# Patient Record
Sex: Male | Born: 1955 | Race: Black or African American | Hispanic: No | Marital: Married | Smoking: Former smoker
Health system: Southern US, Community
[De-identification: ages and names within clinical notes are randomized; demographics above are authoritative.]

## PROBLEM LIST (undated history)

## (undated) DIAGNOSIS — N433 Hydrocele, unspecified: Secondary | ICD-10-CM

## (undated) DIAGNOSIS — E785 Hyperlipidemia, unspecified: Secondary | ICD-10-CM

## (undated) DIAGNOSIS — K602 Anal fissure, unspecified: Secondary | ICD-10-CM

## (undated) DIAGNOSIS — J449 Chronic obstructive pulmonary disease, unspecified: Secondary | ICD-10-CM

## (undated) DIAGNOSIS — I10 Essential (primary) hypertension: Secondary | ICD-10-CM

## (undated) DIAGNOSIS — E119 Type 2 diabetes mellitus without complications: Secondary | ICD-10-CM

## (undated) DIAGNOSIS — T7840XA Allergy, unspecified, initial encounter: Secondary | ICD-10-CM

## (undated) DIAGNOSIS — K589 Irritable bowel syndrome without diarrhea: Secondary | ICD-10-CM

## (undated) HISTORY — DX: Anal fissure, unspecified: K60.2

## (undated) HISTORY — PX: HERNIA REPAIR: SHX51

## (undated) HISTORY — DX: Chronic obstructive pulmonary disease, unspecified: J44.9

## (undated) HISTORY — DX: Allergy, unspecified, initial encounter: T78.40XA

## (undated) HISTORY — PX: VASECTOMY: SHX75

## (undated) HISTORY — DX: Essential (primary) hypertension: I10

## (undated) HISTORY — DX: Hydrocele, unspecified: N43.3

---

## 1997-08-29 ENCOUNTER — Emergency Department (HOSPITAL_COMMUNITY): Admission: EM | Admit: 1997-08-29 | Discharge: 1997-08-29 | Payer: Self-pay | Admitting: Emergency Medicine

## 2000-12-25 ENCOUNTER — Emergency Department (HOSPITAL_COMMUNITY): Admission: EM | Admit: 2000-12-25 | Discharge: 2000-12-25 | Payer: Self-pay

## 2000-12-25 ENCOUNTER — Encounter: Payer: Self-pay | Admitting: Emergency Medicine

## 2001-02-08 ENCOUNTER — Encounter: Admission: RE | Admit: 2001-02-08 | Discharge: 2001-02-08 | Payer: Self-pay | Admitting: Family Medicine

## 2001-02-08 ENCOUNTER — Encounter: Payer: Self-pay | Admitting: Family Medicine

## 2001-03-28 ENCOUNTER — Emergency Department (HOSPITAL_COMMUNITY): Admission: EM | Admit: 2001-03-28 | Discharge: 2001-03-28 | Payer: Self-pay | Admitting: Emergency Medicine

## 2001-03-28 ENCOUNTER — Encounter: Payer: Self-pay | Admitting: Emergency Medicine

## 2001-12-10 ENCOUNTER — Encounter: Admission: RE | Admit: 2001-12-10 | Discharge: 2001-12-10 | Payer: Self-pay | Admitting: *Deleted

## 2002-02-22 ENCOUNTER — Emergency Department (HOSPITAL_COMMUNITY): Admission: EM | Admit: 2002-02-22 | Discharge: 2002-02-22 | Payer: Self-pay | Admitting: Emergency Medicine

## 2002-02-22 ENCOUNTER — Encounter: Payer: Self-pay | Admitting: Emergency Medicine

## 2011-07-10 ENCOUNTER — Other Ambulatory Visit: Payer: Self-pay | Admitting: Internal Medicine

## 2011-07-13 ENCOUNTER — Other Ambulatory Visit: Payer: Self-pay | Admitting: Internal Medicine

## 2011-07-18 ENCOUNTER — Ambulatory Visit: Payer: Self-pay | Admitting: Family Medicine

## 2011-07-18 VITALS — BP 165/93 | HR 66 | Temp 98.2°F | Resp 18 | Ht 68.0 in | Wt 223.0 lb

## 2011-07-18 DIAGNOSIS — I10 Essential (primary) hypertension: Secondary | ICD-10-CM

## 2011-07-18 DIAGNOSIS — E1159 Type 2 diabetes mellitus with other circulatory complications: Secondary | ICD-10-CM | POA: Insufficient documentation

## 2011-07-18 DIAGNOSIS — B356 Tinea cruris: Secondary | ICD-10-CM

## 2011-07-18 DIAGNOSIS — N433 Hydrocele, unspecified: Secondary | ICD-10-CM

## 2011-07-18 DIAGNOSIS — I152 Hypertension secondary to endocrine disorders: Secondary | ICD-10-CM | POA: Insufficient documentation

## 2011-07-18 LAB — POCT GLYCOSYLATED HEMOGLOBIN (HGB A1C): Hemoglobin A1C: 5.7

## 2011-07-18 MED ORDER — LISINOPRIL 40 MG PO TABS: 40.0000 mg | ORAL_TABLET | Freq: Every day | ORAL | Status: DC

## 2011-07-18 NOTE — Patient Instructions (Signed)
Use over-the-counter Lotrimin cream daily for the groin rash.  Hydrocele, Adult Fluid can collect around the testicles. This fluid forms in a sac. This condition is called a hydrocele. The collected fluid causes swelling of the scrotum. Usually, it affects just one testicle. Most of the time, the condition does not cause pain. Sometimes, the hydrocele goes away on its own. Other times, surgery is needed to get rid of the fluid. CAUSES A hydrocele does not develop often. Different things can cause a hydrocele in a man, including:  Injury to the scrotum.   Infection.   X-ray of the area around the scrotum.   A tumor or cancer of the testicle.   Twisting of a testicle.   Decreased blood flow to the scrotum.  SYMPTOMS   Swelling without pain. The hydrocele feels like a water-filled balloon.   Swelling with pain. This can occur if the hydrocele was caused by infection or twisting.   Mild discomfort in the scrotum.   The hydrocele may feel heavy.   Swelling that gets smaller when you lie down.  DIAGNOSIS  Your caregiver will do a physical exam to decide if you have a hydrocele. This may include:  Asking questions about your overall health, today and in the past. Your caregiver may ask about any injuries, X-rays, or infections.   Pushing on your abdomen or asking you to change positions to see if the size of the hydrocele changes.   Shining a light through the scrotum (transillumination) to see if the fluid inside the scrotum is clear.   Blood tests and urine tests to check for infection.   Imaging studies that take pictures of the scrotum and testicles.  TREATMENT  Treatment depends in part on what caused the condition. Options include:  Watchful waiting. Your caregiver checks the hydrocele every so often.   Different surgeries to drain the fluid.   A needle may be put into the scrotum to drain fluid (needle aspiration). Fluid often returns after this type of treatment.    A cut (incision) may be made in the scrotum to remove the fluid sac (hydrocelectomy).   An incision may be made in the groin to repair a hydrocele that has contact with abdominal fluids (communicating hydrocele).   Medicines to treat an infection (antibiotics).  HOME CARE INSTRUCTIONS  What you need to do at home may depend on the cause of the hydrocele and type of treatment. In general:  Take all medicine as directed by your caregiver. Follow the directions carefully.   Ask your caregiver if there is anything you should not do while you recover (activities, lifting, work, sex).   If you had surgery to repair a communicating hydrocele, recovery time may vary. Ask you caregiver about your recovery time.   Avoid heavy lifting for 4 to 6 weeks.   If you had an incision on the scrotum or groin, wash it for 2 to 3 days after surgery. Do this as long as the skin is closed and there are no gaps in the wound. Wash gently, and avoid rubbing the incision.   Keep all follow-up appointments.  SEEK MEDICAL CARE IF:   Your scrotum seems to be getting larger.   The area becomes more and more uncomfortable.  SEEK IMMEDIATE MEDICAL CARE IF:  You have a fever. Document Released: 09/13/2009 Document Revised: 03/15/2011 Document Reviewed: 09/13/2009 [facility] Patient Information 2012 [place], [place].

## 2011-07-18 NOTE — Progress Notes (Signed)
Subjective:  Patient is here for a followup with regard to his high blood pressure and refill of his medication which has run out. The blood pressure is high today, but he says it is because he has been out for one week. He runs a small trucking firm, and travels a lot. He was not in here to get refill.  His hydrocele continues to grow some, and he feels like he needs to go ahead and see a urologist it checked.  The hydrocele causes sweating in his groin region, so he has developed a rash that comes and goes around the sides of the scrotum, medial thighs, and up his crack. He has used diaper rash meds with variable success  Dictated: Afro-American male in no acute distress at this time. Throat clear. Neck supple without nodes or thyromegaly. Chest clear to. Heart regular without murmurs rubs or redness. Evidence of mass or tenderness. Medium sized left hydrocele, small tennis ball. No tenderness. No hernia. Has a little intertrigo-like rash along her left groin and a crack.  Assessment: Hypertension, under satisfactory control secondary to being out of medications Overweight Intertrigo rash Hydrocele  Plan Will refer to urology Refilled blood pressure medicine Use OTC Lotrimin cream. Check blood sugar Results for orders placed in visit on 07/18/11  POCT GLYCOSYLATED HEMOGLOBIN (HGB A1C)      Component Value Range   Hemoglobin A1C 5.7

## 2011-10-08 DIAGNOSIS — Z0271 Encounter for disability determination: Secondary | ICD-10-CM

## 2011-11-22 ENCOUNTER — Telehealth: Payer: Self-pay

## 2011-11-22 NOTE — Telephone Encounter (Signed)
Maxine from [facility] called to see if we received faxed record request. Request in your box. She has some questions. Please call her back at [phone] ext [phone].  Case # [id number].

## 2011-12-31 ENCOUNTER — Ambulatory Visit: Payer: Self-pay | Admitting: Family Medicine

## 2011-12-31 VITALS — BP 160/90 | HR 73 | Temp 98.2°F | Resp 16 | Ht 68.25 in | Wt 226.4 lb

## 2011-12-31 DIAGNOSIS — I10 Essential (primary) hypertension: Secondary | ICD-10-CM

## 2011-12-31 DIAGNOSIS — N476 Balanoposthitis: Secondary | ICD-10-CM

## 2011-12-31 DIAGNOSIS — Z0289 Encounter for other administrative examinations: Secondary | ICD-10-CM

## 2011-12-31 DIAGNOSIS — N481 Balanitis: Secondary | ICD-10-CM

## 2011-12-31 DIAGNOSIS — Z024 Encounter for examination for driving license: Secondary | ICD-10-CM

## 2011-12-31 NOTE — Progress Notes (Signed)
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 He is doing fairly well. No major problems. He had a physical recently his blood pressure was very good then.  Objective: Moderately overweight Afro-American male no acute distress. His TMs are normal. Eyes PERRLA. Throat clear. Neck supple without nodes thyromegaly. No carotid bruits. Chest clear. Heart regular without murmurs. Abdomen soft without masses or tenderness. Normal male external genitalia. Has a little monilial dermatitis on his penis under the foreskin. He has scrotum has bilateral heart chills. Extremities unremarkable.  Assessment: DOTf physical normal Hypertension Monilia balanitis  Plan: The form was completed.  Told to  use Diflucan 150 mg and for his wife to take one. Lotrimin

## 2012-05-19 ENCOUNTER — Ambulatory Visit: Payer: Self-pay | Admitting: Family Medicine

## 2012-05-19 VITALS — BP 179/98 | HR 71 | Temp 97.5°F | Resp 18 | Ht 68.0 in | Wt 228.0 lb

## 2012-05-19 DIAGNOSIS — L089 Local infection of the skin and subcutaneous tissue, unspecified: Secondary | ICD-10-CM

## 2012-05-19 DIAGNOSIS — L723 Sebaceous cyst: Secondary | ICD-10-CM

## 2012-05-19 DIAGNOSIS — I10 Essential (primary) hypertension: Secondary | ICD-10-CM

## 2012-05-19 DIAGNOSIS — J029 Acute pharyngitis, unspecified: Secondary | ICD-10-CM

## 2012-05-19 DIAGNOSIS — K449 Diaphragmatic hernia without obstruction or gangrene: Secondary | ICD-10-CM

## 2012-05-19 MED ORDER — OMEPRAZOLE 40 MG PO CPDR: 40.0000 mg | DELAYED_RELEASE_CAPSULE | Freq: Every day | ORAL | Status: DC

## 2012-05-19 MED ORDER — HYDROCODONE-ACETAMINOPHEN 7.5-325 MG/15ML PO SOLN: 10.0000 mL | Freq: Four times a day (QID) | ORAL | Status: DC | PRN

## 2012-05-19 MED ORDER — CLINDAMYCIN HCL 300 MG PO CAPS: 300.0000 mg | ORAL_CAPSULE | Freq: Four times a day (QID) | ORAL | Status: DC

## 2012-05-19 NOTE — Progress Notes (Signed)
Subjective:    Patient ID: Larry Dawson, male    DOB: October 14, 1955, 57 y.o.   MRN: [id number] Chief Complaint  Patient presents with  . URI    x 2 weeks and also  luq empty feeling like an ache    HPI Larry Dawson has had URI sxs for the past 2 wks. Several days ago he thought he was getting better but was then exposed to some extreme temperature changes and pharyngitis and throat swelling returned dramatically worse.  last night he did not sleep AT ALL as his uvula swells at night and when he lays back he feels like he can't breathe - swells really large and to get it to go down will hold a bc powder in the back of his mouth. So basically hasn't slept at all in 3d due to sxs. Throat pain is "killing" him.  Taking otc off-brand nyquil also. No longer with sig nasal cong and cough resolved.  No fevers but has had A LOT of sweats.  When he gets hungry he feels the typical hunger pangs but they seem to be coming from his left chest and hears his stomach growl from his left chest for the past several mos.  For the past yr, he has been experiencing so mild early satiety with regurg if he eats to much. Ate about 7 hrs ago and now hearing left chest sounds and having hunger pangs in his left chest.  Reports his BP is likely up because he has not slept in 3d. His daughters are CNAs who check his BP regularly and usu runs 130s/80s.  Incidentally on exam, I saw an inflammed nodule that due to the central pin-point postule I suspect is an infected/inflammed sebaceous cyst.  Pt states that this just developed within the past 2-3 hrs - did not have ANY lesion there prior today - just all of a sudden started to swell up and become painful. Pt has never had anything like this prior, has never needed an I&D.  Past Medical History  Diagnosis Date  . Allergy    Current Outpatient Prescriptions on File Prior to Visit  Medication Sig Dispense Refill  . lisinopril (PRINIVIL,ZESTRIL) 40 MG tablet Take 1 tablet (40 mg  total) by mouth daily.  90 tablet  3   No current facility-administered medications on file prior to visit.   Allergies  Allergen Reactions  . Latex   . Penicillins     Review of Systems  Constitutional: Positive for chills, diaphoresis and fatigue. Negative for fever, activity change, appetite change and unexpected weight change.  HENT: Positive for sore throat, rhinorrhea and trouble swallowing. Negative for ear pain, congestion, drooling, mouth sores, neck pain, neck stiffness, postnasal drip and sinus pressure.   Respiratory: Negative for cough and shortness of breath.   Cardiovascular: Positive for chest pain.  Gastrointestinal: Positive for abdominal pain. Negative for nausea, vomiting, diarrhea and constipation.  Genitourinary: Negative for dysuria.  Musculoskeletal: Negative for myalgias and arthralgias.  Skin: Negative for rash.  Neurological: Positive for headaches. Negative for syncope.  Hematological: Negative for adenopathy.  Psychiatric/Behavioral: Positive for sleep disturbance.       BP 179/98  Pulse 71  Temp(Src) 97.5 F (36.4 C) (Oral)  Resp 18  Ht 5\' 8"  (1.727 m)  Wt 228 lb (103.42 kg)  BMI 34.68 kg/m2  SpO2 98% Objective:   Physical Exam  Constitutional: He is oriented to person, place, and time. He appears well-developed and well-nourished. No distress.  HENT:  Head: Normocephalic and atraumatic. No trismus in the jaw.  Right Ear: Tympanic membrane, external ear and ear canal normal.  Left Ear: Tympanic membrane, external ear and ear canal normal.  Nose: Nose normal.  Mouth/Throat: Uvula is midline and mucous membranes are normal. No edematous. Posterior oropharyngeal erythema present. No oropharyngeal exudate, posterior oropharyngeal edema or tonsillar abscesses.  Eyes: Conjunctivae are normal. No scleral icterus.  Neck: Normal range of motion. Neck supple. No thyromegaly present.    Cardiovascular: Normal rate, regular rhythm, normal heart sounds  and intact distal pulses.   Pulmonary/Chest: Effort normal and breath sounds normal. No respiratory distress.  Musculoskeletal: He exhibits no edema.  Lymphadenopathy:       Head (right side): Tonsillar adenopathy present. No submental, no submandibular, no preauricular, no posterior auricular and no occipital adenopathy present.       Head (left side): Tonsillar adenopathy present. No submental, no submandibular, no preauricular, no posterior auricular and no occipital adenopathy present.    He has no cervical adenopathy.       Right cervical: No superficial cervical and no posterior cervical adenopathy present.      Left cervical: No superficial cervical and no posterior cervical adenopathy present.       Right: No supraclavicular adenopathy present.       Left: No supraclavicular adenopathy present.  Neurological: He is alert and oriented to person, place, and time.  Skin: Skin is warm and dry. He is not diaphoretic.  Right lower posterior neck with red tender nodule of approx 1 cm dm with central pinpoint pustule, no sig warmth, surrounding induration of 2 cm dm total.  Psychiatric: He has a normal mood and affect. His behavior is normal.       Assessment & Plan:   1. Hiatal hernia - suspected by complaints today but not confirmed. i advised pt to hold off on further eval with imaging/endoscopy/GI referral for now as he is living with sxs w/o sig morbidity and surgery is likely not a viable option for pt at this point due to cost. Start on ppi to see if we can reduce his early satiety and regurg but if sxs persist, will need to rtc for further eval - likely start off w/ xray. omeprazole (PRILOSEC) 40 MG capsule   omeprazole (PRILOSEC) 40 MG capsule  2. Acute pharyngitis - gargle w/ salt water, hot tea w/ lemon and honey, steam clindamycin (CLEOCIN) 300 MG capsule   clindamycin (CLEOCIN) 300 MG capsule   HYDROcodone-acetaminophen (HYCET) 7.5-325 mg/15 ml solution  3. Infected sebaceous  cyst - warm compresses to encourage drainage. RTC for I&D if continues to enlarge but this looks like it is going to drain spontaneously.  Will cover pharyngitis w/ clinda so that will hopefully cover this skin infection/poss MRSA abscess as well. clindamycin (CLEOCIN) 300 MG capsule   clindamycin (CLEOCIN) 300 MG capsule  4. Unspecified essential hypertension  Elev BP as pt has not slept in several days. Cont to monitor BP at home and call for med adjustment (add hctz) if >140/90 after he is feeling better.  On lisinopril - due for bmp before refill but held off today since no insurance.   Meds ordered this encounter  Medications  . clindamycin (CLEOCIN) 300 MG capsule    Sig: Take 1 capsule (300 mg total) by mouth 4 (four) times daily.    Dispense:  28 capsule    Refill:  0  . HYDROcodone-acetaminophen (HYCET) 7.5-325 mg/15 ml solution  Sig: Take 10-15 mLs by mouth every 6 (six) hours as needed for pain.    Dispense:  140 mL    Refill:  0  . omeprazole (PRILOSEC) 40 MG capsule    Sig: Take 1 capsule (40 mg total) by mouth daily.    Dispense:  30 capsule    Refill:  3

## 2012-05-19 NOTE — Patient Instructions (Addendum)
DASH Diet The DASH diet stands for "Dietary Approaches to Stop Hypertension." It is a healthy eating plan that has been shown to reduce high blood pressure (hypertension) in as little as 14 days, while also possibly providing other significant health benefits. These other health benefits include reducing the risk of breast cancer after menopause and reducing the risk of type 2 diabetes, heart disease, colon cancer, and stroke. Health benefits also include weight loss and slowing kidney failure in patients with chronic kidney disease.  DIET GUIDELINES  Limit salt (sodium). Your diet should contain less than 1500 mg of sodium daily.  Limit refined or processed carbohydrates. Your diet should include mostly whole grains. Desserts and added sugars should be used sparingly.  Include small amounts of heart-healthy fats. These types of fats include nuts, oils, and tub margarine. Limit saturated and trans fats. These fats have been shown to be harmful in the body. CHOOSING FOODS  The following food groups are based on a 2000 calorie diet. See your Registered Dietitian for individual calorie needs. Grains and Grain Products (6 to 8 servings daily)  Eat More Often: Whole-wheat bread, brown rice, whole-grain or wheat pasta, quinoa, popcorn without added fat or salt (air popped).  Eat Less Often: White bread, white pasta, white rice, cornbread. Vegetables (4 to 5 servings daily)  Eat More Often: Fresh, frozen, and canned vegetables. Vegetables may be raw, steamed, roasted, or grilled with a minimal amount of fat.  Eat Less Often/Avoid: Creamed or fried vegetables. Vegetables in a cheese sauce. Fruit (4 to 5 servings daily)  Eat More Often: All fresh, canned (in natural juice), or frozen fruits. Dried fruits without added sugar. One hundred percent fruit juice ( cup [237 mL] daily).  Eat Less Often: Dried fruits with added sugar. Canned fruit in light or heavy syrup. [employer], Fish, and Poultry (2  servings or less daily. One serving is 3 to 4 oz [85-114 g]).  Eat More Often: Ninety percent or leaner ground beef, tenderloin, sirloin. Round cuts of beef, chicken breast, [place] breast. All fish. Grill, bake, or broil your meat. Nothing should be fried.  Eat Less Often/Avoid: Fatty cuts of meat, [place], or chicken leg, thigh, or wing. Fried cuts of meat or fish. Dairy (2 to 3 servings)  Eat More Often: Low-fat or fat-free milk, low-fat plain or light yogurt, reduced-fat or part-skim cheese.  Eat Less Often/Avoid: Milk (whole, 2%).Whole milk yogurt. Full-fat cheeses. Nuts, Seeds, and Legumes (4 to 5 servings per week)  Eat More Often: All without added salt.  Eat Less Often/Avoid: Salted nuts and seeds, canned beans with added salt. Fats and Sweets (limited)  Eat More Often: Vegetable oils, tub margarines without trans fats, sugar-free gelatin. Mayonnaise and salad dressings.  Eat Less Often/Avoid: Coconut oils, palm oils, butter, stick margarine, cream, half and half, cookies, candy, pie. FOR MORE INFORMATION The Dash Diet Eating Plan: www.dashdiet.org Document Released: 03/15/2011 Document Revised: 06/18/2011 Document Reviewed: 03/15/2011 [facility] Patient Information 2013 [place], [place].  Clindamycin capsules What is this medicine? CLINDAMYCIN (KLIN da MYE sin) is a lincosamide antibiotic. It is used to treat certain kinds of bacterial infections. It will not work for colds, flu, or other viral infections. This medicine may be used for other purposes; ask your health care provider or pharmacist if you have questions. What should I tell my health care provider before I take this medicine? They need to know if you have any of these conditions: -kidney disease -liver disease -stomach problems like colitis -  an unusual or allergic reaction to clindamycin, lincomycin, or other medicines, foods, dyes like tartrazine or preservatives -pregnant or trying to get  pregnant -breast-feeding How should I use this medicine? Take this medicine by mouth with a full glass of water. Follow the directions on the prescription label. You can take this medicine with food or on an empty stomach. If the medicine upsets your stomach, take it with food. Take your medicine at regular intervals. Do not take your medicine more often than directed. Take all of your medicine as directed even if you think your are better. Do not skip doses or stop your medicine early. Talk to your pediatrician regarding the use of this medicine in children. Special care may be needed. Overdosage: If you think you have taken too much of this medicine contact a poison control center or emergency room at once. NOTE: This medicine is only for you. Do not share this medicine with others. What if I miss a dose? If you miss a dose, take it as soon as you can. If it is almost time for your next dose, take only that dose. Do not take double or extra doses. What may interact with this medicine? -chloramphenicol -erythromycin -kaolin products This list may not describe all possible interactions. Give your health care provider a list of all the medicines, herbs, non-prescription drugs, or dietary supplements you use. Also tell them if you smoke, drink alcohol, or use illegal drugs. Some items may interact with your medicine. What should I watch for while using this medicine? Tell your doctor or healthcare professional if your symptoms do not start to get better or if they get worse. Do not treat diarrhea with over the counter products. Contact your doctor if you have diarrhea that lasts more than 2 days or if it is severe and watery. What side effects may I notice from receiving this medicine? Side effects that you should report to your doctor or health care professional as soon as possible: -allergic reactions like skin rash, itching or hives, swelling of the face, lips, or tongue -dark urine -pain on  swallowing -redness, blistering, peeling or loosening of the skin, including inside the mouth -unusual bleeding or bruising -unusually weak or tired -yellowing of eyes or skin Side effects that usually do not require medical attention (report to your doctor or health care professional if they continue or are bothersome): -diarrhea -itching in the rectal or genital area -joint pain -nausea, vomiting -stomach pain This list may not describe all possible side effects. Call your doctor for medical advice about side effects. You may report side effects to FDA at 1-800-FDA-1088. Where should I keep my medicine? Keep out of the reach of children. Store at room temperature between 20 and 25 degrees C (68 and 77 degrees F). Throw away any unused medicine after the expiration date. NOTE: This sheet is a summary. It may not cover all possible information. If you have questions about this medicine, talk to your doctor, pharmacist, or health care provider.  2012, [employer]. (08/17/2009 10:12:31 AM)

## 2012-09-18 ENCOUNTER — Other Ambulatory Visit: Payer: Self-pay | Admitting: Family Medicine

## 2012-11-30 ENCOUNTER — Ambulatory Visit: Payer: Self-pay | Admitting: Family Medicine

## 2012-11-30 VITALS — BP 181/115 | HR 76 | Ht 68.0 in | Wt 228.0 lb

## 2012-11-30 DIAGNOSIS — I10 Essential (primary) hypertension: Secondary | ICD-10-CM

## 2012-11-30 DIAGNOSIS — J449 Chronic obstructive pulmonary disease, unspecified: Secondary | ICD-10-CM | POA: Insufficient documentation

## 2012-11-30 DIAGNOSIS — Z79899 Other long term (current) drug therapy: Secondary | ICD-10-CM

## 2012-11-30 DIAGNOSIS — L723 Sebaceous cyst: Secondary | ICD-10-CM | POA: Insufficient documentation

## 2012-11-30 DIAGNOSIS — M545 Low back pain: Secondary | ICD-10-CM

## 2012-11-30 LAB — BASIC METABOLIC PANEL
BUN: 14 mg/dL (ref 6–23)
CO2: 29 mEq/L (ref 19–32)
Chloride: 104 mEq/L (ref 96–112)
Glucose, Bld: 96 mg/dL (ref 70–99)
Potassium: 4.4 mEq/L (ref 3.5–5.3)

## 2012-11-30 LAB — LIPID PANEL
Cholesterol: 172 mg/dL (ref 0–200)
LDL Cholesterol: 106 mg/dL — ABNORMAL HIGH (ref 0–99)
Total CHOL/HDL Ratio: 3.6 Ratio
VLDL: 18 mg/dL (ref 0–40)

## 2012-11-30 MED ORDER — MELOXICAM 15 MG PO TABS: 15.0000 mg | ORAL_TABLET | Freq: Every day | ORAL | Status: DC | PRN

## 2012-11-30 MED ORDER — LISINOPRIL 40 MG PO TABS: 40.0000 mg | ORAL_TABLET | Freq: Every day | ORAL | Status: DC

## 2012-11-30 MED ORDER — CYCLOBENZAPRINE HCL 10 MG PO TABS: 10.0000 mg | ORAL_TABLET | Freq: Three times a day (TID) | ORAL | Status: DC | PRN

## 2012-11-30 MED ORDER — ALBUTEROL SULFATE HFA 108 (90 BASE) MCG/ACT IN AERS: 2.0000 | INHALATION_SPRAY | RESPIRATORY_TRACT | Status: DC | PRN

## 2012-11-30 MED ORDER — ATENOLOL-CHLORTHALIDONE 50-25 MG PO TABS: 1.0000 | ORAL_TABLET | Freq: Every day | ORAL | Status: DC

## 2012-11-30 MED ORDER — DOXYCYCLINE HYCLATE 100 MG PO CAPS: 100.0000 mg | ORAL_CAPSULE | Freq: Two times a day (BID) | ORAL | Status: DC

## 2012-11-30 NOTE — Progress Notes (Deleted)
Commercial Driver Medical Examination   Larry Dawson is a 57 y.o. male who presents today for a commercial driver fitness determination physical exam. The patient reports {problems:16946::"no problems"}. {Common ambulatory SmartLinks:19316} Review of Systems {ros - complete:30496}   Objective:    Vision:  Uncorrected Corrected Horizontal Field of Vision  Right Eye {result:19455::"20/20"} {result:19455::"20/20"} *** degrees  Left Eye  {result:19455::"20/20"} {result:19455::"20/20"} *** degrees  Both Eyes  {result:19455::"20/20"} {result:19455::"20/20"}    Applicant {can/cannot:17900::"can"} recognize and distinguish among traffic control signals and devices showing standard red, green, and amber colors.  {Corrective lens required?:19458::" "}  Monocular Vision?: {Yes/No:18319::"No"}   Hearing:   500 Hz 1000 Hz 2000 Hz 4000 Hz  Right Ear  {result:19456} {result:19456} {result:19456} {result:19456}  Left Ear  {result:19456} {result:19456} {result:19456} {result:19456}   {Hearing aid requirement:19457::" "}  {Exam, Complete:17964}  Labs: No results found for this basename: SPECGRAV, PROTEINUR, BILIRUBINUR, GLUCOSEU      Assessment:    Healthy male exam.  {Determination:19453}    Plan:    {Plan:19460::"Medical examiners certificate completed and printed.","Return as needed."}
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
## 2012-11-30 NOTE — Patient Instructions (Addendum)
Check your blood pressure at home. When it is consistently running less than 140/90, come back for your DOT exam.  If it is higher than this, let us know so we can start additional BP medication.  Try the qvar as needed for shortness of breath.  There is an albuterol rx at your pharmacy which is likely to help more but is expensive. Use these as needed for cough or shortness of breath.  I recommend keeping meloxicam on board - esp taking before work. Then when you come home, take a muscle relaxant followed by 15 minutes of heat followed by gentle stretching.  If you are still having pain in 4 to 6 wks, come back to clinic for further eval. RTC immed if symptoms worsen or you develop any other concerning symptoms below.   Foods Rich in Potassium Food / Potassium (mg)  Apricots, dried,  cup / 378 mg   Apricots, raw, 1 cup halves / 401 mg   Avocado,  / 487 mg   Banana, 1 large / 487 mg   Beef, lean, round, 3 oz / 202 mg   Cantaloupe, 1 cup cubes / 427 mg   Dates, medjool, 5 whole / 835 mg   Ham, cured, 3 oz / 212 mg   Lentils, dried,  cup / 458 mg   Lima beans, frozen,  cup / 258 mg   Orange, 1 large / 333 mg   Orange juice, 1 cup / 443 mg   Peaches, dried,  cup / 398 mg   Peas, split, cooked,  cup / 355 mg   Potato, boiled, 1 medium / 515 mg   Prunes, dried, uncooked,  cup / 318 mg   Raisins,  cup / 309 mg   Salmon, pink, raw, 3 oz / 275 mg   Sardines, canned , 3 oz / 338 mg   Tomato, raw, 1 medium / 292 mg   Tomato juice, 6 oz / 417 mg   [place], 3 oz / 349 mg  Document Released: 03/26/2005 Document Revised: 12/06/2010 Document Reviewed: 08/09/2008 [facility] Patient Information 2012 [place], [place].

## 2012-11-30 NOTE — Progress Notes (Signed)
Procedure:  Consent obtained.  Local anesthesia with 2% lido with epi - #11 blade used to make 1/2cm incision and purulence expressed.  1/4 in plain packing into the wound.  Drsg placed.  Wound care d/w pt.  If the packing does not fall out by Wed pt can pull out at that time.

## 2012-12-01 ENCOUNTER — Encounter: Payer: Self-pay | Admitting: Family Medicine

## 2012-12-31 ENCOUNTER — Ambulatory Visit: Payer: Self-pay | Admitting: Family Medicine

## 2012-12-31 VITALS — BP 130/80 | HR 53 | Temp 98.5°F | Resp 18 | Ht 68.0 in | Wt 230.0 lb

## 2012-12-31 DIAGNOSIS — J449 Chronic obstructive pulmonary disease, unspecified: Secondary | ICD-10-CM

## 2012-12-31 DIAGNOSIS — Z0289 Encounter for other administrative examinations: Secondary | ICD-10-CM

## 2012-12-31 DIAGNOSIS — I10 Essential (primary) hypertension: Secondary | ICD-10-CM

## 2012-12-31 NOTE — Progress Notes (Signed)
[address]   [place], [place]  [address]   [phone]  Subjective:    Patient ID: Larry Dawson, male    DOB: 01-27-1956, 57 y.o.   MRN: [id number]  HPI This 57 y.o. male presents for evaluation for DOT CPE.  Last DOT CPE 11/2011.   Review of Systems  Constitutional: Negative for fever, chills, diaphoresis and fatigue.  HENT: Negative for hearing loss, ear pain and sore throat.   Eyes: Negative for photophobia.  Respiratory: Negative for cough, shortness of breath, wheezing and stridor.   Cardiovascular: Negative for chest pain, palpitations and leg swelling.  Gastrointestinal: Negative for nausea, vomiting, abdominal pain, diarrhea and constipation.  Endocrine: Negative for polyphagia and polyuria.  Genitourinary: Positive for scrotal swelling. Negative for frequency, decreased urine volume, penile swelling and testicular pain.  Musculoskeletal: Positive for back pain. Negative for myalgias, joint swelling, arthralgias and gait problem.  Skin: Negative for color change, pallor, rash and wound.  Neurological: Negative for dizziness, seizures, speech difficulty, weakness, light-headedness, numbness and headaches.  Psychiatric/Behavioral: Negative for sleep disturbance, dysphoric mood and decreased concentration. The patient is not nervous/anxious.     Past Medical History  Diagnosis Date  . Allergy   . Hypertension   . Hydrocele   . COPD (chronic obstructive pulmonary disease)     Albuterol PRN.    Past Surgical History  Procedure Laterality Date  . Hernia repair      Prior to Admission medications   Medication Sig Start Date End Date Taking? Authorizing Provider  albuterol (PROVENTIL HFA;VENTOLIN HFA) 108 (90 BASE) MCG/ACT inhaler Inhale 2 puffs into the lungs every 4 (four) hours as needed for wheezing (cough, shortness of breath or wheezing.). 11/30/12  Yes Sherren Mocha, MD  atenolol-chlorthalidone (TENORETIC) 50-25 MG per tablet Take 1 tablet by mouth daily. 11/30/12   Yes Sherren Mocha, MD  cyclobenzaprine (FLEXERIL) 10 MG tablet Take 1 tablet (10 mg total) by mouth 3 (three) times daily as needed for muscle spasms. 11/30/12  Yes Sherren Mocha, MD  doxycycline (VIBRAMYCIN) 100 MG capsule Take 1 capsule (100 mg total) by mouth 2 (two) times daily. 11/30/12  Yes Sherren Mocha, MD  lisinopril (PRINIVIL,ZESTRIL) 40 MG tablet Take 1 tablet (40 mg total) by mouth daily. 11/30/12  Yes Sherren Mocha, MD  meloxicam (MOBIC) 15 MG tablet Take 1 tablet (15 mg total) by mouth daily as needed for pain. 11/30/12  Yes Sherren Mocha, MD  omeprazole (PRILOSEC) 40 MG capsule Take 1 capsule (40 mg total) by mouth daily. 05/19/12  Yes Sherren Mocha, MD    Allergies  Allergen Reactions  . Latex   . Penicillins     History   Social History  . Marital Status: Married    Spouse Name: N/A    Number of Children: N/A  . Years of Education: N/A   Occupational History  . Not on file.   Social History Main Topics  . Smoking status: Former Games developer  . Smokeless tobacco: Never Used  . Alcohol Use: No  . Drug Use: No  . Sexual Activity: Not on file   Other Topics Concern  . Not on file   Social History Narrative   Marital status: married      Employment: [employer]; drives all over.  Owns company.      Tobacco:  Quit smoking in 2005; smoked 25 years      Alcohol:  Rare beer  Drugs:  None      Exercise:  none    Family History  Problem Relation Age of Onset  . Diabetes Father   . Hypertension Father   . Diabetes Brother   . Hypertension Brother   . Diabetes Brother   . Diabetes Brother        Objective:   Physical Exam  Nursing note and vitals reviewed. Constitutional: He is oriented to person, place, and time. He appears well-developed and well-nourished.  HENT:  Head: Normocephalic and atraumatic.  Right Ear: External ear normal.  Left Ear: External ear normal.  Nose: Nose normal.  Mouth/Throat: Oropharynx is clear and moist.  Eyes: Conjunctivae and EOM are  normal. Pupils are equal, round, and reactive to light.  Neck: Normal range of motion. Neck supple. No thyromegaly present.  Cardiovascular: Normal rate, regular rhythm, normal heart sounds and intact distal pulses.  Exam reveals no gallop and no friction rub.   No murmur heard. Pulmonary/Chest: Effort normal and breath sounds normal. He has no wheezes. He has no rales.  Abdominal: Soft. Bowel sounds are normal. He exhibits no distension and no mass. There is no tenderness. There is no rebound and no guarding. Hernia confirmed negative in the right inguinal area and confirmed negative in the left inguinal area.  Genitourinary: Penis normal. Right testis shows swelling. Left testis shows swelling. Circumcised.  Musculoskeletal:       Right shoulder: Normal.       Left shoulder: Normal.       Right knee: Normal.       Left knee: Normal.       Cervical back: Normal.       Thoracic back: Normal.       Lumbar back: Normal.  Lymphadenopathy:    He has no cervical adenopathy.       Right: No inguinal adenopathy present.       Left: No inguinal adenopathy present.  Neurological: He is alert and oriented to person, place, and time. He has normal reflexes. No cranial nerve deficit. He exhibits normal muscle tone. Coordination normal.  Skin: Skin is warm and dry. No rash noted. No erythema. No pallor.  Psychiatric: He has a normal mood and affect. His behavior is normal. Judgment and thought content normal.       Assessment & Plan:  Health examination of defined subpopulation  1. DOT self pay CPE:  One year card provided due to HTN, COPD.   2. HTN: controlled on current regimen; one year card. 3.  COPD: Mild; continue Albuterol PRN.  Stopped smoking 12 years ago.

## 2013-02-22 ENCOUNTER — Ambulatory Visit: Payer: Self-pay | Admitting: Emergency Medicine

## 2013-02-22 VITALS — BP 112/70 | HR 76 | Temp 98.2°F | Resp 16 | Ht 68.5 in | Wt 228.0 lb

## 2013-02-22 DIAGNOSIS — R197 Diarrhea, unspecified: Secondary | ICD-10-CM

## 2013-02-22 LAB — COMPREHENSIVE METABOLIC PANEL
AST: 17 U/L (ref 0–37)
Alkaline Phosphatase: 51 U/L (ref 39–117)
BUN: 11 mg/dL (ref 6–23)
Calcium: 9.3 mg/dL (ref 8.4–10.5)
Creat: 1.17 mg/dL (ref 0.50–1.35)
Glucose, Bld: 119 mg/dL — ABNORMAL HIGH (ref 70–99)
Total Bilirubin: 0.5 mg/dL (ref 0.3–1.2)

## 2013-02-22 LAB — POCT CBC
Granulocyte percent: 48.9 %G (ref 37–80)
Lymph, poc: 2.8 (ref 0.6–3.4)
MID (cbc): 0.5 (ref 0–0.9)
MPV: 8.4 fL (ref 0–99.8)
POC Granulocyte: 3.1 (ref 2–6.9)
POC LYMPH PERCENT: 43.8 %L (ref 10–50)
POC MID %: 7.3 %M (ref 0–12)
Platelet Count, POC: 377 10*3/uL (ref 142–424)
RBC: 5.02 M/uL (ref 4.69–6.13)

## 2013-02-22 MED ORDER — LOPERAMIDE HCL 2 MG PO TABS: 2.0000 mg | ORAL_TABLET | Freq: Four times a day (QID) | ORAL | Status: DC | PRN

## 2013-02-22 NOTE — Patient Instructions (Signed)
Diarrhea Diarrhea is frequent loose and watery bowel movements. It can cause you to feel weak and dehydrated. Dehydration can cause you to become tired and thirsty, have a dry mouth, and have decreased urination that often is dark yellow. Diarrhea is a sign of another problem, most often an infection that will not last long. In most cases, diarrhea typically lasts 2 3 days. However, it can last longer if it is a sign of something more serious. It is important to treat your diarrhea as directed by your caregive to lessen or prevent future episodes of diarrhea. CAUSES  Some common causes include:  Gastrointestinal infections caused by viruses, bacteria, or parasites.  Food poisoning or food allergies.  Certain medicines, such as antibiotics, chemotherapy, and laxatives.  Artificial sweeteners and fructose.  Digestive disorders. HOME CARE INSTRUCTIONS  Ensure adequate fluid intake (hydration): have 1 cup (8 oz) of fluid for each diarrhea episode. Avoid fluids that contain simple sugars or sports drinks, fruit juices, whole milk products, and sodas. Your urine should be clear or pale yellow if you are drinking enough fluids. Hydrate with an oral rehydration solution that you can purchase at pharmacies, retail stores, and online. You can prepare an oral rehydration solution at home by mixing the following ingredients together:    tsp table salt.   tsp baking soda.   tsp salt substitute containing potassium chloride.  1  tablespoons sugar.  1 L (34 oz) of water.  Certain foods and beverages may increase the speed at which food moves through the gastrointestinal (GI) tract. These foods and beverages should be avoided and include:  Caffeinated and alcoholic beverages.  High-fiber foods, such as raw fruits and vegetables, nuts, seeds, and whole grain breads and cereals.  Foods and beverages sweetened with sugar alcohols, such as xylitol, sorbitol, and mannitol.  Some foods may be well  tolerated and may help thicken stool including:  Starchy foods, such as rice, toast, pasta, low-sugar cereal, oatmeal, grits, baked potatoes, crackers, and bagels.  Bananas.  Applesauce.  Add probiotic-rich foods to help increase healthy bacteria in the GI tract, such as yogurt and fermented milk products.  Wash your hands well after each diarrhea episode.  Only take over-the-counter or prescription medicines as directed by your caregiver.  Take a warm bath to relieve any burning or pain from frequent diarrhea episodes. SEEK IMMEDIATE MEDICAL CARE IF:   You are unable to keep fluids down.  You have persistent vomiting.  You have blood in your stool, or your stools are black and tarry.  You do not urinate in 6 8 hours, or there is only a small amount of very dark urine.  You have abdominal pain that increases or localizes.  You have weakness, dizziness, confusion, or lightheadedness.  You have a severe headache.  Your diarrhea gets worse or does not get better.  You have a fever or persistent symptoms for more than 2 3 days.  You have a fever and your symptoms suddenly get worse. MAKE SURE YOU:   Understand these instructions.  Will watch your condition.  Will get help right away if you are not doing well or get worse. Document Released: 03/16/2002 Document Revised: 03/12/2012 Document Reviewed: 12/02/2011 [employer] Patient Information 2014 [employer].  

## 2013-02-22 NOTE — Progress Notes (Signed)
[facility] [address] [place] [address] [phone]  Date:  02/22/2013   Name:  Larry Dawson   DOB:  [birth year]   MRN:  [id number]  PCP:  Tally Due, MD    Chief Complaint: Diarrhea   History of Present Illness:  Larry Dawson is a 57 y.o. very pleasant male patient who presents with the following:  Travelled to [facility] a month ago and on return has experienced frequent loose stools with mucous.  Says he moves his bowels every 2-3 hours. No blood in stool.  No fever or chills.  No weight loss or nausea or vomiting. No rash.  No sketchy water.  No sick contacts.  No colonoscopy.  Diarrhea interrupts sleep.  No history of IBD in family.  No improvement with over the counter medications or other home remedies. Denies other complaint or health concern today.   Patient Active Problem List   Diagnosis Date Noted  . Sebaceous cyst 11/30/2012  . COPD (chronic obstructive pulmonary disease) 11/30/2012  . Balanitis 12/31/2011  . HTN (hypertension) 07/18/2011    Past Medical History  Diagnosis Date  . Allergy   . Hypertension   . Hydrocele   . COPD (chronic obstructive pulmonary disease)     Albuterol PRN.    Past Surgical History  Procedure Laterality Date  . Hernia repair      History  Substance Use Topics  . Smoking status: Former Games developer  . Smokeless tobacco: Never Used  . Alcohol Use: No    Family History  Problem Relation Age of Onset  . Diabetes Father   . Hypertension Father   . Diabetes Brother   . Hypertension Brother   . Diabetes Brother   . Diabetes Brother     Allergies  Allergen Reactions  . Latex   . Penicillins     Medication list has been reviewed and updated.  Current Outpatient Prescriptions on File Prior to Visit  Medication Sig Dispense Refill  . albuterol (PROVENTIL HFA;VENTOLIN HFA) 108 (90 BASE) MCG/ACT inhaler Inhale 2 puffs into the lungs every 4 (four) hours as needed for wheezing (cough,  shortness of breath or wheezing.).  1 Inhaler  1  . atenolol-chlorthalidone (TENORETIC) 50-25 MG per tablet Take 1 tablet by mouth daily.  90 tablet  1  . doxycycline (VIBRAMYCIN) 100 MG capsule Take 1 capsule (100 mg total) by mouth 2 (two) times daily.  20 capsule  0  . lisinopril (PRINIVIL,ZESTRIL) 40 MG tablet Take 1 tablet (40 mg total) by mouth daily.  90 tablet  1  . cyclobenzaprine (FLEXERIL) 10 MG tablet Take 1 tablet (10 mg total) by mouth 3 (three) times daily as needed for muscle spasms.  30 tablet  1  . meloxicam (MOBIC) 15 MG tablet Take 1 tablet (15 mg total) by mouth daily as needed for pain.  30 tablet  3  . omeprazole (PRILOSEC) 40 MG capsule Take 1 capsule (40 mg total) by mouth daily.  30 capsule  3   No current facility-administered medications on file prior to visit.    Review of Systems:  As per HPI, otherwise negative.    Physical Examination: Filed Vitals:   02/22/13 0912  BP: 112/70  Pulse: 76  Temp: 98.2 F (36.8 C)  Resp: 16   Filed Vitals:   02/22/13 0912  Height: 5' 8.5" (1.74 m)  Weight: 228 lb (103.42 kg)   Body mass index is 34.16 kg/(m^2). Ideal  Body Weight: Weight in (lb) to have BMI = 25: 166.5  GEN: WDWN, NAD, Non-toxic, A & O x 3 HEENT: Atraumatic, Normocephalic. Neck supple. No masses, No LAD. Ears and Nose: No external deformity. CV: RRR, No M/G/R. No JVD. No thrill. No extra heart sounds. PULM: CTA B, no wheezes, crackles, rhonchi. No retractions. No resp. distress. No accessory muscle use. ABD: S, NT, ND, +BS. No rebound. No HSM. EXTR: No c/c/e NEURO Normal gait.  PSYCH: Normally interactive. Conversant. Not depressed or anxious appearing.  Calm demeanor.  DRE:  Refused due concerns about incontinence  Assessment and Plan: Diarrhea Labs GI consult   Signed,  Phillips Odor, MD

## 2013-02-25 LAB — STOOL CULTURE

## 2013-03-23 ENCOUNTER — Encounter: Payer: Self-pay | Admitting: Gastroenterology

## 2013-04-10 ENCOUNTER — Ambulatory Visit (INDEPENDENT_AMBULATORY_CARE_PROVIDER_SITE_OTHER): Payer: BC Managed Care – PPO | Admitting: Gastroenterology

## 2013-04-10 ENCOUNTER — Encounter: Payer: Self-pay | Admitting: Gastroenterology

## 2013-04-10 VITALS — BP 134/80 | HR 60 | Ht 68.5 in | Wt 232.0 lb

## 2013-04-10 DIAGNOSIS — Z1211 Encounter for screening for malignant neoplasm of colon: Secondary | ICD-10-CM

## 2013-04-10 DIAGNOSIS — R197 Diarrhea, unspecified: Secondary | ICD-10-CM

## 2013-04-10 MED ORDER — MOVIPREP 100 G PO SOLR: ORAL | Status: DC

## 2013-04-10 NOTE — Patient Instructions (Addendum)
Try stopping the imodium for several days.  Your recent diarrhea was likely infection related and has probably resolved. You will be set up for a colonoscopy for screening for colon cancer.  You have been scheduled for a colonoscopy with propofol. Please follow written instructions given to you at your visit today.  Please pick up your prep kit at the pharmacy within the next 1-3 days. If you use inhalers (even only as needed), please bring them with you on the day of your procedure.

## 2013-04-10 NOTE — Progress Notes (Signed)
HPI: This is a   very pleasant 58 year old man whom I am meeting for the first time today.  Had diarrhrea.  Has been taking imodium and this helps.  One pill once per day.  He drives trucks.  Has had hemorrhoidal issues in the past.  Worse as he has been getting older.  Stool testing last month.  During diarrhea which lasted over a month, was going 4-5 times per day. ++ nocturnal.  Never bloody.    Prior to the diarrhea he was relatively constipated since 58 years old.  Overall his weight has been down 6 poiunds, intentionally.  Never had colonoscopy.    No colitis, no colon cancer in his family.    Review of systems: Pertinent positive and negative review of systems were noted in the above HPI section. Complete review of systems was performed and was otherwise normal.    Past Medical History  Diagnosis Date  . Allergy   . Hypertension   . Hydrocele   . COPD (chronic obstructive pulmonary disease)     Albuterol PRN.  Marland Kitchen. Anal fissure     Past Surgical History  Procedure Laterality Date  . Hernia repair      Current Outpatient Prescriptions  Medication Sig Dispense Refill  . albuterol (PROVENTIL HFA;VENTOLIN HFA) 108 (90 BASE) MCG/ACT inhaler Inhale 2 puffs into the lungs every 4 (four) hours as needed for wheezing (cough, shortness of breath or wheezing.).  1 Inhaler  1  . atenolol-chlorthalidone (TENORETIC) 50-25 MG per tablet Take 1 tablet by mouth daily.  90 tablet  1  . cyclobenzaprine (FLEXERIL) 10 MG tablet Take 1 tablet (10 mg total) by mouth 3 (three) times daily as needed for muscle spasms.  30 tablet  1  . doxycycline (VIBRAMYCIN) 100 MG capsule Take 1 capsule (100 mg total) by mouth 2 (two) times daily.  20 capsule  0  . lansoprazole (PREVACID) 15 MG capsule Take 15 mg by mouth daily at 12 noon.      Marland Kitchen. lisinopril (PRINIVIL,ZESTRIL) 40 MG tablet Take 1 tablet (40 mg total) by mouth daily.  90 tablet  1  . loperamide (IMODIUM A-D) 2 MG tablet Take 1 tablet (2  mg total) by mouth 4 (four) times daily as needed for diarrhea or loose stools.  30 tablet  0  . meloxicam (MOBIC) 15 MG tablet Take 1 tablet (15 mg total) by mouth daily as needed for pain.  30 tablet  3  . omeprazole (PRILOSEC) 40 MG capsule Take 1 capsule (40 mg total) by mouth daily.  30 capsule  3   No current facility-administered medications for this visit.    Allergies as of 04/10/2013 - Review Complete 04/10/2013  Allergen Reaction Noted  . Latex  07/18/2011  . Penicillins  07/18/2011    Family History  Problem Relation Age of Onset  . Diabetes Father   . Hypertension Father   . Diabetes Brother   . Hypertension Brother   . Diabetes Brother   . Diabetes Brother     History   Social History  . Marital Status: Married    Spouse Name: N/A    Number of Children: N/A  . Years of Education: N/A   Occupational History  . Not on file.   Social History Main Topics  . Smoking status: Former Games developermoker  . Smokeless tobacco: Never Used  . Alcohol Use: No  . Drug Use: No  . Sexual Activity: Not on file   Other Topics Concern  .  Not on file   Social History Narrative   Marital status: married      Employment: [employer]; drives all over.  Owns company.      Tobacco:  Quit smoking in 2005; smoked 25 years      Alcohol:  Rare beer      Drugs:  None      Exercise:  None   Daily caffeine        Physical Exam: Ht 5' 8.5" (1.74 m)  Wt 232 lb (105.235 kg)  BMI 34.76 kg/m2 Constitutional: generally well-appearing Psychiatric: alert and oriented x3 Eyes: extraocular movements intact Mouth: oral pharynx moist, no lesions Neck: supple no lymphadenopathy Cardiovascular: heart regular rate and rhythm Lungs: clear to auscultation bilaterally Abdomen: soft, nontender, nondistended, no obvious ascites, no peritoneal signs, normal bowel sounds Extremities: no lower extremity edema bilaterally Skin: no lesions on visible extremities    Assessment and plan: 58  y.o. male with  self-limited diarrhea about a month ago routine risk for colon cancer,   His diarrhea resolved after several weeks. He has been taking Imodium once a day. I asked him to try to stop this to see how his bowels do without it. I suspect self-limited, infectious etiology. He has never had colon cancer screening I can tell of an hour like to proceed with colonoscopy for colon cancer screening at his soonest convenience. I see no reason for any further blood tests or imaging studies prior to then.

## 2013-04-13 ENCOUNTER — Telehealth: Payer: Self-pay

## 2013-04-13 NOTE — Telephone Encounter (Signed)
Pt appt needs to be rescheduled to a propofol day.  Left message on machine to call back

## 2013-04-14 NOTE — Telephone Encounter (Signed)
Pt was notified of the need to change appt date, he will check his schedule and call back to reschedule

## 2013-04-15 NOTE — Telephone Encounter (Signed)
Pt has been reschedule and re instructed and put on wait list for sooner appt

## 2013-04-28 ENCOUNTER — Encounter: Payer: Self-pay | Admitting: Gastroenterology

## 2013-05-21 ENCOUNTER — Telehealth: Payer: Self-pay | Admitting: Gastroenterology

## 2013-05-21 NOTE — Telephone Encounter (Signed)
Spoke to patient and he stated that he had baloney and a smoothie today.  I told him not to eat anything else.  I told him that he could have clear liquids and explained what they are.   He was encouraged to take ALL of his prep and to call us if he has any trouble.

## 2013-05-22 ENCOUNTER — Encounter: Payer: Self-pay | Admitting: Gastroenterology

## 2013-05-22 ENCOUNTER — Ambulatory Visit (AMBULATORY_SURGERY_CENTER): Payer: BC Managed Care – PPO | Admitting: Gastroenterology

## 2013-05-22 VITALS — BP 111/74 | HR 52 | Temp 97.8°F | Resp 26 | Ht 68.5 in | Wt 232.0 lb

## 2013-05-22 DIAGNOSIS — K5289 Other specified noninfective gastroenteritis and colitis: Secondary | ICD-10-CM

## 2013-05-22 DIAGNOSIS — K589 Irritable bowel syndrome without diarrhea: Secondary | ICD-10-CM

## 2013-05-22 DIAGNOSIS — R197 Diarrhea, unspecified: Secondary | ICD-10-CM

## 2013-05-22 HISTORY — DX: Irritable bowel syndrome, unspecified: K58.9

## 2013-05-22 MED ORDER — SODIUM CHLORIDE 0.9 % IV SOLN: 500.0000 mL | INTRAVENOUS | Status: DC

## 2013-05-22 NOTE — Op Note (Signed)
[facility] [address] [place], [id number]   COLONOSCOPY PROCEDURE REPORT  PATIENT: Larry Dawson, Larry H.  MR#: [id number] BIRTHDATE: [birth year] , 57  yrs. old GENDER: Male ENDOSCOPIST: Rachael Feeaniel P Jacobs, MD REFERRED DG:UYQIHBY:Chris Guest, M.D. PROCEDURE DATE:  05/22/2013 PROCEDURE:   Colonoscopy with biopsy First Screening Colonoscopy - Avg.  risk and is 50 yrs.  old or older - No.  Prior Negative Screening - Now for repeat screening. N/A  History of Adenoma - Now for follow-up colonoscopy & has been > or = to 3 yrs.  N/A  Polyps Removed Today? No.  Recommend repeat exam, <10 yrs? No. ASA CLASS:   Class II INDICATIONS:diarrhea. MEDICATIONS: propofol (Diprivan) 200mg  IV and MAC sedation, administered by CRNA  DESCRIPTION OF PROCEDURE:   After the risks benefits and alternatives of the procedure were thoroughly explained, informed consent was obtained.  A digital rectal exam revealed no abnormalities of the rectum.   The LB [id number]  endoscope was introduced through the anus and advanced to the terminal ileum which was intubated for a short distance. No adverse events experienced.   The quality of the prep was good.  The instrument was then slowly withdrawn as the colon was fully examined.   COLON FINDINGS: The terminal ileum was normal.  The colonic mucosa was normal and was biopsied randomly and sent to pathology.  The examination was otherwise normal.  Retroflexed views revealed no abnormalities. The time to cecum=5 minutes 42 seconds.  Withdrawal time=6 minutes 27 seconds.  The scope was withdrawn and the procedure completed. COMPLICATIONS: There were no complications.  ENDOSCOPIC IMPRESSION: The terminal ileum was normal. The colonic mucosa was normal and was biopsied randomly and sent to pathology. The examination was otherwise normal.  No polyps or cancers  RECOMMENDATIONS: You should continue to follow colorectal cancer screening guidelines for  "routine risk" patients with a repeat colonoscopy in 10 years. If biopsies show microscopic colitis, you will be started on appropriate medicine. Otherwise, please continue once daily imodium.   eSigned:  Rachael Feeaniel P Jacobs, MD 05/22/2013 9:26 AM

## 2013-05-22 NOTE — Progress Notes (Signed)
A/ox3 pleased with MAC, report to Karol RN 

## 2013-05-22 NOTE — Progress Notes (Signed)
Called to room to assist during endoscopic procedure.  Patient ID and intended procedure confirmed with present staff. Received instructions for my participation in the procedure from the performing physician.  

## 2013-05-22 NOTE — Patient Instructions (Signed)
Polyp instruction sheet given.  YOU HAD AN ENDOSCOPIC PROCEDURE TODAY AT THE [facility]: Refer to the procedure report that was given to you for any specific questions about what was found during the examination.  If the procedure report does not answer your questions, please call your gastroenterologist to clarify.  If you requested that your care partner not be given the details of your procedure findings, then the procedure report has been included in a sealed envelope for you to review at your convenience later.  YOU SHOULD EXPECT: Some feelings of bloating in the abdomen. Passage of more gas than usual.  Walking can help get rid of the air that was put into your GI tract during the procedure and reduce the bloating. If you had a lower endoscopy (such as a colonoscopy or flexible sigmoidoscopy) you may notice spotting of blood in your stool or on the toilet paper. If you underwent a bowel prep for your procedure, then you may not have a normal bowel movement for a few days.  DIET: Your first meal following the procedure should be a light meal and then it is ok to progress to your normal diet.  A half-sandwich or bowl of soup is an example of a good first meal.  Heavy or fried foods are harder to digest and may make you feel nauseous or bloated.  Likewise meals heavy in dairy and vegetables can cause extra gas to form and this can also increase the bloating.  Drink plenty of fluids but you should avoid alcoholic beverages for 24 hours.  ACTIVITY: Your care partner should take you home directly after the procedure.  You should plan to take it easy, moving slowly for the rest of the day.  You can resume normal activity the day after the procedure however you should NOT DRIVE or use heavy machinery for 24 hours (because of the sedation medicines used during the test).    SYMPTOMS TO REPORT IMMEDIATELY: A gastroenterologist can be reached at any hour.  During normal business hours, 8:30 AM to  5:00 PM Monday through Friday, call [phone].  After hours and on weekends, please call the GI answering service at [phone] who will take a message and have the physician on call contact you.   Following lower endoscopy (colonoscopy or flexible sigmoidoscopy):  Excessive amounts of blood in the stool  Significant tenderness or worsening of abdominal pains  Swelling of the abdomen that is new, acute  FOLLOW UP: If any biopsies were taken you will be contacted by phone or by letter within the next 1-3 weeks.  Call your gastroenterologist if you have not heard about the biopsies in 3 weeks.  Our staff will call the home number listed on your records the next business day following your procedure to check on you and address any questions or concerns that you may have at that time regarding the information given to you following your procedure. This is a courtesy call and so if there is no answer at the home number and we have not heard from you through the emergency physician on call, we will assume that you have returned to your regular daily activities without incident.  SIGNATURES/CONFIDENTIALITY: You and/or your care partner have signed paperwork which will be entered into your electronic medical record.  These signatures attest to the fact that that the information above on your After Visit Summary has been reviewed and is understood.  Full responsibility of the confidentiality of this discharge  information lies with you and/or your care-partner. 

## 2013-05-25 ENCOUNTER — Telehealth: Payer: Self-pay | Admitting: *Deleted

## 2013-05-25 NOTE — Telephone Encounter (Signed)
  Follow up Call-  Call back number 05/22/2013  Post procedure Call Back phone  # [phone]  Permission to leave phone message Yes     Patient questions:  Do you have a fever, pain , or abdominal swelling? no Pain Score  0 *  Have you tolerated food without any problems? yes  Have you been able to return to your normal activities? yes  Do you have any questions about your discharge instructions: Diet   no Medications  no Follow up visit  no  Do you have questions or concerns about your Care? no  Actions: * If pain score is 4 or above: No action needed, pain <4.

## 2013-05-31 ENCOUNTER — Other Ambulatory Visit: Payer: Self-pay | Admitting: Family Medicine

## 2013-06-08 ENCOUNTER — Telehealth: Payer: Self-pay | Admitting: *Deleted

## 2013-06-08 ENCOUNTER — Telehealth: Payer: Self-pay | Admitting: Gastroenterology

## 2013-06-08 MED ORDER — PREDNISONE 5 MG PO TABS: 5.0000 mg | ORAL_TABLET | Freq: Every day | ORAL | Status: DC

## 2013-06-08 MED ORDER — BUDESONIDE 3 MG PO CP24: 9.0000 mg | ORAL_CAPSULE | Freq: Every day | ORAL | Status: DC

## 2013-06-08 NOTE — Telephone Encounter (Signed)
rx sent for prednisone, pt will call back to schedule follow up

## 2013-06-08 NOTE — Telephone Encounter (Signed)
Patient called back and I advised him of Dr. Christella HartiganJacobs recommendation below.  I [facility] [address] I advised office visit needs to be scheduled for 4-5 weeks, patient said he has to look at his schedule and call us back.  patty Please call the patient. 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 No tapering, He needs rov with me in 4-5 weeks to discuss his response, start taper.

## 2013-06-08 NOTE — Telephone Encounter (Signed)
Please call in prednisone 5mg  pills, disp 60, 2 refills.  He should take one pill once daily.  No tapering for now.  Needs rov with me in 5-6 weeks.  HE should call back in 2 weeks if he had NOT noticed improvement after starting the prednisone.

## 2013-06-08 NOTE — Telephone Encounter (Signed)
Anything else cheaper he can try?

## 2013-07-02 ENCOUNTER — Ambulatory Visit (INDEPENDENT_AMBULATORY_CARE_PROVIDER_SITE_OTHER): Payer: BC Managed Care – PPO | Admitting: Emergency Medicine

## 2013-07-02 VITALS — BP 130/80 | HR 73 | Temp 98.1°F | Resp 16 | Ht 68.5 in | Wt 236.2 lb

## 2013-07-02 DIAGNOSIS — R7309 Other abnormal glucose: Secondary | ICD-10-CM

## 2013-07-02 DIAGNOSIS — R739 Hyperglycemia, unspecified: Secondary | ICD-10-CM

## 2013-07-02 DIAGNOSIS — I10 Essential (primary) hypertension: Secondary | ICD-10-CM

## 2013-07-02 LAB — POCT CBC
GRANULOCYTE PERCENT: 61.9 % (ref 37–80)
HEMATOCRIT: 44.5 % (ref 43.5–53.7)
Hemoglobin: 13.9 g/dL — AB (ref 14.1–18.1)
Lymph, poc: 2.5 (ref 0.6–3.4)
MCH, POC: 27.5 pg (ref 27–31.2)
MCHC: 31.2 g/dL — AB (ref 31.8–35.4)
MCV: 87.9 fL (ref 80–97)
MID (cbc): 0.4 (ref 0–0.9)
MPV: 8.4 fL (ref 0–99.8)
PLATELET COUNT, POC: 314 10*3/uL (ref 142–424)
POC Granulocyte: 4.8 (ref 2–6.9)
POC LYMPH %: 32.5 % (ref 10–50)
POC MID %: 5.6 % (ref 0–12)
RBC: 5.06 M/uL (ref 4.69–6.13)
RDW, POC: 13.5 %
WBC: 7.7 10*3/uL (ref 4.6–10.2)

## 2013-07-02 LAB — POCT GLYCOSYLATED HEMOGLOBIN (HGB A1C): HEMOGLOBIN A1C: 6.2

## 2013-07-02 MED ORDER — ATENOLOL-CHLORTHALIDONE 50-25 MG PO TABS: ORAL_TABLET | ORAL | Status: DC

## 2013-07-02 MED ORDER — LISINOPRIL 20 MG PO TABS
ORAL_TABLET | ORAL | Status: DC
Start: 2013-07-02 — End: 2014-07-13

## 2013-07-02 NOTE — Patient Instructions (Signed)

## 2013-07-02 NOTE — Progress Notes (Signed)
Subjective:    Patient ID: Larry Dawson, male    DOB: [birth year], 58 y.o.   MRN: [id number] This chart was scribed for Collene GobbleSteven A Daub, MD by Valera CastleSteven Perry, ED Scribe. This patient was seen in room 11 and the patient's care was started at 1:37 PM.  Chief Complaint  Patient presents with  . Medication Refill    HTN check up   HPI Larry Dawson is a 58 y.o. male Pt with h/o HTN presents for BP medication refill. He reports being otherwise healthy. He states he just had a colonoscopy, has had prostate exam recently. 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 He denies any other requests.  PCP - Tally DueGUEST, CHRIS WARREN, MD   Patient Active Problem List   Diagnosis Date Noted  . Sebaceous cyst 11/30/2012  . COPD (chronic obstructive pulmonary disease) 11/30/2012  . Balanitis 12/31/2011  . HTN (hypertension) 07/18/2011   Prior to Admission medications   Medication Sig Start Date End Date Taking? Authorizing Provider  atenolol-chlorthalidone (TENORETIC) 50-25 MG per tablet Take 1 tablet by mouth daily. PATIENT NEEDS OFFICE VISIT FOR ADDITIONAL REFILLS   Yes Ethelda ChickKristi M Smith, MD  budesonide (ENTOCORT EC) 3 MG 24 hr capsule Take 3 capsules (9 mg total) by mouth daily. 06/08/13  Yes Rachael Feeaniel P Jacobs, MD  lansoprazole (PREVACID) 15 MG capsule Take 15 mg by mouth daily at 12 noon.   Yes Historical Provider, MD  lisinopril (PRINIVIL,ZESTRIL) 20 MG tablet Take 2 tablets (40 mg total) by mouth daily. PATIENT NEEDS OFFICE VISIT FOR ADDITIONAL REFILLS   Yes Ethelda ChickKristi M Smith, MD  albuterol (PROVENTIL HFA;VENTOLIN HFA) 108 (90 BASE) MCG/ACT inhaler Inhale 2 puffs into the lungs every 4 (four) hours as needed for wheezing (cough, shortness of breath or wheezing.). 11/30/12   Sherren MochaEva N Shaw, MD  cyclobenzaprine (FLEXERIL) 10 MG tablet Take 1 tablet (10 mg total) by mouth 3 (three) times daily as needed for muscle spasms. 11/30/12   Sherren MochaEva N Shaw, MD  doxycycline (VIBRAMYCIN) 100 MG capsule Take 1 capsule (100 mg total) by mouth 2 (two)  times daily. 11/30/12   Sherren MochaEva N Shaw, MD  loperamide (IMODIUM A-D) 2 MG tablet Take 1 tablet (2 mg total) by mouth 4 (four) times daily as needed for diarrhea or loose stools. 02/22/13   Phillips OdorJeffery Anderson, MD  meloxicam (MOBIC) 15 MG tablet Take 1 tablet (15 mg total) by mouth daily as needed for pain. 11/30/12   Sherren MochaEva N Shaw, MD  predniSONE (DELTASONE) 5 MG tablet Take 1 tablet (5 mg total) by mouth daily with breakfast. 06/08/13   Rachael Feeaniel P Jacobs, MD   Review of Systems  Constitutional: Negative for fever.  Cardiovascular: Negative.  Negative for chest pain and leg swelling.  Gastrointestinal: Negative for diarrhea.   BP 130/80  Pulse 73  Temp(Src) 98.1 F (36.7 C) (Oral)  Resp 16  Ht 5' 8.5" (1.74 m)  Wt 236 lb 3.2 oz (107.14 kg)  BMI 35.39 kg/m2  SpO2 95%     Objective:   Physical Exam  Nursing note and vitals reviewed. Constitutional: He is oriented to person, place, and time. He appears well-developed and well-nourished. No distress.  HENT:  Head: Normocephalic and atraumatic.  Eyes: EOM are normal.  Neck: Neck supple.  Cardiovascular: Normal rate, regular rhythm, normal heart sounds and intact distal pulses.   Pulmonary/Chest: Effort normal and breath sounds normal. No respiratory distress. He has no wheezes. He has no rales.  Musculoskeletal: Normal range of motion.  He exhibits no edema.  Neurological: He is alert and oriented to person, place, and time.  Skin: Skin is warm and dry.  Psychiatric: He has a normal mood and affect. His behavior is normal.   Results for orders placed in visit on 07/02/13  POCT CBC      Result Value Ref Range   WBC 7.7  4.6 - 10.2 K/uL   Lymph, poc 2.5  0.6 - 3.4   POC LYMPH PERCENT 32.5  10 - 50 %L   MID (cbc) 0.4  0 - 0.9   POC MID % 5.6  0 - 12 %M   POC Granulocyte 4.8  2 - 6.9   Granulocyte percent 61.9  37 - 80 %G   RBC 5.06  4.69 - 6.13 M/uL   Hemoglobin 13.9 (*) 14.1 - 18.1 g/dL   HCT, POC 09.8  11.9 - 53.7 %   MCV 87.9  80 - 97 fL     MCH, POC 27.5  27 - 31.2 pg   MCHC 31.2 (*) 31.8 - 35.4 g/dL   RDW, POC 14.7     Platelet Count, POC 314  142 - 424 K/uL   MPV 8.4  0 - 99.8 fL  POCT GLYCOSYLATED HEMOGLOBIN (HGB A1C)      Result Value Ref Range   Hemoglobin A1C 6.2     BP in exam room: 120/80    Assessment & Plan:   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 I instructed him regarding that. His meds were refilled he should have a repeat hemoglobin A1c in 3 months .  I personally performed the services described in this documentation, which was scribed in my presence. The recorded information has been reviewed and is accurate.  *

## 2013-07-03 LAB — BASIC METABOLIC PANEL
BUN: 17 mg/dL (ref 6–23)
CO2: 29 meq/L (ref 19–32)
CREATININE: 1.11 mg/dL (ref 0.50–1.35)
Calcium: 9.3 mg/dL (ref 8.4–10.5)
Chloride: 100 mEq/L (ref 96–112)
Glucose, Bld: 96 mg/dL (ref 70–99)
Potassium: 4.1 mEq/L (ref 3.5–5.3)
Sodium: 136 mEq/L (ref 135–145)

## 2013-10-22 ENCOUNTER — Other Ambulatory Visit: Payer: Self-pay | Admitting: Urology

## 2013-11-17 ENCOUNTER — Encounter (HOSPITAL_BASED_OUTPATIENT_CLINIC_OR_DEPARTMENT_OTHER): Payer: Self-pay | Admitting: *Deleted

## 2013-11-17 NOTE — Progress Notes (Signed)
[facility]Istat ,Ekg on arrival-Instructed Npo after Mn-may take prevacid with small amt water in am.

## 2013-11-19 NOTE — H&P (Signed)
History of Present Illness    Larry Dawson originally presented to [facility] in May 2013 from Urgent medical care for further assessment of scrotal swelling/hydroceles. Patient had noticed a long-standing scrotal swelling that he felt had worsened. At that time he did not have much in the way of discomfort. The patient has had hernia surgery in the past as well as vasectomy.     Clinical exam as well as scrotal ultrasound confirmed the presence of what appeared to be relatively small bilateral unilocular hydroceles with normal symmetric testes.  We recommended ongoing observation rather than surgical correction at that time.     He now wants surgical correction. He has a new concern about pre-mature ejaculation   Past Medical History Problems  1. History of esophageal reflux (V12.79) 2. History of hypertension (V12.59)  Surgical History Problems  1. History of Hernia Repair 2. History of Surgery Of Male Genitalia Vasectomy  Current Meds 1. Aspirin TABS;  Therapy: (Recorded:09May2013) to Recorded 2. HTN Complex CAPS;  Therapy: (Recorded:12Jun2015) to Recorded 3. Lisinopril 20 MG Oral Tablet;  Therapy: (Recorded:09May2013) to Recorded 4. Vitamins/Minerals TABS;  Therapy: (Recorded:12Jun2015) to Recorded  Allergies Medication  1. Penicillins Non-Medication  2. Latex  Family History Problems  1. Family history of Diabetes Mellitus (V18.0) : Father 2. Family history of Diabetes Mellitus (V18.0) : Brother 3. Family history of Family Health Status - Father's Age 46. Family history of Family Health Status - Mother's Age 50. Family history of Family Health Status Number Of Children   3 daughters  Social History Problems  1. Denied: History of Alcohol Use 2. Caffeine Use   3 per day 3. Former smoker (V15.82)   smoked x 69yrs 4. Marital History - Currently Married 5. Occupation:   Psychologist, sport and exercise  Review of Systems Genitourinary, constitutional, skin, eye, otolaryngeal,  hematologic/lymphatic, cardiovascular, pulmonary, endocrine, musculoskeletal, gastrointestinal, neurological and psychiatric system(s) were reviewed and pertinent findings if present are noted.  Genitourinary: scrotal swelling.    Vitals  Height: 5 ft 8 in Weight: 235 lb  BMI Calculated: 35.73 BSA Calculated: 2.19 Blood Pressure: 118 / 71 Temperature: 97.7 F Heart Rate: 57  Physical Exam  Well-developed well-nourished male in no acute distress Respiratory: Normal effort Cardiac: Regular rate and rhythm Abdomen: Soft nontender Genitourinary: Examination of the penis demonstrates no discharge, no masses, no lesions and a normal meatus. The scrotum is without lesions. Examination of the right scrotum demonstrates a hydrocele. Examination of the left scrotum demostrates a hydrocele. The right epididymis is palpably normal and non-tender. The left epididymis is palpably normal and non-tender. The right testis is palpably normal, non-tender and without masses. The left testis is normal, non-tender and without masses.   Extremities: No tenderness edema Results/Data Urine [Data Includes: Last 1 Day]   12Jun2015 COLOR YELLOW  APPEARANCE CLEAR  SPECIFIC GRAVITY 1.010  pH 6.0  GLUCOSE NEG mg/dL BILIRUBIN NEG  KETONE NEG mg/dL BLOOD TRACE  PROTEIN NEG mg/dL UROBILINOGEN 0.2 mg/dL NITRITE NEG  LEUKOCYTE ESTERASE NEG  SQUAMOUS EPITHELIAL/HPF NONE SEEN  WBC 0-2 WBC/hpf RBC 0-2 RBC/hpf BACTERIA NONE SEEN  CRYSTALS NONE SEEN  CASTS NONE SEEN   Assessment Assessed  1. 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 UA With REFLEX; [Do Not Release]; Status:Resulted - Requires Verification;   Done:  12Jun2015 01:44PM Premature ejaculation  2. Start: PARoxetine HCl - 20 MG Oral Tablet; TAKE 1 TABLET DAILY 3. Follow-up Schedule Surgery Office  Follow-up  Status: Hold For -  Appointment   Requested for: 12Jun2015  Discussion/Summary   Mr  Larry Dawson returns today really with the desire to go ahead with a hydrocele correction. His scrotum has progressively enlarged. These are still on the small to medium size, but bilaterally they both him and he is interested in going ahead with definitive surgery. We did discuss that surgery with him, risks and benefits. He also is complaining of some increased issues with premature ejaculation. For that reason, I have provided him samples of a numbing spray and will also prescribe low-dose Paxil for him to use on a daily basis. If it is effective, he can go to p.r.n. He understands that this is typically used as an antidepressant and this is off-label use, but one that has been used successfully for a number of years for premature ejaculation. I quoted him about an 80% success rate.

## 2013-11-23 ENCOUNTER — Ambulatory Visit (HOSPITAL_BASED_OUTPATIENT_CLINIC_OR_DEPARTMENT_OTHER)
Admission: RE | Admit: 2013-11-23 | Discharge: 2013-11-23 | Disposition: A | Discharge status: Home / Self Care | Payer: BC Managed Care – PPO | Source: Ambulatory Visit | Attending: Urology | Admitting: Urology

## 2013-11-23 ENCOUNTER — Ambulatory Visit (HOSPITAL_BASED_OUTPATIENT_CLINIC_OR_DEPARTMENT_OTHER): Payer: BC Managed Care – PPO | Admitting: Anesthesiology

## 2013-11-23 ENCOUNTER — Encounter (HOSPITAL_BASED_OUTPATIENT_CLINIC_OR_DEPARTMENT_OTHER)
Admission: RE | Disposition: A | Discharge status: Home / Self Care | Payer: Self-pay | Source: Ambulatory Visit | Attending: Urology

## 2013-11-23 ENCOUNTER — Encounter (HOSPITAL_BASED_OUTPATIENT_CLINIC_OR_DEPARTMENT_OTHER): Payer: BC Managed Care – PPO | Admitting: Anesthesiology

## 2013-11-23 ENCOUNTER — Encounter (HOSPITAL_BASED_OUTPATIENT_CLINIC_OR_DEPARTMENT_OTHER): Payer: Self-pay | Admitting: *Deleted

## 2013-11-23 DIAGNOSIS — F524 Premature ejaculation: Secondary | ICD-10-CM | POA: Insufficient documentation

## 2013-11-23 DIAGNOSIS — N432 Other hydrocele: Secondary | ICD-10-CM | POA: Diagnosis present

## 2013-11-23 DIAGNOSIS — Z91040 Latex allergy status: Secondary | ICD-10-CM | POA: Diagnosis not present

## 2013-11-23 DIAGNOSIS — Z880 Allergy status to penicillin: Secondary | ICD-10-CM | POA: Diagnosis not present

## 2013-11-23 DIAGNOSIS — Z79899 Other long term (current) drug therapy: Secondary | ICD-10-CM | POA: Diagnosis not present

## 2013-11-23 DIAGNOSIS — Z7982 Long term (current) use of aspirin: Secondary | ICD-10-CM | POA: Insufficient documentation

## 2013-11-23 DIAGNOSIS — K219 Gastro-esophageal reflux disease without esophagitis: Secondary | ICD-10-CM | POA: Diagnosis not present

## 2013-11-23 DIAGNOSIS — I10 Essential (primary) hypertension: Secondary | ICD-10-CM | POA: Diagnosis not present

## 2013-11-23 HISTORY — PX: HYDROCELE EXCISION: SHX482

## 2013-11-23 HISTORY — DX: Irritable bowel syndrome without diarrhea: K58.9

## 2013-11-23 LAB — POCT I-STAT 4, (NA,K, GLUC, HGB,HCT)
GLUCOSE: 123 mg/dL — AB (ref 70–99)
HCT: 60 % — ABNORMAL HIGH (ref 39.0–52.0)
Hemoglobin: 20.4 g/dL — ABNORMAL HIGH (ref 13.0–17.0)
POTASSIUM: 3.4 meq/L — AB (ref 3.7–5.3)
Sodium: 140 mEq/L (ref 137–147)

## 2013-11-23 SURGERY — HYDROCELECTOMY
Anesthesia: General | Site: Scrotum | Laterality: Bilateral

## 2013-11-23 MED ORDER — HYDROCODONE-ACETAMINOPHEN 5-325 MG PO TABS
1.0000 | ORAL_TABLET | Freq: Once | ORAL | Status: AC
  Administered 2013-11-23: 1 via ORAL
  Filled 2013-11-23: qty 1

## 2013-11-23 MED ORDER — KETOROLAC TROMETHAMINE 30 MG/ML IJ SOLN
INTRAMUSCULAR | Status: DC | PRN
  Administered 2013-11-23: 30 mg via INTRAVENOUS

## 2013-11-23 MED ORDER — HYDROMORPHONE HCL PF 1 MG/ML IJ SOLN
0.2500 mg | INTRAMUSCULAR | Status: DC | PRN
  Filled 2013-11-23: qty 1

## 2013-11-23 MED ORDER — CIPROFLOXACIN IN D5W 400 MG/200ML IV SOLN
400.0000 mg | Freq: Two times a day (BID) | INTRAVENOUS | Status: DC
  Administered 2013-11-23: 400 mg via INTRAVENOUS
  Filled 2013-11-23: qty 200

## 2013-11-23 MED ORDER — METOCLOPRAMIDE HCL 5 MG/ML IJ SOLN
INTRAMUSCULAR | Status: DC | PRN
  Administered 2013-11-23: 10 mg via INTRAVENOUS

## 2013-11-23 MED ORDER — ONDANSETRON HCL 4 MG/2ML IJ SOLN
INTRAMUSCULAR | Status: DC | PRN
  Administered 2013-11-23: 4 mg via INTRAVENOUS

## 2013-11-23 MED ORDER — CEFAZOLIN SODIUM 1-5 GM-% IV SOLN
1.0000 g | INTRAVENOUS | Status: DC
  Filled 2013-11-23: qty 50

## 2013-11-23 MED ORDER — CIPROFLOXACIN IN D5W 400 MG/200ML IV SOLN
INTRAVENOUS | Status: AC
  Filled 2013-11-23: qty 200

## 2013-11-23 MED ORDER — DEXAMETHASONE SODIUM PHOSPHATE 4 MG/ML IJ SOLN
INTRAMUSCULAR | Status: DC | PRN
  Administered 2013-11-23: 10 mg via INTRAVENOUS

## 2013-11-23 MED ORDER — LACTATED RINGERS IV SOLN
INTRAVENOUS | Status: DC
  Administered 2013-11-23: 09:00:00 via INTRAVENOUS
  Filled 2013-11-23: qty 1000

## 2013-11-23 MED ORDER — BUPIVACAINE HCL (PF) 0.25 % IJ SOLN
INTRAMUSCULAR | Status: DC | PRN
  Administered 2013-11-23: 10 mL

## 2013-11-23 MED ORDER — FENTANYL CITRATE 0.05 MG/ML IJ SOLN
INTRAMUSCULAR | Status: DC | PRN
  Administered 2013-11-23 (×3): 50 ug via INTRAVENOUS

## 2013-11-23 MED ORDER — OXYCODONE HCL 5 MG PO TABS
5.0000 mg | ORAL_TABLET | Freq: Once | ORAL | Status: DC | PRN
  Filled 2013-11-23: qty 1

## 2013-11-23 MED ORDER — FENTANYL CITRATE 0.05 MG/ML IJ SOLN
INTRAMUSCULAR | Status: AC
  Filled 2013-11-23: qty 4

## 2013-11-23 MED ORDER — MIDAZOLAM HCL 2 MG/2ML IJ SOLN
INTRAMUSCULAR | Status: AC
  Filled 2013-11-23: qty 2

## 2013-11-23 MED ORDER — HYDROCODONE-ACETAMINOPHEN 5-325 MG PO TABS: 1.0000 | ORAL_TABLET | Freq: Four times a day (QID) | ORAL | Status: DC | PRN

## 2013-11-23 MED ORDER — LIDOCAINE HCL (CARDIAC) 20 MG/ML IV SOLN
INTRAVENOUS | Status: DC | PRN
  Administered 2013-11-23: 100 mg via INTRAVENOUS

## 2013-11-23 MED ORDER — SODIUM CHLORIDE 0.9 % IR SOLN
Status: DC | PRN
  Administered 2013-11-23: 500 mL

## 2013-11-23 MED ORDER — SUCCINYLCHOLINE CHLORIDE 20 MG/ML IJ SOLN
INTRAMUSCULAR | Status: DC | PRN
  Administered 2013-11-23: 120 mg via INTRAVENOUS

## 2013-11-23 MED ORDER — PROMETHAZINE HCL 25 MG/ML IJ SOLN
6.2500 mg | INTRAMUSCULAR | Status: DC | PRN
  Filled 2013-11-23: qty 1

## 2013-11-23 MED ORDER — PROPOFOL 10 MG/ML IV BOLUS
INTRAVENOUS | Status: DC | PRN
  Administered 2013-11-23: 200 mg via INTRAVENOUS

## 2013-11-23 MED ORDER — OXYCODONE HCL 5 MG/5ML PO SOLN
5.0000 mg | Freq: Once | ORAL | Status: DC | PRN
  Filled 2013-11-23: qty 5

## 2013-11-23 MED ORDER — ACETAMINOPHEN 10 MG/ML IV SOLN
INTRAVENOUS | Status: DC | PRN
  Administered 2013-11-23: 1000 mg via INTRAVENOUS

## 2013-11-23 MED ORDER — HYDROCODONE-ACETAMINOPHEN 5-325 MG PO TABS
ORAL_TABLET | ORAL | Status: AC
  Filled 2013-11-23: qty 1

## 2013-11-23 MED ORDER — MEPERIDINE HCL 25 MG/ML IJ SOLN
6.2500 mg | INTRAMUSCULAR | Status: DC | PRN
  Filled 2013-11-23: qty 1

## 2013-11-23 MED ORDER — MIDAZOLAM HCL 5 MG/5ML IJ SOLN
INTRAMUSCULAR | Status: DC | PRN
  Administered 2013-11-23: 2 mg via INTRAVENOUS

## 2013-11-23 SURGICAL SUPPLY — 48 items
APL SKNCLS STERI-STRIP NONHPOA (GAUZE/BANDAGES/DRESSINGS) ×1
BENZOIN TINCTURE PRP APPL 2/3 (GAUZE/BANDAGES/DRESSINGS) ×3 IMPLANT
BLADE CLIPPER SURG (BLADE) ×2 IMPLANT
BLADE SURG 10 STRL SS (BLADE) IMPLANT
BLADE SURG 15 STRL LF DISP TIS (BLADE) ×1 IMPLANT
BLADE SURG 15 STRL SS (BLADE) ×3
BNDG GAUZE ELAST 4 BULKY (GAUZE/BANDAGES/DRESSINGS) ×3 IMPLANT
CANISTER SUCTION 1200CC (MISCELLANEOUS) IMPLANT
CANISTER SUCTION 2500CC (MISCELLANEOUS) ×2 IMPLANT
CLEANER CAUTERY TIP 5X5 PAD (MISCELLANEOUS) ×1 IMPLANT
CLOTH BEACON ORANGE TIMEOUT ST (SAFETY) ×3 IMPLANT
COVER MAYO STAND STRL (DRAPES) ×3 IMPLANT
COVER TABLE BACK 60X90 (DRAPES) ×3 IMPLANT
DRAIN PENROSE 18X1/4 LTX STRL (WOUND CARE) IMPLANT
DRAPE PED LAPAROTOMY (DRAPES) ×3 IMPLANT
DRSG TEGADERM 2-3/8X2-3/4 SM (GAUZE/BANDAGES/DRESSINGS) ×4 IMPLANT
ELECT NDL TIP 2.8 STRL (NEEDLE) IMPLANT
ELECT NEEDLE TIP 2.8 STRL (NEEDLE) IMPLANT
ELECT REM PT RETURN 9FT ADLT (ELECTROSURGICAL) ×3
ELECTRODE REM PT RTRN 9FT ADLT (ELECTROSURGICAL) ×1 IMPLANT
GLOVE BIO SURGEON STRL SZ7.5 (GLOVE) ×1 IMPLANT
GOWN STRL REUS W/ TWL LRG LVL3 (GOWN DISPOSABLE) IMPLANT
GOWN STRL REUS W/ TWL XL LVL3 (GOWN DISPOSABLE) ×1 IMPLANT
GOWN STRL REUS W/TWL LRG LVL3 (GOWN DISPOSABLE) ×3
GOWN STRL REUS W/TWL XL LVL3 (GOWN DISPOSABLE) ×6
NEEDLE HYPO 25X1 1.5 SAFETY (NEEDLE) ×3 IMPLANT
NS IRRIG 500ML POUR BTL (IV SOLUTION) ×3 IMPLANT
PACK BASIN DAY SURGERY FS (CUSTOM PROCEDURE TRAY) ×3 IMPLANT
PAD CLEANER CAUTERY TIP 5X5 (MISCELLANEOUS) ×2
PENCIL BUTTON HOLSTER BLD 10FT (ELECTRODE) ×3 IMPLANT
SUPPORT SCROTAL LG STRP (MISCELLANEOUS) IMPLANT
SUPPORTER ATHLETIC LG (MISCELLANEOUS)
SUT SILK 3 0 SH 30 (SUTURE) ×6 IMPLANT
SUT VIC AB 3-0 PS2 18 (SUTURE) ×3
SUT VIC AB 3-0 PS2 18XBRD (SUTURE) IMPLANT
SUT VIC AB 3-0 SH 27 (SUTURE) ×3
SUT VIC AB 3-0 SH 27X BRD (SUTURE) ×1 IMPLANT
SUT VIC AB 4-0 SH 27 (SUTURE)
SUT VIC AB 4-0 SH 27XANBCTRL (SUTURE) IMPLANT
SUT VIC AB 5-0 P-3 18X BRD (SUTURE) IMPLANT
SUT VIC AB 5-0 P3 18 (SUTURE)
SUT VICRYL 4-0 PS2 18IN ABS (SUTURE) ×3 IMPLANT
SYR CONTROL 10ML LL (SYRINGE) ×3 IMPLANT
TOWEL OR 17X24 6PK STRL BLUE (TOWEL DISPOSABLE) ×6 IMPLANT
TRAY DSU PREP LF (CUSTOM PROCEDURE TRAY) ×3 IMPLANT
TUBE CONNECTING 12'X1/4 (SUCTIONS) ×1
TUBE CONNECTING 12X1/4 (SUCTIONS) ×2 IMPLANT
YANKAUER SUCT BULB TIP NO VENT (SUCTIONS) ×3 IMPLANT

## 2013-11-23 NOTE — Transfer of Care (Signed)
Immediate Anesthesia Transfer of Care Note  Patient: Larry KubaKarl H Cooner  Procedure(s) Performed: Procedure(s) (LRB): HYDROCELECTOMY ADULT (Bilateral)  Patient Location: PACU  Anesthesia Type: General  Level of Consciousness: awake, alert  and oriented  Airway & Oxygen Therapy: Patient Spontanous Breathing and Patient connected to face mask oxygen  Post-op Assessment: Report given to PACU RN and Post -op Vital signs reviewed and stable  Post vital signs: Reviewed and stable  Complications: No apparent anesthesia complications

## 2013-11-23 NOTE — Interval H&P Note (Signed)
History and Physical Interval Note:  11/23/2013 9:56 AM  Larry Dawson  has presented today for surgery, with the diagnosis of Bilateral Hydroceles  The various methods of treatment have been discussed with the patient and family. After consideration of risks, benefits and other options for treatment, the patient has consented to  Procedure(s): HYDROCELECTOMY ADULT (Bilateral) as a surgical intervention .  The patient's history has been reviewed, patient examined, no change in status, stable for surgery.  I have reviewed the patient's chart and labs.  Questions were answered to the patient's satisfaction.     Cypress Fanfan S

## 2013-11-23 NOTE — Anesthesia Preprocedure Evaluation (Addendum)
Anesthesia Evaluation  Patient identified by MRN, date of birth, ID band Patient awake    Reviewed: Allergy & Precautions, H&P , NPO status , Patient's Chart, lab work & pertinent test results, reviewed documented beta blocker date and time   Airway Mallampati: II TM Distance: >3 FB Neck ROM: Full    Dental no notable dental hx.    Pulmonary COPD COPD inhaler, former smoker,  breath sounds clear to auscultation  Pulmonary exam normal       Cardiovascular hypertension, Pt. on home beta blockers and Pt. on medications negative cardio ROS  Rhythm:Regular Rate:Normal     Neuro/Psych negative neurological ROS  negative psych ROS   GI/Hepatic negative GI ROS, Neg liver ROS,   Endo/Other  negative endocrine ROS  Renal/GU negative Renal ROS     Musculoskeletal negative musculoskeletal ROS (+)   Abdominal (+) + obese,   Peds  Hematology negative hematology ROS (+)   Anesthesia Other Findings   Reproductive/Obstetrics                          Anesthesia Physical Anesthesia Plan  ASA: II  Anesthesia Plan: General   Post-op Pain Management:    Induction: Intravenous  Airway Management Planned: Oral ETT  Additional Equipment:   Intra-op Plan:   Post-operative Plan: Extubation in OR  Informed Consent: I have reviewed the patients History and Physical, chart, labs and discussed the procedure including the risks, benefits and alternatives for the proposed anesthesia with the patient or authorized representative who has indicated his/her understanding and acceptance.   Dental advisory given  Plan Discussed with: CRNA  Anesthesia Plan Comments: (Due to hx of severe GERD with frank reflux and prior ?aspiration? [facility], plan GET[facility])       Anesthesia Quick Evaluation

## 2013-11-23 NOTE — Op Note (Signed)
Preoperative diagnosis:  Bilateral testicular hydrocelews Postoperative diagnosis: Same  Procedure: Bilateral Hydrocele repair  Surgeon: Valetta Fulleravid S. Viraat Vanpatten M.D.  Anesthesia: Gen.  Indications: Patient presented with scrotal swelling. A hydrocele was confirmed with scrotal ultrasonography. The patient was symptomatic from his hydrocele and requested surgical intervention. He appeared to understand the risks benefits potential complications of this procedure. The patient has received perioperative antibiotics and placement of PAS compression boots.  Technique and findings: Patient was brought to the operating room where he had successful induction of general anesthesia. The scrotum was then prepped and draped in the usual manner. Appropriate surgical timeout was performed. An incision was made in the median raphae of the scrotum. A right hydrocele was encountered which was freed from the scrotal wall and then the testis and hydrocele sac were delivered from the incision. The hydrocele sac was then incised and a moderate amount of clear yellow fluid was obtained. The testis was carefully inspected and no other pathology was appreciated. The hydrocele sac was moderate in thickness. Some of the redundant sac was excised with electrocautery. The sac was then plicated with some 3-0 Vicryl suture. The spermatic cord block was then performed with quarter percent Marcaine. The testis was returned to the hemiscrotum taking great care to make sure that there was no twisting of the spermatic cord. The scrotal compartment was then copiously irrigated. The contralateral left side was done in analogous manner with similar findings of a medium hydrocele and no other significant pathology. The scrotal incision was then closed with multiple layers of Vicryl suture. A Tegaderm dressing was applied. Sponge and needle counts were correct. No obvious complications occurred and the patient was brought to recovery room in stable  condition.

## 2013-11-23 NOTE — Anesthesia Postprocedure Evaluation (Signed)
Anesthesia Post Note  Patient: Larry Dawson  Procedure(s) Performed: Procedure(s) (LRB): HYDROCELECTOMY ADULT (Bilateral)  Anesthesia type: General  Patient location: PACU  Post pain: Pain level controlled  Post assessment: Post-op Vital signs reviewed  Last Vitals: BP 139/76  Pulse 69  Temp(Src) 36.2 C (Oral)  Resp 16  Ht 5' 8.5" (1.74 m)  Wt 235 lb 8 oz (106.822 kg)  BMI 35.28 kg/m2  SpO2 98%  Post vital signs: Reviewed  Level of consciousness: sedated  Complications: No apparent anesthesia complications

## 2013-11-23 NOTE — Discharge Instructions (Signed)
HOME CARE INSTRUCTIONS FOR SCROTAL PROCEDURES ° °Wound Care & Hygiene: °You may apply an ice bag to the scrotum for the first 24 hours.  This may help decrease swelling and soreness.  You may have a dressing held in place by an athletic supporter.  You may remove the dressing in 24 hours and shower in 48 hours.  Continue to use the athletic supporter or tight briefs for at least a week. ° °Activity: °Rest today - not necessarily flat bed rest.  Just take it easy.  You should not do strenuous activities until your follow-up visit with your doctor.  You may resume light activity in 48 hours. ° °Return to Work: ° °Your doctor will advise you of this depending on the type of work you do ° °Diet: °Drink liquids or eat a light diet this evening.  You may resume a regular diet tomorrow. ° °General Expectations: °You may have a small amount of bleeding.  The scrotum may be swollen or bruised for about a week. ° °Call your Doctor if these occur: ° -persistent or heavy bleeding ° -temperature of 101 degrees or more ° -severe pain, not relieved by your pain medication ° °Return to Doctor's Office:  *** °Call to set up and appointment. ° °Patient Signature:  __________________________________________________ ° °Nurse's Signature:  __________________________________________________ ° °Post Anesthesia Home Care Instructions ° °Activity: °Get plenty of rest for the remainder of the day. A responsible adult should stay with you for 24 hours following the procedure.  °For the next 24 hours, DO NOT: °-Drive a car °-Operate machinery °-Drink alcoholic beverages °-Take any medication unless instructed by your physician °-Make any legal decisions or sign important papers. ° °Meals: °Start with liquid foods such as gelatin or soup. Progress to regular foods as tolerated. Avoid greasy, spicy, heavy foods. If nausea and/or vomiting occur, drink only clear liquids until the nausea and/or vomiting subsides. Call your physician if vomiting  continues. ° °Special Instructions/Symptoms: °Your throat may feel dry or sore from the anesthesia or the breathing tube placed in your throat during surgery. If this causes discomfort, gargle with warm salt water. The discomfort should disappear within 24 hours. ° °

## 2013-11-23 NOTE — Anesthesia Procedure Notes (Signed)
Procedure Name: Intubation Date/Time: 11/23/2013 10:22 AM Performed by: Norva PavlovALLAWAY, Jeriyah Granlund G Pre-anesthesia Checklist: Patient identified, Emergency Drugs available, Suction available and Patient being monitored Patient Re-evaluated:Patient Re-evaluated prior to inductionOxygen Delivery Method: Circle System Utilized Preoxygenation: Pre-oxygenation with 100% oxygen Intubation Type: IV induction, Cricoid Pressure applied and Rapid sequence Laryngoscope Size: Mac and 4 Grade View: Grade I Tube type: Oral Tube size: 8.0 mm Number of attempts: 1 Airway Equipment and Method: stylet Placement Confirmation: ETT inserted through vocal cords under direct vision,  positive ETCO2 and breath sounds checked- equal and bilateral Secured at: 24 cm Tube secured with: Tape Dental Injury: Teeth and Oropharynx as per pre-operative assessment

## 2013-11-24 ENCOUNTER — Encounter (HOSPITAL_BASED_OUTPATIENT_CLINIC_OR_DEPARTMENT_OTHER): Payer: Self-pay | Admitting: Urology

## 2014-01-09 ENCOUNTER — Ambulatory Visit (INDEPENDENT_AMBULATORY_CARE_PROVIDER_SITE_OTHER): Payer: Self-pay | Admitting: Family Medicine

## 2014-01-09 VITALS — BP 122/64 | HR 80 | Temp 98.0°F | Resp 16 | Ht 69.0 in | Wt 239.6 lb

## 2014-01-09 DIAGNOSIS — Z0000 Encounter for general adult medical examination without abnormal findings: Secondary | ICD-10-CM

## 2014-01-09 NOTE — Progress Notes (Signed)
Subjective:  This chart was scribed for Larry Sidle, MD by Conchita Paris, Medical Scribe. This patient was seen in Room 1 and the patient's care was started at 9:25 AM.    Patient ID: Larry Dawson, male    DOB: 01/25/1956, 58 y.o.   MRN: [id number]  HPI HPI Comments: Larry Dawson is a 58 y.o. male who presents to the [facility] for a DOT PE. 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 Had a hydrocele surgery one month ago by Dr. Isabel Caprice, which had complications (still swelling) told to go for another opinion. Pt has a PHx of HTN (Iansoprazole 2 pills) and COPD (Atenolol,1 pill). Pt notes he had a hernia repair "years ago". Pt mentions he quit smoking 15 years ago however he does have COPD. He notes he has gained 15 pounds due to being unable to exercise and ambulate.   Patient Active Problem List   Diagnosis Date Noted  . Hyperglycemia 07/02/2013  . Sebaceous cyst 11/30/2012  . COPD (chronic obstructive pulmonary disease) 11/30/2012  . Balanitis 12/31/2011  . HTN (hypertension) 07/18/2011   Past Medical History  Diagnosis Date  . Allergy   . Hypertension   . Hydrocele   . COPD (chronic obstructive pulmonary disease)     Albuterol PRN.  Marland Kitchen Anal fissure   . IBS (irritable bowel syndrome) 05/22/13    per chart note-pt denied   Past Surgical History  Procedure Laterality Date  . Vasectomy  prior to 2013  . Hernia repair  prior to 2013  . Hydrocele excision Bilateral 11/23/2013    Procedure: HYDROCELECTOMY ADULT;  Surgeon: Valetta Fuller, MD;  Location: [facility];  Service: Urology;  Laterality: Bilateral;   Allergies  Allergen Reactions  . Penicillins Hives, Rash and Other (See Comments)    Throat swells  . Latex Hives, Swelling and Rash   Prior to Admission medications   Medication Sig Start Date End Date Taking? Authorizing Provider  atenolol-chlorthalidone (TENORETIC) 50-25 MG per tablet Take one tablet daily 07/02/13  Yes Collene Gobble,  MD  calcium-vitamin D (OSCAL WITH D) 500-200 MG-UNIT per tablet Take 1 tablet by mouth.   Yes Historical Provider, MD  HYDROcodone-acetaminophen (NORCO/VICODIN) 5-325 MG per tablet Take 1-2 tablets by mouth every 6 (six) hours as needed. 11/23/13  Yes Valetta Fuller, MD  lansoprazole (PREVACID) 15 MG capsule Take 15 mg by mouth as needed.    Yes Historical Provider, MD  lisinopril (PRINIVIL,ZESTRIL) 20 MG tablet Take 2 tablets daily 07/02/13  Yes Collene Gobble, MD  predniSONE (DELTASONE) 5 MG tablet Take 1 tablet (5 mg total) by mouth daily with breakfast. 06/08/13  Yes Rachael Fee, MD  vitamin C (ASCORBIC ACID) 500 MG tablet Take 500 mg by mouth daily.   Yes Historical Provider, MD   History   Social History  . Marital Status: Married    Spouse Name: N/A    Number of Children: N/A  . Years of Education: N/A   Occupational History  . Not on file.   Social History Main Topics  . Smoking status: Former Games developer  . Smokeless tobacco: Never Used  . Alcohol Use: No  . Drug Use: No  . Sexual Activity: Not on file   Other Topics Concern  . Not on file   Social History Narrative   Marital status: married      Employment: [employer]; drives all over.  Owns company.  Tobacco:  Quit smoking in 2005; smoked 25 years      Alcohol:  Rare beer      Drugs:  None      Exercise:  None   Daily caffeine        Review of Systems     Objective:   Physical Exam  Nursing note and vitals reviewed. Constitutional: He is oriented to person, place, and time. He appears well-developed and well-nourished.  HENT:  Head: Normocephalic and atraumatic.  Right Ear: External ear normal.  Left Ear: External ear normal.  Mouth/Throat: Oropharynx is clear and moist. No oropharyngeal exudate.  Eyes: Conjunctivae and EOM are normal. Pupils are equal, round, and reactive to light.  Neck: Normal range of motion.  Cardiovascular: Normal rate, regular rhythm and normal heart sounds.   No murmur  heard. Pulmonary/Chest: Effort normal and breath sounds normal. No respiratory distress. He has no wheezes. He has no rales.  Musculoskeletal: Normal range of motion.  Neurological: He is alert and oriented to person, place, and time.  Skin: Skin is warm and dry.  Psychiatric: He has a normal mood and affect. His behavior is normal.    BP 122/64  Pulse 80  Temp(Src) 98 F (36.7 C) (Oral)  Resp 16  Ht 5\' 9"  (1.753 m)  Wt 239 lb 9.6 oz (108.682 kg)  BMI 35.37 kg/m2  SpO2 96%  See the physical exam sheet Large bilateral hydroceles, healed herniorrhaphy scars, no hernia     Assessment & Plan:  I personally performed the services described in this documentation, which was scribed in my presence. The recorded information has been reviewed and is accurate.  Annual physical exam  Signed, Larry SidleKurt Clarabell Matsuoka, MD

## 2014-03-05 ENCOUNTER — Ambulatory Visit (INDEPENDENT_AMBULATORY_CARE_PROVIDER_SITE_OTHER): Payer: BC Managed Care – PPO | Admitting: Emergency Medicine

## 2014-03-05 VITALS — BP 110/68 | HR 61 | Temp 98.0°F | Resp 18 | Ht 68.0 in | Wt 239.6 lb

## 2014-03-05 DIAGNOSIS — L409 Psoriasis, unspecified: Secondary | ICD-10-CM

## 2014-03-05 LAB — POCT SKIN KOH: SKIN KOH, POC: NEGATIVE

## 2014-03-05 MED ORDER — BETAMETHASONE DIPROPIONATE 0.05 % EX OINT: TOPICAL_OINTMENT | Freq: Two times a day (BID) | CUTANEOUS | Status: DC

## 2014-03-05 NOTE — Progress Notes (Signed)
[facility] [address], [place] [address] [phone]  Date:  03/05/2014   Name:  Azucena KubaKarl H Krul   DOB:  1955/06/13   MRN:  [id number]  PCP:  Tally DueGUEST, CHRIS WARREN, MD    Chief Complaint: Rash   History of Present Illness:  Azucena KubaKarl H Mcclard is a 58 y.o. very pleasant male patient who presents with the following:  Three month duration rash started in groin and then lower back. Now has rash on back and chest.  Less on extremities and neck Pruritic No improvement with over the counter medications or other home remedies. No improvement with over the counter medications or other home remedies.  Denies other complaint or health concern today.   Patient Active Problem List   Diagnosis Date Noted  . Hyperglycemia 07/02/2013  . Sebaceous cyst 11/30/2012  . COPD (chronic obstructive pulmonary disease) 11/30/2012  . Balanitis 12/31/2011  . HTN (hypertension) 07/18/2011    Past Medical History  Diagnosis Date  . Allergy   . Hypertension   . Hydrocele   . COPD (chronic obstructive pulmonary disease)     Albuterol PRN.  Marland Kitchen. Anal fissure   . IBS (irritable bowel syndrome) 05/22/13    per chart note-pt denied    Past Surgical History  Procedure Laterality Date  . Vasectomy  prior to 2013  . Hernia repair  prior to 2013  . Hydrocele excision Bilateral 11/23/2013    Procedure: HYDROCELECTOMY ADULT;  Surgeon: Valetta Fulleravid S Grapey, MD;  [facility];  Service: Urology;  Laterality: Bilateral;    History  Substance Use Topics  . Smoking status: Former Games developermoker  . Smokeless tobacco: Never Used  . Alcohol Use: No    Family History  Problem Relation Age of Onset  . Diabetes Father   . Hypertension Father   . Diabetes Brother   . Hypertension Brother   . Diabetes Brother   . Diabetes Brother     Allergies  Allergen Reactions  . Penicillins Hives, Rash and Other (See Comments)    Throat swells  . Latex Hives, Swelling and Rash     Medication list has been reviewed and updated.  Current Outpatient Prescriptions on File Prior to Visit  Medication Sig Dispense Refill  . atenolol-chlorthalidone (TENORETIC) 50-25 MG per tablet Take one tablet daily 30 tablet 11  . calcium-vitamin D (OSCAL WITH D) 500-200 MG-UNIT per tablet Take 1 tablet by mouth.    . lansoprazole (PREVACID) 15 MG capsule Take 15 mg by mouth as needed.     Marland Kitchen. lisinopril (PRINIVIL,ZESTRIL) 20 MG tablet Take 2 tablets daily 60 tablet 11  . predniSONE (DELTASONE) 5 MG tablet Take 1 tablet (5 mg total) by mouth daily with breakfast. 60 tablet 2  . vitamin C (ASCORBIC ACID) 500 MG tablet Take 500 mg by mouth daily.    Marland Kitchen. HYDROcodone-acetaminophen (NORCO/VICODIN) 5-325 MG per tablet Take 1-2 tablets by mouth every 6 (six) hours as needed. (Patient not taking: Reported on 03/05/2014) 30 tablet 0   No current facility-administered medications on file prior to visit.    Review of Systems:  As per HPI, otherwise negative.    Physical Examination: Filed Vitals:   03/05/14 1225  BP: 110/68  Pulse: 61  Temp: 98 F (36.7 C)  Resp: 18   Filed Vitals:   03/05/14 1225  Height: 5\' 8"  (1.727 m)  Weight: 239 lb 9.6 oz (108.682 kg)   Body mass index is 36.44 kg/(m^2).  Ideal Body Weight: Weight in (lb) to have BMI = 25: 164.1   GEN: WDWN, NAD, Non-toxic, Alert & Oriented x 3 HEENT: Atraumatic, Normocephalic.  Ears and Nose: No external deformity. EXTR: No clubbing/cyanosis/edema NEURO: Normal gait.  PSYCH: Normally interactive. Conversant. Not depressed or anxious appearing.  Calm demeanor.  SKIN:  Rash consistent with psoriasis  Assessment and Plan: Psoriasis Keep appt with dermatology Diprosone  Signed,  Phillips OdorJeffery Lenzie Montesano, MD

## 2014-03-05 NOTE — Patient Instructions (Signed)
Psoriasis Psoriasis is a common, long-lasting (chronic) inflammation of the skin. It affects both men and women equally, of all ages and all races. Psoriasis cannot be passed from person to person (not contagious). Psoriasis varies from mild to very severe. When severe, it can greatly affect your quality of life. Psoriasis is an inflammatory disorder affecting the skin as well as other organs including the joints (causing an arthritis). With psoriasis, the skin sheds its top layer of cells more rapidly than it does in someone without psoriasis. CAUSES  The cause of psoriasis is largely unknown. Genetics, your immune system, and the environment seem to play a role in causing psoriasis. Factors that can make psoriasis worse include:  Damage or trauma to the skin, such as cuts, scrapes, and sunburn. This damage often causes new areas of psoriasis (lesions).  Winter dryness and lack of sunlight.  Medicines such as lithium, beta-blockers, antimalarial drugs, ACE inhibitors, nonsteroidal anti-inflammatory drugs (ibuprofen, aspirin), and terbinafine. Let your caregiver know if you are taking any of these drugs.  Alcohol. Excessive alcohol use should be avoided if you have psoriasis. Drinking large amounts of alcohol can affect:  How well your psoriasis treatment works.  How safe your psoriasis treatment is.  Smoking. If you smoke, ask your caregiver for help to quit.  Stress.  Bacterial or viral infections.  Arthritis. Arthritis associated with psoriasis (psoriatic arthritis) affects less than 10% of patients with psoriasis. The arthritic intensity does not always match the skin psoriasis intensity. It is important to let your caregiver know if your joints hurt or if they are stiff. SYMPTOMS  The most common form of psoriasis begins with little red bumps that gradually become larger. The bumps begin to form scales that flake off easily. The lower layers of scales stick together. When these scales  are scratched or removed, the underlying skin is tender and bleeds easily. These areas then grow in size and may become large. Psoriasis often creates a rash that looks the same on both sides of the body (symmetrical). It often affects the elbows, knees, groin, genitals, arms, legs, scalp, and nails. Affected nails often have pitting, loosen, thicken, crumble, and are difficult to treat.  "Inverse psoriasis"occurs in the armpits, under breasts, in skin folds, and around the groin, buttocks, and genitals.  "Guttate psoriasis" generally occurs in children and young adults following a recent sore throat (strep throat). It begins with many small, red, scaly spots on the skin. It clears spontaneously in weeks or a few months without treatment. DIAGNOSIS  Psoriasis is diagnosed by physical exam. A tissue sample (biopsy) may also be taken. TREATMENT The treatment of psoriasis depends on your age, health, and living conditions.  Steroid (cortisone) creams, lotions, and ointments may be used. These treatments are associated with thinning of the skin, blood vessels that get larger (dilated), loss of skin pigmentation, and easy bruising. It is important to use these steroids as directed by your caregiver. Only treat the affected areas and not the normal, unaffected skin. People on long-term steroid treatment should wear a medical alert bracelet. Injections may be used in areas that are difficult to treat.  Scalp treatments are available as shampoos, solutions, sprays, foams, and oils. Avoid scratching the scalp and picking at the scales.  Anthralin medicine works well on areas that are difficult to treat. However, it stains clothes and skin and may cause temporary irritation.  Synthetic vitamin D (calcipotriene)can be used on small areas. It is available by prescription. The forms   of synthetic vitamin D available in health food stores do not help with psoriasis.  Coal tarsare available in various strengths  for psoriasis that is difficult to treat. They are one of the longest used treatments for difficult to treat psoriasis. However, they are messy to use.  Light therapy (UV therapy) can be carefully and professionally monitored in a dermatologist's office. Careful sunbathing is helpful for many people as directed by your caregiver. The exposure should be just long enough to cause a mild redness (erythema) of your skin. Avoid sunburn as this may make the condition worse. Sunscreen (SPF of 30 or higher) should be used to protect against sunburn. Cataracts, wrinkles, and skin aging are some of the harmful side effects of light therapy.  If creams (topical medicines) fail, there are several other options for systemic or oral medicines your caregiver can suggest. Psoriasis can sometimes be very difficult to treat. It can come and go. It is necessary to follow up with your caregiver regularly if your psoriasis is difficult to treat. Usually, with persistence you can get a good amount of relief. Maintaining consistent care is important. Do not change caregivers just because you do not see immediate results. It may take several trials to find the right combination of treatment for you. PREVENTING FLARE-UPS  Wear gloves while you wash dishes, while cleaning, and when you are outside in the cold.  If you have radiators, place a bowl of water or damp towel on the radiator. This will help put water back in the air. You can also use a humidifier to keep the air moist. Try to keep the humidity at about 60% in your home.  Apply moisturizer while your skin is still damp from bathing or showering. This traps water in the skin.  Avoid long, hot baths or showers. Keep soap use to a minimum. Soaps dry out the skin and wash away the protective oils. Use a fragrance free, dye free soap.  Drink enough water and fluids to keep your urine clear or pale yellow. Not drinking enough water depletes your skin's water  supply.  Turn off the heat at night and keep it low during the day. Cool air is less drying. SEEK MEDICAL CARE IF:  You have increasing pain in the affected areas.  You have uncontrolled bleeding in the affected areas.  You have increasing redness or warmth in the affected areas.  You start to have pain or stiffness in your joints.  You start feeling depressed about your condition.  You have a fever. Document Released: 03/23/2000 Document Revised: 06/18/2011 Document Reviewed: 09/18/2010 Exit[employer] Patient Information 2015 [employer]. This information is not intended to replace advice given to you by your health care provider. Make sure you discuss any questions you have with your health care provider.  

## 2014-03-12 ENCOUNTER — Telehealth: Payer: Self-pay

## 2014-03-12 NOTE — Telephone Encounter (Signed)
Pt states he was seen Saturday and was given a rx for some sort of cream in very small tubes. States when he took this to his pharmacy they only gave him what they had and he's now run out - they did not have all of the supply he needed. Please advise as the pharmacy tells him they cannot refill for him. CB# [phone]

## 2014-03-15 MED ORDER — BETAMETHASONE DIPROPIONATE 0.05 % EX OINT: TOPICAL_OINTMENT | Freq: Two times a day (BID) | CUTANEOUS | Status: DC

## 2014-03-15 NOTE — Telephone Encounter (Signed)
Called pharm who reported that they had given him #5, 15 g tubes and run it for 10 day supply. She is not sure why it will not cover more yet. She suggested that since pt's affected area is so large that we should send in Rx for at least 225 g and they can fill that with the biggest tubes of 45 g ea. and put in "therapy change" override. Sent in RF so pt has enough for tx. Notified pt on VM of status.

## 2014-03-18 ENCOUNTER — Telehealth: Payer: Self-pay | Admitting: Family Medicine

## 2014-03-18 NOTE — Telephone Encounter (Signed)
Left a message for patient to come in and receive the flu shot or call and update his information about the flu shot.

## 2014-07-13 ENCOUNTER — Other Ambulatory Visit: Payer: Self-pay | Admitting: Emergency Medicine

## 2014-08-13 ENCOUNTER — Other Ambulatory Visit: Payer: Self-pay | Admitting: Emergency Medicine

## 2014-08-31 ENCOUNTER — Other Ambulatory Visit: Payer: Self-pay | Admitting: Physician Assistant

## 2014-08-31 NOTE — Telephone Encounter (Signed)
Spoke to pt about need for f/up and he was unaware. He agreed to RTC this weekend, but has run out of medication. I advised I will send in another wk of both meds to cover him.

## 2014-09-10 ENCOUNTER — Ambulatory Visit (INDEPENDENT_AMBULATORY_CARE_PROVIDER_SITE_OTHER): Payer: BLUE CROSS/BLUE SHIELD | Admitting: Physician Assistant

## 2014-09-10 VITALS — BP 124/80 | HR 90 | Temp 98.2°F | Resp 18 | Ht 68.0 in | Wt 238.0 lb

## 2014-09-10 DIAGNOSIS — I10 Essential (primary) hypertension: Secondary | ICD-10-CM

## 2014-09-10 DIAGNOSIS — J449 Chronic obstructive pulmonary disease, unspecified: Secondary | ICD-10-CM

## 2014-09-10 LAB — COMPLETE METABOLIC PANEL WITH GFR
ALT: 54 U/L — AB (ref 0–53)
AST: 32 U/L (ref 0–37)
Albumin: 4.3 g/dL (ref 3.5–5.2)
Alkaline Phosphatase: 44 U/L (ref 39–117)
BUN: 16 mg/dL (ref 6–23)
CALCIUM: 9.4 mg/dL (ref 8.4–10.5)
CO2: 28 mEq/L (ref 19–32)
Chloride: 99 mEq/L (ref 96–112)
Creat: 1.01 mg/dL (ref 0.50–1.35)
GFR, Est Non African American: 82 mL/min
GLUCOSE: 68 mg/dL — AB (ref 70–99)
Potassium: 3.4 mEq/L — ABNORMAL LOW (ref 3.5–5.3)
Sodium: 139 mEq/L (ref 135–145)
Total Bilirubin: 0.8 mg/dL (ref 0.2–1.2)
Total Protein: 7.6 g/dL (ref 6.0–8.3)

## 2014-09-10 LAB — POCT CBC
Granulocyte percent: 41.4 %G (ref 37–80)
HCT, POC: 46.4 % (ref 43.5–53.7)
Hemoglobin: 15.1 g/dL (ref 14.1–18.1)
Lymph, poc: 4.6 — AB (ref 0.6–3.4)
MCH, POC: 26.9 pg — AB (ref 27–31.2)
MCHC: 32.6 g/dL (ref 31.8–35.4)
MCV: 82.5 fL (ref 80–97)
MID (CBC): 1 — AB (ref 0–0.9)
MPV: 7.4 fL (ref 0–99.8)
POC GRANULOCYTE: 4 (ref 2–6.9)
POC LYMPH %: 47.7 % (ref 10–50)
POC MID %: 10.9 %M (ref 0–12)
Platelet Count, POC: 333 10*3/uL (ref 142–424)
RBC: 5.62 M/uL (ref 4.69–6.13)
RDW, POC: 13.7 %
WBC: 9.6 10*3/uL (ref 4.6–10.2)

## 2014-09-10 LAB — LIPID PANEL
CHOL/HDL RATIO: 5.1 ratio
CHOLESTEROL: 233 mg/dL — AB (ref 0–200)
HDL: 46 mg/dL (ref >=40)
LDL CALC: 156 mg/dL — AB (ref 0–99)
TRIGLYCERIDES: 154 mg/dL — AB (ref <150)
VLDL: 31 mg/dL (ref 0–40)

## 2014-09-10 MED ORDER — ATENOLOL-CHLORTHALIDONE 50-25 MG PO TABS: 1.0000 | ORAL_TABLET | Freq: Every day | ORAL | Status: DC

## 2014-09-10 MED ORDER — TIOTROPIUM BROMIDE MONOHYDRATE 18 MCG IN CAPS
18.0000 ug | ORAL_CAPSULE | Freq: Every day | RESPIRATORY_TRACT | Status: DC
Start: 2014-09-10 — End: 2015-04-27

## 2014-09-10 MED ORDER — LISINOPRIL 20 MG PO TABS: 20.0000 mg | ORAL_TABLET | Freq: Every day | ORAL | Status: DC

## 2014-09-10 NOTE — Patient Instructions (Addendum)
DASH Eating Plan °DASH stands for "Dietary Approaches to Stop Hypertension." The DASH eating plan is a healthy eating plan that has been shown to reduce high blood pressure (hypertension). Additional health benefits may include reducing the risk of type 2 diabetes mellitus, heart disease, and stroke. The DASH eating plan may also help with weight loss. °WHAT DO I NEED TO KNOW ABOUT THE DASH EATING PLAN? °For the DASH eating plan, you will follow these general guidelines: °· Choose foods with a percent daily value for sodium of less than 5% (as listed on the food label). °· Use salt-free seasonings or herbs instead of table salt or sea salt. °· Check with your health care provider or pharmacist before using salt substitutes. °· Eat lower-sodium products, often labeled as "lower sodium" or "no salt added." °· Eat fresh foods. °· Eat more vegetables, fruits, and low-fat dairy products. °· Choose whole grains. Look for the word "whole" as the first word in the ingredient list. °· Choose fish and skinless chicken or turkey more often than red meat. Limit fish, poultry, and meat to 6 oz (170 g) each day. °· Limit sweets, desserts, sugars, and sugary drinks. °· Choose heart-healthy fats. °· Limit cheese to 1 oz (28 g) per day. °· Eat more home-cooked food and less restaurant, buffet, and fast food. °· Limit fried foods. °· Cook foods using methods other than frying. °· Limit canned vegetables. If you do use them, rinse them well to decrease the sodium. °· When eating at a restaurant, ask that your food be prepared with less salt, or no salt if possible. °WHAT FOODS CAN I EAT? °Seek help from a dietitian for individual calorie needs. °Grains °Whole grain or whole wheat bread. Brown rice. Whole grain or whole wheat pasta. Quinoa, bulgur, and whole grain cereals. Low-sodium cereals. Corn or whole wheat flour tortillas. Whole grain cornbread. Whole grain crackers. Low-sodium crackers. °Vegetables °Fresh or frozen vegetables  (raw, steamed, roasted, or grilled). Low-sodium or reduced-sodium tomato and vegetable juices. Low-sodium or reduced-sodium tomato sauce and paste. Low-sodium or reduced-sodium canned vegetables.  °Fruits °All fresh, canned (in natural juice), or frozen fruits. °Meat and Other Protein Products °Ground beef (85% or leaner), grass-fed beef, or beef trimmed of fat. Skinless chicken or turkey. Ground chicken or turkey. Pork trimmed of fat. All fish and seafood. Eggs. Dried beans, peas, or lentils. Unsalted nuts and seeds. Unsalted canned beans. °Dairy °Low-fat dairy products, such as skim or 1% milk, 2% or reduced-fat cheeses, low-fat ricotta or cottage cheese, or plain low-fat yogurt. Low-sodium or reduced-sodium cheeses. °Fats and Oils °Tub margarines without trans fats. Light or reduced-fat mayonnaise and salad dressings (reduced sodium). Avocado. Safflower, olive, or canola oils. Natural peanut or almond butter. °Other °Unsalted popcorn and pretzels. °The items listed above may not be a complete list of recommended foods or beverages. Contact your dietitian for more options. °WHAT FOODS ARE NOT RECOMMENDED? °Grains °White bread. White pasta. White rice. Refined cornbread. Bagels and croissants. Crackers that contain trans fat. °Vegetables °Creamed or fried vegetables. Vegetables in a cheese sauce. Regular canned vegetables. Regular canned tomato sauce and paste. Regular tomato and vegetable juices. °Fruits °Dried fruits. Canned fruit in light or heavy syrup. Fruit juice. °Meat and Other Protein Products °Fatty cuts of meat. Ribs, chicken wings, bacon, sausage, bologna, salami, chitterlings, fatback, hot dogs, bratwurst, and packaged luncheon meats. Salted nuts and seeds. Canned beans with salt. °Dairy °Whole or 2% milk, cream, half-and-half, and cream cheese. Whole-fat or sweetened yogurt. Full-fat   cheeses or blue cheese. Nondairy creamers and whipped toppings. Processed cheese, cheese spreads, or cheese  curds. °Condiments °Onion and garlic salt, seasoned salt, table salt, and sea salt. Canned and packaged gravies. Worcestershire sauce. Tartar sauce. Barbecue sauce. Teriyaki sauce. Soy sauce, including reduced sodium. Steak sauce. Fish sauce. Oyster sauce. Cocktail sauce. Horseradish. Ketchup and mustard. Meat flavorings and tenderizers. Bouillon cubes. Hot sauce. Tabasco sauce. Marinades. Taco seasonings. Relishes. °Fats and Oils °Butter, stick margarine, lard, shortening, ghee, and bacon fat. Coconut, palm kernel, or palm oils. Regular salad dressings. °Other °Pickles and olives. Salted popcorn and pretzels. °The items listed above may not be a complete list of foods and beverages to avoid. Contact your dietitian for more information. °WHERE CAN I FIND MORE INFORMATION? °National Heart, Lung, and [employer] Institute: www.nhlbi.nih.gov/health/health-topics/topics/dash/ °Document Released: 03/15/2011 Document Revised: 08/10/2013 Document Reviewed: 01/28/2013 °ExitCare® Patient Information ©2015 [employer]. This information is not intended to replace advice given to you by your health care provider. Make sure you discuss any questions you have with your health care provider. ° °

## 2014-09-10 NOTE — Progress Notes (Signed)
[facility] [address] [place] [address] [phone]  Date:  09/10/2014   Name:  Larry Dawson   DOB:  03-Mar-1956   MRN:  [id number]  PCP:  Tally Due, MD    History of Present Illness:  Larry Dawson is a 59 y.o. male patient with PMH that includes HTN and COPD who presents to [facility] with followup of hypertension and medication refill. Patient is tolerating the medication well. He reports no adverse side effects. He is currently has been without medication for 3 days.  He denies any shortness of breath, dyspnea, chest pain, palpitations, or leg swellings.    Patient is also requesting medication for his COPD.patient is out of the Diprolene, however, he states that this did not help with his COPD. It is well controlled with bowel inhalers, but he does note that he has increased dyspnea with exercise.     Patient Active Problem List   Diagnosis Date Noted  . Hyperglycemia 07/02/2013  . Sebaceous cyst 11/30/2012  . COPD (chronic obstructive pulmonary disease) 11/30/2012  . Balanitis 12/31/2011  . HTN (hypertension) 07/18/2011    Past Medical History  Diagnosis Date  . Allergy   . Hypertension   . Hydrocele   . COPD (chronic obstructive pulmonary disease)     Albuterol PRN.  Marland Kitchen Anal fissure   . IBS (irritable bowel syndrome) 05/22/13    per chart note-pt denied    Past Surgical History  Procedure Laterality Date  . Vasectomy  prior to 2013  . Hernia repair  prior to 2013  . Hydrocele excision Bilateral 11/23/2013    Procedure: HYDROCELECTOMY ADULT;  Surgeon: Valetta Fuller, MD;  Location: [facility];  Service: Urology;  Laterality: Bilateral;    History  Substance Use Topics  . Smoking status: Former Games developer  . Smokeless tobacco: Never Used  . Alcohol Use: No    Family History  Problem Relation Age of Onset  . Diabetes Father   . Hypertension Father   . Diabetes Brother   . Hypertension Brother   . Diabetes  Brother   . Diabetes Brother     Allergies  Allergen Reactions  . Penicillins Hives, Rash and Other (See Comments)    Throat swells  . Latex Hives, Swelling and Rash    Medication list has been reviewed and updated.  Current Outpatient Prescriptions on File Prior to Visit  Medication Sig Dispense Refill  . atenolol-chlorthalidone (TENORETIC) 50-25 MG per tablet TAKE ONE TABLET BY MOUTH ONCE DAILY. 7 tablet 0  . betamethasone dipropionate (DIPROLENE) 0.05 % ointment Apply topically 2 (two) times daily. 225 g 1  . calcium-vitamin D (OSCAL WITH D) 500-200 MG-UNIT per tablet Take 1 tablet by mouth.    . lansoprazole (PREVACID) 15 MG capsule Take 15 mg by mouth as needed.     Marland Kitchen lisinopril (PRINIVIL,ZESTRIL) 20 MG tablet TAKE TWO TABLETS BY MOUTH ONCE DAILY. 14 tablet 0  . vitamin C (ASCORBIC ACID) 500 MG tablet Take 500 mg by mouth daily.    Marland Kitchen HYDROcodone-acetaminophen (NORCO/VICODIN) 5-325 MG per tablet Take 1-2 tablets by mouth every 6 (six) hours as needed. (Patient not taking: Reported on 03/05/2014) 30 tablet 0  . predniSONE (DELTASONE) 5 MG tablet Take 1 tablet (5 mg total) by mouth daily with breakfast. (Patient not taking: Reported on 09/10/2014) 60 tablet 2   No current facility-administered medications on file prior to visit.    ROS ROS otherwise  unremarkable unless listed above.    Physical Examination: BP 124/80 mmHg  Pulse 90  Temp(Src) 98.2 F (36.8 C) (Oral)  Resp 18  Ht 5\' 8"  (1.727 m)  Wt 238 lb (107.956 kg)  BMI 36.20 kg/m2  SpO2 95% Ideal Body Weight: Weight in (lb) to have BMI = 25: 164.1  Wt Readings from Last 3 Encounters:  09/10/14 238 lb (107.956 kg)  03/05/14 239 lb 9.6 oz (108.682 kg)  01/09/14 239 lb 9.6 oz (108.682 kg)    Physical Exam  Constitutional: He is oriented to person, place, and time. He appears well-developed and well-nourished.  HENT:  Head: Normocephalic and atraumatic.  Eyes: Conjunctivae and EOM are normal. Pupils are equal,  round, and reactive to light. Right eye exhibits no discharge. Left eye exhibits no discharge.  Neck: Normal range of motion. Neck supple. No thyromegaly present.  Cardiovascular: Normal rate, regular rhythm and normal heart sounds.  Exam reveals no friction rub.   No murmur heard. Pulmonary/Chest: Effort normal and breath sounds normal. No respiratory distress. He has no decreased breath sounds. He has no wheezes (Mild expiratory wheezing.  ). He has no rhonchi.  Abdominal: Soft. Bowel sounds are normal. He exhibits no distension. There is no tenderness.  Musculoskeletal: Normal range of motion. He exhibits no edema or tenderness.  Neurological: He is alert and oriented to person, place, and time.  Skin: Skin is warm and dry. No erythema.  Psychiatric: He has a normal mood and affect. His behavior is normal.     Assessment and Plan: 59 year old male is here today for follow up of HTN and medication refill.   -HTN stable.  Fill for 6 months.  He will return in 4 months for annual physical exam.   -following labs were placed -I am starting him on Spiriva to take daily in hopes of increasing his lung capacity and exercise duration  1. Essential hypertension - POCT CBC - COMPLETE METABOLIC PANEL WITH GFR - Lipid panel - lisinopril (PRINIVIL,ZESTRIL) 20 MG tablet; Take 1 tablet (20 mg total) by mouth daily.  Dispense: 30 tablet; Refill: 5 - atenolol-chlorthalidone (TENORETIC) 50-25 MG per tablet; Take 1 tablet by mouth daily.  Dispense: 30 tablet; Refill: 5  2. Chronic obstructive pulmonary disease, unspecified COPD, unspecified chronic bronchitis type - tiotropium (SPIRIVA) 18 MCG inhalation capsule; Place 1 capsule (18 mcg total) into inhaler and inhale daily.  Dispense: 30 capsule; Refill: 12  Trena PlattStephanie Marialice Newkirk, PA-C [facility] 09/10/2014 11:25 AM

## 2014-09-11 ENCOUNTER — Encounter: Payer: Self-pay | Admitting: Physician Assistant

## 2014-09-30 ENCOUNTER — Encounter: Payer: Self-pay | Admitting: Physician Assistant

## 2015-01-06 ENCOUNTER — Ambulatory Visit (INDEPENDENT_AMBULATORY_CARE_PROVIDER_SITE_OTHER): Payer: Self-pay | Admitting: Physician Assistant

## 2015-01-06 VITALS — BP 112/76 | HR 75 | Temp 97.8°F | Resp 16 | Ht 68.0 in | Wt 237.0 lb

## 2015-01-06 DIAGNOSIS — Z021 Encounter for pre-employment examination: Secondary | ICD-10-CM

## 2015-01-06 DIAGNOSIS — Z0289 Encounter for other administrative examinations: Secondary | ICD-10-CM

## 2015-01-06 NOTE — Progress Notes (Signed)
Subjective:    Patient ID: Larry Dawson, male    DOB: 01/02/1956, 59 y.o.   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 PMH for HTN and stable on lisinopril with follow up primary care provider. Also COPD currently controlled without medication. Adds that he gets eye infections easily, however, vision has not been affected. Has lower back pain on days that he plays golf, but otherwise unaffected. Has broken both ankles in the past due to a fall, but did not have surgery and denies mobility issues.   Allergies  Allergen Reactions  . Penicillins Hives, Rash and Other (See Comments)    Throat swells  . Latex Hives, Swelling and Rash    Review of Systems  Constitutional: Negative.  Negative for fever, chills, activity change, appetite change, fatigue and unexpected weight change.  HENT: Negative.  Negative for tinnitus.   Eyes: Negative.  Negative for photophobia, pain, discharge and visual disturbance.  Respiratory: Negative.  Negative for apnea, cough, chest tightness, shortness of breath and wheezing.   Cardiovascular: Negative.  Negative for chest pain, palpitations and leg swelling.  Gastrointestinal: Negative.  Negative for nausea, vomiting, diarrhea, constipation and blood in stool.  Genitourinary: Negative.  Negative for dysuria, hematuria and decreased urine volume.  Musculoskeletal: Positive for back pain (baseline). Negative for myalgias, joint swelling, arthralgias, gait problem and neck pain.  Neurological: Negative.  Negative for dizziness, seizures, facial asymmetry, speech difficulty, weakness, light-headedness, numbness and headaches.  Psychiatric/Behavioral: Negative.        Objective:   Physical Exam  Constitutional: He is oriented to person, place, and time. He appears well-developed and well-nourished.  Blood pressure 112/76, pulse 75, temperature 97.8 F (36.6 C), temperature source Oral, resp. rate 16, height  (1.727 m),  weight 237 lb (107.502 kg), SpO2 96 %.  HENT:  Head: Normocephalic and atraumatic.  Right Ear: Hearing, tympanic membrane, external ear and ear canal normal.  Left Ear: Hearing, tympanic membrane, external ear and ear canal normal.  Nose: Nose normal.  Mouth/Throat: Uvula is midline, oropharynx is clear and moist and mucous membranes are normal.  Eyes: Conjunctivae, EOM and lids are normal. Pupils are equal, round, and reactive to light. Right eye exhibits no discharge. Left eye exhibits no discharge. No scleral icterus.  Neck: Trachea normal and normal range of motion. Neck supple. Carotid bruit is not present.  Cardiovascular: Normal rate, regular rhythm, normal heart sounds, intact distal pulses and normal pulses.  Exam reveals no gallop and no friction rub.   No murmur heard. Pulmonary/Chest: Effort normal and breath sounds normal. No respiratory distress. He has no wheezes. He has no rhonchi. He has no rales.  Abdominal: Soft. Normal appearance and bowel sounds are normal. He exhibits no abdominal bruit. There is no tenderness.  Musculoskeletal: Normal range of motion. He exhibits no edema or tenderness.  Lymphadenopathy:       Head (right side): No submental, no submandibular, no tonsillar, no preauricular, no posterior auricular and no occipital adenopathy present.       Head (left side): No submental, no submandibular, no tonsillar, no preauricular, no posterior auricular and no occipital adenopathy present.    He has no cervical adenopathy.  Neurological: He is alert and oriented to person, place, and time. He has normal strength and normal reflexes. No cranial nerve deficit or sensory deficit. He exhibits normal muscle tone. Coordination and gait normal.  Skin: Skin is warm, dry and intact. No lesion and  no rash noted.  Psychiatric: He has a normal mood and affect. His speech is normal and behavior is normal. Judgment and thought content normal.       Assessment & Plan:  1.  Physical examination of employee One year card given. Continued f/u with PCP for HTN and COPD.   Janan Ridge PA-C  [facility] 01/06/2015 10:08 AM

## 2015-03-10 ENCOUNTER — Other Ambulatory Visit: Payer: Self-pay | Admitting: Physician Assistant

## 2015-04-01 ENCOUNTER — Other Ambulatory Visit: Payer: Self-pay | Admitting: Physician Assistant

## 2015-04-21 ENCOUNTER — Telehealth: Payer: Self-pay

## 2015-04-21 DIAGNOSIS — I10 Essential (primary) hypertension: Secondary | ICD-10-CM

## 2015-04-21 NOTE — Telephone Encounter (Signed)
Pt. Called to check the status of req.

## 2015-04-21 NOTE — Telephone Encounter (Signed)
[facility] [address] He has been trying since December.   [phone]

## 2015-04-21 NOTE — Telephone Encounter (Signed)
Left message for pt to call back  °

## 2015-04-22 MED ORDER — ATENOLOL-CHLORTHALIDONE 50-25 MG PO TABS: 1.0000 | ORAL_TABLET | Freq: Every day | ORAL | Status: DC

## 2015-04-22 NOTE — Telephone Encounter (Signed)
It appears he is to be taking the Tenoretic and Lisinopril. He received #90 lisinopril from [facility] last month, so he has that. The Tenoretic prescription written in June was for #30 with 5 RF, so he's out.  Meds ordered this encounter  Medications  . atenolol-chlorthalidone (TENORETIC) 50-25 MG tablet    Sig: Take 1 tablet by mouth daily.    Dispense:  90 tablet    Refill:  0    Order Specific Question:  Supervising Provider    Answer:  DOOLITTLE, ROBERT P [[id number]]

## 2015-04-22 NOTE — Telephone Encounter (Signed)
Please get name of the medication from pt. Left message for pt to call back.

## 2015-04-22 NOTE — Telephone Encounter (Signed)
atenolol-chlorthalidone (TENORETIC) 50-25 MG per tablet [[id number]]

## 2015-04-22 NOTE — Telephone Encounter (Signed)
Is he supposed to be taking this? His physical from [facility] on 01/06/2015 did not specify this. His medication list reports he is not Alger[place] and she advised him to follow up with his PCP. His PCP is listed as Dr. Perrin MalteseGuest. Please advise.

## 2015-04-27 ENCOUNTER — Ambulatory Visit (INDEPENDENT_AMBULATORY_CARE_PROVIDER_SITE_OTHER): Payer: Self-pay

## 2015-04-27 ENCOUNTER — Ambulatory Visit (INDEPENDENT_AMBULATORY_CARE_PROVIDER_SITE_OTHER): Payer: Self-pay | Admitting: Family Medicine

## 2015-04-27 VITALS — BP 144/84 | HR 70 | Temp 98.0°F | Resp 17 | Ht 68.0 in | Wt 236.0 lb

## 2015-04-27 DIAGNOSIS — J039 Acute tonsillitis, unspecified: Secondary | ICD-10-CM

## 2015-04-27 DIAGNOSIS — K1379 Other lesions of oral mucosa: Secondary | ICD-10-CM

## 2015-04-27 LAB — POCT CBC
GRANULOCYTE PERCENT: 61.5 % (ref 37–80)
HCT, POC: 47.2 % (ref 43.5–53.7)
Hemoglobin: 15.7 g/dL (ref 14.1–18.1)
Lymph, poc: 2.1 (ref 0.6–3.4)
MCH: 27 pg (ref 27–31.2)
MCHC: 33.2 g/dL (ref 31.8–35.4)
MCV: 81.4 fL (ref 80–97)
MID (cbc): 0.7 (ref 0–0.9)
MPV: 7.1 fL (ref 0–99.8)
PLATELET COUNT, POC: 319 10*3/uL (ref 142–424)
POC Granulocyte: 4.4 (ref 2–6.9)
POC LYMPH PERCENT: 29.4 %L (ref 10–50)
POC MID %: 9.1 %M (ref 0–12)
RBC: 5.8 M/uL (ref 4.69–6.13)
RDW, POC: 13.8 %
WBC: 7.2 10*3/uL (ref 4.6–10.2)

## 2015-04-27 LAB — POCT RAPID STREP A (OFFICE): RAPID STREP A SCREEN: NEGATIVE

## 2015-04-27 LAB — POCT SEDIMENTATION RATE: POCT SED RATE: 38 mm/hr — AB (ref 0–22)

## 2015-04-27 MED ORDER — METHYLPREDNISOLONE SODIUM SUCC 125 MG IJ SOLR
125.0000 mg | Freq: Once | INTRAMUSCULAR | Status: AC
  Administered 2015-04-27: 125 mg via INTRAMUSCULAR

## 2015-04-27 MED ORDER — CEPHALEXIN 500 MG PO CAPS: 500.0000 mg | ORAL_CAPSULE | Freq: Two times a day (BID) | ORAL | Status: DC

## 2015-04-27 NOTE — Progress Notes (Signed)
Subjective:  By signing my name below, I, Stann Ore, attest that this documentation has been prepared under the direction and in the presence of Norberto Sorenson, MD. Electronically Signed: Stann Ore, Scribe. 04/27/2015 , 9:00 AM .  Patient was seen in Room 9 .   Patient ID: Larry Dawson, male    DOB: [birth year], 60 y.o.   MRN: [id number] Chief Complaint  Patient presents with  . tonsil swelling   HPI HIROTO SALTZMAN is a 60 y.o. male who presents to [facility] complaining of tonsil swelling. Pt states that he notice his throat sore and itching yesterday. At night, he felt his tonsils swell up to where it became difficult to breath easily. He usually takes [facility] powder as soon as he feels anything like this coming on which has always provided sxs relief until this episode where it did not seem to work at all. He couldn't sleep because he described it felt like he was choking. He can't swallow and has difficulty speaking and breathing but he is able to speak in full sentences, is not getting winded, and was able to take all of his medications this morning inc vitamins w/o difficulty. He was able to drink fluids this morning but has not tried to eat. He also noticed some rhinorrhea, but believes to be changing of weather. 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 He denies tongue swelling, facial swelling, sinus cong, cough, fever, and chills.     Past Medical History  Diagnosis Date  . Allergy   . Hypertension   . Hydrocele   . COPD (chronic obstructive pulmonary disease) (HCC)     Albuterol PRN.  Marland Kitchen Anal fissure   . IBS (irritable bowel syndrome) 05/22/13    per chart note-pt denied   Prior to Admission medications   Medication Sig Start Date End Date Taking? Authorizing Provider  atenolol-chlorthalidone (TENORETIC) 50-25 MG tablet Take 1 tablet by mouth daily. 04/22/15  Yes Chelle Jeffery, PA-C  calcium-vitamin D (OSCAL WITH D) 500-200  MG-UNIT per tablet Take 1 tablet by mouth.   Yes Historical Provider, MD  cholecalciferol (VITAMIN D) 1000 units tablet Take 1,000 Units by mouth daily.   Yes Historical Provider, MD  lansoprazole (PREVACID) 15 MG capsule Take 15 mg by mouth as needed.    Yes Historical Provider, MD  lisinopril (PRINIVIL,ZESTRIL) 20 MG tablet TAKE ONE TABLET BY MOUTH DAILY 03/14/15  Yes Collie Siad English, PA  Omega-3 Fatty Acids (FISH OIL) 1000 MG CAPS Take by mouth.   Yes Historical Provider, MD  vitamin C (ASCORBIC ACID) 500 MG tablet Take 500 mg by mouth daily.   Yes Historical Provider, MD   Allergies  Allergen Reactions  . Penicillins Hives, Rash and Other (See Comments)    Throat swells  . Latex Hives, Swelling and Rash    Review of Systems  Constitutional: Positive for appetite change and fatigue. Negative for fever, chills, diaphoresis and activity change.  HENT: Positive for rhinorrhea, sore throat, trouble swallowing and voice change. Negative for congestion, ear pain, facial swelling, mouth sores, postnasal drip, sinus pressure and sneezing.   Respiratory: Positive for choking (laying down). Negative for cough, shortness of breath and wheezing.   Neurological: Negative for facial asymmetry.  Hematological: Negative for adenopathy.  Psychiatric/Behavioral: Positive for sleep disturbance.       Objective:   Physical Exam  Constitutional: He is oriented to person, place, and time. He  appears well-developed and well-nourished. No distress.  HENT:  Head: Normocephalic and atraumatic.  Right Ear: Tympanic membrane normal.  Left Ear: Tympanic membrane normal.  Mouth/Throat: Posterior oropharyngeal erythema (severe erythema) present.  difficult to visualize, 3+ tonsils touching uvula, which also appears swollen as well  Eyes: EOM are normal. Pupils are equal, round, and reactive to light.  Neck: Neck supple. No thyromegaly present.  Cardiovascular: Normal rate, regular rhythm, S1 normal, S2  normal and normal heart sounds.   No murmur heard. Pulmonary/Chest: Effort normal and breath sounds normal. No respiratory distress. He has no wheezes.  Musculoskeletal: Normal range of motion.  Lymphadenopathy:       Head (right side): Tonsillar adenopathy present.       Head (left side): Tonsillar adenopathy present.    He has cervical adenopathy (anterior).  Neurological: He is alert and oriented to person, place, and time.  Skin: Skin is warm and dry.  Psychiatric: He has a normal mood and affect. His behavior is normal.  Nursing note and vitals reviewed.   BP 144/84 mmHg  Pulse 70  Temp(Src) 98 F (36.7 C) (Oral)  Resp 17  Ht $RemoveBefor m)  Wt 236 lb (107.049 kg)  BMI 35.89 kg/m2  SpO2 97%  [facility] reading (PRIMARY) by Dr. Clelia Croft : neck xray: positive soft tissue edema and airway narrowing around upper epiglottis immediately above hyoid     Assessment & Plan:   1. Acute tonsillitis, unspecified etiology   2. Uvular edema   Suspect infectious etiology due to location and proceeded by itchy/scatchy sensation in throat yest but as sxs are so isolated to the pharynx and pt denies feeling ill at all I am concerned this could be an atypical angioedema from his lisinopril.  Will stop lisinopril.  Gave solumedrol 125 mg IM x 1 in office and start keflex to cover for strep (pcn -> hives but pt had ancef in 2015 w/o rxn.)  Pt not living alone and agrees that if worsening AT ALL will call 911 and to ER.  If improving, f/u w/ Dr. Neva Seat in 24 hrs.   Orders Placed This Encounter  Procedures  . Culture, Group A Strep    Order Specific Question:  Source    Answer:  throat  . DG Neck Soft Tissue    Standing Status: Future     Number of Occurrences: 1     Standing Expiration Date: 06/24/2016    Order Specific Question:  Reason for Exam (SYMPTOM  OR DIAGNOSIS REQUIRED)    Answer:  acute tonsillar edema - eval for epiglottis swellin    Order Specific Question:  Preferred imaging location?      Answer:  External  . POCT CBC  . POCT SEDIMENTATION RATE  . POCT rapid strep A    Meds ordered this encounter  Medications  . cholecalciferol (VITAMIN D) 1000 units tablet    Sig: Take 1,000 Units by mouth daily.  . Omega-3 Fatty Acids (FISH OIL) 1000 MG CAPS    Sig: Take by mouth.  . methylPREDNISolone sodium succinate (SOLU-MEDROL) 125 mg/2 mL injection 125 mg    Sig:   . cephALEXin (KEFLEX) 500 MG capsule    Sig: Take 1 capsule (500 mg total) by mouth 2 (two) times daily.    Dispense:  14 capsule    Refill:  0    I personally performed the services described in this documentation, which was scribed in my presence. The recorded information has been reviewed  and considered, and addended by me as needed.  Delman Cheadle, MD MPH

## 2015-04-28 ENCOUNTER — Ambulatory Visit (INDEPENDENT_AMBULATORY_CARE_PROVIDER_SITE_OTHER): Payer: Self-pay | Admitting: Family Medicine

## 2015-04-28 ENCOUNTER — Ambulatory Visit (INDEPENDENT_AMBULATORY_CARE_PROVIDER_SITE_OTHER): Payer: Self-pay

## 2015-04-28 VITALS — BP 124/72 | HR 61 | Temp 98.1°F | Resp 18 | Ht 68.0 in | Wt 237.0 lb

## 2015-04-28 DIAGNOSIS — J029 Acute pharyngitis, unspecified: Secondary | ICD-10-CM

## 2015-04-28 DIAGNOSIS — K1379 Other lesions of oral mucosa: Secondary | ICD-10-CM

## 2015-04-28 DIAGNOSIS — T783XXD Angioneurotic edema, subsequent encounter: Secondary | ICD-10-CM

## 2015-04-28 MED ORDER — PREDNISONE 20 MG PO TABS: 40.0000 mg | ORAL_TABLET | Freq: Every day | ORAL | Status: DC

## 2015-04-28 NOTE — Patient Instructions (Signed)
I suspect the lisinopril may have caused her symptoms. As it is improving, no other imaging needed today. Do not take lisinopril or ACE inhibitor's in the future. Check your blood pressure once or twice per day, and if reading over 140/90, call us as you may need to be on additional medication. For the next 5 days, I would like you to take prednisone to continue to lessen the swelling in the back of the throat, but if any increased scratchy throat, tightness in the throat, difficulty breathing, fevers, or any worsening symptoms, return here or the emergency room right away.  Angioedema Angioedema is a sudden swelling of tissues, often of the skin. It can occur on the face or genitals or in the abdomen or other body parts. The swelling usually develops over a short period and gets better in 24 to 48 hours. It often begins during the night and is found when the person wakes up. The person may also get red, itchy patches of skin (hives). Angioedema can be dangerous if it involves swelling of the air passages.  Depending on the cause, episodes of angioedema may only happen once, come back in unpredictable patterns, or repeat for several years and then gradually fade away.  CAUSES  Angioedema can be caused by an allergic reaction to various triggers. It can also result from nonallergic causes, including reactions to drugs, immune system disorders, viral infections, or an abnormal gene that is passed to you from your parents (hereditary). For some people with angioedema, the cause is unknown.  Some things that can trigger angioedema include:   Foods.   Medicines, such as ACE inhibitors, ARBs, nonsteroidal anti-inflammatory agents, or estrogen.   Latex.   Animal saliva.   Insect stings.   Dyes used in X-rays.   Mild injury.   Dental work.  Surgery.  Stress.   Sudden changes in temperature.   Exercise. SIGNS AND SYMPTOMS   Swelling of the skin.  Hives. If these are present, there  is also intense itching.  Redness in the affected area.   Pain in the affected area.  Swollen lips or tongue.  Breathing problems. This may happen if the air passages swell.  Wheezing. If internal organs are involved, there may be:   Nausea.   Abdominal pain.   Vomiting.   Difficulty swallowing.   Difficulty passing urine. DIAGNOSIS   Your health care provider will examine the affected area and take a medical and family history.  Various tests may be done to help determine the cause. Tests may include:  Allergy skin tests to see if the problem is an allergic reaction.   Blood tests to check for hereditary angioedema.   Tests to check for underlying diseases that could cause the condition.   A review of your medicines, including over-the-counter medicines, may be done. TREATMENT  Treatment will depend on the cause of the angioedema. Possible treatments include:   Removal of anything that triggered the condition (such as stopping certain medicines).   Medicines to treat symptoms or prevent attacks. Medicines given may include:   Antihistamines.   Epinephrine injection.   Steroids.   Hospitalization may be required for severe attacks. If the air passages are affected, it can be an emergency. Tubes may need to be placed to keep the airway open. HOME CARE INSTRUCTIONS   Take all medicines as directed by your health care provider.  If you were given medicines for emergency allergy treatment, always carry them with you.  Wear a  medical bracelet as directed by your health care provider.   Avoid known triggers. SEEK MEDICAL CARE IF:   You have repeat attacks of angioedema.   Your attacks are more frequent or more severe despite preventive measures.   You have hereditary angioedema and are considering having children. It is important to discuss with your health care provider the risks of passing the condition on to your children. SEEK IMMEDIATE  MEDICAL CARE IF:   You have severe swelling of the mouth, tongue, or lips.  You have difficulty breathing.   You have difficulty swallowing.   You faint. MAKE SURE YOU:  Understand these instructions.  Will watch your condition.  Will get help right away if you are not doing well or get worse.   This information is not intended to replace advice given to you by your health care provider. Make sure you discuss any questions you have with your health care provider.   Document Released: 06/04/2001 Document Revised: 04/16/2014 Document Reviewed: 11/17/2012 Elsevier Interactive Patient Education [employer]

## 2015-04-28 NOTE — Progress Notes (Signed)
Subjective:    Patient ID: Larry Dawson, male    DOB: [birth year], 60 y.o.   MRN: [id number] By signing my name below, I, [name], attest that this documentation has been prepared under the direction and in the presence of Meredith Staggers, MD.  Electronically Signed: [name], Medical Scribe. 04/28/2015. 9:29 AM.  HPI HPI Comments: Larry Dawson is a 60 y.o. male who presents to the [facility] for a follow-up.  See note from Dr. Clelia Croft yesterday. Sore throat 2 days ago, then some difficulty breathing with tonsils swelling 2 nights ago. Uvula swelling and erythema on oropharynx, but was able to speak and clear secretions normally. He did have a soft tissue neck XR showing some swelling in the retropharyngeal space and epiglottic folds. He was given Solu-Medrol 125 mg injection and started on Keflex to cover for possible strep. He was taking lisinopril, so this was discontinued in case this was atypical angioedema. He did have a slightly elevated sed rate of 38, but a normal CBC. Throat culture is still pending.  Patient states he started feeling better by yesterday afternoon and was able to sleep fine last night. He has taken 2 doses of the antibiotics so far. He has not taken any lisinopril since yesterday morning. Patient denies rash, itchy/scratchy throat, fever, headache, and difficulty swallowing. He states he has had prior episodes of throat swelling which he had been able to resolve by swishing BC powder in his mouth, but this did not work this time.  Review of Systems  Constitutional: Negative for fever.  HENT: Negative for sore throat and trouble swallowing.   Respiratory: Negative for shortness of breath.   Skin: Negative for rash.  Neurological: Negative for headaches.       Objective:   Physical Exam  Constitutional: He is oriented to person, place, and time. He appears well-developed and well-nourished. No distress.  HENT:  Head: Normocephalic and  atraumatic.  Mouth/Throat: Oropharynx is clear and moist. No oropharyngeal exudate.  Moist oral mucosa. No significant erythema in posterior oropharynx. Uvula is near normal size. No ulcers.  Eyes: Pupils are equal, round, and reactive to light.  Neck: Neck supple.  Speaking normally.  Cardiovascular: Normal rate.   Pulmonary/Chest: Effort normal. No stridor. He has no wheezes. He has no rhonchi. He has no rales.  Clear to auscultation bilaterally.   Musculoskeletal: He exhibits no edema.  Neurological: He is alert and oriented to person, place, and time. No cranial nerve deficit.  Skin: Skin is warm and dry. No rash noted.  Psychiatric: He has a normal mood and affect. His behavior is normal.  Nursing note and vitals reviewed.   Filed Vitals:   04/28/15 0858  BP: 124/72  Pulse: 61  Temp: 98.1 F (36.7 C)  TempSrc: Oral  Resp: 18  Height:  (1.727 m)  Weight: 237 lb (107.502 kg)  SpO2: 97%    [facility] reading (PRIMARY) by  Dr. Neva Seat: Soft tissue neck/one view. Appears to have slight decreased retropharyngeal edema. No new findings..       Assessment & Plan:   [name] is a 60 y.o. male Sore throat - Plan: DG Neck Soft Tissue  Uvular swelling - Plan: DG Neck Soft Tissue  Angioedema, subsequent encounter - Plan: predniSONE (DELTASONE) 20 MG tablet  Significantly improved today. Denies any further sore throat, difficulty swallowing, scratchy throat since yesterday afternoon. Did receive Solu-Medrol  1 yesterday, last lisinopril yesterday morning.   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 Will continue to avoid lisinopril or any ACE inhibitor.  - prednisone 40 mg daily 5 days.  - RTC/911/ER precautions given if any return of throat or swelling symptoms. Understanding expressed.  Meds ordered this encounter  Medications  . aspirin 81 MG tablet    Sig: Take 81 mg by  mouth daily.  . predniSONE (DELTASONE) 20 MG tablet    Sig: Take 2 tablets (40 mg total) by mouth daily with breakfast.    Dispense:  10 tablet    Refill:  0   Patient Instructions  I suspect the lisinopril may have caused her symptoms. As it is improving, no other imaging needed today. Do not take lisinopril or ACE inhibitor's in the future. Check your blood pressure once or twice per day, and if reading over 140/90, call us as you may need to be on additional medication. For the next 5 days, I would like you to take prednisone to continue to lessen the swelling in the back of the throat, but if any increased scratchy throat, tightness in the throat, difficulty breathing, fevers, or any worsening symptoms, return here or the emergency room right away.  Angioedema Angioedema is a sudden swelling of tissues, often of the skin. It can occur on the face or genitals or in the abdomen or other body parts. The swelling usually develops over a short period and gets better in 24 to 48 hours. It often begins during the night and is found when the person wakes up. The person may also get red, itchy patches of skin (hives). Angioedema can be dangerous if it involves swelling of the air passages.  Depending on the cause, episodes of angioedema may only happen once, come back in unpredictable patterns, or repeat for several years and then gradually fade away.  CAUSES  Angioedema can be caused by an allergic reaction to various triggers. It can also result from nonallergic causes, including reactions to drugs, immune system disorders, viral infections, or an abnormal gene that is passed to you from your parents (hereditary). For some people with angioedema, the cause is unknown.  Some things that can trigger angioedema include:   Foods.   Medicines, such as ACE inhibitors, ARBs, nonsteroidal anti-inflammatory agents, or estrogen.   Latex.   Animal saliva.   Insect stings.   Dyes used in X-rays.    Mild injury.   Dental work.  Surgery.  Stress.   Sudden changes in temperature.   Exercise. SIGNS AND SYMPTOMS   Swelling of the skin.  Hives. If these are present, there is also intense itching.  Redness in the affected area.   Pain in the affected area.  Swollen lips or tongue.  Breathing problems. This may happen if the air passages swell.  Wheezing. If internal organs are involved, there may be:   Nausea.   Abdominal pain.   Vomiting.   Difficulty swallowing.   Difficulty passing urine. DIAGNOSIS   Your health care provider will examine the affected area and take a medical and family history.  Various tests may be done to help determine the cause. Tests may include:  Allergy skin tests to see if the problem is an allergic reaction.   Blood tests to check for hereditary angioedema.   Tests to check for underlying diseases that could cause the condition.   A review of your medicines, including over-the-counter  medicines, may be done. TREATMENT  Treatment will depend on the cause of the angioedema. Possible treatments include:   Removal of anything that triggered the condition (such as stopping certain medicines).   Medicines to treat symptoms or prevent attacks. Medicines given may include:   Antihistamines.   Epinephrine injection.   Steroids.   Hospitalization may be required for severe attacks. If the air passages are affected, it can be an emergency. Tubes may need to be placed to keep the airway open. HOME CARE INSTRUCTIONS   Take all medicines as directed by your health care provider.  If you were given medicines for emergency allergy treatment, always carry them with you.  Wear a medical bracelet as directed by your health care provider.   Avoid known triggers. SEEK MEDICAL CARE IF:   You have repeat attacks of angioedema.   Your attacks are more frequent or more severe despite preventive measures.   You  have hereditary angioedema and are considering having children. It is important to discuss with your health care provider the risks of passing the condition on to your children. SEEK IMMEDIATE MEDICAL CARE IF:   You have severe swelling of the mouth, tongue, or lips.  You have difficulty breathing.   You have difficulty swallowing.   You faint. MAKE SURE YOU:  Understand these instructions.  Will watch your condition.  Will get help right away if you are not doing well or get worse.   This information is not intended to replace advice given to you by your health care provider. Make sure you discuss any questions you have with your health care provider.   Document Released: 06/04/2001 Document Revised: 04/16/2014 Document Reviewed: 11/17/2012 Elsevier Interactive Patient Education [employer]     I personally performed the services described in this documentation, which was scribed in my presence. The recorded information has been reviewed and considered, and addended by me as needed.

## 2015-04-29 LAB — CULTURE, GROUP A STREP: Organism ID, Bacteria: NORMAL

## 2015-05-30 ENCOUNTER — Other Ambulatory Visit: Payer: Self-pay | Admitting: Family Medicine

## 2015-05-30 DIAGNOSIS — R490 Dysphonia: Secondary | ICD-10-CM

## 2015-05-30 DIAGNOSIS — R9389 Abnormal findings on diagnostic imaging of other specified body structures: Secondary | ICD-10-CM

## 2015-07-26 ENCOUNTER — Other Ambulatory Visit: Payer: Self-pay | Admitting: Physician Assistant

## 2015-09-02 ENCOUNTER — Other Ambulatory Visit: Payer: Self-pay | Admitting: Physician Assistant

## 2015-10-06 ENCOUNTER — Telehealth: Payer: Self-pay

## 2015-10-06 NOTE — Telephone Encounter (Signed)
What is pt's f/up plan? Hasn't been seen for HTN for a year.

## 2015-10-06 NOTE — Telephone Encounter (Signed)
Pt is needing a refill on blood pressure medication  Best number [phone]

## 2015-10-13 ENCOUNTER — Other Ambulatory Visit: Payer: Self-pay

## 2015-10-13 MED ORDER — ATENOLOL-CHLORTHALIDONE 50-25 MG PO TABS: 1.0000 | ORAL_TABLET | Freq: Every day | ORAL | Status: DC

## 2015-10-13 NOTE — Telephone Encounter (Signed)
Pt stated that he will try to get in w/in 30 days for another check up. He stated that he discussed HTN w/provider when he was here for his sore throat in Jan. I advise pt I will send in 1 RF to give him time to get back in.

## 2015-11-19 ENCOUNTER — Encounter: Payer: Self-pay | Admitting: Physician Assistant

## 2015-11-19 ENCOUNTER — Ambulatory Visit (INDEPENDENT_AMBULATORY_CARE_PROVIDER_SITE_OTHER): Payer: Self-pay | Admitting: Physician Assistant

## 2015-11-19 VITALS — BP 164/94 | HR 78 | Temp 98.4°F | Resp 17 | Ht 68.5 in | Wt 231.0 lb

## 2015-11-19 DIAGNOSIS — E7800 Pure hypercholesterolemia, unspecified: Secondary | ICD-10-CM

## 2015-11-19 DIAGNOSIS — I10 Essential (primary) hypertension: Secondary | ICD-10-CM

## 2015-11-19 DIAGNOSIS — Z114 Encounter for screening for human immunodeficiency virus [HIV]: Secondary | ICD-10-CM

## 2015-11-19 DIAGNOSIS — Z1159 Encounter for screening for other viral diseases: Secondary | ICD-10-CM

## 2015-11-19 LAB — LIPID PANEL
Cholesterol: 185 mg/dL (ref 125–200)
HDL: 56 mg/dL (ref >=40)
LDL CALC: 117 mg/dL (ref <130)
TRIGLYCERIDES: 61 mg/dL (ref <150)
Total CHOL/HDL Ratio: 3.3 Ratio (ref <=5.0)
VLDL: 12 mg/dL (ref <30)

## 2015-11-19 LAB — COMPLETE METABOLIC PANEL WITH GFR
ALT: 27 U/L (ref 9–46)
AST: 18 U/L (ref 10–35)
Albumin: 3.9 g/dL (ref 3.6–5.1)
Alkaline Phosphatase: 45 U/L (ref 40–115)
BILIRUBIN TOTAL: 0.9 mg/dL (ref 0.2–1.2)
BUN: 14 mg/dL (ref 7–25)
CO2: 27 mmol/L (ref 20–31)
CREATININE: 1.27 mg/dL (ref 0.70–1.33)
Calcium: 9 mg/dL (ref 8.6–10.3)
Chloride: 105 mmol/L (ref 98–110)
GFR, EST AFRICAN AMERICAN: 71 mL/min (ref >=60)
GFR, Est Non African American: 61 mL/min (ref >=60)
GLUCOSE: 90 mg/dL (ref 65–99)
Potassium: 3.9 mmol/L (ref 3.5–5.3)
Sodium: 140 mmol/L (ref 135–146)
TOTAL PROTEIN: 6.9 g/dL (ref 6.1–8.1)

## 2015-11-19 LAB — HEPATITIS C ANTIBODY: HCV AB: NEGATIVE

## 2015-11-19 MED ORDER — ATENOLOL-CHLORTHALIDONE 50-25 MG PO TABS: 1.0000 | ORAL_TABLET | Freq: Every day | ORAL | 2 refills | Status: DC

## 2015-11-19 NOTE — Progress Notes (Signed)
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

## 2015-11-19 NOTE — Patient Instructions (Addendum)
  Zyrtec to help with fluid in your ears - DO NOT get the Zyrtec D -- this will help your allergies  Use the coupon for making your medication cheaper at [employer]   IF you received an x-ray today, you will receive an [facility]. Please contact [facility] at [phone] with questions or concerns regarding your invoice.   IF you received labwork today, you will receive an invoice from [facility]. Please contact [facility] at [phone] with questions or concerns regarding your invoice.   Our billing staff will not be able to assist you with questions regarding bills from these companies.  You will be contacted with the lab results as soon as they are available. The fastest way to get your results is to activate your My Chart account. Instructions are located on the last page of this paperwork. If you have not heard from us regarding the results in 2 weeks, please contact this office.

## 2015-11-19 NOTE — Progress Notes (Signed)
Larry Dawson  MRN: [id number] DOB: [birth year]  Subjective:  Pt presents to clinic for medication refill.  He has been on his medication without problems but he did run out about 2 weeks ago.  He checks his BP at home and it typically runs in the 130/80s.  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  Review of Systems  Respiratory: Negative for cough and shortness of breath.   Cardiovascular: Negative for chest pain, palpitations and leg swelling.    Patient Active Problem List   Diagnosis Date Noted  . Hyperglycemia 07/02/2013  . Sebaceous cyst 11/30/2012  . COPD (chronic obstructive pulmonary disease) (HCC) 11/30/2012  . Balanitis 12/31/2011  . HTN (hypertension) 07/18/2011    Current Outpatient Prescriptions on File Prior to Visit  Medication Sig Dispense Refill  . aspirin 81 MG tablet Take 81 mg by mouth daily.    . calcium-vitamin D (OSCAL WITH D) 500-200 MG-UNIT per tablet Take 1 tablet by mouth.    . cephALEXin (KEFLEX) 500 MG capsule Take 1 capsule (500 mg total) by mouth 2 (two) times daily. 14 capsule 0  . cholecalciferol (VITAMIN D) 1000 units tablet Take 1,000 Units by mouth daily.    . lansoprazole (PREVACID) 15 MG capsule Take 15 mg by mouth as needed.     . Omega-3 Fatty Acids (FISH OIL) 1000 MG CAPS Take by mouth.    . predniSONE (DELTASONE) 20 MG tablet Take 2 tablets (40 mg total) by mouth daily with breakfast. 10 tablet 0  . vitamin C (ASCORBIC ACID) 500 MG tablet Take 500 mg by mouth daily.     No current facility-administered medications on file prior to visit.     Allergies  Allergen Reactions  . Ace Inhibitors Swelling    Suspected angioedema  . Penicillins Hives, Rash and Other (See Comments)    Throat swells  . Latex Hives, Swelling and Rash    Pt patients past, family and social history were reviewed and updated.  Objective:    BP (!) 164/94 (BP Location: Right Arm, Patient Position: Sitting, Cuff Size: Normal)   Pulse 78   Temp 98.4 F (36.9 C) (Oral)   Resp 17   Ht 5' 8.5" (1.74 m)   Wt 231 lb (104.8 kg)   SpO2 97%   BMI 34.61 kg/m   Physical Exam  Constitutional: He is oriented to person, place, and time and well-developed, well-nourished, and in no distress.  HENT:  Head: Normocephalic and atraumatic.  Right Ear: External ear normal.  Left Ear: External ear normal.  Eyes: Conjunctivae are normal.  Neck: Normal range of motion.  Cardiovascular: Normal rate, regular rhythm, normal heart sounds and intact distal pulses.   Pulmonary/Chest: Effort normal and breath sounds normal. He has no wheezes.  Musculoskeletal:       Right lower leg: He exhibits no edema.       Left lower leg: He exhibits no edema.  Neurological: He is alert and oriented to person, place, and time. Gait normal.  Skin: Skin is warm and dry.  Psychiatric: Mood, memory, affect and judgment normal.    Assessment and Plan :  Essential hypertension - Plan: COMPLETE METABOLIC PANEL WITH GFR, atenolol-chlorthalidone (TENORETIC) 50-25 MG tablet - restart medication - pt to RTC in 2-3 weeks for a recheck and continue at home BP  checks -   Elevated cholesterol - Plan: Lipid panel  Encounter for screening for HIV - Plan: HIV antibody  Need for hepatitis C screening test - Plan: Hepatitis C antibody  Benny LennertSarah Korey Prashad PA-C  [facility] 11/21/2015 9:18 PM

## 2015-11-19 NOTE — Patient Instructions (Signed)
     IF you received an x-ray today, you will receive an invoice from [facility]. Please contact [facility] at [phone] with questions or concerns regarding your invoice.   IF you received labwork today, you will receive an invoice from [facility]. Please contact [facility] at [phone] with questions or concerns regarding your invoice.   Our billing staff will not be able to assist you with questions regarding bills from these companies.  You will be contacted with the lab results as soon as they are available. The fastest way to get your results is to activate your My Chart account. Instructions are located on the last page of this paperwork. If you have not heard from us regarding the results in 2 weeks, please contact this office.      

## 2015-11-20 LAB — HIV ANTIBODY (ROUTINE TESTING W REFLEX): HIV 1&2 Ab, 4th Generation: NONREACTIVE

## 2015-11-21 ENCOUNTER — Encounter: Payer: Self-pay | Admitting: Physician Assistant

## 2015-12-28 ENCOUNTER — Ambulatory Visit (INDEPENDENT_AMBULATORY_CARE_PROVIDER_SITE_OTHER): Payer: Self-pay | Admitting: Physician Assistant

## 2015-12-28 VITALS — BP 126/80 | HR 88 | Temp 98.1°F | Resp 16 | Ht 68.0 in | Wt 236.0 lb

## 2015-12-28 DIAGNOSIS — Z021 Encounter for pre-employment examination: Secondary | ICD-10-CM

## 2015-12-28 DIAGNOSIS — Z0289 Encounter for other administrative examinations: Secondary | ICD-10-CM

## 2015-12-28 NOTE — Progress Notes (Signed)
Patient ID: Larry Dawson, male   DOB: [birth year], 60 y.o.   MRN: [id number] [facility] [address] [place] [address] [phone]  By signing my name below I, Shelah Lewandowsky, attest that this documentation has been prepared under the direction and in the presence of Trena Platt PA. Electonically Signed. Shelah Lewandowsky, Scribe 12/28/2015 at 3:24 PM  Date:  12/28/2015   Name:  Larry Dawson   DOB:  [birth year]   MRN:  [id number]  PCP:  Tally Due, MD    History of Present Illness:  Larry Dawson is a 60 y.o. 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   Pt reports that he is having some ear drainage due to allergies. Pt takes allergy medication everyday.   Pt states that he lost 30% of his lung function due to smoking when he was younger. Pt smoke a pack a day for 15 years.   Pt reports history of one bulging disc and DDD.  Pt had a vasectomy, hernia repair, and hydrocelectomy. Pt reports he went to the hospital after he died his hair and had a skin reaction in his scalp.   Pt has broken both ankles.   Pt states he tries to exercise regularly and will walk 2-3 miles a few times a week, most weeks.    Patient Active Problem List   Diagnosis Date Noted   Hyperglycemia 07/02/2013   Sebaceous cyst 11/30/2012   COPD (chronic obstructive pulmonary disease) (HCC) 11/30/2012   Balanitis 12/31/2011   HTN (hypertension) 07/18/2011    Past Medical History:  Diagnosis Date   Allergy    Anal fissure    COPD (chronic obstructive pulmonary disease) (HCC)    Albuterol PRN.   Hydrocele    Hypertension    IBS (irritable bowel syndrome) 05/22/13   per chart note-pt denied    Past Surgical History:  Procedure Laterality Date   HERNIA REPAIR  prior to 2013   HYDROCELE EXCISION Bilateral 11/23/2013   Procedure: HYDROCELECTOMY ADULT;  Surgeon: Valetta Fuller, MD;  Location: [facility];  Service: Urology;   Laterality: Bilateral;   VASECTOMY  prior to 2013    Social History  Substance Use Topics   Smoking status: Former Smoker   Smokeless tobacco: Never Used   Alcohol use No    Family History  Problem Relation Age of Onset   Diabetes Father    Hypertension Father    Diabetes Brother    Hypertension Brother    Diabetes Brother    Diabetes Brother     Allergies  Allergen Reactions   Ace Inhibitors Swelling    Suspected angioedema   Penicillins Hives, Rash and Other (See Comments)    Throat swells   Latex Hives, Swelling and Rash    Medication list has been reviewed and updated.  Current Outpatient Prescriptions on File Prior to Visit  Medication Sig Dispense Refill   aspirin 81 MG tablet Take 81 mg by mouth daily.     atenolol-chlorthalidone (TENORETIC) 50-25 MG tablet Take 1 tablet by mouth daily. 30 tablet 2   calcium-vitamin D (OSCAL WITH D) 500-200 MG-UNIT per tablet Take 1 tablet by mouth.     cholecalciferol (VITAMIN D) 1000 units tablet Take 1,000 Units by mouth daily.     lansoprazole (PREVACID) 15 MG capsule Take 15 mg by mouth as needed.      Omega-3 Fatty Acids (FISH OIL) 1000 MG CAPS Take  by mouth.     vitamin C (ASCORBIC ACID) 500 MG tablet Take 500 mg by mouth daily.     No current facility-administered medications on file prior to visit.     ROS ROS unremarkable unless otherwise specified.  Physical Examination: BP 126/80 (BP Location: Right Arm, Patient Position: Sitting, Cuff Size: Large)    Pulse 88    Temp 98.1 F (36.7 C) (Oral)    Resp 16    Ht 5\' 8"  (1.727 m)    Wt 236 lb (107 kg)    SpO2 97%    BMI 35.88 kg/m    Physical Exam  Constitutional: He is oriented to person, place, and time. He appears well-developed and well-nourished. No distress.  HENT:  Head: Normocephalic and atraumatic.  Eyes: Conjunctivae and EOM are normal. Pupils are equal, round, and reactive to light.  Cardiovascular: Normal rate, regular rhythm and  normal heart sounds.  Exam reveals no gallop and no friction rub.   No murmur heard. Pulses:      Dorsalis pedis pulses are 2+ on the right side, and 2+ on the left side.       Posterior tibial pulses are 2+ on the right side, and 2+ on the left side.  Pulmonary/Chest: Effort normal and breath sounds normal. No accessory muscle usage. No respiratory distress. He has no decreased breath sounds. He has no wheezes. He has no rhonchi. He has no rales.  Abdominal: Soft. Normal appearance and bowel sounds are normal. He exhibits no distension and no mass. There is no hepatosplenomegaly. There is no tenderness. There is no rigidity, no rebound and no guarding. Hernia confirmed negative in the right inguinal area and confirmed negative in the left inguinal area.  Genitourinary: Penis normal. Right testis shows no swelling and no tenderness. Left testis shows no swelling and no tenderness.  Lymphadenopathy: No inguinal adenopathy noted on the right or left side.  Neurological: He is alert and oriented to person, place, and time. He has normal strength. No cranial nerve deficit or sensory deficit. He displays a negative Romberg sign.  Pt has normal heel and toe walk.   Skin: Skin is warm and dry. He is not diaphoretic.  Psychiatric: He has a normal mood and affect. His behavior is normal.    Assessment and Plan: Larry KubaKarl H Glosser is a 60 y.o. 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  Trena PlattStephanie English, PA-C [facility] 12/28/2015 2:58 PM

## 2016-01-04 ENCOUNTER — Telehealth: Payer: Self-pay

## 2016-01-08 MED ORDER — FLUTICASONE PROPIONATE 50 MCG/ACT NA SUSP: 2.0000 | Freq: Every day | NASAL | 12 refills | Status: DC

## 2016-01-08 MED ORDER — CETIRIZINE HCL 10 MG PO TABS: 10.0000 mg | ORAL_TABLET | Freq: Every day | ORAL | 11 refills | Status: DC

## 2016-01-08 NOTE — Telephone Encounter (Signed)
Medication sent. Zyrtec allergy med and flonase nasal spray

## 2016-01-09 NOTE — Telephone Encounter (Signed)
Left message that zyrtec and flonase called to pharmacy.

## 2016-03-09 ENCOUNTER — Telehealth: Payer: Self-pay

## 2016-03-09 NOTE — Telephone Encounter (Signed)
Pt states that he called about 3 weeks ago but no notation was put in his chart. Is interested in getting a rx for Cialis. He has discussed with Judeth CornfieldStephanie during his last visit. Would like a call back regarding her decision. Marked as high priority due to no notation being made during first call.

## 2016-03-10 NOTE — Telephone Encounter (Signed)
He was here for a DOT physical, Cialis is not indicated as part of a DOT physical, he will need appointment to discuss this in the office. I have called him to advise. Left message for him to call for appointment.

## 2016-03-20 ENCOUNTER — Other Ambulatory Visit: Payer: Self-pay | Admitting: Physician Assistant

## 2016-03-20 DIAGNOSIS — I10 Essential (primary) hypertension: Secondary | ICD-10-CM

## 2016-03-23 ENCOUNTER — Telehealth: Payer: Self-pay

## 2016-03-23 NOTE — Telephone Encounter (Signed)
Patient requested a refill for atenolol and wants to know why it was denied. He was just here 3 months ago. Please advise!  [phone]

## 2016-03-24 ENCOUNTER — Ambulatory Visit (INDEPENDENT_AMBULATORY_CARE_PROVIDER_SITE_OTHER): Payer: Self-pay | Admitting: Physician Assistant

## 2016-03-24 VITALS — BP 130/82 | HR 76 | Temp 98.1°F | Resp 18 | Ht 68.0 in | Wt 240.0 lb

## 2016-03-24 DIAGNOSIS — M545 Low back pain, unspecified: Secondary | ICD-10-CM

## 2016-03-24 DIAGNOSIS — I10 Essential (primary) hypertension: Secondary | ICD-10-CM

## 2016-03-24 MED ORDER — CYCLOBENZAPRINE HCL 10 MG PO TABS: 5.0000 mg | ORAL_TABLET | Freq: Three times a day (TID) | ORAL | 0 refills | Status: DC | PRN

## 2016-03-24 MED ORDER — MELOXICAM 15 MG PO TABS: 7.5000 mg | ORAL_TABLET | Freq: Every day | ORAL | 3 refills | Status: AC

## 2016-03-24 MED ORDER — ATENOLOL-CHLORTHALIDONE 50-25 MG PO TABS: 1.0000 | ORAL_TABLET | Freq: Every day | ORAL | 2 refills | Status: DC

## 2016-03-24 NOTE — Progress Notes (Signed)
03/24/2016 12:19 PM   DOB: [birth year] / MRN: [id number]  SUBJECTIVE:  Larry Dawson is a 60 y.o. male presenting for blood pressure medication.  He is taking atenolol-chlorthalidone 50-25. He has has been out for 5 days now.  He does not check his BP at home.  He denies any dizziness at home.   Has a history of chronic back pain that worsened with stepping out of his truck 3 days ago.  He taken tylenol 2 daily and did not help. He does have a history of radicular symptoms in his upper left thigh and says this has also been worse. No incontinence.    He is allergic to ace inhibitors; penicillins; and latex.   He  has a past medical history of Allergy; Anal fissure; COPD (chronic obstructive pulmonary disease) ([place]); Hydrocele; Hypertension; and IBS (irritable bowel syndrome) (05/22/13).    He  reports that he has quit smoking. He has never used smokeless tobacco. He reports that he does not drink alcohol or use drugs. He  reports that he does not engage in sexual activity. The patient  has a past surgical history that includes Vasectomy (prior to 2013); Hernia repair (prior to 2013); and Hydrocele surgery (Bilateral, 11/23/2013).  His family history includes Diabetes in his brother, brother, brother, and father; Hypertension in his brother and father.  Review of Systems  Respiratory: Negative for cough and shortness of breath.   Cardiovascular: Negative for chest pain and leg swelling.  Neurological: Negative for dizziness, tingling, tremors, sensory change, focal weakness and headaches.    The problem list and medications were reviewed and updated by myself where necessary and exist elsewhere in the encounter.   OBJECTIVE:  BP 130/82 (BP Location: Right Arm, Patient Position: Sitting, Cuff Size: Large)   Pulse 76   Temp 98.1 F (36.7 C) (Oral)   Resp 18   Ht _0  (1.727 m)   Wt 240 lb (108.9 kg)   SpO2 96%   BMI 36.49 kg/m   Physical Exam  Constitutional: He is oriented to  person, place, and time. He appears well-developed and well-nourished. No distress.  Cardiovascular: Normal rate and regular rhythm.   Pulmonary/Chest: Effort normal and breath sounds normal.  Musculoskeletal: Normal range of motion. He exhibits tenderness (low back paraspinal.). He exhibits no edema.  Neurological: He is alert and oriented to person, place, and time. No cranial nerve deficit.  Skin: Skin is warm and dry. He is not diaphoretic.  Psychiatric: He has a normal mood and affect.    Lab Results  Component Value Date   TSH 0.528 02/22/2013   Lab Results  Component Value Date   WBC 7.2 04/27/2015   HGB 15.7 04/27/2015   HCT 47.2 04/27/2015   MCV 81.4 04/27/2015    Lab Results  Component Value Date   CREATININE 1.27 11/19/2015   BUN 14 11/19/2015   NA 140 11/19/2015   K 3.9 11/19/2015   CL 105 11/19/2015   CO2 27 11/19/2015     No results found for this or any previous visit (from the past 72 hour(s)).  No results found.  ASSESSMENT AND PLAN  Larry Dawson was seen today for medication refill and back pain.  Diagnoses and all orders for this visit:  Essential hypertension: He refused labs today.  Everything appears stable.  Will hold on labs.  -     TSH -     CMP14+EGFR -     POCT CBC -  atenolol-chlorthalidone (TENORETIC) 50-25 MG tablet; Take 1 tablet by mouth daily. Please return in August 2018 for physical.   Acute midline low back pain without sciatica: Comments: Acute on chronic.  No red flags.  Does take some prevacid for occasional GERD and he will watch this and will call if gerd worse with meloxicam.              -     meloxicam (MOBIC) 15 MG tablet; Take 0.5-1 tablets (7.5-15 mg total) by mouth            daily. Take with food. -     cyclobenzaprine (FLEXERIL) 10 MG tablet; Take 0.5-1 tablets (5-10 mg total) by mouth 3 (three) times daily as needed for muscle spasms.   The patient is advised to call or return to clinic if he does not see an  improvement in symptoms, or to seek the care of the closest emergency department if he worsens with the above plan.   Philis Fendt, MHS, PA-C [facility] 03/24/2016 12:19 PM

## 2016-03-24 NOTE — Patient Instructions (Signed)
     IF you received an x-ray today, you will receive an invoice from [facility]. Please contact [facility] at [phone] with questions or concerns regarding your invoice.   IF you received labwork today, you will receive an invoice from [facility]. Please contact [facility] at [phone] with questions or concerns regarding your invoice.   Our billing staff will not be able to assist you with questions regarding bills from these companies.  You will be contacted with the lab results as soon as they are available. The fastest way to get your results is to activate your My Chart account. Instructions are located on the last page of this paperwork. If you have not heard from us regarding the results in 2 weeks, please contact this office.     

## 2016-03-27 NOTE — Telephone Encounter (Signed)
Seen and refilled 03/24/16

## 2016-10-28 ENCOUNTER — Ambulatory Visit (INDEPENDENT_AMBULATORY_CARE_PROVIDER_SITE_OTHER): Payer: Self-pay

## 2016-10-28 ENCOUNTER — Encounter (HOSPITAL_COMMUNITY): Payer: Self-pay | Admitting: Emergency Medicine

## 2016-10-28 ENCOUNTER — Ambulatory Visit (HOSPITAL_COMMUNITY)
Admission: EM | Admit: 2016-10-28 | Discharge: 2016-10-28 | Disposition: A | Discharge status: Home / Self Care | Payer: Self-pay | Attending: Family Medicine | Admitting: Family Medicine

## 2016-10-28 DIAGNOSIS — M25552 Pain in left hip: Secondary | ICD-10-CM

## 2016-10-28 DIAGNOSIS — S76112A Strain of left quadriceps muscle, fascia and tendon, initial encounter: Secondary | ICD-10-CM

## 2016-10-28 DIAGNOSIS — R52 Pain, unspecified: Secondary | ICD-10-CM

## 2016-10-28 DIAGNOSIS — M25562 Pain in left knee: Secondary | ICD-10-CM

## 2016-10-28 MED ORDER — NAPROXEN 500 MG PO TABS: 500.0000 mg | ORAL_TABLET | Freq: Two times a day (BID) | ORAL | 0 refills | Status: DC

## 2016-10-28 MED ORDER — HYDROCODONE-ACETAMINOPHEN 5-325 MG PO TABS: 1.0000 | ORAL_TABLET | Freq: Four times a day (QID) | ORAL | 0 refills | Status: DC | PRN

## 2016-10-28 NOTE — ED Provider Notes (Addendum)
CSN: [id number]     Arrival date & time 10/28/16  1201 History   None    Chief Complaint  Patient presents with  . Leg Injury   (Consider location/radiation/quality/duration/timing/severity/associated sxs/prior Treatment) Patient was dancing yesterday and was squatting and fell straining his left knee.  He c/o left knee and thigh discomfort.   The history is provided by the patient and the spouse.  Knee Pain  Location:  Knee Time since incident:  1 day Injury: yes   Knee location:  L knee Pain details:    Quality:  Aching   Severity:  Moderate   Onset quality:  Sudden   Timing:  Constant Chronicity:  New   Past Medical History:  Diagnosis Date  . Allergy   . Anal fissure   . COPD (chronic obstructive pulmonary disease) (HCC)    Albuterol PRN.  Marland Kitchen Hydrocele   . Hypertension   . IBS (irritable bowel syndrome) 05/22/13   per chart note-pt denied   Past Surgical History:  Procedure Laterality Date  . HERNIA REPAIR  prior to 2013  . HYDROCELE EXCISION Bilateral 11/23/2013   Procedure: HYDROCELECTOMY ADULT;  Surgeon: Valetta Fuller, MD;  Location: [facility];  Service: Urology;  Laterality: Bilateral;  . VASECTOMY  prior to 2013   Family History  Problem Relation Age of Onset  . Diabetes Father   . Hypertension Father   . Diabetes Brother   . Hypertension Brother   . Diabetes Brother   . Diabetes Brother    Social History  Substance Use Topics  . Smoking status: Former Games developer  . Smokeless tobacco: Never Used  . Alcohol use No    Review of Systems  Constitutional: Negative.   HENT: Negative.   Eyes: Negative.   Respiratory: Negative.   Cardiovascular: Negative.   Gastrointestinal: Negative.   Endocrine: Negative.   Genitourinary: Negative.   Musculoskeletal: Positive for arthralgias.  Allergic/Immunologic: Negative.   Neurological: Negative.   Hematological: Negative.   Psychiatric/Behavioral: Negative.     Allergies  Ace inhibitors;  Penicillins; and Latex  Home Medications   Prior to Admission medications   Medication Sig Start Date End Date Taking? Authorizing Provider  cyclobenzaprine (FLEXERIL) 10 MG tablet Take 0.5-1 tablets (5-10 mg total) by mouth 3 (three) times daily as needed for muscle spasms. 03/24/16  Yes Ofilia Neas, PA-C  aspirin 81 MG tablet Take 81 mg by mouth daily.    [provider]  atenolol-chlorthalidone (TENORETIC) 50-25 MG tablet Take 1 tablet by mouth daily. Please return in August 2018 for physical. 03/24/16   Ofilia Neas, PA-C  calcium-vitamin D (OSCAL WITH D) 500-200 MG-UNIT per tablet Take 1 tablet by mouth.    [provider]  cetirizine (ZYRTEC) 10 MG tablet Take 1 tablet (10 mg total) by mouth daily. 01/08/16   Trena Platt D, PA  cholecalciferol (VITAMIN D) 1000 units tablet Take 1,000 Units by mouth daily.    [provider]  fluticasone (FLONASE) 50 MCG/ACT nasal spray Place 2 sprays into both nostrils daily. 01/08/16   Trena Platt D, PA  HYDROcodone-acetaminophen (NORCO/VICODIN) 5-325 MG tablet Take 1-2 tablets by mouth every 6 (six) hours as needed. 10/28/16   Deatra Canter, FNP  lansoprazole (PREVACID) 15 MG capsule Take 15 mg by mouth as needed.     [provider]  naproxen (NAPROSYN) 500 MG tablet Take 1 tablet (500 mg total) by mouth 2 (two) times daily with a meal. 10/28/16  Deatra Canterxford, William J, FNP  Omega-3 Fatty Acids (FISH OIL) 1000 MG CAPS Take by mouth.    [provider]  vitamin C (ASCORBIC ACID) 500 MG tablet Take 500 mg by mouth daily.    [provider]   Meds Ordered and Administered this Visit  Medications - No data to display  BP 133/78 (BP Location: Right Arm) Comment (BP Location): large cuff  Pulse 69   Temp 98.9 F (37.2 C) (Oral)   Resp 18   SpO2 96%  No data found.   Physical Exam  Constitutional: He appears well-developed and well-nourished.  HENT:  Head: Normocephalic and  atraumatic.  Eyes: Pupils are equal, round, and reactive to light. Conjunctivae and EOM are normal.  Neck: Normal range of motion. Neck supple.  Cardiovascular: Normal rate, regular rhythm and normal heart sounds.   Pulmonary/Chest: Effort normal and breath sounds normal.  Musculoskeletal: He exhibits tenderness.  TTP and swelling left knee  Nursing note and vitals reviewed.   Urgent Care Course     Procedures (including critical care time)  Labs Review Labs Reviewed - No data to display  Imaging Review Dg Knee Complete 4 Views Left  Result Date: 10/28/2016 CLINICAL DATA:  61 year old male with left hip and knee pain EXAM: LEFT KNEE - COMPLETE 4+ VIEW COMPARISON:  None. FINDINGS: No evidence of fracture, dislocation, or joint effusion. No evidence of inflammatory arthropathy or other focal bone abnormality. Enthesopathy at the superior patellar pole. Soft tissues are unremarkable. IMPRESSION: Negative. Electronically Signed   By: Malachy MoanHeath  McCullough M.D.   On: 10/28/2016 13:28   Dg Hip Unilat With Pelvis 2-3 Views Left  Result Date: 10/28/2016 CLINICAL DATA:  Left hip pain while dancing last night EXAM: DG HIP (WITH OR WITHOUT PELVIS) 2-3V LEFT COMPARISON:  None. FINDINGS: There is no evidence of hip fracture or dislocation. There is no evidence of arthropathy or other focal bone abnormality. IMPRESSION: Negative. Electronically Signed   By: Marlan Palauharles  Clark M.D.   On: 10/28/2016 13:23     Visual Acuity Review  Right Eye Distance:   Left Eye Distance:   Bilateral Distance:    Right Eye Near:   Left Eye Near:    Bilateral Near:         MDM   1. Acute pain of left knee   2. Pain   3. Left hip pain   4. Quadriceps muscle rupture, left, initial encounter    Left Knee Imobilizer Norco 5/325 one to two po q 6 hours prn pain #12 Naprosyn 500mg  one po bid x 10 days   Referral to Orthopedics if not better call in a week if not better.        Deatra CanterOxford, William J,  FNP 10/28/16 1434    Deatra Canterxford, William J, FNP 11/07/16 2014

## 2016-10-28 NOTE — ED Triage Notes (Signed)
Last night while dancing, felt and heard a pop and patient fell.  Patient touches lateral left thigh as painful area and medial aspect of left knee as painful as well.

## 2016-11-07 ENCOUNTER — Encounter (HOSPITAL_COMMUNITY): Payer: Self-pay | Admitting: Emergency Medicine

## 2016-11-07 ENCOUNTER — Ambulatory Visit (HOSPITAL_COMMUNITY)
Admission: EM | Admit: 2016-11-07 | Discharge: 2016-11-07 | Disposition: A | Discharge status: Home / Self Care | Payer: Self-pay | Attending: Family Medicine | Admitting: Family Medicine

## 2016-11-07 DIAGNOSIS — S76112A Strain of left quadriceps muscle, fascia and tendon, initial encounter: Secondary | ICD-10-CM

## 2016-11-07 DIAGNOSIS — M25562 Pain in left knee: Secondary | ICD-10-CM

## 2016-11-07 MED ORDER — HYDROCODONE-ACETAMINOPHEN 5-325 MG PO TABS: 2.0000 | ORAL_TABLET | ORAL | 0 refills | Status: DC | PRN

## 2016-11-07 NOTE — ED Triage Notes (Signed)
PT reports his knee gave out and he fell 6/21. PT was seen here 6/22 and given knee immobilizer. PT reports Monday he started to experience increased bruising and swelling. PT has no history of blood clot.

## 2016-11-07 NOTE — ED Provider Notes (Addendum)
CSN: [id number]     Arrival date & time 11/07/16  1904 History   None    Chief Complaint  Patient presents with  . Leg Swelling   (Consider location/radiation/quality/duration/timing/severity/associated sxs/prior Treatment) Patient was dancing and fell down straining his left knee and hurting left leg.  He has bruising on his left thigh and swelling in his left knee and he is not getting better.  He was initially referred to Orthopedics but didn't follow up.   The history is provided by the patient and the spouse.  Knee Pain  Location:  Knee Injury: yes   Knee location:  L knee Pain details:    Quality:  Aching   Severity:  Moderate   Onset quality:  Sudden   Duration:  1 week   Timing:  Constant Chronicity:  New Dislocation: no   Foreign body present:  No foreign bodies Tetanus status:  Unknown   Past Medical History:  Diagnosis Date  . Allergy   . Anal fissure   . COPD (chronic obstructive pulmonary disease) (HCC)    Albuterol PRN.  Marland Kitchen. Hydrocele   . Hypertension   . IBS (irritable bowel syndrome) 05/22/13   per chart note-pt denied   Past Surgical History:  Procedure Laterality Date  . HERNIA REPAIR  prior to 2013  . HYDROCELE EXCISION Bilateral 11/23/2013   Procedure: HYDROCELECTOMY ADULT;  Surgeon: Valetta Fulleravid S Grapey, MD;  [facility];  Service: Urology;  Laterality: Bilateral;  . VASECTOMY  prior to 2013   Family History  Problem Relation Age of Onset  . Diabetes Father   . Hypertension Father   . Diabetes Brother   . Hypertension Brother   . Diabetes Brother   . Diabetes Brother    Social History  Substance Use Topics  . Smoking status: Former Games developermoker  . Smokeless tobacco: Never Used  . Alcohol use No    Review of Systems  Constitutional: Negative.   HENT: Negative.   Eyes: Negative.   Respiratory: Negative.   Cardiovascular: Negative.   Gastrointestinal: Negative.   Endocrine: Negative.   Genitourinary: Negative.    Musculoskeletal: Positive for arthralgias.  Skin: Negative.   Allergic/Immunologic: Negative.   Neurological: Negative.   Hematological: Negative.   Psychiatric/Behavioral: Negative.     Allergies  Ace inhibitors; Penicillins; and Latex  Home Medications   Prior to Admission medications   Medication Sig Start Date End Date Taking? Authorizing Provider  aspirin 81 MG tablet Take 81 mg by mouth daily.   Yes [provider]  atenolol-chlorthalidone (TENORETIC) 50-25 MG tablet Take 1 tablet by mouth daily. Please return in August 2018 for physical. 03/24/16  Yes Ofilia Neaslark, Michael L, PA-C  calcium-vitamin D (OSCAL WITH D) 500-200 MG-UNIT per tablet Take 1 tablet by mouth.   Yes [provider]  cetirizine (ZYRTEC) 10 MG tablet Take 1 tablet (10 mg total) by mouth daily. 01/08/16  Yes Trena PlattEnglish, Stephanie D, PA  cholecalciferol (VITAMIN D) 1000 units tablet Take 1,000 Units by mouth daily.   Yes [provider]  cyclobenzaprine (FLEXERIL) 10 MG tablet Take 0.5-1 tablets (5-10 mg total) by mouth 3 (three) times daily as needed for muscle spasms. 03/24/16  Yes Ofilia Neaslark, Michael L, PA-C  fluticasone (FLONASE) 50 MCG/ACT nasal spray Place 2 sprays into both nostrils daily. 01/08/16  Yes English, Judeth CornfieldStephanie D, PA  HYDROcodone-acetaminophen (NORCO/VICODIN) 5-325 MG tablet Take 1-2 tablets by mouth every 6 (six) hours as needed. 10/28/16  Yes Deatra Canterxford, Kelsy Polack J, FNP  lansoprazole (PREVACID) 15 MG capsule Take 15 mg by mouth as needed.    Yes [provider]  naproxen (NAPROSYN) 500 MG tablet Take 1 tablet (500 mg total) by mouth 2 (two) times daily with a meal. 10/28/16  Yes Dilan Fullenwider, Anselm PancoastWilliam J, FNP  Omega-3 Fatty Acids (FISH OIL) 1000 MG CAPS Take by mouth.   Yes [provider]  vitamin C (ASCORBIC ACID) 500 MG tablet Take 500 mg by mouth daily.   Yes [provider]  HYDROcodone-acetaminophen (NORCO/VICODIN) 5-325 MG tablet Take 2 tablets by mouth every 4  (four) hours as needed. 11/07/16   Deatra Canterxford, Janyce Ellinger J, FNP   Meds Ordered and Administered this Visit  Medications - No data to display  BP 137/75   Pulse 96   Temp 97.9 F (36.6 C) (Oral)   Resp 16   Ht 5\' 8"  (1.727 m)   Wt 235 lb (106.6 kg)   SpO2 96%   BMI 35.73 kg/m  No data found.   Physical Exam  Constitutional: He appears well-developed and well-nourished.  HENT:  Head: Normocephalic and atraumatic.  Eyes: Pupils are equal, round, and reactive to light. Conjunctivae and EOM are normal.  Neck: Normal range of motion. Neck supple.  Cardiovascular: Normal rate, regular rhythm and normal heart sounds.   Pulmonary/Chest: Effort normal and breath sounds normal.  Abdominal: Soft.  Musculoskeletal: He exhibits tenderness.  Left thigh bruising and tenderness.  Left knee swelling and tenderness.  Nursing note and vitals reviewed.   Urgent Care Course     Procedures (including critical care time)  Labs Review Labs Reviewed - No data to display  Imaging Review No results found.   Visual Acuity Review  Right Eye Distance:   Left Eye Distance:   Bilateral Distance:    Right Eye Near:   Left Eye Near:    Bilateral Near:         MDM   1. Acute pain of left knee   2. Quadriceps muscle strain, left, initial encounter    norco 5/325 one po qid prn #6  Orthopedic Referral  Advised patient to follow up with Orthopedics.    Deatra CanterOxford, Jaquavius Hudler J, FNP 11/07/16 2010    Deatra Canterxford, Wilkes Potvin J, FNP 11/07/16 2011

## 2016-12-24 ENCOUNTER — Other Ambulatory Visit: Payer: Self-pay | Admitting: Physician Assistant

## 2016-12-24 DIAGNOSIS — I10 Essential (primary) hypertension: Secondary | ICD-10-CM

## 2017-01-01 ENCOUNTER — Ambulatory Visit (INDEPENDENT_AMBULATORY_CARE_PROVIDER_SITE_OTHER): Payer: Self-pay | Admitting: Physician Assistant

## 2017-01-01 ENCOUNTER — Encounter: Payer: Self-pay | Admitting: Physician Assistant

## 2017-01-01 VITALS — BP 142/74 | HR 88 | Resp 16 | Ht 68.0 in | Wt 244.0 lb

## 2017-01-01 DIAGNOSIS — E119 Type 2 diabetes mellitus without complications: Secondary | ICD-10-CM

## 2017-01-01 DIAGNOSIS — S76109A Unspecified injury of unspecified quadriceps muscle, fascia and tendon, initial encounter: Secondary | ICD-10-CM

## 2017-01-01 DIAGNOSIS — I10 Essential (primary) hypertension: Secondary | ICD-10-CM

## 2017-01-01 LAB — POCT CBC
Granulocyte percent: 62.7 %G (ref 37–80)
HCT, POC: 43.6 % (ref 43.5–53.7)
Hemoglobin: 14.7 g/dL (ref 14.1–18.1)
LYMPH, POC: 2.1 (ref 0.6–3.4)
MCH, POC: 28.1 pg (ref 27–31.2)
MCHC: 33.8 g/dL (ref 31.8–35.4)
MCV: 83.3 fL (ref 80–97)
MID (cbc): 0.3 (ref 0–0.9)
MPV: 7.5 fL (ref 0–99.8)
PLATELET COUNT, POC: 287 10*3/uL (ref 142–424)
POC Granulocyte: 4.1 (ref 2–6.9)
POC LYMPH %: 32 % (ref 10–50)
POC MID %: 5.3 %M (ref 0–12)
RBC: 5.24 M/uL (ref 4.69–6.13)
RDW, POC: 13.5 %
WBC: 6.5 10*3/uL (ref 4.6–10.2)

## 2017-01-01 LAB — POCT GLYCOSYLATED HEMOGLOBIN (HGB A1C): Hemoglobin A1C: 6.9

## 2017-01-01 MED ORDER — ATENOLOL-CHLORTHALIDONE 50-25 MG PO TABS: 1.0000 | ORAL_TABLET | Freq: Every day | ORAL | 3 refills | Status: DC

## 2017-01-01 NOTE — Patient Instructions (Signed)
     IF you received an x-ray today, you will receive an invoice from [facility]. Please contact [facility] at [phone] with questions or concerns regarding your invoice.   IF you received labwork today, you will receive an invoice from [facility]. Please contact [facility] at [phone] with questions or concerns regarding your invoice.   Our billing staff will not be able to assist you with questions regarding bills from these companies.  You will be contacted with the lab results as soon as they are available. The fastest way to get your results is to activate your My Chart account. Instructions are located on the last page of this paperwork. If you have not heard from us regarding the results in 2 weeks, please contact this office.     

## 2017-01-01 NOTE — Progress Notes (Signed)
01/01/2017 3:58 PM   DOB: Oct 23, 1955 / MRN: [id number]  SUBJECTIVE:  Larry Dawson is a 61 y.o. male presenting for blood pressure medication refill.  Historically well controlled on Tenoretic.  Self pay patient. Last EKG in 2015 NSR. Quit smoking 15 years ago with a 15 pack year history.   He is allergic to ace inhibitors; penicillins; and latex.   He  has a past medical history of Allergy; Anal fissure; COPD (chronic obstructive pulmonary disease) (HCC); Hydrocele; Hypertension; and IBS (irritable bowel syndrome) (05/22/13).    He  reports that he has quit smoking. He has never used smokeless tobacco. He reports that he does not drink alcohol or use drugs. He  reports that he does not engage in sexual activity. The patient  has a past surgical history that includes Vasectomy (prior to 2013); Hernia repair (prior to 2013); and Hydrocele surgery (Bilateral, 11/23/2013).  His family history includes Diabetes in his brother, brother, brother, and father; Hypertension in his brother and father.  Review of Systems  Constitutional: Negative for chills, diaphoresis and fever.  Eyes: Negative.   Respiratory: Negative for cough, hemoptysis, sputum production, shortness of breath and wheezing.   Cardiovascular: Negative for chest pain, orthopnea and leg swelling.  Gastrointestinal: Negative for nausea.  Skin: Negative for rash.  Neurological: Negative for dizziness, sensory change, speech change, focal weakness and headaches.    The problem list and medications were reviewed and updated by myself where necessary and exist elsewhere in the encounter.   OBJECTIVE:  BP (!) 142/74 (BP Location: Left Arm, Patient Position: Sitting, Cuff Size: Large)   Pulse 88   Resp 16   Ht  (1.727 m)   Wt 244 lb (110.7 kg)   SpO2 97%   BMI 37.10 kg/m   Physical Exam  Constitutional: He appears well-developed. He is active and cooperative.  Non-toxic appearance.  Cardiovascular: Normal rate,  regular rhythm, S1 normal, S2 normal, normal heart sounds, intact distal pulses and normal pulses.  Exam reveals no gallop and no friction rub.   No murmur heard. Pulmonary/Chest: Effort normal. No stridor. No tachypnea. No respiratory distress. He has no wheezes. He has no rales.  Abdominal: He exhibits no distension.  Musculoskeletal: He exhibits tenderness (soft tissue about the left quadraceps). He exhibits no edema.       Left knee: He exhibits no swelling, no erythema and normal patellar mobility. Tenderness found.       Legs: Neurological: He is alert.  Skin: Skin is warm and dry. He is not diaphoretic. No pallor.  Vitals reviewed.  Results for orders placed or performed in visit on 01/01/17 (from the past 72 hour(s))  POCT CBC     Status: None   Collection Time: 01/01/17  3:31 PM  Result Value Ref Range   WBC 6.5 4.6 - 10.2 K/uL   Lymph, poc 2.1 0.6 - 3.4   POC LYMPH PERCENT 32.0 10 - 50 %L   MID (cbc) 0.3 0 - 0.9   POC MID % 5.3 0 - 12 %M   POC Granulocyte 4.1 2 - 6.9   Granulocyte percent 62.7 37 - 80 %G   RBC 5.24 4.69 - 6.13 M/uL   Hemoglobin 14.7 14.1 - 18.1 g/dL   HCT, POC 40.9 81.1 - 53.7 %   MCV 83.3 80 - 97 fL   MCH, POC 28.1 27 - 31.2 pg   MCHC 33.8 31.8 - 35.4 g/dL   RDW, POC 91.4 %  Platelet Count, POC 287 142 - 424 K/uL   MPV 7.5 0 - 99.8 fL  POCT A1C     Status: None   Collection Time: 01/01/17  3:33 PM  Result Value Ref Range   Hemoglobin A1C 6.9    No results found.  ASSESSMENT AND PLAN:  Larry Dawson was seen today for medication refill.  Diagnoses and all orders for this visit:  Essential hypertension -     POCT A1C -     POCT CBC -     Basic metabolic panel -     atenolol-chlorthalidone (TENORETIC) 50-25 MG tablet; Take 1 tablet by mouth daily. -     Lipid Panel  Injury of quadriceps muscle -     Ambulatory referral to Orthopedic Surgery  Type 2 diabetes mellitus without complication, without long-term current use of insulin  (HCC) Comments: New. Advised weight loss at about 15-20 lbs via eating less surgar and simple carbohydrate. RTC in three months.      The patient is advised to call or return to clinic if he does not see an improvement in symptoms, or to seek the care of the closest emergency department if he worsens with the above plan.   Deliah Boston, MHS, PA-C [facility] 01/01/2017 3:58 PM

## 2017-01-02 LAB — BASIC METABOLIC PANEL
BUN / CREAT RATIO: 9 — AB (ref 10–24)
BUN: 11 mg/dL (ref 8–27)
CO2: 25 mmol/L (ref 20–29)
CREATININE: 1.21 mg/dL (ref 0.76–1.27)
Calcium: 8.9 mg/dL (ref 8.6–10.2)
Chloride: 99 mmol/L (ref 96–106)
GFR, EST AFRICAN AMERICAN: 75 mL/min/{1.73_m2} (ref >59)
GFR, EST NON AFRICAN AMERICAN: 65 mL/min/{1.73_m2} (ref >59)
GLUCOSE: 216 mg/dL — AB (ref 65–99)
Potassium: 3.6 mmol/L (ref 3.5–5.2)
Sodium: 139 mmol/L (ref 134–144)

## 2017-01-02 LAB — LIPID PANEL
CHOL/HDL RATIO: 4.2 ratio (ref 0.0–5.0)
CHOLESTEROL TOTAL: 194 mg/dL (ref 100–199)
HDL: 46 mg/dL (ref >39)
LDL CALC: 124 mg/dL — AB (ref 0–99)
Triglycerides: 118 mg/dL (ref 0–149)
VLDL CHOLESTEROL CAL: 24 mg/dL (ref 5–40)

## 2017-01-27 ENCOUNTER — Other Ambulatory Visit: Payer: Self-pay | Admitting: Physician Assistant

## 2017-01-27 DIAGNOSIS — I10 Essential (primary) hypertension: Secondary | ICD-10-CM

## 2017-05-10 ENCOUNTER — Ambulatory Visit (HOSPITAL_COMMUNITY)
Admission: EM | Admit: 2017-05-10 | Discharge: 2017-05-10 | Disposition: A | Discharge status: Home / Self Care | Payer: Self-pay | Attending: Internal Medicine | Admitting: Internal Medicine

## 2017-05-10 ENCOUNTER — Encounter (HOSPITAL_COMMUNITY): Payer: Self-pay | Admitting: Emergency Medicine

## 2017-05-10 DIAGNOSIS — J019 Acute sinusitis, unspecified: Secondary | ICD-10-CM

## 2017-05-10 MED ORDER — CETIRIZINE HCL 10 MG PO TABS: 10.0000 mg | ORAL_TABLET | Freq: Every day | ORAL | 0 refills | Status: AC

## 2017-05-10 MED ORDER — DOXYCYCLINE HYCLATE 100 MG PO CAPS: 100.0000 mg | ORAL_CAPSULE | Freq: Two times a day (BID) | ORAL | 0 refills | Status: AC

## 2017-05-10 NOTE — ED Provider Notes (Signed)
[facility]    CSN: [id number] Arrival date & time: 05/10/17  1456     History   Chief Complaint Chief Complaint  Patient presents with  . URI    HPI Larry Dawson is a 62 y.o. male.   Larry Dawson presents with complaints of congestion, nasal drainage, slight cough due to post nasal drip, which started approximately 1/27. States his wife heard him wheezing last night while sleeping. Denies chest pain, body aches, known fevers. Has a slight headache. Uses daily nasal spray for allergies. Has been taking theraflu and over the counter cough medication which has been helping. Dry throat. Without ear pain. Has had multiple ill contacts. History of COPD, hypertension. Without rash.   ROS per HPI.       Past Medical History:  Diagnosis Date  . Allergy   . Anal fissure   . COPD (chronic obstructive pulmonary disease) (HCC)    Albuterol PRN.  Marland Kitchen. Hydrocele   . Hypertension   . IBS (irritable bowel syndrome) 05/22/13   per chart note-pt denied    Patient Active Problem List   Diagnosis Date Noted  . Hyperglycemia 07/02/2013  . COPD (chronic obstructive pulmonary disease) (HCC) 11/30/2012  . HTN (hypertension) 07/18/2011    Past Surgical History:  Procedure Laterality Date  . HERNIA REPAIR  prior to 2013  . HYDROCELE EXCISION Bilateral 11/23/2013   Procedure: HYDROCELECTOMY ADULT;  Surgeon: Valetta Fulleravid S Grapey, MD;  [facility];  Service: Urology;  Laterality: Bilateral;  . VASECTOMY  prior to 2013       Home Medications    Prior to Admission medications   Medication Sig Start Date End Date Taking? Authorizing Provider  aspirin 81 MG tablet Take 81 mg by mouth daily.   Yes [provider]  atenolol-chlorthalidone (TENORETIC) 50-25 MG tablet Take 1 tablet by mouth daily. 01/01/17  Yes Ofilia Neaslark, Michael L, PA-C  cholecalciferol (VITAMIN D) 1000 units tablet Take 1,000 Units by mouth daily.   Yes [provider]  lansoprazole  (PREVACID) 15 MG capsule Take 15 mg by mouth as needed.    Yes [provider]  Omega-3 Fatty Acids (FISH OIL) 1000 MG CAPS Take by mouth.   Yes [provider]  cetirizine (ZYRTEC) 10 MG tablet Take 1 tablet (10 mg total) by mouth daily. 05/10/17   Georgetta HaberBurky, Saharah Sherrow B, NP  doxycycline (VIBRAMYCIN) 100 MG capsule Take 1 capsule (100 mg total) by mouth 2 (two) times daily for 7 days. 05/10/17 05/17/17  Georgetta HaberBurky, Orlean Holtrop B, NP    Family History Family History  Problem Relation Age of Onset  . Diabetes Father   . Hypertension Father   . Diabetes Brother   . Hypertension Brother   . Diabetes Brother   . Diabetes Brother     Social History Social History   Tobacco Use  . Smoking status: Former Games developermoker  . Smokeless tobacco: Never Used  Substance Use Topics  . Alcohol use: No  . Drug use: No     Allergies   Ace inhibitors; Penicillins; and Latex   Review of Systems Review of Systems   Physical Exam Triage Vital Signs ED Triage Vitals [05/10/17 1524]  Enc Vitals Group     BP (!) 129/56     Pulse Rate 68     Resp 20     Temp 98.3 F (36.8 C)     Temp Source Oral     SpO2 98 %  Weight      Height      Head Circumference      Peak Flow      Pain Score      Pain Loc      Pain Edu?      Excl. in GC?    No data found.  Updated Vital Signs BP (!) 129/56 (BP Location: Left Arm)   Pulse 68   Temp 98.3 F (36.8 C) (Oral)   Resp 20   SpO2 98%   Visual Acuity Right Eye Distance:   Left Eye Distance:   Bilateral Distance:    Right Eye Near:   Left Eye Near:    Bilateral Near:     Physical Exam  Constitutional: He is oriented to person, place, and time. He appears well-developed and well-nourished.  HENT:  Head: Normocephalic and atraumatic.  Right Ear: Tympanic membrane, external ear and ear canal normal.  Left Ear: Tympanic membrane, external ear and ear canal normal.  Nose: Rhinorrhea present. Right sinus exhibits frontal sinus tenderness. Right  sinus exhibits no maxillary sinus tenderness. Left sinus exhibits frontal sinus tenderness. Left sinus exhibits no maxillary sinus tenderness.  Mouth/Throat: Uvula is midline, oropharynx is clear and moist and mucous membranes are normal.  Eyes: Conjunctivae are normal. Pupils are equal, round, and reactive to light.  Neck: Normal range of motion.  Cardiovascular: Normal rate and regular rhythm.  Pulmonary/Chest: Effort normal and breath sounds normal.  Lymphadenopathy:    He has no cervical adenopathy.  Neurological: He is alert and oriented to person, place, and time.  Skin: Skin is warm and dry.  Vitals reviewed.    UC Treatments / Results  Labs (all labs ordered are listed, but only abnormal results are displayed) Labs Reviewed - No data to display  EKG  EKG Interpretation None       Radiology No results found.  Procedures Procedures (including critical care time)  Medications Ordered in UC Medications - No data to display   Initial Impression / Assessment and Plan / UC Course  I have reviewed the triage vital signs and the nursing notes.  Pertinent labs & imaging results that were available during my care of the patient were reviewed by me and considered in my medical decision making (see chart for details).     Push fluids to ensure adequate hydration and keep secretions thin.  Tylenol and/or ibuprofen as needed for pain or fevers.  Complete course of antibiotics. Continue with daily nasal spray. Zyrtec daily. If symptoms worsen or do not improve in the next week to return to be seen or to follow up with PCP.  Patient verbalized understanding and agreeable to plan.    Final Clinical Impressions(s) / UC Diagnoses   Final diagnoses:  Acute sinusitis, recurrence not specified, unspecified location    ED Discharge Orders        Ordered    doxycycline (VIBRAMYCIN) 100 MG capsule  2 times daily     05/10/17 1649    cetirizine (ZYRTEC) 10 MG tablet  Daily      05/10/17 1649       Controlled Substance Prescriptions [place] Controlled Substance Registry consulted? Not Applicable   Georgetta Haber, NP 05/10/17 [phone]

## 2017-05-10 NOTE — ED Triage Notes (Signed)
PT C/O: cold sx onset 5 days associated w/wheezing, cough, chest congestion   DENIES: fevers  TAKING MEDS: OTC cough meds, Theraflu   A&O x4... NAD... Ambulatory

## 2017-05-10 NOTE — Discharge Instructions (Signed)
Push fluids to ensure adequate hydration and keep secretions thin.  Tylenol and/or ibuprofen as needed for pain or fevers.  Continue with over the counter treatments as needed for symptoms. Complete course of antibiotics. If symptoms worsen or do not improve in the next week to return to be seen or to follow up with your PCP.

## 2018-01-05 ENCOUNTER — Ambulatory Visit (HOSPITAL_COMMUNITY)
Admission: EM | Admit: 2018-01-05 | Discharge: 2018-01-05 | Disposition: A | Discharge status: Home / Self Care | Payer: Self-pay | Attending: Family Medicine | Admitting: Family Medicine

## 2018-01-05 ENCOUNTER — Other Ambulatory Visit: Payer: Self-pay

## 2018-01-05 ENCOUNTER — Encounter (HOSPITAL_COMMUNITY): Payer: Self-pay

## 2018-01-05 DIAGNOSIS — L089 Local infection of the skin and subcutaneous tissue, unspecified: Secondary | ICD-10-CM

## 2018-01-05 MED ORDER — MUPIROCIN 2 % EX OINT: 1.0000 "application " | TOPICAL_OINTMENT | Freq: Three times a day (TID) | CUTANEOUS | 0 refills | Status: DC

## 2018-01-05 MED ORDER — DOXYCYCLINE HYCLATE 100 MG PO CAPS: 100.0000 mg | ORAL_CAPSULE | Freq: Two times a day (BID) | ORAL | 0 refills | Status: AC

## 2018-01-05 NOTE — ED Triage Notes (Signed)
Pt states she has a toe nail problem he thinks he cut it back to far. ( right foot big toe )

## 2018-01-05 NOTE — Discharge Instructions (Signed)
Toe infection I have prescribed you doxycycline 100 mg twice daily x7 days. I have also prescribed the mupirocin ointment which I would like you to apply 3 times a day to the affected area x7 to 10 days.  I recommend at least soaking her toe and Epson salt at least once or twice a day to facilitate cleaning and healing.  Change dressing daily and apply gauze and wrap toe loosely.  If wound does not resolve after antibiotics is completed please follow-up with your primary care provider.    Review of your medical chart I do see that you had an elevated hemoglobin A1c back in 2018 which may indicate Diabetes please schedule follow-up with your primary care provider to have this further evaluated.

## 2018-01-05 NOTE — ED Provider Notes (Signed)
[facility]    CSN: [id number] Arrival date & time: 01/05/18  1358     History   Chief Complaint Chief Complaint  Patient presents with  . Nail Problem    HPI Larry Dawson is a 62 y.o. male.   HPI  Presents today with a complaint of right great toe pain after trimming toe nails. No diagnosis of diabetes, although last A1C over 1 year ago was significant for type 2 diabetes with A1C 6.9.  He reports right great toe is increasingly painful, red and has been draining a purulent type drainage.  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 He denies diminished sensation in his foot, numbness and tingling, nausea, vomiting, fever or chills. Past Medical History:  Diagnosis Date  . Allergy   . Anal fissure   . COPD (chronic obstructive pulmonary disease) (HCC)    Albuterol PRN.  Marland Kitchen Hydrocele   . Hypertension   . IBS (irritable bowel syndrome) 05/22/13   per chart note-pt denied    Patient Active Problem List   Diagnosis Date Noted  . Hyperglycemia 07/02/2013  . COPD (chronic obstructive pulmonary disease) (HCC) 11/30/2012  . HTN (hypertension) 07/18/2011    Past Surgical History:  Procedure Laterality Date  . HERNIA REPAIR  prior to 2013  . HYDROCELE EXCISION Bilateral 11/23/2013   Procedure: HYDROCELECTOMY ADULT;  Surgeon: Valetta Fuller, MD;  Location: [facility];  Service: Urology;  Laterality: Bilateral;  . VASECTOMY  prior to 2013       Home Medications    Prior to Admission medications   Medication Sig Start Date End Date Taking? Authorizing Provider  aspirin 81 MG tablet Take 81 mg by mouth daily.    [provider]  atenolol-chlorthalidone (TENORETIC) 50-25 MG tablet Take 1 tablet by mouth daily. 01/01/17   Ofilia Neas, PA-C  cetirizine (ZYRTEC) 10 MG tablet Take 1 tablet (10 mg total) by mouth daily. 05/10/17   Georgetta Haber, NP  cholecalciferol (VITAMIN D) 1000 units tablet Take  1,000 Units by mouth daily.    [provider]  lansoprazole (PREVACID) 15 MG capsule Take 15 mg by mouth as needed.     [provider]  Omega-3 Fatty Acids (FISH OIL) 1000 MG CAPS Take by mouth.    [provider]    Family History Family History  Problem Relation Age of Onset  . Diabetes Father   . Hypertension Father   . Diabetes Brother   . Hypertension Brother   . Diabetes Brother   . Diabetes Brother     Social History Social History   Tobacco Use  . Smoking status: Former Games developer  . Smokeless tobacco: Never Used  Substance Use Topics  . Alcohol use: No  . Drug use: No     Allergies   Ace inhibitors; Penicillins; and Latex   Review of Systems Review of Systems Pertinent negatives listed in HPI Physical Exam Triage Vital Signs ED Triage Vitals  Enc Vitals Group     BP 01/05/18 1452 137/84     Pulse Rate 01/05/18 1452 62     Resp 01/05/18 1452 18     Temp 01/05/18 1452 98 F (36.7 C)     Temp Source 01/05/18 1452 Oral     SpO2 01/05/18 1452 100 %     Weight 01/05/18 1450 238 lb (108 kg)     Height --      Head Circumference --  Peak Flow --      Pain Score --      Pain Loc --      Pain Edu? --      Excl. in GC? --    No data found.  Updated Vital Signs BP 137/84 (BP Location: Right Arm)   Pulse 62   Temp 98 F (36.7 C) (Oral)   Resp 18   Wt 238 lb (108 kg)   SpO2 100%   BMI 36.19 kg/m   Visual Acuity Right Eye Distance:   Left Eye Distance:   Bilateral Distance:    Right Eye Near:   Left Eye Near:    Bilateral Near:     Physical Exam General appearance: alert, well developed, well nourished, cooperative and in no distress Head: Normocephalic, without obvious abnormality, atraumatic Respiratory: Respirations even and unlabored, normal respiratory rate Extremities: Right Great Toe: erythematous, tenderness with palpation, wound tip of nail bed. serosanguinous with purulent discharge present. Skin: Skin  color, texture, turgor normal. No rashes seen  Psych: Appropriate mood and affect. Neurologic: Mental status: Alert, oriented to person, place, and time, thought content appropriate. UC Treatments / Results  Labs (all labs ordered are listed, but only abnormal results are displayed) Labs Reviewed - No data to display  EKG None  Radiology No results found.  Procedures Procedures (including critical care time)  Medications Ordered in UC Medications - No data to display  Initial Impression / Assessment and Plan / UC Course  I have reviewed the triage vital signs and the nursing notes.  Pertinent labs & imaging results that were available during my care of the patient were reviewed by me and considered in my medical decision making (see chart for details).   Larry Dawson is a 62 year old male with a prior history of an elevated hemoglobin A1c significant for diabetes x1 year- who presents after sustaining an injury while cutting his toenail x1 week ago. Great toe was significant for infection given the presence of purulent drainage, erythema, edema and significant tenderness with palpation.  Patient has a penicillin allergy therefore we will treat him with doxycycline 100 mg twice daily with a local antibiotic-mupirocin ointment apply 3 times daily.  Patient advised to soak toe with Epson salt at least once to twice daily to facilitate cleaning and dry toe thoroughly to reduce moisture.  Prior to discharge patient's toe was cleansed and wrapped in gauze.  Patient advised to follow-up with primary care provider for complete physical further evaluation if infection worsens or does not heal. Patient agreed with plan and verbalized understanding.  Final Clinical Impressions(s) / UC Diagnoses   Final diagnoses:  Toe infection     Discharge Instructions     Toe infection I have prescribed you doxycycline 100 mg twice daily x7 days. I have also prescribed the mupirocin ointment which I would like  you to apply 3 times a day to the affected area x7 to 10 days.  I recommend at least soaking her toe and Epson salt at least once or twice a day to facilitate cleaning and healing.  Change dressing daily and apply gauze and wrap toe loosely.  If wound does not resolve after antibiotics is completed please follow-up with your primary care provider.    Review of your medical chart I do see that you had an elevated hemoglobin A1c back in 2018 which may indicate Diabetes please schedule follow-up with your primary care provider to have this further evaluated.     ED Prescriptions  Medication Sig Dispense Auth. Provider   doxycycline (VIBRAMYCIN) 100 MG capsule Take 1 capsule (100 mg total) by mouth 2 (two) times daily for 7 days. 14 capsule Bing Neighbors, FNP   mupirocin ointment (BACTROBAN) 2 % Apply 1 application topically 3 (three) times daily. 30 g Bing Neighbors, FNP     Controlled Substance Prescriptions [place] Controlled Substance Registry consulted? Not Applicable   Bing Neighbors, FNP 01/05/18 2031

## 2018-02-03 ENCOUNTER — Other Ambulatory Visit: Payer: Self-pay | Admitting: Physician Assistant

## 2018-02-03 DIAGNOSIS — I10 Essential (primary) hypertension: Secondary | ICD-10-CM

## 2018-02-04 ENCOUNTER — Ambulatory Visit: Payer: Self-pay | Admitting: Emergency Medicine

## 2018-02-04 ENCOUNTER — Encounter: Payer: Self-pay | Admitting: Emergency Medicine

## 2018-02-04 ENCOUNTER — Other Ambulatory Visit: Payer: Self-pay

## 2018-02-04 VITALS — BP 128/75 | HR 55 | Temp 97.8°F | Resp 16 | Ht 68.5 in | Wt 236.8 lb

## 2018-02-04 DIAGNOSIS — R7303 Prediabetes: Secondary | ICD-10-CM

## 2018-02-04 DIAGNOSIS — I10 Essential (primary) hypertension: Secondary | ICD-10-CM

## 2018-02-04 MED ORDER — ATENOLOL-CHLORTHALIDONE 50-25 MG PO TABS: 1.0000 | ORAL_TABLET | Freq: Every day | ORAL | 3 refills | Status: DC

## 2018-02-04 NOTE — Progress Notes (Signed)
BP Readings from Last 3 Encounters:  02/04/18 128/75  01/05/18 137/84  05/10/17 (!) 129/56   Wt Readings from Last 3 Encounters:  02/04/18 236 lb 12.8 oz (107.4 kg)  01/05/18 238 lb (108 kg)  01/01/17 244 lb (110.7 kg)   Larry Dawson 62 y.o.   Chief Complaint  Patient presents with  . Medication Refill    Atenolol-Chlorthalidone    HISTORY OF PRESENT ILLNESS: This is a 62 y.o. male with history of hypertension here for follow-up and medication refill.  Also has a history of hyperglycemia, prediabetes, needs blood work.  On no medication for diabetes.  Presently fasting. Walks regularly and lifts weights but has been hampered by left knee ACL injury. HPI   Prior to Admission medications   Medication Sig Start Date End Date Taking? Authorizing Provider  atenolol-chlorthalidone (TENORETIC) 50-25 MG tablet Take 1 tablet by mouth daily. 02/04/18 05/05/18 Yes Nazariah Cadet, Eilleen Kempf, MD  BLACK CURRANT SEED OIL PO Take by mouth daily.   Yes [provider]  cetirizine (ZYRTEC) 10 MG tablet Take 1 tablet (10 mg total) by mouth daily. 05/10/17  Yes Linus Mako B, NP  cholecalciferol (VITAMIN D) 1000 units tablet Take 1,000 Units by mouth daily.   Yes [provider]  lansoprazole (PREVACID) 15 MG capsule Take 15 mg by mouth as needed.    Yes [provider]  Omega-3 Fatty Acids (FISH OIL) 1000 MG CAPS Take by mouth.   Yes [provider]  aspirin 81 MG tablet Take 81 mg by mouth daily.    [provider]  mupirocin ointment (BACTROBAN) 2 % Apply 1 application topically 3 (three) times daily. Patient not taking: Reported on 02/04/2018 01/05/18   Bing Neighbors, FNP    Allergies  Allergen Reactions  . Ace Inhibitors Swelling    Suspected angioedema  . Penicillins Hives, Rash and Other (See Comments)    Throat swells  . Latex Hives, Swelling and Rash    Patient Active Problem List   Diagnosis Date Noted  . Hyperglycemia 07/02/2013   . COPD (chronic obstructive pulmonary disease) (HCC) 11/30/2012  . HTN (hypertension) 07/18/2011    Past Medical History:  Diagnosis Date  . Allergy   . Anal fissure   . COPD (chronic obstructive pulmonary disease) (HCC)    Albuterol PRN.  Marland Kitchen Hydrocele   . Hypertension   . IBS (irritable bowel syndrome) 05/22/13   per chart note-pt denied    Past Surgical History:  Procedure Laterality Date  . HERNIA REPAIR  prior to 2013  . HYDROCELE EXCISION Bilateral 11/23/2013   Procedure: HYDROCELECTOMY ADULT;  Surgeon: Valetta Fuller, MD;  Location: [facility];  Service: Urology;  Laterality: Bilateral;  . VASECTOMY  prior to 2013    Social History   Socioeconomic History  . Marital status: Married    Spouse name: Not on file  . Number of children: Not on file  . Years of education: Not on file  . Highest education level: Not on file  Occupational History  . Not on file  Social Needs  . Financial resource strain: Not on file  . Food insecurity:    Worry: Not on file    Inability: Not on file  . Transportation needs:    Medical: Not on file    Non-medical: Not on file  Tobacco Use  . Smoking status: Former Games developer  . Smokeless tobacco: Never Used  Substance and Sexual Activity  . Alcohol use:  No  . Drug use: No  . Sexual activity: Never  Lifestyle  . Physical activity:    Days per week: Not on file    Minutes per session: Not on file  . Stress: Not on file  Relationships  . Social connections:    Talks on phone: Not on file    Gets together: Not on file    Attends religious service: Not on file    Active member of club or organization: Not on file    Attends meetings of clubs or organizations: Not on file    Relationship status: Not on file  . Intimate partner violence:    Fear of current or ex partner: Not on file    Emotionally abused: Not on file    Physically abused: Not on file    Forced sexual activity: Not on file  Other Topics Concern  .  Not on file  Social History Narrative   Marital status: married      Employment: [employer]; drives all over.  Owns company.      Tobacco:  Quit smoking in 2005; smoked 25 years      Alcohol:  Rare beer      Drugs:  None      Exercise:  None   Daily caffeine     Family History  Problem Relation Age of Onset  . Diabetes Father   . Hypertension Father   . Diabetes Brother   . Hypertension Brother   . Diabetes Brother   . Diabetes Brother      Review of Systems  Constitutional: Negative.  Negative for chills and fever.  HENT: Negative.   Eyes: Negative.   Respiratory: Negative.  Negative for cough and shortness of breath.   Cardiovascular: Negative.  Negative for chest pain and palpitations.  Gastrointestinal: Negative.  Negative for abdominal pain, diarrhea, nausea and vomiting.  Genitourinary: Negative.  Negative for dysuria and hematuria.  Musculoskeletal: Negative.  Negative for back pain, myalgias and neck pain.  Skin: Negative.  Negative for rash.  Neurological: Negative.  Negative for dizziness and headaches.  Endo/Heme/Allergies: Negative.   All other systems reviewed and are negative.  Vitals:   02/04/18 1118  BP: 128/75  Pulse: (!) 55  Resp: 16  Temp: 97.8 F (36.6 C)  SpO2: 98%     Physical Exam  Constitutional: He is oriented to person, place, and time. He appears well-developed and well-nourished.  HENT:  Head: Normocephalic and atraumatic.  Nose: Nose normal.  Mouth/Throat: Oropharynx is clear and moist.  Eyes: Pupils are equal, round, and reactive to light. Conjunctivae are normal.  Neck: Normal range of motion. Neck supple.  Cardiovascular: Normal rate, regular rhythm and normal heart sounds.  Pulmonary/Chest: Effort normal and breath sounds normal.  Abdominal: Soft. Bowel sounds are normal. He exhibits no distension. There is no tenderness.  Musculoskeletal: Normal range of motion.  Neurological: He is alert and oriented to person,  place, and time. No sensory deficit. He exhibits normal muscle tone. Coordination normal.  Skin: Skin is warm and dry. Capillary refill takes less than 2 seconds.  Psychiatric: He has a normal mood and affect. His behavior is normal.  Vitals reviewed.  A total of 25 minutes was spent in the room with the patient, greater than 50% of which was in counseling/coordination of care regarding chronic medical problems, management, treatment, medications, need for blood work, and need for follow-up.   ASSESSMENT & PLAN: Amy was seen today for medication refill.  Diagnoses and all orders for this visit:  Essential hypertension -     atenolol-chlorthalidone (TENORETIC) 50-25 MG tablet; Take 1 tablet by mouth daily. -     Comprehensive metabolic panel -     Hemoglobin A1c -     Lipid panel  Prediabetes -     Comprehensive metabolic panel -     Hemoglobin A1c -     Lipid panel    Patient Instructions       If you have lab work done today you will be contacted with your lab results within the next 2 weeks.  If you have not heard from [place] then please contact us. The fastest way to get your results is to register for My Chart.   IF you received an x-ray today, you will receive an invoice from [facility]. Please contact [facility] at [phone] with questions or concerns regarding your invoice.   IF you received labwork today, you will receive an invoice from [facility]. Please contact [facility] at [phone] with questions or concerns regarding your invoice.   Our billing staff will not be able to assist you with questions regarding bills from these companies.  You will be contacted with the lab results as soon as they are available. The fastest way to get your results is to activate your My Chart account. Instructions are located on the last page of this paperwork. If you have not heard from [place] regarding the results in 2 weeks, please contact this office.      Hypertension Hypertension, commonly called high blood pressure, is when the force of blood pumping through the arteries is too strong. The arteries are the blood vessels that carry blood from the heart throughout the body. Hypertension forces the heart to work harder to pump blood and may cause arteries to become narrow or stiff. Having untreated or uncontrolled hypertension can cause heart attacks, strokes, kidney disease, and other problems. A blood pressure reading consists of a higher number over a lower number. Ideally, your blood pressure should be below 120/80. The first ("top") number is called the systolic pressure. It is a measure of the pressure in your arteries as your heart beats. The second ("bottom") number is called the diastolic pressure. It is a measure of the pressure in your arteries as the heart relaxes. What are the causes? The cause of this condition is not known. What increases the risk? Some risk factors for high blood pressure are under your control. Others are not. Factors you can change  Smoking.  Having type 2 diabetes mellitus, high cholesterol, or both.  Not getting enough exercise or physical activity.  Being overweight.  Having too much fat, sugar, calories, or salt (sodium) in your diet.  Drinking too much alcohol. Factors that are difficult or impossible to change  Having chronic kidney disease.  Having a family history of high blood pressure.  Age. Risk increases with age.  Race. You may be at higher risk if you are African-American.  Gender. Men are at higher risk than women before age 84. After age 65, women are at higher risk than men.  Having obstructive sleep apnea.  Stress. What are the signs or symptoms? Extremely high blood pressure (hypertensive crisis) may cause:  Headache.  Anxiety.  Shortness of breath.  Nosebleed.  Nausea and vomiting.  Severe chest pain.  Jerky movements you cannot control (seizures).  How is  this diagnosed? This condition is diagnosed by measuring your blood pressure while you are  seated, with your arm resting on a surface. The cuff of the blood pressure monitor will be placed directly against the skin of your upper arm at the level of your heart. It should be measured at least twice using the same arm. Certain conditions can cause a difference in blood pressure between your right and left arms. Certain factors can cause blood pressure readings to be lower or higher than normal (elevated) for a short period of time:  When your blood pressure is higher when you are in a health care provider's office than when you are at home, this is called white coat hypertension. Most people with this condition do not need medicines.  When your blood pressure is higher at home than when you are in a health care provider's office, this is called masked hypertension. Most people with this condition may need medicines to control blood pressure.  If you have a high blood pressure reading during one visit or you have normal blood pressure with other risk factors:  You may be asked to return on a different day to have your blood pressure checked again.  You may be asked to monitor your blood pressure at home for 1 week or longer.  If you are diagnosed with hypertension, you may have other blood or imaging tests to help your health care provider understand your overall risk for other conditions. How is this treated? This condition is treated by making healthy lifestyle changes, such as eating healthy foods, exercising more, and reducing your alcohol intake. Your health care provider may prescribe medicine if lifestyle changes are not enough to get your blood pressure under control, and if:  Your systolic blood pressure is above 130.  Your diastolic blood pressure is above 80.  Your personal target blood pressure may vary depending on your medical conditions, your age, and other factors. Follow these  instructions at home: Eating and drinking  Eat a diet that is high in fiber and potassium, and low in sodium, added sugar, and fat. An example eating plan is called the DASH (Dietary Approaches to Stop Hypertension) diet. To eat this way: ? Eat plenty of fresh fruits and vegetables. Try to fill half of your plate at each meal with fruits and vegetables. ? Eat whole grains, such as whole wheat pasta, brown rice, or whole grain bread. Fill about one quarter of your plate with whole grains. ? Eat or drink low-fat dairy products, such as skim milk or low-fat yogurt. ? Avoid fatty cuts of meat, processed or cured meats, and poultry with skin. Fill about one quarter of your plate with lean proteins, such as fish, chicken without skin, beans, eggs, and tofu. ? Avoid premade and processed foods. These tend to be higher in sodium, added sugar, and fat.  Reduce your daily sodium intake. Most people with hypertension should eat less than 1,500 mg of sodium a day.  Limit alcohol intake to no more than 1 drink a day for nonpregnant women and 2 drinks a day for men. One drink equals 12 oz of beer, 5 oz of wine, or 1 oz of hard liquor. Lifestyle  Work with your health care provider to maintain a healthy body weight or to lose weight. Ask what an ideal weight is for you.  Get at least 30 minutes of exercise that causes your heart to beat faster (aerobic exercise) most days of the week. Activities may include walking, swimming, or biking.  Include exercise to strengthen your muscles (resistance exercise), such  as pilates or lifting weights, as part of your weekly exercise routine. Try to do these types of exercises for 30 minutes at least 3 days a week.  Do not use any products that contain nicotine or tobacco, such as cigarettes and e-cigarettes. If you need help quitting, ask your health care provider.  Monitor your blood pressure at home as told by your health care provider.  Keep all follow-up visits as  told by your health care provider. This is important. Medicines  Take over-the-counter and prescription medicines only as told by your health care provider. Follow directions carefully. Blood pressure medicines must be taken as prescribed.  Do not skip doses of blood pressure medicine. Doing this puts you at risk for problems and can make the medicine less effective.  Ask your health care provider about side effects or reactions to medicines that you should watch for. Contact a health care provider if:  You think you are having a reaction to a medicine you are taking.  You have headaches that keep coming back (recurring).  You feel dizzy.  You have swelling in your ankles.  You have trouble with your vision. Get help right away if:  You develop a severe headache or confusion.  You have unusual weakness or numbness.  You feel faint.  You have severe pain in your chest or abdomen.  You vomit repeatedly.  You have trouble breathing. Summary  Hypertension is when the force of blood pumping through your arteries is too strong. If this condition is not controlled, it may put you at risk for serious complications.  Your personal target blood pressure may vary depending on your medical conditions, your age, and other factors. For most people, a normal blood pressure is less than 120/80.  Hypertension is treated with lifestyle changes, medicines, or a combination of both. Lifestyle changes include weight loss, eating a healthy, low-sodium diet, exercising more, and limiting alcohol. This information is not intended to replace advice given to you by your health care provider. Make sure you discuss any questions you have with your health care provider. Document Released: 03/26/2005 Document Revised: 02/22/2016 Document Reviewed: 02/22/2016 Elsevier Interactive Patient Education  2018 Elsevier Inc.      Edwina Barth, MD [facility]

## 2018-02-04 NOTE — Patient Instructions (Addendum)
   If you have lab work done today you will be contacted with your lab results within the next 2 weeks.  If you have not heard from us then please contact us. The fastest way to get your results is to register for My Chart.   IF you received an x-ray today, you will receive an invoice from [facility]. Please contact [facility] at [phone] with questions or concerns regarding your invoice.   IF you received labwork today, you will receive an invoice from [facility]. Please contact [facility] at [phone] with questions or concerns regarding your invoice.   Our billing staff will not be able to assist you with questions regarding bills from these companies.  You will be contacted with the lab results as soon as they are available. The fastest way to get your results is to activate your My Chart account. Instructions are located on the last page of this paperwork. If you have not heard from us regarding the results in 2 weeks, please contact this office.     Hypertension Hypertension, commonly called high blood pressure, is when the force of blood pumping through the arteries is too strong. The arteries are the blood vessels that carry blood from the heart throughout the body. Hypertension forces the heart to work harder to pump blood and may cause arteries to become narrow or stiff. Having untreated or uncontrolled hypertension can cause heart attacks, strokes, kidney disease, and other problems. A blood pressure reading consists of a higher number over a lower number. Ideally, your blood pressure should be below 120/80. The first ("top") number is called the systolic pressure. It is a measure of the pressure in your arteries as your heart beats. The second ("bottom") number is called the diastolic pressure. It is a measure of the pressure in your arteries as the heart relaxes. What are the causes? The cause of this condition is not known. What increases the risk? Some  risk factors for high blood pressure are under your control. Others are not. Factors you can change  Smoking.  Having type 2 diabetes mellitus, high cholesterol, or both.  Not getting enough exercise or physical activity.  Being overweight.  Having too much fat, sugar, calories, or salt (sodium) in your diet.  Drinking too much alcohol. Factors that are difficult or impossible to change  Having chronic kidney disease.  Having a family history of high blood pressure.  Age. Risk increases with age.  Race. You may be at higher risk if you are African-American.  Gender. Men are at higher risk than women before age 45. After age 65, women are at higher risk than men.  Having obstructive sleep apnea.  Stress. What are the signs or symptoms? Extremely high blood pressure (hypertensive crisis) may cause:  Headache.  Anxiety.  Shortness of breath.  Nosebleed.  Nausea and vomiting.  Severe chest pain.  Jerky movements you cannot control (seizures).  How is this diagnosed? This condition is diagnosed by measuring your blood pressure while you are seated, with your arm resting on a surface. The cuff of the blood pressure monitor will be placed directly against the skin of your upper arm at the level of your heart. It should be measured at least twice using the same arm. Certain conditions can cause a difference in blood pressure between your right and left arms. Certain factors can cause blood pressure readings to be lower or higher than normal (elevated) for a short period of time:  When   your blood pressure is higher when you are in a health care provider's office than when you are at home, this is called white coat hypertension. Most people with this condition do not need medicines.  When your blood pressure is higher at home than when you are in a health care provider's office, this is called masked hypertension. Most people with this condition may need medicines to control  blood pressure.  If you have a high blood pressure reading during one visit or you have normal blood pressure with other risk factors:  You may be asked to return on a different day to have your blood pressure checked again.  You may be asked to monitor your blood pressure at home for 1 week or longer.  If you are diagnosed with hypertension, you may have other blood or imaging tests to help your health care provider understand your overall risk for other conditions. How is this treated? This condition is treated by making healthy lifestyle changes, such as eating healthy foods, exercising more, and reducing your alcohol intake. Your health care provider may prescribe medicine if lifestyle changes are not enough to get your blood pressure under control, and if:  Your systolic blood pressure is above 130.  Your diastolic blood pressure is above 80.  Your personal target blood pressure may vary depending on your medical conditions, your age, and other factors. Follow these instructions at home: Eating and drinking  Eat a diet that is high in fiber and potassium, and low in sodium, added sugar, and fat. An example eating plan is called the DASH (Dietary Approaches to Stop Hypertension) diet. To eat this way: ? Eat plenty of fresh fruits and vegetables. Try to fill half of your plate at each meal with fruits and vegetables. ? Eat whole grains, such as whole wheat pasta, brown rice, or whole grain bread. Fill about one quarter of your plate with whole grains. ? Eat or drink low-fat dairy products, such as skim milk or low-fat yogurt. ? Avoid fatty cuts of meat, processed or cured meats, and poultry with skin. Fill about one quarter of your plate with lean proteins, such as fish, chicken without skin, beans, eggs, and tofu. ? Avoid premade and processed foods. These tend to be higher in sodium, added sugar, and fat.  Reduce your daily sodium intake. Most people with hypertension should eat less  than 1,500 mg of sodium a day.  Limit alcohol intake to no more than 1 drink a day for nonpregnant women and 2 drinks a day for men. One drink equals 12 oz of beer, 5 oz of wine, or 1 oz of hard liquor. Lifestyle  Work with your health care provider to maintain a healthy body weight or to lose weight. Ask what an ideal weight is for you.  Get at least 30 minutes of exercise that causes your heart to beat faster (aerobic exercise) most days of the week. Activities may include walking, swimming, or biking.  Include exercise to strengthen your muscles (resistance exercise), such as pilates or lifting weights, as part of your weekly exercise routine. Try to do these types of exercises for 30 minutes at least 3 days a week.  Do not use any products that contain nicotine or tobacco, such as cigarettes and e-cigarettes. If you need help quitting, ask your health care provider.  Monitor your blood pressure at home as told by your health care provider.  Keep all follow-up visits as told by your health care provider.   This is important. Medicines  Take over-the-counter and prescription medicines only as told by your health care provider. Follow directions carefully. Blood pressure medicines must be taken as prescribed.  Do not skip doses of blood pressure medicine. Doing this puts you at risk for problems and can make the medicine less effective.  Ask your health care provider about side effects or reactions to medicines that you should watch for. Contact a health care provider if:  You think you are having a reaction to a medicine you are taking.  You have headaches that keep coming back (recurring).  You feel dizzy.  You have swelling in your ankles.  You have trouble with your vision. Get help right away if:  You develop a severe headache or confusion.  You have unusual weakness or numbness.  You feel faint.  You have severe pain in your chest or abdomen.  You vomit  repeatedly.  You have trouble breathing. Summary  Hypertension is when the force of blood pumping through your arteries is too strong. If this condition is not controlled, it may put you at risk for serious complications.  Your personal target blood pressure may vary depending on your medical conditions, your age, and other factors. For most people, a normal blood pressure is less than 120/80.  Hypertension is treated with lifestyle changes, medicines, or a combination of both. Lifestyle changes include weight loss, eating a healthy, low-sodium diet, exercising more, and limiting alcohol. This information is not intended to replace advice given to you by your health care provider. Make sure you discuss any questions you have with your health care provider. Document Released: 03/26/2005 Document Revised: 02/22/2016 Document Reviewed: 02/22/2016 Elsevier Interactive Patient Education  2018 [employer]vier Inc.  

## 2018-02-05 ENCOUNTER — Other Ambulatory Visit: Payer: Self-pay | Admitting: Emergency Medicine

## 2018-02-05 ENCOUNTER — Encounter: Payer: Self-pay | Admitting: Radiology

## 2018-02-05 DIAGNOSIS — E119 Type 2 diabetes mellitus without complications: Secondary | ICD-10-CM

## 2018-02-05 DIAGNOSIS — E785 Hyperlipidemia, unspecified: Secondary | ICD-10-CM

## 2018-02-05 LAB — LIPID PANEL
CHOLESTEROL TOTAL: 191 mg/dL (ref 100–199)
Chol/HDL Ratio: 4.7 ratio (ref 0.0–5.0)
HDL: 41 mg/dL (ref >39)
LDL CALC: 132 mg/dL — AB (ref 0–99)
Triglycerides: 91 mg/dL (ref 0–149)
VLDL Cholesterol Cal: 18 mg/dL (ref 5–40)

## 2018-02-05 LAB — COMPREHENSIVE METABOLIC PANEL
A/G RATIO: 1.3 (ref 1.2–2.2)
ALT: 30 IU/L (ref 0–44)
AST: 19 IU/L (ref 0–40)
Albumin: 4.1 g/dL (ref 3.6–4.8)
Alkaline Phosphatase: 55 IU/L (ref 39–117)
BUN/Creatinine Ratio: 10 (ref 10–24)
BUN: 14 mg/dL (ref 8–27)
Bilirubin Total: 0.8 mg/dL (ref 0.0–1.2)
CO2: 26 mmol/L (ref 20–29)
Calcium: 9.3 mg/dL (ref 8.6–10.2)
Chloride: 95 mmol/L — ABNORMAL LOW (ref 96–106)
Creatinine, Ser: 1.41 mg/dL — ABNORMAL HIGH (ref 0.76–1.27)
GFR calc non Af Amer: 53 mL/min/{1.73_m2} — ABNORMAL LOW (ref >59)
GFR, EST AFRICAN AMERICAN: 62 mL/min/{1.73_m2} (ref >59)
Globulin, Total: 3.1 g/dL (ref 1.5–4.5)
Glucose: 157 mg/dL — ABNORMAL HIGH (ref 65–99)
Potassium: 3.7 mmol/L (ref 3.5–5.2)
Sodium: 136 mmol/L (ref 134–144)
TOTAL PROTEIN: 7.2 g/dL (ref 6.0–8.5)

## 2018-02-05 LAB — HEMOGLOBIN A1C
Est. average glucose Bld gHb Est-mCnc: 192 mg/dL
Hgb A1c MFr Bld: 8.3 % — ABNORMAL HIGH (ref 4.8–5.6)

## 2018-02-05 MED ORDER — PRAVASTATIN SODIUM 20 MG PO TABS: 20.0000 mg | ORAL_TABLET | Freq: Every day | ORAL | 3 refills | Status: DC

## 2018-02-05 MED ORDER — METFORMIN HCL 500 MG PO TABS: 500.0000 mg | ORAL_TABLET | Freq: Two times a day (BID) | ORAL | 3 refills | Status: DC

## 2018-02-07 ENCOUNTER — Other Ambulatory Visit: Payer: Self-pay | Admitting: *Deleted

## 2018-02-07 DIAGNOSIS — E785 Hyperlipidemia, unspecified: Secondary | ICD-10-CM

## 2018-02-07 DIAGNOSIS — E119 Type 2 diabetes mellitus without complications: Secondary | ICD-10-CM

## 2018-02-07 MED ORDER — PRAVASTATIN SODIUM 20 MG PO TABS: 20.0000 mg | ORAL_TABLET | Freq: Every day | ORAL | 3 refills | Status: DC

## 2018-02-07 MED ORDER — METFORMIN HCL 500 MG PO TABS: 500.0000 mg | ORAL_TABLET | Freq: Two times a day (BID) | ORAL | 3 refills | Status: DC

## 2018-08-04 ENCOUNTER — Other Ambulatory Visit: Payer: Self-pay

## 2018-08-04 DIAGNOSIS — E119 Type 2 diabetes mellitus without complications: Secondary | ICD-10-CM

## 2018-08-04 DIAGNOSIS — I10 Essential (primary) hypertension: Secondary | ICD-10-CM

## 2018-08-04 DIAGNOSIS — E785 Hyperlipidemia, unspecified: Secondary | ICD-10-CM

## 2018-08-05 ENCOUNTER — Ambulatory Visit: Payer: Self-pay | Admitting: Emergency Medicine

## 2018-08-05 ENCOUNTER — Telehealth: Payer: Self-pay | Admitting: Emergency Medicine

## 2018-08-05 ENCOUNTER — Ambulatory Visit: Payer: Self-pay

## 2018-08-05 ENCOUNTER — Other Ambulatory Visit: Payer: Self-pay

## 2018-08-05 DIAGNOSIS — I10 Essential (primary) hypertension: Secondary | ICD-10-CM

## 2018-08-05 DIAGNOSIS — E119 Type 2 diabetes mellitus without complications: Secondary | ICD-10-CM

## 2018-08-05 DIAGNOSIS — E785 Hyperlipidemia, unspecified: Secondary | ICD-10-CM

## 2018-08-05 LAB — HEMOGLOBIN A1C
Est. average glucose Bld gHb Est-mCnc: 148 mg/dL
Hgb A1c MFr Bld: 6.8 % — ABNORMAL HIGH (ref 4.8–5.6)

## 2018-08-05 LAB — COMPREHENSIVE METABOLIC PANEL
ALT: 35 IU/L (ref 0–44)
AST: 19 IU/L (ref 0–40)
Albumin/Globulin Ratio: 1.5 (ref 1.2–2.2)
Albumin: 4.1 g/dL (ref 3.8–4.8)
Alkaline Phosphatase: 55 IU/L (ref 39–117)
BUN/Creatinine Ratio: 9 — ABNORMAL LOW (ref 10–24)
BUN: 13 mg/dL (ref 8–27)
Bilirubin Total: 0.6 mg/dL (ref 0.0–1.2)
CO2: 27 mmol/L (ref 20–29)
Calcium: 9.7 mg/dL (ref 8.6–10.2)
Chloride: 101 mmol/L (ref 96–106)
Creatinine, Ser: 1.39 mg/dL — ABNORMAL HIGH (ref 0.76–1.27)
GFR calc Af Amer: 63 mL/min/{1.73_m2} (ref >59)
GFR calc non Af Amer: 54 mL/min/{1.73_m2} — ABNORMAL LOW (ref >59)
Globulin, Total: 2.7 g/dL (ref 1.5–4.5)
Glucose: 127 mg/dL — ABNORMAL HIGH (ref 65–99)
Potassium: 3.7 mmol/L (ref 3.5–5.2)
Sodium: 142 mmol/L (ref 134–144)
Total Protein: 6.8 g/dL (ref 6.0–8.5)

## 2018-08-05 LAB — CBC WITH DIFFERENTIAL/PLATELET
Basophils Absolute: 0.1 10*3/uL (ref 0.0–0.2)
Basos: 1 %
EOS (ABSOLUTE): 0.1 10*3/uL (ref 0.0–0.4)
Eos: 1 %
Hematocrit: 43.4 % (ref 37.5–51.0)
Hemoglobin: 14.2 g/dL (ref 13.0–17.7)
Immature Grans (Abs): 0 10*3/uL (ref 0.0–0.1)
Immature Granulocytes: 0 %
Lymphocytes Absolute: 2.7 10*3/uL (ref 0.7–3.1)
Lymphs: 37 %
MCH: 27.9 pg (ref 26.6–33.0)
MCHC: 32.7 g/dL (ref 31.5–35.7)
MCV: 85 fL (ref 79–97)
Monocytes Absolute: 0.7 10*3/uL (ref 0.1–0.9)
Monocytes: 9 %
Neutrophils Absolute: 3.7 10*3/uL (ref 1.4–7.0)
Neutrophils: 52 %
Platelets: 277 10*3/uL (ref 150–450)
RBC: 5.09 x10E6/uL (ref 4.14–5.80)
RDW: 12.5 % (ref 11.6–15.4)
WBC: 7.3 10*3/uL (ref 3.4–10.8)

## 2018-08-05 LAB — LIPID PANEL
Chol/HDL Ratio: 3.5 ratio (ref 0.0–5.0)
Cholesterol, Total: 181 mg/dL (ref 100–199)
HDL: 52 mg/dL (ref >39)
LDL Calculated: 111 mg/dL — ABNORMAL HIGH (ref 0–99)
Triglycerides: 91 mg/dL (ref 0–149)
VLDL Cholesterol Cal: 18 mg/dL (ref 5–40)

## 2018-08-05 NOTE — Telephone Encounter (Signed)
Pt called regarding scheduling an appointment. Call conference with flow at PCP.

## 2018-08-06 ENCOUNTER — Other Ambulatory Visit: Payer: Self-pay

## 2018-08-06 ENCOUNTER — Telehealth (INDEPENDENT_AMBULATORY_CARE_PROVIDER_SITE_OTHER): Payer: Self-pay | Admitting: Emergency Medicine

## 2018-08-06 ENCOUNTER — Encounter: Payer: Self-pay | Admitting: Emergency Medicine

## 2018-08-06 DIAGNOSIS — I152 Hypertension secondary to endocrine disorders: Secondary | ICD-10-CM

## 2018-08-06 DIAGNOSIS — E1169 Type 2 diabetes mellitus with other specified complication: Secondary | ICD-10-CM | POA: Insufficient documentation

## 2018-08-06 DIAGNOSIS — E1159 Type 2 diabetes mellitus with other circulatory complications: Secondary | ICD-10-CM

## 2018-08-06 DIAGNOSIS — E785 Hyperlipidemia, unspecified: Secondary | ICD-10-CM

## 2018-08-06 DIAGNOSIS — I10 Essential (primary) hypertension: Secondary | ICD-10-CM

## 2018-08-06 DIAGNOSIS — E1165 Type 2 diabetes mellitus with hyperglycemia: Secondary | ICD-10-CM

## 2018-08-06 MED ORDER — PRAVASTATIN SODIUM 20 MG PO TABS: 20.0000 mg | ORAL_TABLET | Freq: Every day | ORAL | 3 refills | Status: DC

## 2018-08-06 NOTE — Progress Notes (Signed)
BP Readings from Last 3 Encounters:  02/04/18 128/75  01/05/18 137/84  05/10/17 (!) 129/56   Lab Results  Component Value Date   HGBA1C 6.8 (H) 08/05/2018   Lab Results  Component Value Date   CHOL 181 08/05/2018   HDL 52 08/05/2018   LDLCALC 111 (H) 08/05/2018   TRIG 91 08/05/2018   CHOLHDL 3.5 08/05/2018   The 10-year ASCVD risk score Denman George DC Jr., et al., 2013) is: 15%   Values used to calculate the score:     Age: 63 years     Sex: Male     Is Non-Hispanic African American: Yes     Diabetic: Yes     Tobacco smoker: No     Systolic Blood Pressure: 128 mmHg     Is BP treated: No     HDL Cholesterol: 52 mg/dL     Total Cholesterol: 181 mg/dL Lab Results  Component Value Date   CREATININE 1.39 (H) 08/05/2018   BUN 13 08/05/2018   NA 142 08/05/2018   K 3.7 08/05/2018   CL 101 08/05/2018   CO2 27 08/05/2018      Telemedicine Encounter- SOAP NOTE Established Patient  This telephone encounter was conducted with the patient's (or proxy's) verbal consent via audio telecommunications: yes/no: Yes Patient was instructed to have this encounter in a suitably private space; and to only have persons present to whom they give permission to participate. In addition, patient identity was confirmed by use of name plus two identifiers (DOB and address).  I discussed the limitations, risks, security and privacy concerns of performing an evaluation and management service by telephone and the availability of in person appointments. I also discussed with the patient that there may be a patient responsible charge related to this service. The patient expressed understanding and agreed to proceed.  I spent a total of TIME; 0 MIN TO 60 MIN: 15 minutes talking with the patient or their proxy.  No chief complaint on file. Blood pressure follow-up  Subjective   Larry Dawson is a 63 y.o. male established patient. Telephone visit today for hypertension follow-up.  Also has a history of type  2 diabetes.  Blood work done yesterday showed the following: Results for orders placed or performed in visit on 08/05/18 (from the past 24 hour(s))  CBC with Differential/Platelet     Status: None   Collection Time: 08/05/18 11:49 AM  Result Value Ref Range   WBC 7.3 3.4 - 10.8 x10E3/uL   RBC 5.09 4.14 - 5.80 x10E6/uL   Hemoglobin 14.2 13.0 - 17.7 g/dL   Hematocrit 19.1 47.8 - 51.0 %   MCV 85 79 - 97 fL   MCH 27.9 26.6 - 33.0 pg   MCHC 32.7 31.5 - 35.7 g/dL   RDW 29.5 62.1 - 30.8 %   Platelets 277 150 - 450 x10E3/uL   Neutrophils 52 Not Estab. %   Lymphs 37 Not Estab. %   Monocytes 9 Not Estab. %   Eos 1 Not Estab. %   Basos 1 Not Estab. %   Neutrophils Absolute 3.7 1.4 - 7.0 x10E3/uL   Lymphocytes Absolute 2.7 0.7 - 3.1 x10E3/uL   Monocytes Absolute 0.7 0.1 - 0.9 x10E3/uL   EOS (ABSOLUTE) 0.1 0.0 - 0.4 x10E3/uL   Basophils Absolute 0.1 0.0 - 0.2 x10E3/uL   Immature Granulocytes 0 Not Estab. %   Immature Grans (Abs) 0.0 0.0 - 0.1 x10E3/uL   Narrative   Performed at:  [facility]  [place] [address] [place], [place]  [id number] Lab Director: Jolene Schimke MD, Phone:  [phone]  Lipid panel     Status: Abnormal   Collection Time: 08/05/18 11:49 AM  Result Value Ref Range   Cholesterol, Total 181 100 - 199 mg/dL   Triglycerides 91 0 - 149 mg/dL   HDL 52 >75 mg/dL   VLDL Cholesterol Cal 18 5 - 40 mg/dL   LDL Calculated 300 (H) 0 - 99 mg/dL   Chol/HDL Ratio 3.5 0.0 - 5.0 ratio   Narrative   Performed at:  [address] [place] [address], [place], [place]  [id number] Lab Director: Jolene Schimke MD, Phone:  [phone]  Hemoglobin A1c     Status: Abnormal   Collection Time: 08/05/18 11:49 AM  Result Value Ref Range   Hgb A1c MFr Bld 6.8 (H) 4.8 - 5.6 %   Est. average glucose Bld gHb Est-mCnc 148 mg/dL   Narrative   Performed at:  [address] [place] [address] [place], [place]  [id number] Lab Director: Jolene Schimke MD, Phone:  [phone]   Comprehensive metabolic panel     Status: Abnormal   Collection Time: 08/05/18 11:49 AM  Result Value Ref Range   Glucose 127 (H) 65 - 99 mg/dL   BUN 13 8 - 27 mg/dL   Creatinine, Ser 0.15 (H) 0.76 - 1.27 mg/dL   GFR calc non Af Amer 54 (L) >59 mL/min/1.73   GFR calc Af Amer 63 >59 mL/min/1.73   BUN/Creatinine Ratio 9 (L) 10 - 24   Sodium 142 134 - 144 mmol/L   Potassium 3.7 3.5 - 5.2 mmol/L   Chloride 101 96 - 106 mmol/L   CO2 27 20 - 29 mmol/L   Calcium 9.7 8.6 - 10.2 mg/dL   Total Protein 6.8 6.0 - 8.5 g/dL   Albumin 4.1 3.8 - 4.8 g/dL   Globulin, Total 2.7 1.5 - 4.5 g/dL   Albumin/Globulin Ratio 1.5 1.2 - 2.2   Bilirubin Total 0.6 0.0 - 1.2 mg/dL   Alkaline Phosphatase 55 39 - 117 IU/L   AST 19 0 - 40 IU/L   ALT 35 0 - 44 IU/L   Narrative   Performed at:  [address] [place] [address] [place], [place]  [id number] Lab Director: Jolene Schimke MD, Phone:  [phone]   Has no complaints or medical concerns today.  HPI   Patient Active Problem List   Diagnosis Date Noted  . Prediabetes 02/04/2018  . Hyperglycemia 07/02/2013  . COPD (chronic obstructive pulmonary disease) (HCC) 11/30/2012  . HTN (hypertension) 07/18/2011    Past Medical History:  Diagnosis Date  . Allergy   . Anal fissure   . COPD (chronic obstructive pulmonary disease) (HCC)    Albuterol PRN.  Marland Kitchen Hydrocele   . Hypertension   . IBS (irritable bowel syndrome) 05/22/13   per chart note-pt denied    Current Outpatient Medications  Medication Sig Dispense Refill  . aspirin 81 MG tablet Take 81 mg by mouth daily.    Marland Kitchen BLACK CURRANT SEED OIL PO Take by mouth daily.    . cetirizine (ZYRTEC) 10 MG tablet Take 1 tablet (10 mg total) by mouth daily. 30 tablet 0  . cholecalciferol (VITAMIN D) 1000 units tablet Take 1,000 Units by mouth daily.    . lansoprazole (PREVACID) 15 MG capsule Take 15 mg by mouth as needed.     . metFORMIN (GLUCOPHAGE) 500 MG tablet Take 1 tablet (500 mg total) by  mouth  2 (two) times daily with a meal. 180 tablet 3  . Omega-3 Fatty Acids (FISH OIL) 1000 MG CAPS Take by mouth.    . pravastatin (PRAVACHOL) 20 MG tablet Take 1 tablet (20 mg total) by mouth daily. 90 tablet 3  . atenolol-chlorthalidone (TENORETIC) 50-25 MG tablet Take 1 tablet by mouth daily. 90 tablet 3  . mupirocin ointment (BACTROBAN) 2 % Apply 1 application topically 3 (three) times daily. 30 g 0   No current facility-administered medications for this visit.     Allergies  Allergen Reactions  . Ace Inhibitors Swelling    Suspected angioedema  . Penicillins Hives, Rash and Other (See Comments)    Throat swells  . Latex Hives, Swelling and Rash    Social History   Socioeconomic History  . Marital status: Married    Spouse name: Not on file  . Number of children: Not on file  . Years of education: Not on file  . Highest education level: Not on file  Occupational History  . Not on file  Social Needs  . Financial resource strain: Not on file  . Food insecurity:    Worry: Not on file    Inability: Not on file  . Transportation needs:    Medical: Not on file    Non-medical: Not on file  Tobacco Use  . Smoking status: Former Games developer  . Smokeless tobacco: Never Used  Substance and Sexual Activity  . Alcohol use: No  . Drug use: No  . Sexual activity: Never  Lifestyle  . Physical activity:    Days per week: Not on file    Minutes per session: Not on file  . Stress: Not on file  Relationships  . Social connections:    Talks on phone: Not on file    Gets together: Not on file    Attends religious service: Not on file    Active member of club or organization: Not on file    Attends meetings of clubs or organizations: Not on file    Relationship status: Not on file  . Intimate partner violence:    Fear of current or ex partner: Not on file    Emotionally abused: Not on file    Physically abused: Not on file    Forced sexual activity: Not on file  Other Topics Concern   . Not on file  Social History Narrative   Marital status: married      Employment: [employer]; drives all over.  Owns company.      Tobacco:  Quit smoking in 2005; smoked 25 years      Alcohol:  Rare beer      Drugs:  None      Exercise:  None   Daily caffeine     Review of Systems  Constitutional: Negative.  Negative for chills and fever.  HENT: Negative.  Negative for congestion, nosebleeds and sore throat.   Eyes: Negative.   Respiratory: Negative.  Negative for cough and shortness of breath.   Cardiovascular: Negative.  Negative for chest pain and palpitations.  Gastrointestinal: Negative.  Negative for abdominal pain, diarrhea, nausea and vomiting.  Musculoskeletal: Negative for myalgias.  Skin: Negative.  Negative for rash.  Neurological: Negative for dizziness and headaches.  Endo/Heme/Allergies: Negative.   All other systems reviewed and are negative.   Objective   Vitals as reported by the patient: None available There were no vitals filed for this visit. Awake and oriented x3 in no  apparent respiratory distress Diagnoses and all orders for this visit:  Hypertension associated with type 2 diabetes mellitus (HCC)  Dyslipidemia -     pravastatin (PRAVACHOL) 20 MG tablet; Take 1 tablet (20 mg total) by mouth daily.  Type 2 diabetes mellitus with hyperglycemia, without long-term current use of insulin (HCC)  Essential hypertension  Well controlled hypertension.  Continue present medications. Hemoglobin A1c much better than 6 months ago.  Continue metformin and diet/exercise. Lipid profile acceptable.  Continue pravastatin and diet. Follow-up in the office in 6 months.    I discussed the assessment and treatment plan with the patient. The patient was provided an opportunity to ask questions and all were answered. The patient agreed with the plan and demonstrated an understanding of the instructions.   The patient was advised to call back or seek an  in-person evaluation if the symptoms worsen or if the condition fails to improve as anticipated.  I provided 15 minutes of non-face-to-face time during this encounter.  Georgina QuintMiguel Jose Marselino Slayton, MD  [facility]

## 2018-08-06 NOTE — Progress Notes (Signed)
Called patient to triage for appointment. Patient is following up 6 months on his blood pressure. Patient states he does not take readings on his blood pressure.  I advised patient how to do his foot exam at home until he has a office visit. Patient will need a refill on Pravastatin.

## 2018-10-29 ENCOUNTER — Other Ambulatory Visit: Payer: Self-pay

## 2018-10-29 DIAGNOSIS — Z20822 Contact with and (suspected) exposure to covid-19: Secondary | ICD-10-CM

## 2018-11-01 LAB — NOVEL CORONAVIRUS, NAA: SARS-CoV-2, NAA: NOT DETECTED

## 2019-02-03 ENCOUNTER — Other Ambulatory Visit: Payer: Self-pay | Admitting: Emergency Medicine

## 2019-02-03 DIAGNOSIS — I10 Essential (primary) hypertension: Secondary | ICD-10-CM

## 2019-02-03 NOTE — Telephone Encounter (Signed)
30 day courtesy given

## 2019-02-19 ENCOUNTER — Other Ambulatory Visit: Payer: Self-pay | Admitting: *Deleted

## 2019-02-19 ENCOUNTER — Ambulatory Visit (INDEPENDENT_AMBULATORY_CARE_PROVIDER_SITE_OTHER): Payer: Self-pay | Admitting: Emergency Medicine

## 2019-02-19 ENCOUNTER — Encounter: Payer: Self-pay | Admitting: Emergency Medicine

## 2019-02-19 ENCOUNTER — Other Ambulatory Visit: Payer: Self-pay

## 2019-02-19 VITALS — BP 132/76 | HR 69 | Temp 98.6°F | Resp 16 | Ht 69.0 in | Wt 240.2 lb

## 2019-02-19 DIAGNOSIS — E1169 Type 2 diabetes mellitus with other specified complication: Secondary | ICD-10-CM

## 2019-02-19 DIAGNOSIS — E1165 Type 2 diabetes mellitus with hyperglycemia: Secondary | ICD-10-CM

## 2019-02-19 DIAGNOSIS — I10 Essential (primary) hypertension: Secondary | ICD-10-CM

## 2019-02-19 DIAGNOSIS — E785 Hyperlipidemia, unspecified: Secondary | ICD-10-CM

## 2019-02-19 DIAGNOSIS — Z0001 Encounter for general adult medical examination with abnormal findings: Secondary | ICD-10-CM

## 2019-02-19 DIAGNOSIS — E1159 Type 2 diabetes mellitus with other circulatory complications: Secondary | ICD-10-CM

## 2019-02-19 LAB — POCT GLYCOSYLATED HEMOGLOBIN (HGB A1C): Hemoglobin A1C: 6.7 % — AB (ref 4.0–5.6)

## 2019-02-19 LAB — GLUCOSE, POCT (MANUAL RESULT ENTRY): POC Glucose: 123 mg/dl — AB (ref 70–99)

## 2019-02-19 MED ORDER — ATENOLOL-CHLORTHALIDONE 50-25 MG PO TABS: 1.0000 | ORAL_TABLET | Freq: Every day | ORAL | 3 refills | Status: DC

## 2019-02-19 NOTE — Patient Instructions (Addendum)
   If you have lab work done today you will be contacted with your lab results within the next 2 weeks.  If you have not heard from us then please contact us. The fastest way to get your results is to register for My Chart.   IF you received an x-ray today, you will receive an invoice from [facility]. Please contact [facility] at [phone] with questions or concerns regarding your invoice.   IF you received labwork today, you will receive an invoice from [facility]. Please contact [facility] at [phone] with questions or concerns regarding your invoice.   Our billing staff will not be able to assist you with questions regarding bills from these companies.  You will be contacted with the lab results as soon as they are available. The fastest way to get your results is to activate your My Chart account. Instructions are located on the last page of this paperwork. If you have not heard from us regarding the results in 2 weeks, please contact this office.      Health Maintenance, Male Adopting a healthy lifestyle and getting preventive care are important in promoting health and wellness. Ask your health care provider about:  The right schedule for you to have regular tests and exams.  Things you can do on your own to prevent diseases and keep yourself healthy. What should I know about diet, weight, and exercise? Eat a healthy diet   Eat a diet that includes plenty of vegetables, fruits, low-fat dairy products, and lean protein.  Do not eat a lot of foods that are high in solid fats, added sugars, or sodium. Maintain a healthy weight Body mass index (BMI) is a measurement that can be used to identify possible weight problems. It estimates body fat based on height and weight. Your health care provider can help determine your BMI and help you achieve or maintain a healthy weight. Get regular exercise Get regular exercise. This is one of the most important things you  can do for your health. Most adults should:  Exercise for at least 150 minutes each week. The exercise should increase your heart rate and make you sweat (moderate-intensity exercise).  Do strengthening exercises at least twice a week. This is in addition to the moderate-intensity exercise.  Spend less time sitting. Even light physical activity can be beneficial. Watch cholesterol and blood lipids Have your blood tested for lipids and cholesterol at 62 years of age, then have this test every 5 years. You may need to have your cholesterol levels checked more often if:  Your lipid or cholesterol levels are high.  You are older than 62 years of age.  You are at high risk for heart disease. What should I know about cancer screening? Many types of cancers can be detected early and may often be prevented. Depending on your health history and family history, you may need to have cancer screening at various ages. This may include screening for:  Colorectal cancer.  Prostate cancer.  Skin cancer.  Lung cancer. What should I know about heart disease, diabetes, and high blood pressure? Blood pressure and heart disease  High blood pressure causes heart disease and increases the risk of stroke. This is more likely to develop in people who have high blood pressure readings, are of African descent, or are overweight.  Talk with your health care provider about your target blood pressure readings.  Have your blood pressure checked: ? Every 3-5 years if you are 18-39   years of age. ? Every year if you are 56 years old or older.  If you are between the ages of 82 and 58 and are a current or former smoker, ask your health care provider if you should have a one-time screening for abdominal aortic aneurysm (AAA). Diabetes Have regular diabetes screenings. This checks your fasting blood sugar level. Have the screening done:  Once every three years after age 3 if you are at a normal weight and have  a low risk for diabetes.  More often and at a younger age if you are overweight or have a high risk for diabetes. What should I know about preventing infection? Hepatitis B If you have a higher risk for hepatitis B, you should be screened for this virus. Talk with your health care provider to find out if you are at risk for hepatitis B infection. Hepatitis C Blood testing is recommended for:  Everyone born from [birth year] through 1964.  Anyone with known risk factors for hepatitis C. Sexually transmitted infections (STIs)  You should be screened each year for STIs, including gonorrhea and chlamydia, if: ? You are sexually active and are younger than 62 years of age. ? You are older than 62 years of age and your health care provider tells you that you are at risk for this type of infection. ? Your sexual activity has changed since you were last screened, and you are at increased risk for chlamydia or gonorrhea. Ask your health care provider if you are at risk.  Ask your health care provider about whether you are at high risk for HIV. Your health care provider may recommend a prescription medicine to help prevent HIV infection. If you choose to take medicine to prevent HIV, you should first get tested for HIV. You should then be tested every 3 months for as long as you are taking the medicine. Follow these instructions at home: Lifestyle  Do not use any products that contain nicotine or tobacco, such as cigarettes, e-cigarettes, and chewing tobacco. If you need help quitting, ask your health care provider.  Do not use street drugs.  Do not share needles.  Ask your health care provider for help if you need support or information about quitting drugs. Alcohol use  Do not drink alcohol if your health care provider tells you not to drink.  If you drink alcohol: ? Limit how much you have to 0-2 drinks a day. ? Be aware of how much alcohol is in your drink. In the [place] one drink equals one 12  oz bottle of beer (355 mL), one 5 oz glass of wine (148 mL), or one 1 oz glass of hard liquor (44 mL). General instructions  Schedule regular health, dental, and eye exams.  Stay current with your vaccines.  Tell your health care provider if: ? You often feel depressed. ? You have ever been abused or do not feel safe at home. Summary  Adopting a healthy lifestyle and getting preventive care are important in promoting health and wellness.  Follow your health care provider's instructions about healthy diet, exercising, and getting tested or screened for diseases.  Follow your health care provider's instructions on monitoring your cholesterol and blood pressure. This information is not intended to replace advice given to you by your health care provider. Make sure you discuss any questions you have with your health care provider. Document Released: 09/22/2007 Document Revised: 03/19/2018 Document Reviewed: 03/19/2018 Elsevier Patient Education  2020 [employer].  Diabetes Mellitus  and Nutrition, Adult When you have diabetes (diabetes mellitus), it is very important to have healthy eating habits because your blood sugar (glucose) levels are greatly affected by what you eat and drink. Eating healthy foods in the appropriate amounts, at about the same times every day, can help you:  Control your blood glucose.  Lower your risk of heart disease.  Improve your blood pressure.  Reach or maintain a healthy weight. Every person with diabetes is different, and each person has different needs for a meal plan. Your health care provider may recommend that you work with a diet and nutrition specialist (dietitian) to make a meal plan that is best for you. Your meal plan may vary depending on factors such as:  The calories you need.  The medicines you take.  Your weight.  Your blood glucose, blood pressure, and cholesterol levels.  Your activity level.  Other health conditions you have,  such as heart or kidney disease. How do carbohydrates affect me? Carbohydrates, also called carbs, affect your blood glucose level more than any other type of food. Eating carbs naturally raises the amount of glucose in your blood. Carb counting is a method for keeping track of how many carbs you eat. Counting carbs is important to keep your blood glucose at a healthy level, especially if you use insulin or take certain oral diabetes medicines. It is important to know how many carbs you can safely have in each meal. This is different for every person. Your dietitian can help you calculate how many carbs you should have at each meal and for each snack. Foods that contain carbs include:  Bread, cereal, rice, pasta, and crackers.  Potatoes and corn.  Peas, beans, and lentils.  Milk and yogurt.  Fruit and juice.  Desserts, such as cakes, cookies, ice cream, and candy. How does alcohol affect me? Alcohol can cause a sudden decrease in blood glucose (hypoglycemia), especially if you use insulin or take certain oral diabetes medicines. Hypoglycemia can be a life-threatening condition. Symptoms of hypoglycemia (sleepiness, dizziness, and confusion) are similar to symptoms of having too much alcohol. If your health care provider says that alcohol is safe for you, follow these guidelines:  Limit alcohol intake to no more than 1 drink per day for nonpregnant women and 2 drinks per day for men. One drink equals 12 oz of beer, 5 oz of wine, or 1 oz of hard liquor.  Do not drink on an empty stomach.  Keep yourself hydrated with water, diet soda, or unsweetened iced tea.  Keep in mind that regular soda, juice, and other mixers may contain a lot of sugar and must be counted as carbs. What are tips for following this plan?  Reading food labels  Start by checking the serving size on the "Nutrition Facts" label of packaged foods and drinks. The amount of calories, carbs, fats, and other nutrients  listed on the label is based on one serving of the item. Many items contain more than one serving per package.  Check the total grams (g) of carbs in one serving. You can calculate the number of servings of carbs in one serving by dividing the total carbs by 15. For example, if a food has 30 g of total carbs, it would be equal to 2 servings of carbs.  Check the number of grams (g) of saturated and trans fats in one serving. Choose foods that have low or no amount of these fats.  Check the number of milligrams (  mg) of salt (sodium) in one serving. Most people should limit total sodium intake to less than 2,300 mg per day.  Always check the nutrition information of foods labeled as "low-fat" or "nonfat". These foods may be higher in added sugar or refined carbs and should be avoided.  Talk to your dietitian to identify your daily goals for nutrients listed on the label. Shopping  Avoid buying canned, premade, or processed foods. These foods tend to be high in fat, sodium, and added sugar.  Shop around the outside edge of the grocery store. This includes fresh fruits and vegetables, bulk grains, fresh meats, and fresh dairy. Cooking  Use low-heat cooking methods, such as baking, instead of high-heat cooking methods like deep frying.  Cook using healthy oils, such as olive, canola, or sunflower oil.  Avoid cooking with butter, cream, or high-fat meats. Meal planning  Eat meals and snacks regularly, preferably at the same times every day. Avoid going long periods of time without eating.  Eat foods high in fiber, such as fresh fruits, vegetables, beans, and whole grains. Talk to your dietitian about how many servings of carbs you can eat at each meal.  Eat 4-6 ounces (oz) of lean protein each day, such as lean meat, chicken, fish, eggs, or tofu. One oz of lean protein is equal to: ? 1 oz of meat, chicken, or fish. ? 1 egg. ?  cup of tofu.  Eat some foods each day that contain healthy  fats, such as avocado, nuts, seeds, and fish. Lifestyle  Check your blood glucose regularly.  Exercise regularly as told by your health care provider. This may include: ? 150 minutes of moderate-intensity or vigorous-intensity exercise each week. This could be brisk walking, biking, or water aerobics. ? Stretching and doing strength exercises, such as yoga or weightlifting, at least 2 times a week.  Take medicines as told by your health care provider.  Do not use any products that contain nicotine or tobacco, such as cigarettes and e-cigarettes. If you need help quitting, ask your health care provider.  Work with a Veterinary surgeon or diabetes educator to identify strategies to manage stress and any emotional and social challenges. Questions to ask a health care provider  Do I need to meet with a diabetes educator?  Do I need to meet with a dietitian?  What number can I call if I have questions?  When are the best times to check my blood glucose? Where to find more information:  American Diabetes Association: diabetes.org  Academy of Nutrition and Dietetics: www.eatright.[employer] of Diabetes and Digestive and Kidney Diseases (NIH): CarFli[employer].tn Summary  A healthy meal plan will help you control your blood glucose and maintain a healthy lifestyle.  Working with a diet and nutrition specialist (dietitian) can help you make a meal plan that is best for you.  Keep in mind that carbohydrates (carbs) and alcohol have immediate effects on your blood glucose levels. It is important to count carbs and to use alcohol carefully. This information is not intended to replace advice given to you by your health care provider. Make sure you discuss any questions you have with your health care provider. Document Released: 12/21/2004 Document Revised: 03/08/2017 Document Reviewed: 04/30/2016 Elsevier Patient Education  2020 [employer].  Hypertension, Adult High blood pressure  (hypertension) is when the force of blood pumping through the arteries is too strong. The arteries are the blood vessels that carry blood from the heart throughout the body. Hypertension forces  the heart to work harder to pump blood and may cause arteries to become narrow or stiff. Untreated or uncontrolled hypertension can cause a heart attack, heart failure, a stroke, kidney disease, and other problems. A blood pressure reading consists of a higher number over a lower number. Ideally, your blood pressure should be below 120/80. The first ("top") number is called the systolic pressure. It is a measure of the pressure in your arteries as your heart beats. The second ("bottom") number is called the diastolic pressure. It is a measure of the pressure in your arteries as the heart relaxes. What are the causes? The exact cause of this condition is not known. There are some conditions that result in or are related to high blood pressure. What increases the risk? Some risk factors for high blood pressure are under your control. The following factors may make you more likely to develop this condition:  Smoking.  Having type 2 diabetes mellitus, high cholesterol, or both.  Not getting enough exercise or physical activity.  Being overweight.  Having too much fat, sugar, calories, or salt (sodium) in your diet.  Drinking too much alcohol. Some risk factors for high blood pressure may be difficult or impossible to change. Some of these factors include:  Having chronic kidney disease.  Having a family history of high blood pressure.  Age. Risk increases with age.  Race. You may be at higher risk if you are African American.  Gender. Men are at higher risk than women before age 76. After age 13, women are at higher risk than men.  Having obstructive sleep apnea.  Stress. What are the signs or symptoms? High blood pressure may not cause symptoms. Very high blood pressure (hypertensive crisis) may  cause:  Headache.  Anxiety.  Shortness of breath.  Nosebleed.  Nausea and vomiting.  Vision changes.  Severe chest pain.  Seizures. How is this diagnosed? This condition is diagnosed by measuring your blood pressure while you are seated, with your arm resting on a flat surface, your legs uncrossed, and your feet flat on the floor. The cuff of the blood pressure monitor will be placed directly against the skin of your upper arm at the level of your heart. It should be measured at least twice using the same arm. Certain conditions can cause a difference in blood pressure between your right and left arms. Certain factors can cause blood pressure readings to be lower or higher than normal for a short period of time:  When your blood pressure is higher when you are in a health care provider's office than when you are at home, this is called white coat hypertension. Most people with this condition do not need medicines.  When your blood pressure is higher at home than when you are in a health care provider's office, this is called masked hypertension. Most people with this condition may need medicines to control blood pressure. If you have a high blood pressure reading during one visit or you have normal blood pressure with other risk factors, you may be asked to:  Return on a different day to have your blood pressure checked again.  Monitor your blood pressure at home for 1 week or longer. If you are diagnosed with hypertension, you may have other blood or imaging tests to help your health care provider understand your overall risk for other conditions. How is this treated? This condition is treated by making healthy lifestyle changes, such as eating healthy foods, exercising more, and  reducing your alcohol intake. Your health care provider may prescribe medicine if lifestyle changes are not enough to get your blood pressure under control, and if:  Your systolic blood pressure is above  130.  Your diastolic blood pressure is above 80. Your personal target blood pressure may vary depending on your medical conditions, your age, and other factors. Follow these instructions at home: Eating and drinking   Eat a diet that is high in fiber and potassium, and low in sodium, added sugar, and fat. An example eating plan is called the DASH (Dietary Approaches to Stop Hypertension) diet. To eat this way: ? Eat plenty of fresh fruits and vegetables. Try to fill one half of your plate at each meal with fruits and vegetables. ? Eat whole grains, such as whole-wheat pasta, brown rice, or whole-grain bread. Fill about one fourth of your plate with whole grains. ? Eat or drink low-fat dairy products, such as skim milk or low-fat yogurt. ? Avoid fatty cuts of meat, processed or cured meats, and poultry with skin. Fill about one fourth of your plate with lean proteins, such as fish, chicken without skin, beans, eggs, or tofu. ? Avoid pre-made and processed foods. These tend to be higher in sodium, added sugar, and fat.  Reduce your daily sodium intake. Most people with hypertension should eat less than 1,500 mg of sodium a day.  Do not drink alcohol if: ? Your health care provider tells you not to drink. ? You are pregnant, may be pregnant, or are planning to become pregnant.  If you drink alcohol: ? Limit how much you use to:  0-1 drink a day for women.  0-2 drinks a day for men. ? Be aware of how much alcohol is in your drink. In the [place] one drink equals one 12 oz bottle of beer (355 mL), one 5 oz glass of wine (148 mL), or one 1 oz glass of hard liquor (44 mL). Lifestyle   Work with your health care provider to maintain a healthy body weight or to lose weight. Ask what an ideal weight is for you.  Get at least 30 minutes of exercise most days of the week. Activities may include walking, swimming, or biking.  Include exercise to strengthen your muscles (resistance exercise),  such as Pilates or lifting weights, as part of your weekly exercise routine. Try to do these types of exercises for 30 minutes at least 3 days a week.  Do not use any products that contain nicotine or tobacco, such as cigarettes, e-cigarettes, and chewing tobacco. If you need help quitting, ask your health care provider.  Monitor your blood pressure at home as told by your health care provider.  Keep all follow-up visits as told by your health care provider. This is important. Medicines  Take over-the-counter and prescription medicines only as told by your health care provider. Follow directions carefully. Blood pressure medicines must be taken as prescribed.  Do not skip doses of blood pressure medicine. Doing this puts you at risk for problems and can make the medicine less effective.  Ask your health care provider about side effects or reactions to medicines that you should watch for. Contact a health care provider if you:  Think you are having a reaction to a medicine you are taking.  Have headaches that keep coming back (recurring).  Feel dizzy.  Have swelling in your ankles.  Have trouble with your vision. Get help right away if you:  Develop a severe headache  or confusion.  Have unusual weakness or numbness.  Feel faint.  Have severe pain in your chest or abdomen.  Vomit repeatedly.  Have trouble breathing. Summary  Hypertension is when the force of blood pumping through your arteries is too strong. If this condition is not controlled, it may put you at risk for serious complications.  Your personal target blood pressure may vary depending on your medical conditions, your age, and other factors. For most people, a normal blood pressure is less than 120/80.  Hypertension is treated with lifestyle changes, medicines, or a combination of both. Lifestyle changes include losing weight, eating a healthy, low-sodium diet, exercising more, and limiting alcohol. This  information is not intended to replace advice given to you by your health care provider. Make sure you discuss any questions you have with your health care provider. Document Released: 03/26/2005 Document Revised: 12/04/2017 Document Reviewed: 12/04/2017 Elsevier Patient Education  2020 [employer].

## 2019-02-19 NOTE — Assessment & Plan Note (Signed)
Blood pressure elevated but patient did not take medication this morning.  Continue Tenoretic, same dose. Hemoglobin A1c at 6.7.  Patient advised to take Metformin 500 mg twice a day as opposed to once a day only. Diet and nutrition discussed with patient at length. Continue pravastatin.  Lipid panel while fasting done today. Follow-up in 6 months.

## 2019-02-19 NOTE — Progress Notes (Signed)
Lab Results  Component Value Date   HGBA1C 6.8 (H) 08/05/2018   BP Readings from Last 3 Encounters:  02/19/19 (!) 144/79  02/04/18 128/75  01/05/18 137/84   Lab Results  Component Value Date   CHOL 181 08/05/2018   HDL 52 08/05/2018   LDLCALC 111 (H) 08/05/2018   TRIG 91 08/05/2018   CHOLHDL 3.5 08/05/2018   The 10-year ASCVD risk score Denman George DC Jr., et al., 2013) is: 30.1%   Values used to calculate the score:     Age: 63 years     Sex: Male     Is Non-Hispanic African American: Yes     Diabetic: Yes     Tobacco smoker: No     Systolic Blood Pressure: 144 mmHg     Is BP treated: Yes     HDL Cholesterol: 52 mg/dL     Total Cholesterol: 181 mg/dL Wt Readings from Last 3 Encounters:  02/19/19 240 lb 3.2 oz (109 kg)  02/04/18 236 lb 12.8 oz (107.4 kg)  01/05/18 238 lb (108 kg)   Azucena Kuba 63 y.o.   Chief Complaint  Patient presents with  . Annual Exam  . Medication Refill    HISTORY OF PRESENT ILLNESS: This is a 63 y.o. male with history of diabetes, hypertension and dyslipidemia here for follow-up and medication refill. 1.  Hypertension: On Tenoretic 50-25 mg daily. 2.  Diabetes: On metformin 500 mg but taking it only once a day.  Advised to take it twice a day. 3.  Dyslipidemia: On pravastatin 20 mg daily. 4.  History of COPD on no medications. Has no complaints or medical concerns today.  Here for annual exam.  HPI   Prior to Admission medications   Medication Sig Start Date End Date Taking? Authorizing Provider  aspirin 81 MG tablet Take 81 mg by mouth daily.   Yes [provider]  atenolol-chlorthalidone (TENORETIC) 50-25 MG tablet TAKE 1 TABLET BY MOUTH DAILY 02/03/19 05/04/19 Yes Scheryl Sanborn, Eilleen Kempf, MD  BLACK CURRANT SEED OIL PO Take by mouth daily.   Yes [provider]  cetirizine (ZYRTEC) 10 MG tablet Take 1 tablet (10 mg total) by mouth daily. 05/10/17  Yes Linus Mako B, NP  cholecalciferol (VITAMIN D) 1000 units tablet  Take 1,000 Units by mouth daily.   Yes [provider]  lansoprazole (PREVACID) 15 MG capsule Take 15 mg by mouth as needed.    Yes [provider]  metFORMIN (GLUCOPHAGE) 500 MG tablet Take 1 tablet (500 mg total) by mouth 2 (two) times daily with a meal. 02/07/18  Yes Larina Lieurance, Eilleen Kempf, MD  Omega-3 Fatty Acids (FISH OIL) 1000 MG CAPS Take by mouth.   Yes [provider]  pravastatin (PRAVACHOL) 20 MG tablet Take 1 tablet (20 mg total) by mouth daily. 08/06/18  Yes Jeramey Lanuza, Eilleen Kempf, MD  mupirocin ointment (BACTROBAN) 2 % Apply 1 application topically 3 (three) times daily. Patient not taking: Reported on 02/19/2019 01/05/18   Bing Neighbors, FNP    Allergies  Allergen Reactions  . Ace Inhibitors Swelling    Suspected angioedema  . Penicillins Hives, Rash and Other (See Comments)    Throat swells  . Latex Hives, Swelling and Rash    Patient Active Problem List   Diagnosis Date Noted  . Type 2 diabetes mellitus with hyperglycemia, without long-term current use of insulin (HCC) 08/06/2018  . Dyslipidemia 08/06/2018  . COPD (chronic obstructive pulmonary disease) (HCC) 11/30/2012  . Hypertension  associated with type 2 diabetes mellitus (HCC) 07/18/2011    Past Medical History:  Diagnosis Date  . Allergy   . Anal fissure   . COPD (chronic obstructive pulmonary disease) (HCC)    Albuterol PRN.  Marland Kitchen. Hydrocele   . Hypertension   . IBS (irritable bowel syndrome) 05/22/13   per chart note-pt denied    Past Surgical History:  Procedure Laterality Date  . HERNIA REPAIR  prior to 2013  . HYDROCELE EXCISION Bilateral 11/23/2013   Procedure: HYDROCELECTOMY ADULT;  Surgeon: Valetta Fulleravid S Grapey, MD;  [facility] [place];  Service: Urology;  Laterality: Bilateral;  . VASECTOMY  prior to 2013    Social History   Socioeconomic History  . Marital status: Married    Spouse name: Not on file  . Number of children: Not on file  . Years of  education: Not on file  . Highest education level: Not on file  Occupational History  . Not on file  Social Needs  . Financial resource strain: Not on file  . Food insecurity    Worry: Not on file    Inability: Not on file  . Transportation needs    Medical: Not on file    Non-medical: Not on file  Tobacco Use  . Smoking status: Former Games developermoker  . Smokeless tobacco: Never Used  Substance and Sexual Activity  . Alcohol use: No  . Drug use: No  . Sexual activity: Never  Lifestyle  . Physical activity    Days per week: Not on file    Minutes per session: Not on file  . Stress: Not on file  Relationships  . Social Musicianconnections    Talks on phone: Not on file    Gets together: Not on file    Attends religious service: Not on file    Active member of club or organization: Not on file    Attends meetings of clubs or organizations: Not on file    Relationship status: Not on file  . Intimate partner violence    Fear of current or ex partner: Not on file    Emotionally abused: Not on file    Physically abused: Not on file    Forced sexual activity: Not on file  Other Topics Concern  . Not on file  Social History Narrative   Marital status: married      Employment: [employer]; drives all over.  Owns company.      Tobacco:  Quit smoking in 2005; smoked 25 years      Alcohol:  Rare beer      Drugs:  None      Exercise:  None   Daily caffeine     Family History  Problem Relation Age of Onset  . Diabetes Father   . Hypertension Father   . Diabetes Brother   . Hypertension Brother   . Diabetes Brother   . Diabetes Brother      Review of Systems  Constitutional: Negative.  Negative for chills and fever.       Weight gain  HENT: Negative.  Negative for congestion and sore throat.   Respiratory: Negative.  Negative for cough and shortness of breath.   Cardiovascular: Negative.  Negative for chest pain and palpitations.  Gastrointestinal: Negative.  Negative for  abdominal pain, diarrhea, nausea and vomiting.  Genitourinary: Negative.  Negative for hematuria.  Musculoskeletal: Positive for joint pain (Knees and hips). Negative for myalgias and neck pain.  Skin: Negative.  Negative for rash.  Neurological: Negative.  Negative for dizziness and headaches.  All other systems reviewed and are negative.  Vitals:   02/19/19 0910 02/19/19 1020  BP: (!) 144/79 132/76  Pulse: 69   Resp: 16   Temp: 98.6 F (37 C)   SpO2: 97%      Physical Exam Vitals signs reviewed.  Constitutional:      Appearance: Normal appearance.  HENT:     Head: Normocephalic.     Right Ear: Tympanic membrane, ear canal and external ear normal.     Left Ear: Tympanic membrane, ear canal and external ear normal.     Nose: Nose normal.     Mouth/Throat:     Mouth: Mucous membranes are moist.     Pharynx: Oropharynx is clear.  Eyes:     Extraocular Movements: Extraocular movements intact.     Conjunctiva/sclera: Conjunctivae normal.     Pupils: Pupils are equal, round, and reactive to light.  Neck:     Musculoskeletal: Normal range of motion and neck supple. No muscular tenderness.     Vascular: No carotid bruit.  Cardiovascular:     Rate and Rhythm: Normal rate and regular rhythm.     Pulses: Normal pulses.     Heart sounds: Normal heart sounds.  Pulmonary:     Effort: Pulmonary effort is normal.     Breath sounds: Normal breath sounds.  Abdominal:     General: Bowel sounds are normal. There is no distension.     Palpations: Abdomen is soft. There is no mass.     Tenderness: There is no abdominal tenderness.  Musculoskeletal: Normal range of motion.  Lymphadenopathy:     Cervical: No cervical adenopathy.  Skin:    General: Skin is warm and dry.     Capillary Refill: Capillary refill takes less than 2 seconds.  Neurological:     General: No focal deficit present.     Mental Status: He is alert and oriented to person, place, and time.     Sensory: No sensory  deficit.     Motor: No weakness.  Psychiatric:        Mood and Affect: Mood normal.        Behavior: Behavior normal.     Results for orders placed or performed in visit on 02/19/19 (from the past 24 hour(s))  POCT glucose (manual entry)     Status: Abnormal   Collection Time: 02/19/19 10:20 AM  Result Value Ref Range   POC Glucose 123 (A) 70 - 99 mg/dl  POCT glycosylated hemoglobin (Hb A1C)     Status: Abnormal   Collection Time: 02/19/19 10:26 AM  Result Value Ref Range   Hemoglobin A1C 6.7 (A) 4.0 - 5.6 %   HbA1c POC (<> result, manual entry)     HbA1c, POC (prediabetic range)     HbA1c, POC (controlled diabetic range)      ASSESSMENT & PLAN: Hypertension associated with type 2 diabetes mellitus (HCC) Blood pressure elevated but patient did not take medication this morning.  Continue Tenoretic, same dose. Hemoglobin A1c at 6.7.  Patient advised to take Metformin 500 mg twice a day as opposed to once a day only. Diet and nutrition discussed with patient at length. Continue pravastatin.  Lipid panel while fasting done today. Follow-up in 6 months.  Caelen was seen today for annual exam and medication refill.  Diagnoses and all orders for this visit:  Hypertension associated with type 2 diabetes mellitus (HCC) -  CBC with Differential  Type 2 diabetes mellitus with hyperglycemia, without long-term current use of insulin (HCC) -     POCT glucose (manual entry) -     POCT glycosylated hemoglobin (Hb A1C) -     HM Diabetes Foot Exam  Dyslipidemia associated with type 2 diabetes mellitus (HCC) -     Lipid panel  Essential hypertension -     atenolol-chlorthalidone (TENORETIC) 50-25 MG tablet; Take 1 tablet by mouth daily.  Encounter for general adult medical examination with abnormal findings    Patient Instructions       If you have lab work done today you will be contacted with your lab results within the next 2 weeks.  If you have not heard from [place] then  please contact us. The fastest way to get your results is to register for My Chart.   IF you received an x-ray today, you will receive an invoice from [facility]. Please contact [facility] at [phone] with questions or concerns regarding your invoice.   IF you received labwork today, you will receive an invoice from [facility]. Please contact [facility] at [phone] with questions or concerns regarding your invoice.   Our billing staff will not be able to assist you with questions regarding bills from these companies.  You will be contacted with the lab results as soon as they are available. The fastest way to get your results is to activate your My Chart account. Instructions are located on the last page of this paperwork. If you have not heard from [place] regarding the results in 2 weeks, please contact this office.      Health Maintenance, Male Adopting a healthy lifestyle and getting preventive care are important in promoting health and wellness. Ask your health care provider about:  The right schedule for you to have regular tests and exams.  Things you can do on your own to prevent diseases and keep yourself healthy. What should I know about diet, weight, and exercise? Eat a healthy diet   Eat a diet that includes plenty of vegetables, fruits, low-fat dairy products, and lean protein.  Do not eat a lot of foods that are high in solid fats, added sugars, or sodium. Maintain a healthy weight Body mass index (BMI) is a measurement that can be used to identify possible weight problems. It estimates body fat based on height and weight. Your health care provider can help determine your BMI and help you achieve or maintain a healthy weight. Get regular exercise Get regular exercise. This is one of the most important things you can do for your health. Most adults should:  Exercise for at least 150 minutes each week. The exercise should increase your heart rate and  make you sweat (moderate-intensity exercise).  Do strengthening exercises at least twice a week. This is in addition to the moderate-intensity exercise.  Spend less time sitting. Even light physical activity can be beneficial. Watch cholesterol and blood lipids Have your blood tested for lipids and cholesterol at 62 years of age, then have this test every 5 years. You may need to have your cholesterol levels checked more often if:  Your lipid or cholesterol levels are high.  You are older than 63 years of age.  You are at high risk for heart disease. What should I know about cancer screening? Many types of cancers can be detected early and may often be prevented. Depending on your health history and family history, you may need to have cancer  screening at various ages. This may include screening for:  Colorectal cancer.  Prostate cancer.  Skin cancer.  Lung cancer. What should I know about heart disease, diabetes, and high blood pressure? Blood pressure and heart disease  High blood pressure causes heart disease and increases the risk of stroke. This is more likely to develop in people who have high blood pressure readings, are of African descent, or are overweight.  Talk with your health care provider about your target blood pressure readings.  Have your blood pressure checked: ? Every 3-5 years if you are 7-14 years of age. ? Every year if you are 43 years old or older.  If you are between the ages of 33 and 41 and are a current or former smoker, ask your health care provider if you should have a one-time screening for abdominal aortic aneurysm (AAA). Diabetes Have regular diabetes screenings. This checks your fasting blood sugar level. Have the screening done:  Once every three years after age 85 if you are at a normal weight and have a low risk for diabetes.  More often and at a younger age if you are overweight or have a high risk for diabetes. What should I know about  preventing infection? Hepatitis B If you have a higher risk for hepatitis B, you should be screened for this virus. Talk with your health care provider to find out if you are at risk for hepatitis B infection. Hepatitis C Blood testing is recommended for:  Everyone born from [birth year] through 1964.  Anyone with known risk factors for hepatitis C. Sexually transmitted infections (STIs)  You should be screened each year for STIs, including gonorrhea and chlamydia, if: ? You are sexually active and are younger than 62 years of age. ? You are older than 62 years of age and your health care provider tells you that you are at risk for this type of infection. ? Your sexual activity has changed since you were last screened, and you are at increased risk for chlamydia or gonorrhea. Ask your health care provider if you are at risk.  Ask your health care provider about whether you are at high risk for HIV. Your health care provider may recommend a prescription medicine to help prevent HIV infection. If you choose to take medicine to prevent HIV, you should first get tested for HIV. You should then be tested every 3 months for as long as you are taking the medicine. Follow these instructions at home: Lifestyle  Do not use any products that contain nicotine or tobacco, such as cigarettes, e-cigarettes, and chewing tobacco. If you need help quitting, ask your health care provider.  Do not use street drugs.  Do not share needles.  Ask your health care provider for help if you need support or information about quitting drugs. Alcohol use  Do not drink alcohol if your health care provider tells you not to drink.  If you drink alcohol: ? Limit how much you have to 0-2 drinks a day. ? Be aware of how much alcohol is in your drink. In the [place] one drink equals one 12 oz bottle of beer (355 mL), one 5 oz glass of wine (148 mL), or one 1 oz glass of hard liquor (44 mL). General instructions  Schedule  regular health, dental, and eye exams.  Stay current with your vaccines.  Tell your health care provider if: ? You often feel depressed. ? You have ever been abused or do not feel safe  at home. Summary  Adopting a healthy lifestyle and getting preventive care are important in promoting health and wellness.  Follow your health care provider's instructions about healthy diet, exercising, and getting tested or screened for diseases.  Follow your health care provider's instructions on monitoring your cholesterol and blood pressure. This information is not intended to replace advice given to you by your health care provider. Make sure you discuss any questions you have with your health care provider. Document Released: 09/22/2007 Document Revised: 03/19/2018 Document Reviewed: 03/19/2018 Elsevier Patient Education  2020 [employer].  Diabetes Mellitus and Nutrition, Adult When you have diabetes (diabetes mellitus), it is very important to have healthy eating habits because your blood sugar (glucose) levels are greatly affected by what you eat and drink. Eating healthy foods in the appropriate amounts, at about the same times every day, can help you:  Control your blood glucose.  Lower your risk of heart disease.  Improve your blood pressure.  Reach or maintain a healthy weight. Every person with diabetes is different, and each person has different needs for a meal plan. Your health care provider may recommend that you work with a diet and nutrition specialist (dietitian) to make a meal plan that is best for you. Your meal plan may vary depending on factors such as:  The calories you need.  The medicines you take.  Your weight.  Your blood glucose, blood pressure, and cholesterol levels.  Your activity level.  Other health conditions you have, such as heart or kidney disease. How do carbohydrates affect me? Carbohydrates, also called carbs, affect your blood glucose level more  than any other type of food. Eating carbs naturally raises the amount of glucose in your blood. Carb counting is a method for keeping track of how many carbs you eat. Counting carbs is important to keep your blood glucose at a healthy level, especially if you use insulin or take certain oral diabetes medicines. It is important to know how many carbs you can safely have in each meal. This is different for every person. Your dietitian can help you calculate how many carbs you should have at each meal and for each snack. Foods that contain carbs include:  Bread, cereal, rice, pasta, and crackers.  Potatoes and corn.  Peas, beans, and lentils.  Milk and yogurt.  Fruit and juice.  Desserts, such as cakes, cookies, ice cream, and candy. How does alcohol affect me? Alcohol can cause a sudden decrease in blood glucose (hypoglycemia), especially if you use insulin or take certain oral diabetes medicines. Hypoglycemia can be a life-threatening condition. Symptoms of hypoglycemia (sleepiness, dizziness, and confusion) are similar to symptoms of having too much alcohol. If your health care provider says that alcohol is safe for you, follow these guidelines:  Limit alcohol intake to no more than 1 drink per day for nonpregnant women and 2 drinks per day for men. One drink equals 12 oz of beer, 5 oz of wine, or 1 oz of hard liquor.  Do not drink on an empty stomach.  Keep yourself hydrated with water, diet soda, or unsweetened iced tea.  Keep in mind that regular soda, juice, and other mixers may contain a lot of sugar and must be counted as carbs. What are tips for following this plan?  Reading food labels  Start by checking the serving size on the "Nutrition Facts" label of packaged foods and drinks. The amount of calories, carbs, fats, and other nutrients listed on the label is  based on one serving of the item. Many items contain more than one serving per package.  Check the total grams (g) of  carbs in one serving. You can calculate the number of servings of carbs in one serving by dividing the total carbs by 15. For example, if a food has 30 g of total carbs, it would be equal to 2 servings of carbs.  Check the number of grams (g) of saturated and trans fats in one serving. Choose foods that have low or no amount of these fats.  Check the number of milligrams (mg) of salt (sodium) in one serving. Most people should limit total sodium intake to less than 2,300 mg per day.  Always check the nutrition information of foods labeled as "low-fat" or "nonfat". These foods may be higher in added sugar or refined carbs and should be avoided.  Talk to your dietitian to identify your daily goals for nutrients listed on the label. Shopping  Avoid buying canned, premade, or processed foods. These foods tend to be high in fat, sodium, and added sugar.  Shop around the outside edge of the grocery store. This includes fresh fruits and vegetables, bulk grains, fresh meats, and fresh dairy. Cooking  Use low-heat cooking methods, such as baking, instead of high-heat cooking methods like deep frying.  Cook using healthy oils, such as olive, canola, or sunflower oil.  Avoid cooking with butter, cream, or high-fat meats. Meal planning  Eat meals and snacks regularly, preferably at the same times every day. Avoid going long periods of time without eating.  Eat foods high in fiber, such as fresh fruits, vegetables, beans, and whole grains. Talk to your dietitian about how many servings of carbs you can eat at each meal.  Eat 4-6 ounces (oz) of lean protein each day, such as lean meat, chicken, fish, eggs, or tofu. One oz of lean protein is equal to: ? 1 oz of meat, chicken, or fish. ? 1 egg. ?  cup of tofu.  Eat some foods each day that contain healthy fats, such as avocado, nuts, seeds, and fish. Lifestyle  Check your blood glucose regularly.  Exercise regularly as told by your health care  provider. This may include: ? 150 minutes of moderate-intensity or vigorous-intensity exercise each week. This could be brisk walking, biking, or water aerobics. ? Stretching and doing strength exercises, such as yoga or weightlifting, at least 2 times a week.  Take medicines as told by your health care provider.  Do not use any products that contain nicotine or tobacco, such as cigarettes and e-cigarettes. If you need help quitting, ask your health care provider.  Work with a Veterinary surgeon or diabetes educator to identify strategies to manage stress and any emotional and social challenges. Questions to ask a health care provider  Do I need to meet with a diabetes educator?  Do I need to meet with a dietitian?  What number can I call if I have questions?  When are the best times to check my blood glucose? Where to find more information:  American Diabetes Association: diabetes.org  Academy of Nutrition and Dietetics: www.eatright.[employer] of Diabetes and Digestive and Kidney Diseases (NIH): CarFlippers.tn Summary  A healthy meal plan will help you control your blood glucose and maintain a healthy lifestyle.  Working with a diet and nutrition specialist (dietitian) can help you make a meal plan that is best for you.  Keep in mind that carbohydrates (carbs) and alcohol have immediate effects  on your blood glucose levels. It is important to count carbs and to use alcohol carefully. This information is not intended to replace advice given to you by your health care provider. Make sure you discuss any questions you have with your health care provider. Document Released: 12/21/2004 Document Revised: 03/08/2017 Document Reviewed: 04/30/2016 Elsevier Patient Education  2020 [employer].  Hypertension, Adult High blood pressure (hypertension) is when the force of blood pumping through the arteries is too strong. The arteries are the blood vessels that carry blood from the  heart throughout the body. Hypertension forces the heart to work harder to pump blood and may cause arteries to become narrow or stiff. Untreated or uncontrolled hypertension can cause a heart attack, heart failure, a stroke, kidney disease, and other problems. A blood pressure reading consists of a higher number over a lower number. Ideally, your blood pressure should be below 120/80. The first ("top") number is called the systolic pressure. It is a measure of the pressure in your arteries as your heart beats. The second ("bottom") number is called the diastolic pressure. It is a measure of the pressure in your arteries as the heart relaxes. What are the causes? The exact cause of this condition is not known. There are some conditions that result in or are related to high blood pressure. What increases the risk? Some risk factors for high blood pressure are under your control. The following factors may make you more likely to develop this condition:  Smoking.  Having type 2 diabetes mellitus, high cholesterol, or both.  Not getting enough exercise or physical activity.  Being overweight.  Having too much fat, sugar, calories, or salt (sodium) in your diet.  Drinking too much alcohol. Some risk factors for high blood pressure may be difficult or impossible to change. Some of these factors include:  Having chronic kidney disease.  Having a family history of high blood pressure.  Age. Risk increases with age.  Race. You may be at higher risk if you are African American.  Gender. Men are at higher risk than women before age 24. After age 48, women are at higher risk than men.  Having obstructive sleep apnea.  Stress. What are the signs or symptoms? High blood pressure may not cause symptoms. Very high blood pressure (hypertensive crisis) may cause:  Headache.  Anxiety.  Shortness of breath.  Nosebleed.  Nausea and vomiting.  Vision changes.  Severe chest pain.   Seizures. How is this diagnosed? This condition is diagnosed by measuring your blood pressure while you are seated, with your arm resting on a flat surface, your legs uncrossed, and your feet flat on the floor. The cuff of the blood pressure monitor will be placed directly against the skin of your upper arm at the level of your heart. It should be measured at least twice using the same arm. Certain conditions can cause a difference in blood pressure between your right and left arms. Certain factors can cause blood pressure readings to be lower or higher than normal for a short period of time:  When your blood pressure is higher when you are in a health care provider's office than when you are at home, this is called white coat hypertension. Most people with this condition do not need medicines.  When your blood pressure is higher at home than when you are in a health care provider's office, this is called masked hypertension. Most people with this condition may need medicines to control blood pressure.  If you have a high blood pressure reading during one visit or you have normal blood pressure with other risk factors, you may be asked to:  Return on a different day to have your blood pressure checked again.  Monitor your blood pressure at home for 1 week or longer. If you are diagnosed with hypertension, you may have other blood or imaging tests to help your health care provider understand your overall risk for other conditions. How is this treated? This condition is treated by making healthy lifestyle changes, such as eating healthy foods, exercising more, and reducing your alcohol intake. Your health care provider may prescribe medicine if lifestyle changes are not enough to get your blood pressure under control, and if:  Your systolic blood pressure is above 130.  Your diastolic blood pressure is above 80. Your personal target blood pressure may vary depending on your medical conditions, your  age, and other factors. Follow these instructions at home: Eating and drinking   Eat a diet that is high in fiber and potassium, and low in sodium, added sugar, and fat. An example eating plan is called the DASH (Dietary Approaches to Stop Hypertension) diet. To eat this way: ? Eat plenty of fresh fruits and vegetables. Try to fill one half of your plate at each meal with fruits and vegetables. ? Eat whole grains, such as whole-wheat pasta, brown rice, or whole-grain bread. Fill about one fourth of your plate with whole grains. ? Eat or drink low-fat dairy products, such as skim milk or low-fat yogurt. ? Avoid fatty cuts of meat, processed or cured meats, and poultry with skin. Fill about one fourth of your plate with lean proteins, such as fish, chicken without skin, beans, eggs, or tofu. ? Avoid pre-made and processed foods. These tend to be higher in sodium, added sugar, and fat.  Reduce your daily sodium intake. Most people with hypertension should eat less than 1,500 mg of sodium a day.  Do not drink alcohol if: ? Your health care provider tells you not to drink. ? You are pregnant, may be pregnant, or are planning to become pregnant.  If you drink alcohol: ? Limit how much you use to:  0-1 drink a day for women.  0-2 drinks a day for men. ? Be aware of how much alcohol is in your drink. In the [place] one drink equals one 12 oz bottle of beer (355 mL), one 5 oz glass of wine (148 mL), or one 1 oz glass of hard liquor (44 mL). Lifestyle   Work with your health care provider to maintain a healthy body weight or to lose weight. Ask what an ideal weight is for you.  Get at least 30 minutes of exercise most days of the week. Activities may include walking, swimming, or biking.  Include exercise to strengthen your muscles (resistance exercise), such as Pilates or lifting weights, as part of your weekly exercise routine. Try to do these types of exercises for 30 minutes at least 3 days  a week.  Do not use any products that contain nicotine or tobacco, such as cigarettes, e-cigarettes, and chewing tobacco. If you need help quitting, ask your health care provider.  Monitor your blood pressure at home as told by your health care provider.  Keep all follow-up visits as told by your health care provider. This is important. Medicines  Take over-the-counter and prescription medicines only as told by your health care provider. Follow directions carefully. Blood pressure medicines must be  taken as prescribed.  Do not skip doses of blood pressure medicine. Doing this puts you at risk for problems and can make the medicine less effective.  Ask your health care provider about side effects or reactions to medicines that you should watch for. Contact a health care provider if you:  Think you are having a reaction to a medicine you are taking.  Have headaches that keep coming back (recurring).  Feel dizzy.  Have swelling in your ankles.  Have trouble with your vision. Get help right away if you:  Develop a severe headache or confusion.  Have unusual weakness or numbness.  Feel faint.  Have severe pain in your chest or abdomen.  Vomit repeatedly.  Have trouble breathing. Summary  Hypertension is when the force of blood pumping through your arteries is too strong. If this condition is not controlled, it may put you at risk for serious complications.  Your personal target blood pressure may vary depending on your medical conditions, your age, and other factors. For most people, a normal blood pressure is less than 120/80.  Hypertension is treated with lifestyle changes, medicines, or a combination of both. Lifestyle changes include losing weight, eating a healthy, low-sodium diet, exercising more, and limiting alcohol. This information is not intended to replace advice given to you by your health care provider. Make sure you discuss any questions you have with your  health care provider. Document Released: 03/26/2005 Document Revised: 12/04/2017 Document Reviewed: 12/04/2017 Elsevier Patient Education  2020 Elsevier Inc.      Edwina Barth, MD [facility]

## 2019-02-20 ENCOUNTER — Encounter: Payer: Self-pay | Admitting: Emergency Medicine

## 2019-02-20 LAB — CBC WITH DIFFERENTIAL/PLATELET
Basophils Absolute: 0.1 10*3/uL (ref 0.0–0.2)
Basos: 2 %
EOS (ABSOLUTE): 0.1 10*3/uL (ref 0.0–0.4)
Eos: 1 %
Hematocrit: 44.6 % (ref 37.5–51.0)
Hemoglobin: 15 g/dL (ref 13.0–17.7)
Immature Grans (Abs): 0 10*3/uL (ref 0.0–0.1)
Immature Granulocytes: 0 %
Lymphocytes Absolute: 2.3 10*3/uL (ref 0.7–3.1)
Lymphs: 37 %
MCH: 28.1 pg (ref 26.6–33.0)
MCHC: 33.6 g/dL (ref 31.5–35.7)
MCV: 84 fL (ref 79–97)
Monocytes Absolute: 0.6 10*3/uL (ref 0.1–0.9)
Monocytes: 10 %
Neutrophils Absolute: 3.1 10*3/uL (ref 1.4–7.0)
Neutrophils: 50 %
Platelets: 261 10*3/uL (ref 150–450)
RBC: 5.33 x10E6/uL (ref 4.14–5.80)
RDW: 12.9 % (ref 11.6–15.4)
WBC: 6.2 10*3/uL (ref 3.4–10.8)

## 2019-02-20 LAB — LIPID PANEL
Chol/HDL Ratio: 3.5 ratio (ref 0.0–5.0)
Cholesterol, Total: 188 mg/dL (ref 100–199)
HDL: 53 mg/dL
LDL Chol Calc (NIH): 121 mg/dL — ABNORMAL HIGH (ref 0–99)
Triglycerides: 76 mg/dL (ref 0–149)
VLDL Cholesterol Cal: 14 mg/dL (ref 5–40)

## 2019-03-10 ENCOUNTER — Other Ambulatory Visit: Payer: Self-pay

## 2019-03-10 DIAGNOSIS — Z20822 Contact with and (suspected) exposure to covid-19: Secondary | ICD-10-CM

## 2019-03-11 ENCOUNTER — Telehealth: Payer: Self-pay | Admitting: *Deleted

## 2019-03-11 NOTE — Telephone Encounter (Signed)
Patient called for results ,still pending advised to call back. 

## 2019-03-12 LAB — NOVEL CORONAVIRUS, NAA: SARS-CoV-2, NAA: DETECTED — AB

## 2019-03-13 ENCOUNTER — Other Ambulatory Visit: Payer: Self-pay | Admitting: Emergency Medicine

## 2019-03-13 DIAGNOSIS — E119 Type 2 diabetes mellitus without complications: Secondary | ICD-10-CM

## 2019-03-13 NOTE — Telephone Encounter (Signed)
Forwarding medication refill request to the clinical pool for review. 

## 2019-03-14 ENCOUNTER — Telehealth: Payer: Self-pay | Admitting: Unknown Physician Specialty

## 2019-03-14 NOTE — Telephone Encounter (Signed)
Discussed with patient about Covid symptoms and the use of bamlanivimab, a monoclonal antibody infusion for those with mild to moderate Covid symptoms and at a high risk of hospitalization.  Pt is qualified for this infusion at the [facility] infusion center due to diabetes which were addressed with the patient and are actively being managed by a [facility] provider. He does not qualify due to asymptomatic at this time

## 2019-03-16 ENCOUNTER — Ambulatory Visit: Payer: Self-pay

## 2019-03-16 NOTE — Telephone Encounter (Signed)
Patient called wondering how long he need to quarantine after a positive test result.  Patient states he still feels tired, has no appetite,and has some tightness to his breathing. He denies cough.  States he has COPD but has not needed an inhaler for COPD. Criteria for ending isolation were read to patient. He was informed that as long as his breathing was not at his norm he should continue quarantine. He verbalized understanding and will continue quarantine for at least a complete 10 days. He will re evaluate his respiratory symptoms and remain longer if he has not reached baseline or had real improvement. Patient denied need for evaluation by PCP.  Reason for Disposition . Health Information question, no triage required and triager able to answer question  Answer Assessment - Initial Assessment Questions 1. REASON FOR CALL or QUESTION: "What is your reason for calling today?" or "How can I best help you?" or "What question do you have that I can help answer?"     When do I return to work after COVID-19 positive test. Patient giving symptoms of weakness no appetite,  Protocols used: INFORMATION ONLY CALL - NO TRIAGE-A-AH

## 2019-05-25 ENCOUNTER — Other Ambulatory Visit: Payer: Self-pay | Admitting: Emergency Medicine

## 2019-05-25 DIAGNOSIS — E119 Type 2 diabetes mellitus without complications: Secondary | ICD-10-CM

## 2019-05-25 DIAGNOSIS — I10 Essential (primary) hypertension: Secondary | ICD-10-CM

## 2019-05-25 MED ORDER — ATENOLOL-CHLORTHALIDONE 50-25 MG PO TABS
1.0000 | ORAL_TABLET | Freq: Every day | ORAL | 0 refills | Status: DC
Start: 2019-05-25 — End: 2019-08-26

## 2019-05-25 MED ORDER — METFORMIN HCL 500 MG PO TABS: ORAL_TABLET | ORAL | 0 refills | Status: DC

## 2019-05-25 NOTE — Telephone Encounter (Signed)
metFORMIN (GLUCOPHAGE) 500 MG tablet  atenolol-chlorthalidone (TENORETIC) 50-25 MG tablet [[id number]]   Patient requesting refills.    [id number] [place] Otto, [place] - [phone] AT [facility] & [address]  [address] [place] [address]  Phone:  [phone] Fax:  [phone]

## 2019-05-25 NOTE — Telephone Encounter (Signed)
Spoke with pharmacy, no refills

## 2019-07-20 ENCOUNTER — Telehealth: Payer: Self-pay | Admitting: Emergency Medicine

## 2019-07-20 NOTE — Telephone Encounter (Signed)
Pt called back and resch appt on 5/18

## 2019-07-20 NOTE — Telephone Encounter (Signed)
Called patient to reschedule the 6 month follow up with pcp. I did leave a voicemail

## 2019-08-16 ENCOUNTER — Other Ambulatory Visit: Payer: Self-pay | Admitting: Emergency Medicine

## 2019-08-16 DIAGNOSIS — E785 Hyperlipidemia, unspecified: Secondary | ICD-10-CM

## 2019-08-16 NOTE — Telephone Encounter (Signed)
Requested Prescriptions  Pending Prescriptions Disp Refills  . pravastatin (PRAVACHOL) 20 MG tablet [Pharmacy Med Name: PRAVASTATIN 20MG  TABLETS] 90 tablet 1    Sig: TAKE 1 TABLET(20 MG) BY MOUTH DAILY     Cardiovascular:  Antilipid - Statins Failed - 08/16/2019  4:03 AM      Failed - LDL in normal range and within 360 days    LDL Chol Calc (NIH)  Date Value Ref Range Status  02/19/2019 121 (H) 0 - 99 mg/dL Final         Passed - Total Cholesterol in normal range and within 360 days    Cholesterol, Total  Date Value Ref Range Status  02/19/2019 188 100 - 199 mg/dL Final         Passed - HDL in normal range and within 360 days    HDL  Date Value Ref Range Status  02/19/2019 53 >39 mg/dL Final         Passed - Triglycerides in normal range and within 360 days    Triglycerides  Date Value Ref Range Status  02/19/2019 76 0 - 149 mg/dL Final         Passed - Patient is not pregnant      Passed - Valid encounter within last 12 months    Recent Outpatient Visits          5 months ago Hypertension associated with type 2 diabetes mellitus (HCC)   [facility] at [place], [place], MD   1 year ago Hypertension associated with type 2 diabetes mellitus [facility])   [facility] at [facility], [facility], MD   1 year ago Essential hypertension   [facility] [place], [place], MD   2 years ago Essential hypertension   [facility], Otho Bellows, PA-C   3 years ago Essential hypertension   [facility], Otho Bellows, PA-C      Future Appointments            In 1 week Sagardia, Marolyn Hammock, MD [facility] at [place], [facility]

## 2019-08-20 ENCOUNTER — Ambulatory Visit: Payer: Self-pay | Admitting: Emergency Medicine

## 2019-08-21 ENCOUNTER — Other Ambulatory Visit: Payer: Self-pay | Admitting: Emergency Medicine

## 2019-08-21 DIAGNOSIS — E119 Type 2 diabetes mellitus without complications: Secondary | ICD-10-CM

## 2019-08-21 NOTE — Telephone Encounter (Signed)
Requested medications are due for refill today?  Yes  Requested medications are on active medication list?  Yes  Last Refill:   05/15/2019  # 180 with no refills   Future visit scheduled?  Yes in 4 days.    Notes to Clinic:  Medication failed RX refill protocol due no labs within established time frame and no valid encounter in past 180 days.

## 2019-08-25 ENCOUNTER — Ambulatory Visit: Payer: Self-pay | Admitting: Emergency Medicine

## 2019-08-26 ENCOUNTER — Ambulatory Visit (INDEPENDENT_AMBULATORY_CARE_PROVIDER_SITE_OTHER): Payer: Self-pay | Admitting: Emergency Medicine

## 2019-08-26 ENCOUNTER — Encounter: Payer: Self-pay | Admitting: Emergency Medicine

## 2019-08-26 ENCOUNTER — Other Ambulatory Visit: Payer: Self-pay

## 2019-08-26 VITALS — BP 150/80 | HR 58 | Temp 98.2°F | Ht 68.0 in | Wt 242.0 lb

## 2019-08-26 DIAGNOSIS — E785 Hyperlipidemia, unspecified: Secondary | ICD-10-CM

## 2019-08-26 DIAGNOSIS — E1159 Type 2 diabetes mellitus with other circulatory complications: Secondary | ICD-10-CM

## 2019-08-26 DIAGNOSIS — E1169 Type 2 diabetes mellitus with other specified complication: Secondary | ICD-10-CM

## 2019-08-26 DIAGNOSIS — E1165 Type 2 diabetes mellitus with hyperglycemia: Secondary | ICD-10-CM

## 2019-08-26 DIAGNOSIS — I10 Essential (primary) hypertension: Secondary | ICD-10-CM

## 2019-08-26 LAB — GLUCOSE, POCT (MANUAL RESULT ENTRY): POC Glucose: 115 mg/dl — AB (ref 70–99)

## 2019-08-26 LAB — POCT GLYCOSYLATED HEMOGLOBIN (HGB A1C): Hemoglobin A1C: 7.1 % — AB (ref 4.0–5.6)

## 2019-08-26 MED ORDER — ATENOLOL-CHLORTHALIDONE 50-25 MG PO TABS: 1.0000 | ORAL_TABLET | Freq: Every day | ORAL | 0 refills | Status: DC

## 2019-08-26 MED ORDER — METFORMIN HCL 500 MG PO TABS: 500.0000 mg | ORAL_TABLET | Freq: Two times a day (BID) | ORAL | 3 refills | Status: DC

## 2019-08-26 MED ORDER — ROSUVASTATIN CALCIUM 20 MG PO TABS: 20.0000 mg | ORAL_TABLET | Freq: Every day | ORAL | 3 refills | Status: DC

## 2019-08-26 NOTE — Progress Notes (Signed)
Larry Dawson 64 y.o.   Chief Complaint  Patient presents with  . Medical Management of Chronic Issues    6 m f/u   . Hypertension  . Diabetes  . Hyperlipidemia  . left ear hear issues    HISTORY OF PRESENT ILLNESS: This is a 64 y.o. male with history of diabetes, hypertension, dyslipidemia here for follow-up. Feels good.  Has no complaints or medical concerns today 1.  Diabetes: On Metformin 500 mg twice a day Lab Results  Component Value Date   HGBA1C 6.7 (A) 02/19/2019    2.  Hypertension: On Tenoretic 50-25 mg daily  BP Readings from Last 3 Encounters:  08/26/19 (!) 150/80  02/19/19 132/76  02/04/18 128/75    3.  Dyslipidemia: On pravastatin 20 mg daily Lab Results  Component Value Date   CHOL 188 02/19/2019   HDL 53 02/19/2019   LDLCALC 121 (H) 02/19/2019   TRIG 76 02/19/2019   CHOLHDL 3.5 02/19/2019   The 10-year ASCVD risk score Mikey Bussing DC Jr., et al., 2013) is: 32.2%   Values used to calculate the score:     Age: 25 years     Sex: Male     Is Non-Hispanic African American: Yes     Diabetic: Yes     Tobacco smoker: No     Systolic Blood Pressure: 403 mmHg     Is BP treated: Yes     HDL Cholesterol: 53 mg/dL     Total Cholesterol: 188 mg/dL   HPI   Prior to Admission medications   Medication Sig Start Date End Date Taking? Authorizing Provider  aspirin 81 MG tablet Take 81 mg by mouth daily.   Yes [provider]  atenolol-chlorthalidone (TENORETIC) 50-25 MG tablet Take 1 tablet by mouth daily. 05/25/19 08/26/19 Yes Sagardia, Ines Bloomer, MD  cholecalciferol (VITAMIN D) 1000 units tablet Take 1,000 Units by mouth daily.   Yes [provider]  metFORMIN (GLUCOPHAGE) 500 MG tablet TAKE 1 TABLET(500 MG) BY MOUTH TWICE DAILY WITH A MEAL 08/21/19  Yes Sagardia, Ines Bloomer, MD  Omega-3 Fatty Acids (FISH OIL) 1000 MG CAPS Take by mouth.   Yes [provider]  omeprazole (PRILOSEC) 20 MG capsule Take 20 mg by mouth daily.   Yes  [provider]  cetirizine (ZYRTEC) 10 MG tablet Take 1 tablet (10 mg total) by mouth daily. Patient not taking: Reported on 08/26/2019 05/10/17   Augusto Gamble B, NP  pravastatin (PRAVACHOL) 20 MG tablet TAKE 1 TABLET(20 MG) BY MOUTH DAILY Patient not taking: Reported on 08/26/2019 08/16/19   Horald Pollen, MD    Allergies  Allergen Reactions  . Ace Inhibitors Swelling    Suspected angioedema  . Penicillins Hives, Rash and Other (See Comments)    Throat swells  . Latex Hives, Swelling and Rash    Patient Active Problem List   Diagnosis Date Noted  . Type 2 diabetes mellitus with hyperglycemia, without long-term current use of insulin ([place]) 08/06/2018  . Dyslipidemia 08/06/2018  . COPD (chronic obstructive pulmonary disease) ([place]) 11/30/2012  . Hypertension associated with type 2 diabetes mellitus ([place]) 07/18/2011    Past Medical History:  Diagnosis Date  . Allergy   . Anal fissure   . COPD (chronic obstructive pulmonary disease) ([facility])    Albuterol PRN.  Marland Kitchen Hydrocele   . Hypertension   . IBS (irritable bowel syndrome) 05/22/13   per chart note-pt denied    Past Surgical History:  Procedure Laterality Date  .  HERNIA REPAIR  prior to 2013  . HYDROCELE EXCISION Bilateral 11/23/2013   Procedure: HYDROCELECTOMY ADULT;  Surgeon: Bernestine Amass, MD;  Location: [facility];  Service: Urology;  Laterality: Bilateral;  . VASECTOMY  prior to 2013    Social History   Socioeconomic History  . Marital status: Married    Spouse name: Not on file  . Number of children: Not on file  . Years of education: Not on file  . Highest education level: Not on file  Occupational History  . Not on file  Tobacco Use  . Smoking status: Former Research scientist (life sciences)  . Smokeless tobacco: Never Used  Substance and Sexual Activity  . Alcohol use: No  . Drug use: No  . Sexual activity: Never  Other Topics Concern  . Not on file  Social History Narrative   Marital status:  married      Employment: [employer]; drives all over.  Owns company.      Tobacco:  Quit smoking in 2005; smoked 25 years      Alcohol:  Rare beer      Drugs:  None      Exercise:  None   Daily caffeine    Social Determinants of Health   Financial Resource Strain:   . Difficulty of Paying Living Expenses:   Food Insecurity:   . Worried About Charity fundraiser in the Last Year:   . Arboriculturist in the Last Year:   Transportation Needs:   . Film/video editor (Medical):   Marland Kitchen Lack of Transportation (Non-Medical):   Physical Activity:   . Days of Exercise per Week:   . Minutes of Exercise per Session:   Stress:   . Feeling of Stress :   Social Connections:   . Frequency of Communication with Friends and Family:   . Frequency of Social Gatherings with Friends and Family:   . Attends Religious Services:   . Active Member of Clubs or Organizations:   . Attends Archivist Meetings:   Marland Kitchen Marital Status:   Intimate Partner Violence:   . Fear of Current or Ex-Partner:   . Emotionally Abused:   Marland Kitchen Physically Abused:   . Sexually Abused:     Family History  Problem Relation Age of Onset  . Diabetes Father   . Hypertension Father   . Diabetes Brother   . Hypertension Brother   . Diabetes Brother   . Diabetes Brother      Review of Systems  Constitutional: Negative.  Negative for chills and fever.  HENT: Negative.  Negative for congestion and sore throat.   Respiratory: Negative.  Negative for cough and shortness of breath.   Cardiovascular: Negative.  Negative for chest pain and palpitations.  Gastrointestinal: Negative.  Negative for abdominal pain, blood in stool, diarrhea, melena, nausea and vomiting.  Genitourinary: Negative.  Negative for dysuria and hematuria.  Musculoskeletal: Positive for back pain (Occasional, patient is a truck driver). Negative for myalgias and neck pain.  Skin: Negative.  Negative for rash.  Neurological: Negative for  dizziness and headaches.  All other systems reviewed and are negative.  Vitals:   08/26/19 0824 08/26/19 0836  BP: (!) 150/81 (!) 150/80  Pulse: (!) 58   Temp: 98.2 F (36.8 C)   SpO2: 99%      Physical Exam Vitals reviewed.  Constitutional:      Appearance: Normal appearance.  HENT:     Head: Normocephalic.  Eyes:  Extraocular Movements: Extraocular movements intact.     Pupils: Pupils are equal, round, and reactive to light.  Cardiovascular:     Rate and Rhythm: Normal rate and regular rhythm.     Pulses: Normal pulses.     Heart sounds: Normal heart sounds.  Pulmonary:     Effort: Pulmonary effort is normal.     Breath sounds: Normal breath sounds.  Musculoskeletal:        General: Normal range of motion.     Cervical back: Normal range of motion.  Skin:    General: Skin is warm and dry.     Capillary Refill: Capillary refill takes less than 2 seconds.  Neurological:     General: No focal deficit present.     Mental Status: He is alert and oriented to person, place, and time.  Psychiatric:        Mood and Affect: Mood normal.        Behavior: Behavior normal.    Results for orders placed or performed in visit on 08/26/19 (from the past 24 hour(s))  POCT glycosylated hemoglobin (Hb A1C)     Status: Abnormal   Collection Time: 08/26/19  9:21 AM  Result Value Ref Range   Hemoglobin A1C 7.1 (A) 4.0 - 5.6 %   HbA1c POC (<> result, manual entry)     HbA1c, POC (prediabetic range)     HbA1c, POC (controlled diabetic range)    POCT glucose (manual entry)     Status: Abnormal   Collection Time: 08/26/19  9:21 AM  Result Value Ref Range   POC Glucose 115 (A) 70 - 99 mg/dl     ASSESSMENT & PLAN: Hypertension associated with type 2 diabetes mellitus (HCC) Systolic blood pressure a little high.  Continue present medication.  No changes.  Advised to monitor blood pressure readings at home.  Will make changes accordingly. Uncontrolled diabetes with hemoglobin A1c  higher than before at 7.1.  Has not been very compliant with Metformin or diet.  Continue Metformin 500 mg twice a day.  Diet and nutrition discussed.  Weight loss encouraged. Follow-up in 3 months. Reyn was seen today for medical management of chronic issues, hypertension, diabetes, hyperlipidemia and left ear hear issues.  Diagnoses and all orders for this visit:  Hypertension associated with type 2 diabetes mellitus ([place]) -     POCT glycosylated hemoglobin (Hb A1C) -     POCT glucose (manual entry) -     CMP14+EGFR -     Ambulatory referral to Ophthalmology -     metFORMIN (GLUCOPHAGE) 500 MG tablet; Take 1 tablet (500 mg total) by mouth 2 (two) times daily with a meal.  Dyslipidemia associated with type 2 diabetes mellitus (HCC) -     Lipid panel -     rosuvastatin (CRESTOR) 20 MG tablet; Take 1 tablet (20 mg total) by mouth daily.  Essential hypertension -     atenolol-chlorthalidone (TENORETIC) 50-25 MG tablet; Take 1 tablet by mouth daily.  Dyslipidemia  Type 2 diabetes mellitus with hyperglycemia, without long-term current use of insulin [facility])    Patient Instructions       If you have lab work done today you will be contacted with your lab results within the next 2 weeks.  If you have not heard from [place] then please contact us. The fastest way to get your results is to register for My Chart.   IF you received an x-ray today, you will receive an invoice from [facility]  Radiology. Please contact [facility] Radiology at [phone] with questions or concerns regarding your invoice.   IF you received labwork today, you will receive an invoice from [place]. Please contact [facility] at [phone] with questions or concerns regarding your invoice.   Our billing staff will not be able to assist you with questions regarding bills from these companies.  You will be contacted with the lab results as soon as they are available. The fastest way to get your results is to activate  your My Chart account. Instructions are located on the last page of this paperwork. If you have not heard from [place] regarding the results in 2 weeks, please contact this office.      Diabetes Mellitus and Nutrition, Adult When you have diabetes (diabetes mellitus), it is very important to have healthy eating habits because your blood sugar (glucose) levels are greatly affected by what you eat and drink. Eating healthy foods in the appropriate amounts, at about the same times every day, can help you:  Control your blood glucose.  Lower your risk of heart disease.  Improve your blood pressure.  Reach or maintain a healthy weight. Every person with diabetes is different, and each person has different needs for a meal plan. Your health care provider may recommend that you work with a diet and nutrition specialist (dietitian) to make a meal plan that is best for you. Your meal plan may vary depending on factors such as:  The calories you need.  The medicines you take.  Your weight.  Your blood glucose, blood pressure, and cholesterol levels.  Your activity level.  Other health conditions you have, such as heart or kidney disease. How do carbohydrates affect me? Carbohydrates, also called carbs, affect your blood glucose level more than any other type of food. Eating carbs naturally raises the amount of glucose in your blood. Carb counting is a method for keeping track of how many carbs you eat. Counting carbs is important to keep your blood glucose at a healthy level, especially if you use insulin or take certain oral diabetes medicines. It is important to know how many carbs you can safely have in each meal. This is different for every person. Your dietitian can help you calculate how many carbs you should have at each meal and for each snack. Foods that contain carbs include:  Bread, cereal, rice, pasta, and crackers.  Potatoes and corn.  Peas, beans, and lentils.  Milk and yogurt.   Fruit and juice.  Desserts, such as cakes, cookies, ice cream, and candy. How does alcohol affect me? Alcohol can cause a sudden decrease in blood glucose (hypoglycemia), especially if you use insulin or take certain oral diabetes medicines. Hypoglycemia can be a life-threatening condition. Symptoms of hypoglycemia (sleepiness, dizziness, and confusion) are similar to symptoms of having too much alcohol. If your health care provider says that alcohol is safe for you, follow these guidelines:  Limit alcohol intake to no more than 1 drink per day for nonpregnant women and 2 drinks per day for men. One drink equals 12 oz of beer, 5 oz of wine, or 1 oz of hard liquor.  Do not drink on an empty stomach.  Keep yourself hydrated with water, diet soda, or unsweetened iced tea.  Keep in mind that regular soda, juice, and other mixers may contain a lot of sugar and must be counted as carbs. What are tips for following this plan?  Reading food labels  Start by checking the serving size  on the "Nutrition Facts" label of packaged foods and drinks. The amount of calories, carbs, fats, and other nutrients listed on the label is based on one serving of the item. Many items contain more than one serving per package.  Check the total grams (g) of carbs in one serving. You can calculate the number of servings of carbs in one serving by dividing the total carbs by 15. For example, if a food has 30 g of total carbs, it would be equal to 2 servings of carbs.  Check the number of grams (g) of saturated and trans fats in one serving. Choose foods that have low or no amount of these fats.  Check the number of milligrams (mg) of salt (sodium) in one serving. Most people should limit total sodium intake to less than 2,300 mg per day.  Always check the nutrition information of foods labeled as "low-fat" or "nonfat". These foods may be higher in added sugar or refined carbs and should be avoided.  Talk to your  dietitian to identify your daily goals for nutrients listed on the label. Shopping  Avoid buying canned, premade, or processed foods. These foods tend to be high in fat, sodium, and added sugar.  Shop around the outside edge of the grocery store. This includes fresh fruits and vegetables, bulk grains, fresh meats, and fresh dairy. Cooking  Use low-heat cooking methods, such as baking, instead of high-heat cooking methods like deep frying.  Cook using healthy oils, such as olive, canola, or sunflower oil.  Avoid cooking with butter, cream, or high-fat meats. Meal planning  Eat meals and snacks regularly, preferably at the same times every day. Avoid going long periods of time without eating.  Eat foods high in fiber, such as fresh fruits, vegetables, beans, and whole grains. Talk to your dietitian about how many servings of carbs you can eat at each meal.  Eat 4-6 ounces (oz) of lean protein each day, such as lean meat, chicken, fish, eggs, or tofu. One oz of lean protein is equal to: ? 1 oz of meat, chicken, or fish. ? 1 egg. ?  cup of tofu.  Eat some foods each day that contain healthy fats, such as avocado, nuts, seeds, and fish. Lifestyle  Check your blood glucose regularly.  Exercise regularly as told by your health care provider. This may include: ? 150 minutes of moderate-intensity or vigorous-intensity exercise each week. This could be brisk walking, biking, or water aerobics. ? Stretching and doing strength exercises, such as yoga or weightlifting, at least 2 times a week.  Take medicines as told by your health care provider.  Do not use any products that contain nicotine or tobacco, such as cigarettes and e-cigarettes. If you need help quitting, ask your health care provider.  Work with a Social worker or diabetes educator to identify strategies to manage stress and any emotional and social challenges. Questions to ask a health care provider  Do I need to meet with a  diabetes educator?  Do I need to meet with a dietitian?  What number can I call if I have questions?  When are the best times to check my blood glucose? Where to find more information:  American Diabetes Association: diabetes.org  Academy of Nutrition and Dietetics: www.eatright.[employer] of Diabetes and Digestive and Kidney Diseases (NIH): [employer] Summary  A healthy meal plan will help you control your blood glucose and maintain a healthy lifestyle.  Working with a diet and nutrition specialist (dietitian)  can help you make a meal plan that is best for you.  Keep in mind that carbohydrates (carbs) and alcohol have immediate effects on your blood glucose levels. It is important to count carbs and to use alcohol carefully. This information is not intended to replace advice given to you by your health care provider. Make sure you discuss any questions you have with your health care provider. Document Revised: 03/08/2017 Document Reviewed: 04/30/2016 Elsevier Patient Education  2020 Elsevier Inc.       Agustina Caroli, MD [facility]

## 2019-08-26 NOTE — Assessment & Plan Note (Signed)
Systolic blood pressure a little high.  Continue present medication.  No changes.  Advised to monitor blood pressure readings at home.  Will make changes accordingly. Uncontrolled diabetes with hemoglobin A1c higher than before at 7.1.  Has not been very compliant with Metformin or diet.  Continue Metformin 500 mg twice a day.  Diet and nutrition discussed.  Weight loss encouraged. Follow-up in 3 months.

## 2019-08-26 NOTE — Patient Instructions (Addendum)
   If you have lab work done today you will be contacted with your lab results within the next 2 weeks.  If you have not heard from us then please contact us. The fastest way to get your results is to register for My Chart.   IF you received an x-ray today, you will receive an invoice from [facility]. Please contact [facility] at [phone] with questions or concerns regarding your invoice.   IF you received labwork today, you will receive an invoice from [facility]. Please contact [facility] at [phone] with questions or concerns regarding your invoice.   Our billing staff will not be able to assist you with questions regarding bills from these companies.  You will be contacted with the lab results as soon as they are available. The fastest way to get your results is to activate your My Chart account. Instructions are located on the last page of this paperwork. If you have not heard from us regarding the results in 2 weeks, please contact this office.      Diabetes Mellitus and Nutrition, Adult When you have diabetes (diabetes mellitus), it is very important to have healthy eating habits because your blood sugar (glucose) levels are greatly affected by what you eat and drink. Eating healthy foods in the appropriate amounts, at about the same times every day, can help you:  Control your blood glucose.  Lower your risk of heart disease.  Improve your blood pressure.  Reach or maintain a healthy weight. Every person with diabetes is different, and each person has different needs for a meal plan. Your health care provider may recommend that you work with a diet and nutrition specialist (dietitian) to make a meal plan that is best for you. Your meal plan may vary depending on factors such as:  The calories you need.  The medicines you take.  Your weight.  Your blood glucose, blood pressure, and cholesterol levels.  Your activity level.  Other health  conditions you have, such as heart or kidney disease. How do carbohydrates affect me? Carbohydrates, also called carbs, affect your blood glucose level more than any other type of food. Eating carbs naturally raises the amount of glucose in your blood. Carb counting is a method for keeping track of how many carbs you eat. Counting carbs is important to keep your blood glucose at a healthy level, especially if you use insulin or take certain oral diabetes medicines. It is important to know how many carbs you can safely have in each meal. This is different for every person. Your dietitian can help you calculate how many carbs you should have at each meal and for each snack. Foods that contain carbs include:  Bread, cereal, rice, pasta, and crackers.  Potatoes and corn.  Peas, beans, and lentils.  Milk and yogurt.  Fruit and juice.  Desserts, such as cakes, cookies, ice cream, and candy. How does alcohol affect me? Alcohol can cause a sudden decrease in blood glucose (hypoglycemia), especially if you use insulin or take certain oral diabetes medicines. Hypoglycemia can be a life-threatening condition. Symptoms of hypoglycemia (sleepiness, dizziness, and confusion) are similar to symptoms of having too much alcohol. If your health care provider says that alcohol is safe for you, follow these guidelines:  Limit alcohol intake to no more than 1 drink per day for nonpregnant women and 2 drinks per day for men. One drink equals 12 oz of beer, 5 oz of wine, or 1 oz of hard   liquor.  Do not drink on an empty stomach.  Keep yourself hydrated with water, diet soda, or unsweetened iced tea.  Keep in mind that regular soda, juice, and other mixers may contain a lot of sugar and must be counted as carbs. What are tips for following this plan?  Reading food labels  Start by checking the serving size on the "Nutrition Facts" label of packaged foods and drinks. The amount of calories, carbs, fats, and  other nutrients listed on the label is based on one serving of the item. Many items contain more than one serving per package.  Check the total grams (g) of carbs in one serving. You can calculate the number of servings of carbs in one serving by dividing the total carbs by 15. For example, if a food has 30 g of total carbs, it would be equal to 2 servings of carbs.  Check the number of grams (g) of saturated and trans fats in one serving. Choose foods that have low or no amount of these fats.  Check the number of milligrams (mg) of salt (sodium) in one serving. Most people should limit total sodium intake to less than 2,300 mg per day.  Always check the nutrition information of foods labeled as "low-fat" or "nonfat". These foods may be higher in added sugar or refined carbs and should be avoided.  Talk to your dietitian to identify your daily goals for nutrients listed on the label. Shopping  Avoid buying canned, premade, or processed foods. These foods tend to be high in fat, sodium, and added sugar.  Shop around the outside edge of the grocery store. This includes fresh fruits and vegetables, bulk grains, fresh meats, and fresh dairy. Cooking  Use low-heat cooking methods, such as baking, instead of high-heat cooking methods like deep frying.  Cook using healthy oils, such as olive, canola, or sunflower oil.  Avoid cooking with butter, cream, or high-fat meats. Meal planning  Eat meals and snacks regularly, preferably at the same times every day. Avoid going long periods of time without eating.  Eat foods high in fiber, such as fresh fruits, vegetables, beans, and whole grains. Talk to your dietitian about how many servings of carbs you can eat at each meal.  Eat 4-6 ounces (oz) of lean protein each day, such as lean meat, chicken, fish, eggs, or tofu. One oz of lean protein is equal to: ? 1 oz of meat, chicken, or fish. ? 1 egg. ?  cup of tofu.  Eat some foods each day that  contain healthy fats, such as avocado, nuts, seeds, and fish. Lifestyle  Check your blood glucose regularly.  Exercise regularly as told by your health care provider. This may include: ? 150 minutes of moderate-intensity or vigorous-intensity exercise each week. This could be brisk walking, biking, or water aerobics. ? Stretching and doing strength exercises, such as yoga or weightlifting, at least 2 times a week.  Take medicines as told by your health care provider.  Do not use any products that contain nicotine or tobacco, such as cigarettes and e-cigarettes. If you need help quitting, ask your health care provider.  Work with a counselor or diabetes educator to identify strategies to manage stress and any emotional and social challenges. Questions to ask a health care provider  Do I need to meet with a diabetes educator?  Do I need to meet with a dietitian?  What number can I call if I have questions?  When are the best   times to check my blood glucose? Where to find more information:  American Diabetes Association: diabetes.org  Academy of Nutrition and Dietetics: www.eatright.org  National Institute of Diabetes and Digestive and Kidney Diseases (NIH): www.niddk.nih.gov Summary  A healthy meal plan will help you control your blood glucose and maintain a healthy lifestyle.  Working with a diet and nutrition specialist (dietitian) can help you make a meal plan that is best for you.  Keep in mind that carbohydrates (carbs) and alcohol have immediate effects on your blood glucose levels. It is important to count carbs and to use alcohol carefully. This information is not intended to replace advice given to you by your health care provider. Make sure you discuss any questions you have with your health care provider. Document Revised: 03/08/2017 Document Reviewed: 04/30/2016 Elsevier Patient Education  2020 Elsevier Inc.  

## 2019-08-27 LAB — LIPID PANEL
Chol/HDL Ratio: 3.6 ratio (ref 0.0–5.0)
Cholesterol, Total: 178 mg/dL (ref 100–199)
HDL: 49 mg/dL (ref >39)
LDL Chol Calc (NIH): 114 mg/dL — ABNORMAL HIGH (ref 0–99)
Triglycerides: 78 mg/dL (ref 0–149)
VLDL Cholesterol Cal: 15 mg/dL (ref 5–40)

## 2019-08-27 LAB — CMP14+EGFR
ALT: 25 IU/L (ref 0–44)
AST: 26 IU/L (ref 0–40)
Albumin/Globulin Ratio: 1.6 (ref 1.2–2.2)
Albumin: 4.2 g/dL (ref 3.8–4.8)
Alkaline Phosphatase: 54 IU/L (ref 48–121)
BUN/Creatinine Ratio: 10 (ref 10–24)
BUN: 13 mg/dL (ref 8–27)
Bilirubin Total: 0.7 mg/dL (ref 0.0–1.2)
CO2: 24 mmol/L (ref 20–29)
Calcium: 9.4 mg/dL (ref 8.6–10.2)
Chloride: 96 mmol/L (ref 96–106)
Creatinine, Ser: 1.3 mg/dL — ABNORMAL HIGH (ref 0.76–1.27)
GFR calc Af Amer: 68 mL/min/{1.73_m2} (ref >59)
GFR calc non Af Amer: 58 mL/min/{1.73_m2} — ABNORMAL LOW (ref >59)
Globulin, Total: 2.7 g/dL (ref 1.5–4.5)
Glucose: 138 mg/dL — ABNORMAL HIGH (ref 65–99)
Potassium: 3.8 mmol/L (ref 3.5–5.2)
Sodium: 138 mmol/L (ref 134–144)
Total Protein: 6.9 g/dL (ref 6.0–8.5)

## 2019-11-25 ENCOUNTER — Encounter: Payer: Self-pay | Admitting: Emergency Medicine

## 2019-11-25 ENCOUNTER — Ambulatory Visit (INDEPENDENT_AMBULATORY_CARE_PROVIDER_SITE_OTHER): Payer: Self-pay | Admitting: Emergency Medicine

## 2019-11-25 ENCOUNTER — Other Ambulatory Visit: Payer: Self-pay

## 2019-11-25 VITALS — BP 132/70 | HR 56 | Temp 98.0°F | Ht 68.0 in | Wt 235.8 lb

## 2019-11-25 DIAGNOSIS — I10 Essential (primary) hypertension: Secondary | ICD-10-CM

## 2019-11-25 DIAGNOSIS — E785 Hyperlipidemia, unspecified: Secondary | ICD-10-CM

## 2019-11-25 DIAGNOSIS — Z6835 Body mass index (BMI) 35.0-35.9, adult: Secondary | ICD-10-CM

## 2019-11-25 DIAGNOSIS — E1159 Type 2 diabetes mellitus with other circulatory complications: Secondary | ICD-10-CM

## 2019-11-25 DIAGNOSIS — L723 Sebaceous cyst: Secondary | ICD-10-CM

## 2019-11-25 DIAGNOSIS — I152 Hypertension secondary to endocrine disorders: Secondary | ICD-10-CM

## 2019-11-25 DIAGNOSIS — E1169 Type 2 diabetes mellitus with other specified complication: Secondary | ICD-10-CM

## 2019-11-25 LAB — GLUCOSE, POCT (MANUAL RESULT ENTRY): POC Glucose: 138 mg/dl — AB (ref 70–99)

## 2019-11-25 LAB — POCT GLYCOSYLATED HEMOGLOBIN (HGB A1C): Hemoglobin A1C: 7.4 % — AB (ref 4.0–5.6)

## 2019-11-25 NOTE — Assessment & Plan Note (Signed)
Well-controlled hypertension. Continue present medication. No changes. Uncontrolled diabetes with hemoglobin A1c higher than before at 7.4. Will increase Metformin to 1000 mg twice a day and start glipizide 5 mg in the morning. Diet and nutrition discussed. Continue statin therapy with rosuvastatin 20 mg daily. Continue daily baby aspirin. Follow-up in 3 months.

## 2019-11-25 NOTE — Progress Notes (Signed)
Larry Dawson 64 y.o.   Chief Complaint  Patient presents with  . Medical Management of Chronic Issues    3 m f/u (SELF PAY)  . black head/ cyst on back    on the back for a year.     HISTORY OF PRESENT ILLNESS: This is a 64 y.o. male with history of hypertension, diabetes, and dyslipidemia here for follow-up. Also has a cyst on his back for least a year.  Needs dermatology referral. 1.  Hypertension: On Tenoretic 50-25 mg daily. BP Readings from Last 3 Encounters:  11/25/19 132/70  08/26/19 (!) 150/80  02/19/19 132/76    2.  Diabetes: On Metformin 500 mg twice a day Lab Results  Component Value Date   HGBA1C 7.1 (A) 08/26/2019    3.  Dyslipidemia: On rosuvastatin 20 mg daily. Also takes 1 baby aspirin a day. Has no other complaints or medical concerns today. Fully vaccinated against Covid.  Also had Covid infection in 2019 and 2020.  HPI   Prior to Admission medications   Medication Sig Start Date End Date Taking? Authorizing Provider  aspirin 81 MG tablet Take 81 mg by mouth daily.   Yes [provider]  atenolol-chlorthalidone (TENORETIC) 50-25 MG tablet Take 1 tablet by mouth daily. 08/26/19 11/25/19 Yes Zanylah Hardie, Eilleen Kempf, MD  cetirizine (ZYRTEC) 10 MG tablet Take 1 tablet (10 mg total) by mouth daily. 05/10/17  Yes Linus Mako B, NP  cholecalciferol (VITAMIN D) 1000 units tablet Take 1,000 Units by mouth daily.   Yes [provider]  metFORMIN (GLUCOPHAGE) 500 MG tablet Take 1 tablet (500 mg total) by mouth 2 (two) times daily with a meal. 08/26/19 11/25/19 Yes Michelle Wnek, Eilleen Kempf, MD  Omega-3 Fatty Acids (FISH OIL) 1000 MG CAPS Take by mouth.   Yes [provider]  omeprazole (PRILOSEC) 20 MG capsule Take 20 mg by mouth daily.   Yes [provider]  rosuvastatin (CRESTOR) 20 MG tablet Take 1 tablet (20 mg total) by mouth daily. 08/26/19  Yes Georgina Quint, MD    Allergies  Allergen Reactions  . Ace Inhibitors  Swelling    Suspected angioedema  . Penicillins Hives, Rash and Other (See Comments)    Throat swells  . Latex Hives, Swelling and Rash    Patient Active Problem List   Diagnosis Date Noted  . Type 2 diabetes mellitus with hyperglycemia, without long-term current use of insulin (HCC) 08/06/2018  . Dyslipidemia 08/06/2018  . COPD (chronic obstructive pulmonary disease) (HCC) 11/30/2012  . Hypertension associated with type 2 diabetes mellitus (HCC) 07/18/2011    Past Medical History:  Diagnosis Date  . Allergy   . Anal fissure   . COPD (chronic obstructive pulmonary disease) (HCC)    Albuterol PRN.  Marland Kitchen Hydrocele   . Hypertension   . IBS (irritable bowel syndrome) 05/22/13   per chart note-pt denied    Past Surgical History:  Procedure Laterality Date  . HERNIA REPAIR  prior to 2013  . HYDROCELE EXCISION Bilateral 11/23/2013   Procedure: HYDROCELECTOMY ADULT;  Surgeon: Valetta Fuller, MD;  Location: [facility];  Service: Urology;  Laterality: Bilateral;  . VASECTOMY  prior to 2013    Social History   Socioeconomic History  . Marital status: Married    Spouse name: Not on file  . Number of children: Not on file  . Years of education: Not on file  . Highest education level: Not on file  Occupational History  .  Not on file  Tobacco Use  . Smoking status: Former Games developer  . Smokeless tobacco: Never Used  Substance and Sexual Activity  . Alcohol use: No  . Drug use: No  . Sexual activity: Never  Other Topics Concern  . Not on file  Social History Narrative   Marital status: married      Employment: [employer]; drives all over.  Owns company.      Tobacco:  Quit smoking in 2005; smoked 25 years      Alcohol:  Rare beer      Drugs:  None      Exercise:  None   Daily caffeine    Social Determinants of Health   Financial Resource Strain:   . Difficulty of Paying Living Expenses:   Food Insecurity:   . Worried About Programme researcher, broadcasting/film/video in the  Last Year:   . Barista in the Last Year:   Transportation Needs:   . Freight forwarder (Medical):   Marland Kitchen Lack of Transportation (Non-Medical):   Physical Activity:   . Days of Exercise per Week:   . Minutes of Exercise per Session:   Stress:   . Feeling of Stress :   Social Connections:   . Frequency of Communication with Friends and Family:   . Frequency of Social Gatherings with Friends and Family:   . Attends Religious Services:   . Active Member of Clubs or Organizations:   . Attends Banker Meetings:   Marland Kitchen Marital Status:   Intimate Partner Violence:   . Fear of Current or Ex-Partner:   . Emotionally Abused:   Marland Kitchen Physically Abused:   . Sexually Abused:     Family History  Problem Relation Age of Onset  . Diabetes Father   . Hypertension Father   . Diabetes Brother   . Hypertension Brother   . Diabetes Brother   . Diabetes Brother      Review of Systems  Constitutional: Negative.  Negative for chills and fever.  HENT: Negative.  Negative for congestion and sore throat.   Respiratory: Negative.  Negative for cough and shortness of breath.   Cardiovascular: Negative.  Negative for chest pain and palpitations.  Gastrointestinal: Negative.  Negative for abdominal pain, diarrhea, nausea and vomiting.  Genitourinary: Negative.  Negative for dysuria and hematuria.  Musculoskeletal: Negative for back pain, myalgias and neck pain.  Skin: Negative.  Negative for rash.  Neurological: Negative for dizziness and headaches.  All other systems reviewed and are negative.  Today's Vitals   11/25/19 0902  BP: 132/70  Pulse: (!) 56  Temp: 98 F (36.7 C)  TempSrc: Temporal  SpO2: 98%  Weight: 235 lb 12.8 oz (107 kg)  Height: 5\' 8"  (1.727 m)   Body mass index is 35.85 kg/m.   Physical Exam Vitals reviewed.  Constitutional:      Appearance: Normal appearance.  HENT:     Head: Normocephalic.  Eyes:     Extraocular Movements: Extraocular movements  intact.     Pupils: Pupils are equal, round, and reactive to light.  Cardiovascular:     Rate and Rhythm: Normal rate and regular rhythm.     Pulses: Normal pulses.     Heart sounds: Normal heart sounds.  Pulmonary:     Effort: Pulmonary effort is normal.     Breath sounds: Normal breath sounds.  Musculoskeletal:     Cervical back: Normal range of motion.  Skin:    Findings:  Lesion (Sebaceous cyst to the back, noninfected) present.  Neurological:     General: No focal deficit present.     Mental Status: He is alert and oriented to person, place, and time.  Psychiatric:        Mood and Affect: Mood normal.        Behavior: Behavior normal.      Results for orders placed or performed in visit on 11/25/19 (from the past 24 hour(s))  POCT glucose (manual entry)     Status: Abnormal   Collection Time: 11/25/19  9:44 AM  Result Value Ref Range   POC Glucose 138 (A) 70 - 99 mg/dl  POCT glycosylated hemoglobin (Hb A1C)     Status: Abnormal   Collection Time: 11/25/19  9:54 AM  Result Value Ref Range   Hemoglobin A1C 7.4 (A) 4.0 - 5.6 %   HbA1c POC (<> result, manual entry)     HbA1c, POC (prediabetic range)     HbA1c, POC (controlled diabetic range)       ASSESSMENT & PLAN: Hypertension associated with type 2 diabetes mellitus (HCC) Well-controlled hypertension. Continue present medication. No changes. Uncontrolled diabetes with hemoglobin A1c higher than before at 7.4. Will increase Metformin to 1000 mg twice a day and start glipizide 5 mg in the morning. Diet and nutrition discussed. Continue statin therapy with rosuvastatin 20 mg daily. Continue daily baby aspirin. Follow-up in 3 months.  Artavious was seen today for medical management of chronic issues and black head/ cyst on back.  Diagnoses and all orders for this visit:  Hypertension associated with type 2 diabetes mellitus (HCC) -     POCT glucose (manual entry) -     POCT glycosylated hemoglobin (Hb A1C) -      Comprehensive metabolic panel -     Microalbumin, urine  Dyslipidemia associated with type 2 diabetes mellitus (HCC) -     Lipid panel  Sebaceous cyst -     Ambulatory referral to Dermatology  Body mass index (BMI) of 35.0-35.9 in adult    Patient Instructions   Increase Metformin to 1000 mg twice a day. Start glipizide 5 mg in the morning. Continue all other medications. Follow-up in 3 months. Diabetes Mellitus and Nutrition, Adult When you have diabetes (diabetes mellitus), it is very important to have healthy eating habits because your blood sugar (glucose) levels are greatly affected by what you eat and drink. Eating healthy foods in the appropriate amounts, at about the same times every day, can help you:  Control your blood glucose.  Lower your risk of heart disease.  Improve your blood pressure.  Reach or maintain a healthy weight. Every person with diabetes is different, and each person has different needs for a meal plan. Your health care provider may recommend that you work with a diet and nutrition specialist (dietitian) to make a meal plan that is best for you. Your meal plan may vary depending on factors such as:  The calories you need.  The medicines you take.  Your weight.  Your blood glucose, blood pressure, and cholesterol levels.  Your activity level.  Other health conditions you have, such as heart or kidney disease. How do carbohydrates affect me? Carbohydrates, also called carbs, affect your blood glucose level more than any other type of food. Eating carbs naturally raises the amount of glucose in your blood. Carb counting is a method for keeping track of how many carbs you eat. Counting carbs is important to keep your blood  glucose at a healthy level, especially if you use insulin or take certain oral diabetes medicines. It is important to know how many carbs you can safely have in each meal. This is different for every person. Your dietitian can  help you calculate how many carbs you should have at each meal and for each snack. Foods that contain carbs include:  Bread, cereal, rice, pasta, and crackers.  Potatoes and corn.  Peas, beans, and lentils.  Milk and yogurt.  Fruit and juice.  Desserts, such as cakes, cookies, ice cream, and candy. How does alcohol affect me? Alcohol can cause a sudden decrease in blood glucose (hypoglycemia), especially if you use insulin or take certain oral diabetes medicines. Hypoglycemia can be a life-threatening condition. Symptoms of hypoglycemia (sleepiness, dizziness, and confusion) are similar to symptoms of having too much alcohol. If your health care provider says that alcohol is safe for you, follow these guidelines:  Limit alcohol intake to no more than 1 drink per day for nonpregnant women and 2 drinks per day for men. One drink equals 12 oz of beer, 5 oz of wine, or 1 oz of hard liquor.  Do not drink on an empty stomach.  Keep yourself hydrated with water, diet soda, or unsweetened iced tea.  Keep in mind that regular soda, juice, and other mixers may contain a lot of sugar and must be counted as carbs. What are tips for following this plan?  Reading food labels  Start by checking the serving size on the "Nutrition Facts" label of packaged foods and drinks. The amount of calories, carbs, fats, and other nutrients listed on the label is based on one serving of the item. Many items contain more than one serving per package.  Check the total grams (g) of carbs in one serving. You can calculate the number of servings of carbs in one serving by dividing the total carbs by 15. For example, if a food has 30 g of total carbs, it would be equal to 2 servings of carbs.  Check the number of grams (g) of saturated and trans fats in one serving. Choose foods that have low or no amount of these fats.  Check the number of milligrams (mg) of salt (sodium) in one serving. Most people should limit  total sodium intake to less than 2,300 mg per day.  Always check the nutrition information of foods labeled as "low-fat" or "nonfat". These foods may be higher in added sugar or refined carbs and should be avoided.  Talk to your dietitian to identify your daily goals for nutrients listed on the label. Shopping  Avoid buying canned, premade, or processed foods. These foods tend to be high in fat, sodium, and added sugar.  Shop around the outside edge of the grocery store. This includes fresh fruits and vegetables, bulk grains, fresh meats, and fresh dairy. Cooking  Use low-heat cooking methods, such as baking, instead of high-heat cooking methods like deep frying.  Cook using healthy oils, such as olive, canola, or sunflower oil.  Avoid cooking with butter, cream, or high-fat meats. Meal planning  Eat meals and snacks regularly, preferably at the same times every day. Avoid going long periods of time without eating.  Eat foods high in fiber, such as fresh fruits, vegetables, beans, and whole grains. Talk to your dietitian about how many servings of carbs you can eat at each meal.  Eat 4-6 ounces (oz) of lean protein each day, such as lean meat, chicken, fish, eggs,  or tofu. One oz of lean protein is equal to: ? 1 oz of meat, chicken, or fish. ? 1 egg. ?  cup of tofu.  Eat some foods each day that contain healthy fats, such as avocado, nuts, seeds, and fish. Lifestyle  Check your blood glucose regularly.  Exercise regularly as told by your health care provider. This may include: ? 150 minutes of moderate-intensity or vigorous-intensity exercise each week. This could be brisk walking, biking, or water aerobics. ? Stretching and doing strength exercises, such as yoga or weightlifting, at least 2 times a week.  Take medicines as told by your health care provider.  Do not use any products that contain nicotine or tobacco, such as cigarettes and e-cigarettes. If you need help  quitting, ask your health care provider.  Work with a Veterinary surgeon or diabetes educator to identify strategies to manage stress and any emotional and social challenges. Questions to ask a health care provider  Do I need to meet with a diabetes educator?  Do I need to meet with a dietitian?  What number can I call if I have questions?  When are the best times to check my blood glucose? Where to find more information:  American Diabetes Association: diabetes.org  Academy of Nutrition and Dietetics: www.eatright.[employer] of Diabetes and Digestive and Kidney Diseases (NIH): CarFlippers.tn Summary  A healthy meal plan will help you control your blood glucose and maintain a healthy lifestyle.  Working with a diet and nutrition specialist (dietitian) can help you make a meal plan that is best for you.  Keep in mind that carbohydrates (carbs) and alcohol have immediate effects on your blood glucose levels. It is important to count carbs and to use alcohol carefully. This information is not intended to replace advice given to you by your health care provider. Make sure you discuss any questions you have with your health care provider. Document Revised: 03/08/2017 Document Reviewed: 04/30/2016 Elsevier Patient Education  [employer].    If you have lab work done today you will be contacted with your lab results within the next 2 weeks.  If you have not heard from [place] then please contact us. The fastest way to get your results is to register for My Chart.   IF you received an x-ray today, you will receive an invoice from [facility]. Please contact [facility] at [phone] with questions or concerns regarding your invoice.   IF you received labwork today, you will receive an invoice from [facility]. Please contact [facility] at [phone] with questions or concerns regarding your invoice.   Our billing staff will not be able to assist you with  questions regarding bills from these companies.  You will be contacted with the lab results as soon as they are available. The fastest way to get your results is to activate your My Chart account. Instructions are located on the last page of this paperwork. If you have not heard from [place] regarding the results in 2 weeks, please contact this office.          Edwina Barth, MD [facility]

## 2019-11-25 NOTE — Patient Instructions (Addendum)
Increase Metformin to 1000 mg twice a day. Start glipizide 5 mg in the morning. Continue all other medications. Follow-up in 3 months. Diabetes Mellitus and Nutrition, Adult When you have diabetes (diabetes mellitus), it is very important to have healthy eating habits because your blood sugar (glucose) levels are greatly affected by what you eat and drink. Eating healthy foods in the appropriate amounts, at about the same times every day, can help you:  Control your blood glucose.  Lower your risk of heart disease.  Improve your blood pressure.  Reach or maintain a healthy weight. Every person with diabetes is different, and each person has different needs for a meal plan. Your health care provider may recommend that you work with a diet and nutrition specialist (dietitian) to make a meal plan that is best for you. Your meal plan may vary depending on factors such as:  The calories you need.  The medicines you take.  Your weight.  Your blood glucose, blood pressure, and cholesterol levels.  Your activity level.  Other health conditions you have, such as heart or kidney disease. How do carbohydrates affect me? Carbohydrates, also called carbs, affect your blood glucose level more than any other type of food. Eating carbs naturally raises the amount of glucose in your blood. Carb counting is a method for keeping track of how many carbs you eat. Counting carbs is important to keep your blood glucose at a healthy level, especially if you use insulin or take certain oral diabetes medicines. It is important to know how many carbs you can safely have in each meal. This is different for every person. Your dietitian can help you calculate how many carbs you should have at each meal and for each snack. Foods that contain carbs include:  Bread, cereal, rice, pasta, and crackers.  Potatoes and corn.  Peas, beans, and lentils.  Milk and yogurt.  Fruit and juice.  Desserts, such as cakes,  cookies, ice cream, and candy. How does alcohol affect me? Alcohol can cause a sudden decrease in blood glucose (hypoglycemia), especially if you use insulin or take certain oral diabetes medicines. Hypoglycemia can be a life-threatening condition. Symptoms of hypoglycemia (sleepiness, dizziness, and confusion) are similar to symptoms of having too much alcohol. If your health care provider says that alcohol is safe for you, follow these guidelines:  Limit alcohol intake to no more than 1 drink per day for nonpregnant women and 2 drinks per day for men. One drink equals 12 oz of beer, 5 oz of wine, or 1 oz of hard liquor.  Do not drink on an empty stomach.  Keep yourself hydrated with water, diet soda, or unsweetened iced tea.  Keep in mind that regular soda, juice, and other mixers may contain a lot of sugar and must be counted as carbs. What are tips for following this plan?  Reading food labels  Start by checking the serving size on the "Nutrition Facts" label of packaged foods and drinks. The amount of calories, carbs, fats, and other nutrients listed on the label is based on one serving of the item. Many items contain more than one serving per package.  Check the total grams (g) of carbs in one serving. You can calculate the number of servings of carbs in one serving by dividing the total carbs by 15. For example, if a food has 30 g of total carbs, it would be equal to 2 servings of carbs.  Check the number of grams (g) of  saturated and trans fats in one serving. Choose foods that have low or no amount of these fats.  Check the number of milligrams (mg) of salt (sodium) in one serving. Most people should limit total sodium intake to less than 2,300 mg per day.  Always check the nutrition information of foods labeled as "low-fat" or "nonfat". These foods may be higher in added sugar or refined carbs and should be avoided.  Talk to your dietitian to identify your daily goals for  nutrients listed on the label. Shopping  Avoid buying canned, premade, or processed foods. These foods tend to be high in fat, sodium, and added sugar.  Shop around the outside edge of the grocery store. This includes fresh fruits and vegetables, bulk grains, fresh meats, and fresh dairy. Cooking  Use low-heat cooking methods, such as baking, instead of high-heat cooking methods like deep frying.  Cook using healthy oils, such as olive, canola, or sunflower oil.  Avoid cooking with butter, cream, or high-fat meats. Meal planning  Eat meals and snacks regularly, preferably at the same times every day. Avoid going long periods of time without eating.  Eat foods high in fiber, such as fresh fruits, vegetables, beans, and whole grains. Talk to your dietitian about how many servings of carbs you can eat at each meal.  Eat 4-6 ounces (oz) of lean protein each day, such as lean meat, chicken, fish, eggs, or tofu. One oz of lean protein is equal to: ? 1 oz of meat, chicken, or fish. ? 1 egg. ?  cup of tofu.  Eat some foods each day that contain healthy fats, such as avocado, nuts, seeds, and fish. Lifestyle  Check your blood glucose regularly.  Exercise regularly as told by your health care provider. This may include: ? 150 minutes of moderate-intensity or vigorous-intensity exercise each week. This could be brisk walking, biking, or water aerobics. ? Stretching and doing strength exercises, such as yoga or weightlifting, at least 2 times a week.  Take medicines as told by your health care provider.  Do not use any products that contain nicotine or tobacco, such as cigarettes and e-cigarettes. If you need help quitting, ask your health care provider.  Work with a Veterinary surgeon or diabetes educator to identify strategies to manage stress and any emotional and social challenges. Questions to ask a health care provider  Do I need to meet with a diabetes educator?  Do I need to meet with a  dietitian?  What number can I call if I have questions?  When are the best times to check my blood glucose? Where to find more information:  American Diabetes Association: diabetes.org  Academy of Nutrition and Dietetics: www.eatright.[employer] of Diabetes and Digestive and Kidney Diseases (NIH): CarFli[employer].tn Summary  A healthy meal plan will help you control your blood glucose and maintain a healthy lifestyle.  Working with a diet and nutrition specialist (dietitian) can help you make a meal plan that is best for you.  Keep in mind that carbohydrates (carbs) and alcohol have immediate effects on your blood glucose levels. It is important to count carbs and to use alcohol carefully. This information is not intended to replace advice given to you by your health care provider. Make sure you discuss any questions you have with your health care provider. Document Revised: 03/08/2017 Document Reviewed: 04/30/2016 Elsevier Patient Education  [employer].    If you have lab work done today you will be contacted with your  lab results within the next 2 weeks.  If you have not heard from [place] then please contact us. The fastest way to get your results is to register for My Chart.   IF you received an x-ray today, you will receive an invoice from [facility]. Please contact [facility] at [phone] with questions or concerns regarding your invoice.   IF you received labwork today, you will receive an invoice from [facility]. Please contact [facility] at [phone] with questions or concerns regarding your invoice.   Our billing staff will not be able to assist you with questions regarding bills from these companies.  You will be contacted with the lab results as soon as they are available. The fastest way to get your results is to activate your My Chart account. Instructions are located on the last page of this paperwork. If you have not heard from [place]  regarding the results in 2 weeks, please contact this office.

## 2019-11-26 LAB — COMPREHENSIVE METABOLIC PANEL
ALT: 24 IU/L (ref 0–44)
AST: 21 IU/L (ref 0–40)
Albumin/Globulin Ratio: 1.4 (ref 1.2–2.2)
Albumin: 4.2 g/dL (ref 3.8–4.8)
Alkaline Phosphatase: 57 IU/L (ref 48–121)
BUN/Creatinine Ratio: 13 (ref 10–24)
BUN: 15 mg/dL (ref 8–27)
Bilirubin Total: 0.9 mg/dL (ref 0.0–1.2)
CO2: 28 mmol/L (ref 20–29)
Calcium: 9.4 mg/dL (ref 8.6–10.2)
Chloride: 99 mmol/L (ref 96–106)
Creatinine, Ser: 1.18 mg/dL (ref 0.76–1.27)
GFR calc Af Amer: 75 mL/min/{1.73_m2} (ref >59)
GFR calc non Af Amer: 65 mL/min/{1.73_m2} (ref >59)
Globulin, Total: 2.9 g/dL (ref 1.5–4.5)
Glucose: 131 mg/dL — ABNORMAL HIGH (ref 65–99)
Potassium: 3.9 mmol/L (ref 3.5–5.2)
Sodium: 140 mmol/L (ref 134–144)
Total Protein: 7.1 g/dL (ref 6.0–8.5)

## 2019-11-26 LAB — LIPID PANEL
Chol/HDL Ratio: 2.5 ratio (ref 0.0–5.0)
Cholesterol, Total: 115 mg/dL (ref 100–199)
HDL: 46 mg/dL (ref >39)
LDL Chol Calc (NIH): 56 mg/dL (ref 0–99)
Triglycerides: 57 mg/dL (ref 0–149)
VLDL Cholesterol Cal: 13 mg/dL (ref 5–40)

## 2019-11-26 LAB — MICROALBUMIN, URINE: Microalbumin, Urine: <3 ug/mL

## 2019-12-02 ENCOUNTER — Other Ambulatory Visit: Payer: Self-pay | Admitting: Emergency Medicine

## 2019-12-02 DIAGNOSIS — I10 Essential (primary) hypertension: Secondary | ICD-10-CM

## 2019-12-02 NOTE — Telephone Encounter (Signed)
Requested Prescriptions  Pending Prescriptions Disp Refills   atenolol-chlorthalidone (TENORETIC) 50-25 MG tablet [Pharmacy Med Name: ATENOLOL/CHLORTHALIDONE 50/25 TABS] 90 tablet 1    Sig: TAKE 1 TABLET BY MOUTH DAILY     Cardiovascular: Beta Blocker + Diuretic Combos Passed - 12/02/2019  7:58 PM      Passed - K in normal range and within 180 days    Potassium  Date Value Ref Range Status  11/25/2019 3.9 3.5 - 5.2 mmol/L Final         Passed - Na in normal range and within 180 days    Sodium  Date Value Ref Range Status  11/25/2019 140 134 - 144 mmol/L Final         Passed - Cr in normal range and within 180 days    Creat  Date Value Ref Range Status  11/19/2015 1.27 0.70 - 1.33 mg/dL Final    Comment:      For patients > or = 63 years of age: The upper reference limit for Creatinine is approximately 13% higher for people identified as African-American.      Creatinine, Ser  Date Value Ref Range Status  11/25/2019 1.18 0.76 - 1.27 mg/dL Final         Passed - Ca in normal range and within 180 days    Calcium  Date Value Ref Range Status  11/25/2019 9.4 8.6 - 10.2 mg/dL Final         Passed - Patient is not pregnant      Passed - Last BP in normal range    BP Readings from Last 1 Encounters:  11/25/19 132/70         Passed - Last Heart Rate in normal range    Pulse Readings from Last 1 Encounters:  11/25/19 (!) 56         Passed - Valid encounter within last 6 months    Recent Outpatient Visits          1 week ago Hypertension associated with type 2 diabetes mellitus (HCC)   [facility] [place], [place], MD   3 months ago Hypertension associated with type 2 diabetes mellitus [facility])   [facility] at [place], [place], MD   9 months ago Hypertension associated with type 2 diabetes mellitus [facility])   [facility] at [place], [place], MD   1 year ago Hypertension associated with type 2 diabetes mellitus [facility])   Primary  Care at [facility], Eilleen Kempf, MD   1 year ago Essential hypertension   Primary Care at [facility], Eilleen Kempf, MD      Future Appointments            In 4 weeks Sagardia, Eilleen Kempf, MD [facility] at [place], [facility]   In 2 months Sagardia, Eilleen Kempf, MD Primary Care at [place], [facility]

## 2019-12-30 ENCOUNTER — Encounter: Payer: Self-pay | Admitting: Emergency Medicine

## 2020-02-24 ENCOUNTER — Other Ambulatory Visit: Payer: Self-pay

## 2020-02-24 ENCOUNTER — Encounter: Payer: Self-pay | Admitting: Emergency Medicine

## 2020-02-24 ENCOUNTER — Ambulatory Visit (INDEPENDENT_AMBULATORY_CARE_PROVIDER_SITE_OTHER): Payer: Self-pay | Admitting: Emergency Medicine

## 2020-02-24 ENCOUNTER — Telehealth: Payer: Self-pay | Admitting: *Deleted

## 2020-02-24 VITALS — BP 132/72 | HR 67 | Temp 98.5°F | Resp 16 | Ht 68.0 in | Wt 233.0 lb

## 2020-02-24 DIAGNOSIS — E1159 Type 2 diabetes mellitus with other circulatory complications: Secondary | ICD-10-CM

## 2020-02-24 DIAGNOSIS — I152 Hypertension secondary to endocrine disorders: Secondary | ICD-10-CM

## 2020-02-24 DIAGNOSIS — Z6835 Body mass index (BMI) 35.0-35.9, adult: Secondary | ICD-10-CM

## 2020-02-24 DIAGNOSIS — E1169 Type 2 diabetes mellitus with other specified complication: Secondary | ICD-10-CM

## 2020-02-24 DIAGNOSIS — E785 Hyperlipidemia, unspecified: Secondary | ICD-10-CM

## 2020-02-24 DIAGNOSIS — Z1211 Encounter for screening for malignant neoplasm of colon: Secondary | ICD-10-CM

## 2020-02-24 DIAGNOSIS — E1165 Type 2 diabetes mellitus with hyperglycemia: Secondary | ICD-10-CM

## 2020-02-24 LAB — POCT GLYCOSYLATED HEMOGLOBIN (HGB A1C): Hemoglobin A1C: 7.3 % — AB (ref 4.0–5.6)

## 2020-02-24 LAB — GLUCOSE, POCT (MANUAL RESULT ENTRY): POC Glucose: 188 mg/dl — AB (ref 70–99)

## 2020-02-24 MED ORDER — METFORMIN HCL 1000 MG PO TABS: 1000.0000 mg | ORAL_TABLET | Freq: Two times a day (BID) | ORAL | 3 refills | Status: DC

## 2020-02-24 MED ORDER — GLIPIZIDE 5 MG PO TABS: 5.0000 mg | ORAL_TABLET | Freq: Two times a day (BID) | ORAL | 3 refills | Status: DC

## 2020-02-24 NOTE — Telephone Encounter (Signed)
Faxed Cologuard requisition to [facility]. Confirmation page 10:13 am. 

## 2020-02-24 NOTE — Assessment & Plan Note (Signed)
Well-controlled hypertension.  Continue present medication.  No change. Uncontrolled diabetes with hemoglobin A1c of 7.3.  Patient did not start medications as prescribed during last visit.  Continue Metformin 1000 mg twice a day and start glipizide 5 mg twice a day with meals. Diet and nutrition discussed with patient. Follow-up in 3 months.

## 2020-02-24 NOTE — Progress Notes (Signed)
Larry Dawson 64 y.o.   Chief Complaint  Patient presents with  . Diabetes    follow up 3 month  . Hypertension    HISTORY OF PRESENT ILLNESS: This is a 64 y.o. male with history of hypertension, diabetes, and dyslipidemia here for follow-up. #1 diabetes: On Metformin 1000 mg twice a day.  Did not start glipizide as prescribed during last visit. #2 hypertension: Doing well.  On Tenoretic 50-25 mg daily. #3 dyslipidemia: On rosuvastatin 20 mg daily. Has no complaints or medical concerns today. Fully vaccinated against Covid.   Lab Results  Component Value Date   HGBA1C 7.4 (A) 11/25/2019   BP Readings from Last 3 Encounters:  02/24/20 132/72  11/25/19 132/70  08/26/19 (!) 150/80    HPI   Prior to Admission medications   Medication Sig Start Date End Date Taking? Authorizing Provider  aspirin 81 MG tablet Take 81 mg by mouth daily.   Yes [provider]  atenolol-chlorthalidone (TENORETIC) 50-25 MG tablet TAKE 1 TABLET BY MOUTH DAILY 12/02/19 03/01/20 Yes Sharonann Malbrough, Eilleen Kempf, MD  cetirizine (ZYRTEC) 10 MG tablet Take 1 tablet (10 mg total) by mouth daily. 05/10/17  Yes Linus Mako B, NP  cholecalciferol (VITAMIN D) 1000 units tablet Take 1,000 Units by mouth daily.   Yes [provider]  Omega-3 Fatty Acids (FISH OIL) 1000 MG CAPS Take by mouth.   Yes [provider]  omeprazole (PRILOSEC) 20 MG capsule Take 20 mg by mouth daily.   Yes [provider]  rosuvastatin (CRESTOR) 20 MG tablet Take 1 tablet (20 mg total) by mouth daily. 08/26/19  Yes Charlies Rayburn, Eilleen Kempf, MD  Turmeric POWD by Does not apply route daily.   Yes [provider]  metFORMIN (GLUCOPHAGE) 500 MG tablet Take 1 tablet (500 mg total) by mouth 2 (two) times daily with a meal. 08/26/19 11/25/19  Georgina Quint, MD    Allergies  Allergen Reactions  . Ace Inhibitors Swelling    Suspected angioedema  . Penicillins Hives, Rash and Other (See Comments)     Throat swells  . Latex Hives, Swelling and Rash    Patient Active Problem List   Diagnosis Date Noted  . Type 2 diabetes mellitus with hyperglycemia, without long-term current use of insulin (HCC) 08/06/2018  . Dyslipidemia 08/06/2018  . Hypertension associated with type 2 diabetes mellitus (HCC) 07/18/2011    Past Medical History:  Diagnosis Date  . Allergy   . Anal fissure   . COPD (chronic obstructive pulmonary disease) (HCC)    Albuterol PRN.  Marland Kitchen Hydrocele   . Hypertension   . IBS (irritable bowel syndrome) 05/22/13   per chart note-pt denied    Past Surgical History:  Procedure Laterality Date  . HERNIA REPAIR  prior to 2013  . HYDROCELE EXCISION Bilateral 11/23/2013   Procedure: HYDROCELECTOMY ADULT;  Surgeon: Valetta Fuller, MD;  Location: [facility];  Service: Urology;  Laterality: Bilateral;  . VASECTOMY  prior to 2013    Social History   Socioeconomic History  . Marital status: Married    Spouse name: Not on file  . Number of children: Not on file  . Years of education: Not on file  . Highest education level: Not on file  Occupational History  . Not on file  Tobacco Use  . Smoking status: Former Games developer  . Smokeless tobacco: Never Used  Substance and Sexual Activity  . Alcohol use: No  . Drug use: No  .  Sexual activity: Never  Other Topics Concern  . Not on file  Social History Narrative   Marital status: married      Employment: [employer]; drives all over.  Owns company.      Tobacco:  Quit smoking in 2005; smoked 25 years      Alcohol:  Rare beer      Drugs:  None      Exercise:  None   Daily caffeine    Social Determinants of Health   Financial Resource Strain:   . Difficulty of Paying Living Expenses: Not on file  Food Insecurity:   . Worried About Programme researcher, broadcasting/film/video in the Last Year: Not on file  . Ran Out of Food in the Last Year: Not on file  Transportation Needs:   . Lack of Transportation (Medical): Not on  file  . Lack of Transportation (Non-Medical): Not on file  Physical Activity:   . Days of Exercise per Week: Not on file  . Minutes of Exercise per Session: Not on file  Stress:   . Feeling of Stress : Not on file  Social Connections:   . Frequency of Communication with Friends and Family: Not on file  . Frequency of Social Gatherings with Friends and Family: Not on file  . Attends Religious Services: Not on file  . Active Member of Clubs or Organizations: Not on file  . Attends Banker Meetings: Not on file  . Marital Status: Not on file  Intimate Partner Violence:   . Fear of Current or Ex-Partner: Not on file  . Emotionally Abused: Not on file  . Physically Abused: Not on file  . Sexually Abused: Not on file    Family History  Problem Relation Age of Onset  . Diabetes Father   . Hypertension Father   . Diabetes Brother   . Hypertension Brother   . Diabetes Brother   . Diabetes Brother      Review of Systems  Constitutional: Negative.  Negative for chills and fever.  HENT: Negative.  Negative for congestion and sore throat.   Respiratory: Negative.  Negative for cough and shortness of breath.   Cardiovascular: Negative.  Negative for chest pain and palpitations.  Gastrointestinal: Negative.  Negative for abdominal pain, diarrhea, nausea and vomiting.  Genitourinary: Negative.  Negative for dysuria and hematuria.  Musculoskeletal: Negative.   Skin: Negative.  Negative for rash.  Neurological: Negative.  Negative for dizziness and headaches.  All other systems reviewed and are negative.   Today's Vitals   02/24/20 0929  BP: 132/72  Pulse: 67  Resp: 16  Temp: 98.5 F (36.9 C)  TempSrc: Temporal  SpO2: 98%  Weight: 233 lb (105.7 kg)  Height: 5\' 8"  (1.727 m)   Body mass index is 35.43 kg/m. Wt Readings from Last 3 Encounters:  02/24/20 233 lb (105.7 kg)  11/25/19 235 lb 12.8 oz (107 kg)  08/26/19 242 lb (109.8 kg)    Physical Exam Vitals  reviewed.  Constitutional:      Appearance: Normal appearance.  HENT:     Head: Normocephalic.  Eyes:     Extraocular Movements: Extraocular movements intact.     Conjunctiva/sclera: Conjunctivae normal.     Pupils: Pupils are equal, round, and reactive to light.  Cardiovascular:     Rate and Rhythm: Normal rate and regular rhythm.     Pulses: Normal pulses.     Heart sounds: Normal heart sounds.  Pulmonary:  Effort: Pulmonary effort is normal.     Breath sounds: Normal breath sounds.  Musculoskeletal:        General: Normal range of motion.     Cervical back: Normal range of motion and neck supple.  Skin:    General: Skin is warm and dry.     Capillary Refill: Capillary refill takes less than 2 seconds.  Neurological:     General: No focal deficit present.     Mental Status: He is alert and oriented to person, place, and time.  Psychiatric:        Mood and Affect: Mood normal.        Behavior: Behavior normal.    Results for orders placed or performed in visit on 02/24/20 (from the past 24 hour(s))  POCT glucose (manual entry)     Status: Abnormal   Collection Time: 02/24/20  9:45 AM  Result Value Ref Range   POC Glucose 188 (A) 70 - 99 mg/dl  POCT glycosylated hemoglobin (Hb A1C)     Status: Abnormal   Collection Time: 02/24/20  9:50 AM  Result Value Ref Range   Hemoglobin A1C 7.3 (A) 4.0 - 5.6 %   HbA1c POC (<> result, manual entry)     HbA1c, POC (prediabetic range)     HbA1c, POC (controlled diabetic range)       ASSESSMENT & PLAN: Hypertension associated with type 2 diabetes mellitus (HCC) Well-controlled hypertension.  Continue present medication.  No change. Uncontrolled diabetes with hemoglobin A1c of 7.3.  Patient did not start medications as prescribed during last visit.  Continue Metformin 1000 mg twice a day and start glipizide 5 mg twice a day with meals. Diet and nutrition discussed with patient. Follow-up in 3 months.  Hamish was seen today for  diabetes and hypertension.  Diagnoses and all orders for this visit:  Hypertension associated with type 2 diabetes mellitus (HCC) -     POCT glucose (manual entry) -     POCT glycosylated hemoglobin (Hb A1C) -     HM Diabetes Foot Exam  Dyslipidemia associated with type 2 diabetes mellitus (HCC)  Body mass index (BMI) of 35.0-35.9 in adult  Screening for colon cancer -     Cologuard  Type 2 diabetes mellitus with hyperglycemia, without long-term current use of insulin (HCC) -     glipiZIDE (GLUCOTROL) 5 MG tablet; Take 1 tablet (5 mg total) by mouth 2 (two) times daily with a meal. -     metFORMIN (GLUCOPHAGE) 1000 MG tablet; Take 1 tablet (1,000 mg total) by mouth 2 (two) times daily with a meal.    Patient Instructions   Increase Metformin to 1000 mg twice a day. Start glipizide 5 mg twice a day with meals. Follow-up in 3 months. Diabetes Mellitus and Nutrition, Adult When you have diabetes (diabetes mellitus), it is very important to have healthy eating habits because your blood sugar (glucose) levels are greatly affected by what you eat and drink. Eating healthy foods in the appropriate amounts, at about the same times every day, can help you:  Control your blood glucose.  Lower your risk of heart disease.  Improve your blood pressure.  Reach or maintain a healthy weight. Every person with diabetes is different, and each person has different needs for a meal plan. Your health care provider may recommend that you work with a diet and nutrition specialist (dietitian) to make a meal plan that is best for you. Your meal plan may vary depending  on factors such as:  The calories you need.  The medicines you take.  Your weight.  Your blood glucose, blood pressure, and cholesterol levels.  Your activity level.  Other health conditions you have, such as heart or kidney disease. How do carbohydrates affect me? Carbohydrates, also called carbs, affect your blood glucose  level more than any other type of food. Eating carbs naturally raises the amount of glucose in your blood. Carb counting is a method for keeping track of how many carbs you eat. Counting carbs is important to keep your blood glucose at a healthy level, especially if you use insulin or take certain oral diabetes medicines. It is important to know how many carbs you can safely have in each meal. This is different for every person. Your dietitian can help you calculate how many carbs you should have at each meal and for each snack. Foods that contain carbs include:  Bread, cereal, rice, pasta, and crackers.  Potatoes and corn.  Peas, beans, and lentils.  Milk and yogurt.  Fruit and juice.  Desserts, such as cakes, cookies, ice cream, and candy. How does alcohol affect me? Alcohol can cause a sudden decrease in blood glucose (hypoglycemia), especially if you use insulin or take certain oral diabetes medicines. Hypoglycemia can be a life-threatening condition. Symptoms of hypoglycemia (sleepiness, dizziness, and confusion) are similar to symptoms of having too much alcohol. If your health care provider says that alcohol is safe for you, follow these guidelines:  Limit alcohol intake to no more than 1 drink per day for nonpregnant women and 2 drinks per day for men. One drink equals 12 oz of beer, 5 oz of wine, or 1 oz of hard liquor.  Do not drink on an empty stomach.  Keep yourself hydrated with water, diet soda, or unsweetened iced tea.  Keep in mind that regular soda, juice, and other mixers may contain a lot of sugar and must be counted as carbs. What are tips for following this plan?  Reading food labels  Start by checking the serving size on the "Nutrition Facts" label of packaged foods and drinks. The amount of calories, carbs, fats, and other nutrients listed on the label is based on one serving of the item. Many items contain more than one serving per package.  Check the total  grams (g) of carbs in one serving. You can calculate the number of servings of carbs in one serving by dividing the total carbs by 15. For example, if a food has 30 g of total carbs, it would be equal to 2 servings of carbs.  Check the number of grams (g) of saturated and trans fats in one serving. Choose foods that have low or no amount of these fats.  Check the number of milligrams (mg) of salt (sodium) in one serving. Most people should limit total sodium intake to less than 2,300 mg per day.  Always check the nutrition information of foods labeled as "low-fat" or "nonfat". These foods may be higher in added sugar or refined carbs and should be avoided.  Talk to your dietitian to identify your daily goals for nutrients listed on the label. Shopping  Avoid buying canned, premade, or processed foods. These foods tend to be high in fat, sodium, and added sugar.  Shop around the outside edge of the grocery store. This includes fresh fruits and vegetables, bulk grains, fresh meats, and fresh dairy. Cooking  Use low-heat cooking methods, such as baking, instead of high-heat cooking  methods like deep frying.  Cook using healthy oils, such as olive, canola, or sunflower oil.  Avoid cooking with butter, cream, or high-fat meats. Meal planning  Eat meals and snacks regularly, preferably at the same times every day. Avoid going long periods of time without eating.  Eat foods high in fiber, such as fresh fruits, vegetables, beans, and whole grains. Talk to your dietitian about how many servings of carbs you can eat at each meal.  Eat 4-6 ounces (oz) of lean protein each day, such as lean meat, chicken, fish, eggs, or tofu. One oz of lean protein is equal to: ? 1 oz of meat, chicken, or fish. ? 1 egg. ?  cup of tofu.  Eat some foods each day that contain healthy fats, such as avocado, nuts, seeds, and fish. Lifestyle  Check your blood glucose regularly.  Exercise regularly as told by your  health care provider. This may include: ? 150 minutes of moderate-intensity or vigorous-intensity exercise each week. This could be brisk walking, biking, or water aerobics. ? Stretching and doing strength exercises, such as yoga or weightlifting, at least 2 times a week.  Take medicines as told by your health care provider.  Do not use any products that contain nicotine or tobacco, such as cigarettes and e-cigarettes. If you need help quitting, ask your health care provider.  Work with a Veterinary surgeon or diabetes educator to identify strategies to manage stress and any emotional and social challenges. Questions to ask a health care provider  Do I need to meet with a diabetes educator?  Do I need to meet with a dietitian?  What number can I call if I have questions?  When are the best times to check my blood glucose? Where to find more information:  American Diabetes Association: diabetes.org  Academy of Nutrition and Dietetics: www.eatright.[employer] of Diabetes and Digestive and Kidney Diseases (NIH): CarFlippers.tn Summary  A healthy meal plan will help you control your blood glucose and maintain a healthy lifestyle.  Working with a diet and nutrition specialist (dietitian) can help you make a meal plan that is best for you.  Keep in mind that carbohydrates (carbs) and alcohol have immediate effects on your blood glucose levels. It is important to count carbs and to use alcohol carefully. This information is not intended to replace advice given to you by your health care provider. Make sure you discuss any questions you have with your health care provider. Document Revised: 03/08/2017 Document Reviewed: 04/30/2016 Elsevier Patient Education  [employer].     If you have lab work done today you will be contacted with your lab results within the next 2 weeks.  If you have not heard from [place] then please contact us. The fastest way to get your results is to  register for My Chart.   IF you received an x-ray today, you will receive an invoice from [facility]. Please contact [facility] at [phone] with questions or concerns regarding your invoice.   IF you received labwork today, you will receive an invoice from [place]. Please contact [facility] at [phone] with questions or concerns regarding your invoice.   Our billing staff will not be able to assist you with questions regarding bills from these companies.  You will be contacted with the lab results as soon as they are available. The fastest way to get your results is to activate your My Chart account. Instructions are located on the last page of this paperwork.  If you have not heard from [place] regarding the results in 2 weeks, please contact this office.         Edwina Barth, MD [facility]

## 2020-02-24 NOTE — Patient Instructions (Addendum)
Increase Metformin to 1000 mg twice a day. Start glipizide 5 mg twice a day with meals. Follow-up in 3 months. Diabetes Mellitus and Nutrition, Adult When you have diabetes (diabetes mellitus), it is very important to have healthy eating habits because your blood sugar (glucose) levels are greatly affected by what you eat and drink. Eating healthy foods in the appropriate amounts, at about the same times every day, can help you:  Control your blood glucose.  Lower your risk of heart disease.  Improve your blood pressure.  Reach or maintain a healthy weight. Every person with diabetes is different, and each person has different needs for a meal plan. Your health care provider may recommend that you work with a diet and nutrition specialist (dietitian) to make a meal plan that is best for you. Your meal plan may vary depending on factors such as:  The calories you need.  The medicines you take.  Your weight.  Your blood glucose, blood pressure, and cholesterol levels.  Your activity level.  Other health conditions you have, such as heart or kidney disease. How do carbohydrates affect me? Carbohydrates, also called carbs, affect your blood glucose level more than any other type of food. Eating carbs naturally raises the amount of glucose in your blood. Carb counting is a method for keeping track of how many carbs you eat. Counting carbs is important to keep your blood glucose at a healthy level, especially if you use insulin or take certain oral diabetes medicines. It is important to know how many carbs you can safely have in each meal. This is different for every person. Your dietitian can help you calculate how many carbs you should have at each meal and for each snack. Foods that contain carbs include:  Bread, cereal, rice, pasta, and crackers.  Potatoes and corn.  Peas, beans, and lentils.  Milk and yogurt.  Fruit and juice.  Desserts, such as cakes, cookies, ice cream, and  candy. How does alcohol affect me? Alcohol can cause a sudden decrease in blood glucose (hypoglycemia), especially if you use insulin or take certain oral diabetes medicines. Hypoglycemia can be a life-threatening condition. Symptoms of hypoglycemia (sleepiness, dizziness, and confusion) are similar to symptoms of having too much alcohol. If your health care provider says that alcohol is safe for you, follow these guidelines:  Limit alcohol intake to no more than 1 drink per day for nonpregnant women and 2 drinks per day for men. One drink equals 12 oz of beer, 5 oz of wine, or 1 oz of hard liquor.  Do not drink on an empty stomach.  Keep yourself hydrated with water, diet soda, or unsweetened iced tea.  Keep in mind that regular soda, juice, and other mixers may contain a lot of sugar and must be counted as carbs. What are tips for following this plan?  Reading food labels  Start by checking the serving size on the "Nutrition Facts" label of packaged foods and drinks. The amount of calories, carbs, fats, and other nutrients listed on the label is based on one serving of the item. Many items contain more than one serving per package.  Check the total grams (g) of carbs in one serving. You can calculate the number of servings of carbs in one serving by dividing the total carbs by 15. For example, if a food has 30 g of total carbs, it would be equal to 2 servings of carbs.  Check the number of grams (g) of saturated and  trans fats in one serving. Choose foods that have low or no amount of these fats.  Check the number of milligrams (mg) of salt (sodium) in one serving. Most people should limit total sodium intake to less than 2,300 mg per day.  Always check the nutrition information of foods labeled as "low-fat" or "nonfat". These foods may be higher in added sugar or refined carbs and should be avoided.  Talk to your dietitian to identify your daily goals for nutrients listed on the  label. Shopping  Avoid buying canned, premade, or processed foods. These foods tend to be high in fat, sodium, and added sugar.  Shop around the outside edge of the grocery store. This includes fresh fruits and vegetables, bulk grains, fresh meats, and fresh dairy. Cooking  Use low-heat cooking methods, such as baking, instead of high-heat cooking methods like deep frying.  Cook using healthy oils, such as olive, canola, or sunflower oil.  Avoid cooking with butter, cream, or high-fat meats. Meal planning  Eat meals and snacks regularly, preferably at the same times every day. Avoid going long periods of time without eating.  Eat foods high in fiber, such as fresh fruits, vegetables, beans, and whole grains. Talk to your dietitian about how many servings of carbs you can eat at each meal.  Eat 4-6 ounces (oz) of lean protein each day, such as lean meat, chicken, fish, eggs, or tofu. One oz of lean protein is equal to: ? 1 oz of meat, chicken, or fish. ? 1 egg. ?  cup of tofu.  Eat some foods each day that contain healthy fats, such as avocado, nuts, seeds, and fish. Lifestyle  Check your blood glucose regularly.  Exercise regularly as told by your health care provider. This may include: ? 150 minutes of moderate-intensity or vigorous-intensity exercise each week. This could be brisk walking, biking, or water aerobics. ? Stretching and doing strength exercises, such as yoga or weightlifting, at least 2 times a week.  Take medicines as told by your health care provider.  Do not use any products that contain nicotine or tobacco, such as cigarettes and e-cigarettes. If you need help quitting, ask your health care provider.  Work with a Veterinary surgeon or diabetes educator to identify strategies to manage stress and any emotional and social challenges. Questions to ask a health care provider  Do I need to meet with a diabetes educator?  Do I need to meet with a dietitian?  What  number can I call if I have questions?  When are the best times to check my blood glucose? Where to find more information:  American Diabetes Association: diabetes.org  Academy of Nutrition and Dietetics: www.eatright.[employer] of Diabetes and Digestive and Kidney Diseases (NIH): CarFlippers.tn Summary  A healthy meal plan will help you control your blood glucose and maintain a healthy lifestyle.  Working with a diet and nutrition specialist (dietitian) can help you make a meal plan that is best for you.  Keep in mind that carbohydrates (carbs) and alcohol have immediate effects on your blood glucose levels. It is important to count carbs and to use alcohol carefully. This information is not intended to replace advice given to you by your health care provider. Make sure you discuss any questions you have with your health care provider. Document Revised: 03/08/2017 Document Reviewed: 04/30/2016 Elsevier Patient Education  [employer].     If you have lab work done today you will be contacted with your lab  results within the next 2 weeks.  If you have not heard from [place] then please contact us. The fastest way to get your results is to register for My Chart.   IF you received an x-ray today, you will receive an invoice from [facility]. Please contact [facility] at [phone] with questions or concerns regarding your invoice.   IF you received labwork today, you will receive an invoice from [place]. Please contact [facility] at [phone] with questions or concerns regarding your invoice.   Our billing staff will not be able to assist you with questions regarding bills from these companies.  You will be contacted with the lab results as soon as they are available. The fastest way to get your results is to activate your My Chart account. Instructions are located on the last page of this paperwork. If you have not heard from [place] regarding the  results in 2 weeks, please contact this office.

## 2020-05-11 ENCOUNTER — Other Ambulatory Visit: Payer: Self-pay | Admitting: Emergency Medicine

## 2020-05-11 DIAGNOSIS — I10 Essential (primary) hypertension: Secondary | ICD-10-CM

## 2020-05-11 NOTE — Telephone Encounter (Signed)
Requested Prescriptions  Pending Prescriptions Disp Refills  . atenolol-chlorthalidone (TENORETIC) 50-25 MG tablet [Pharmacy Med Name: ATENOLOL/CHLORTHALIDONE 50/25 TABS] 90 tablet 0    Sig: TAKE 1 TABLET BY MOUTH DAILY     Cardiovascular: Beta Blocker + Diuretic Combos Passed - 05/11/2020  3:49 AM      Passed - K in normal range and within 180 days    Potassium  Date Value Ref Range Status  11/25/2019 3.9 3.5 - 5.2 mmol/L Final         Passed - Na in normal range and within 180 days    Sodium  Date Value Ref Range Status  11/25/2019 140 134 - 144 mmol/L Final         Passed - Cr in normal range and within 180 days    Creat  Date Value Ref Range Status  11/19/2015 1.27 0.70 - 1.33 mg/dL Final    Comment:      For patients > or = 64 years of age: The upper reference limit for Creatinine is approximately 13% higher for people identified as African-American.      Creatinine, Ser  Date Value Ref Range Status  11/25/2019 1.18 0.76 - 1.27 mg/dL Final         Passed - Ca in normal range and within 180 days    Calcium  Date Value Ref Range Status  11/25/2019 9.4 8.6 - 10.2 mg/dL Final         Passed - Patient is not pregnant      Passed - Last BP in normal range    BP Readings from Last 1 Encounters:  02/24/20 132/72         Passed - Last Heart Rate in normal range    Pulse Readings from Last 1 Encounters:  02/24/20 67         Passed - Valid encounter within last 6 months    Recent Outpatient Visits          2 months ago Hypertension associated with type 2 diabetes mellitus (HCC)   [facility] at [place], [place], MD   5 months ago Hypertension associated with type 2 diabetes mellitus [facility])   Primary [facility] at [place], [place], MD   8 months ago Hypertension associated with type 2 diabetes mellitus [facility])   Primary [facility] at [place], [place], MD   1 year ago Hypertension associated with type 2 diabetes mellitus [facility])   Primary Care  at [facility], [place], MD   1 year ago Hypertension associated with type 2 diabetes mellitus [facility])   Primary [facility] at [facility], Eilleen Kempf, MD      Future Appointments            In 2 weeks Sagardia, Eilleen Kempf, MD Primary Care at [place], [facility]

## 2020-05-15 ENCOUNTER — Ambulatory Visit
Admission: EM | Admit: 2020-05-15 | Discharge: 2020-05-15 | Disposition: A | Discharge status: Home / Self Care | Payer: Self-pay

## 2020-05-15 ENCOUNTER — Ambulatory Visit (INDEPENDENT_AMBULATORY_CARE_PROVIDER_SITE_OTHER): Payer: Self-pay

## 2020-05-15 ENCOUNTER — Other Ambulatory Visit: Payer: Self-pay

## 2020-05-15 ENCOUNTER — Encounter: Payer: Self-pay | Admitting: *Deleted

## 2020-05-15 DIAGNOSIS — M25541 Pain in joints of right hand: Secondary | ICD-10-CM

## 2020-05-15 DIAGNOSIS — W19XXXA Unspecified fall, initial encounter: Secondary | ICD-10-CM

## 2020-05-15 DIAGNOSIS — S60221A Contusion of right hand, initial encounter: Secondary | ICD-10-CM

## 2020-05-15 DIAGNOSIS — S76111A Strain of right quadriceps muscle, fascia and tendon, initial encounter: Secondary | ICD-10-CM

## 2020-05-15 HISTORY — DX: Hyperlipidemia, unspecified: E78.5

## 2020-05-15 MED ORDER — NAPROXEN 500 MG PO TABS: 500.0000 mg | ORAL_TABLET | Freq: Two times a day (BID) | ORAL | 0 refills | Status: DC

## 2020-05-15 MED ORDER — TIZANIDINE HCL 4 MG PO TABS
2.0000 mg | ORAL_TABLET | Freq: Four times a day (QID) | ORAL | 0 refills | Status: DC | PRN
Start: 2020-05-15 — End: 2021-12-04

## 2020-05-15 NOTE — ED Provider Notes (Signed)
[facility]    CSN: [id number] Arrival date & time: 05/15/20  0843      History   Chief Complaint Chief Complaint  Patient presents with  . Fall  . Leg Pain  . Hand Pain    HPI Larry Dawson is a 65 y.o. male history of hypertension, COPD, DM type II, presenting today for evaluation of right hand and leg injury after fall. Reports that he slipped in the mud yesterday and fell on outstretched hand and caught himself with his right hand. He since has developed pain and swelling in his hand mainly near his fourth and fifth fingers. Denies any numbness or tingling. Denies any wrist pain elbow pain or shoulder pain. Denies hitting head or loss of consciousness with fall. He also reports pain to distal medial aspect of right thigh and lateral proximal aspect of right thigh. He does report incident 3 days ago while working where he planted and twisted his right knee knee slightly and has been slightly sore since. Reports areas and thighs spasming with walking, most comfortable with leg in full extension, pain increases with bending.  HPI  Past Medical History:  Diagnosis Date  . Allergy   . Anal fissure   . COPD (chronic obstructive pulmonary disease) (HCC)    Albuterol PRN.  Marland Kitchen Hydrocele   . Hyperlipidemia   . Hypertension   . IBS (irritable bowel syndrome) 05/22/13   per chart note-pt denied    Patient Active Problem List   Diagnosis Date Noted  . Type 2 diabetes mellitus with hyperglycemia, without long-term current use of insulin (HCC) 08/06/2018  . Dyslipidemia 08/06/2018  . Hypertension associated with type 2 diabetes mellitus (HCC) 07/18/2011    Past Surgical History:  Procedure Laterality Date  . HERNIA REPAIR  prior to 2013  . HYDROCELE EXCISION Bilateral 11/23/2013   Procedure: HYDROCELECTOMY ADULT;  Surgeon: Valetta Fuller, MD;  Location: [facility];  Service: Urology;  Laterality: Bilateral;  . VASECTOMY  prior to 2013       Home  Medications    Prior to Admission medications   Medication Sig Start Date End Date Taking? Authorizing Provider  Ascorbic Acid (VITAMIN C PO) Take by mouth.   Yes [provider]  aspirin 81 MG tablet Take 81 mg by mouth daily.   Yes [provider]  atenolol-chlorthalidone (TENORETIC) 50-25 MG tablet TAKE 1 TABLET BY MOUTH DAILY 05/11/20 08/09/20 Yes Sagardia, Eilleen Kempf, MD  cetirizine (ZYRTEC) 10 MG tablet Take 1 tablet (10 mg total) by mouth daily. 05/10/17  Yes Linus Mako B, NP  cholecalciferol (VITAMIN D) 1000 units tablet Take 1,000 Units by mouth daily.   Yes [provider]  glipiZIDE (GLUCOTROL) 5 MG tablet Take 1 tablet (5 mg total) by mouth 2 (two) times daily with a meal. 02/24/20 05/24/20 Yes Sagardia, Eilleen Kempf, MD  metFORMIN (GLUCOPHAGE) 1000 MG tablet Take 1 tablet (1,000 mg total) by mouth 2 (two) times daily with a meal. 02/24/20  Yes Sagardia, Eilleen Kempf, MD  Multiple Vitamins-Minerals (ZINC PO) Take by mouth.   Yes [provider]  naproxen (NAPROSYN) 500 MG tablet Take 1 tablet (500 mg total) by mouth 2 (two) times daily. 05/15/20  Yes Carlethia Mesquita C, PA-C  Omega-3 Fatty Acids (FISH OIL) 1000 MG CAPS Take by mouth.   Yes [provider]  omeprazole (PRILOSEC) 20 MG capsule Take 20 mg by mouth daily.   Yes [provider]  rosuvastatin (CRESTOR)  20 MG tablet Take 1 tablet (20 mg total) by mouth daily. 08/26/19  Yes Sagardia, Eilleen Kempf, MD  tiZANidine (ZANAFLEX) 4 MG tablet Take 0.5-1 tablets (2-4 mg total) by mouth every 6 (six) hours as needed for muscle spasms. 05/15/20  Yes Tayana Shankle C, PA-C  Turmeric POWD by Does not apply route daily.   Yes [provider]    Family History Family History  Problem Relation Age of Onset  . Diabetes Father   . Hypertension Father   . Diabetes Brother   . Hypertension Brother   . Diabetes Brother   . Diabetes Brother     Social History Social History    Tobacco Use  . Smoking status: Former Smoker    Types: Cigarettes  . Smokeless tobacco: Never Used  Vaping Use  . Vaping Use: Never used  Substance Use Topics  . Alcohol use: Yes    Comment: rarely  . Drug use: No     Allergies   Ace inhibitors, Penicillins, and Latex   Review of Systems Review of Systems  Constitutional: Negative for activity change, chills, diaphoresis and fatigue.  HENT: Negative for ear pain, tinnitus and trouble swallowing.   Eyes: Negative for photophobia and visual disturbance.  Respiratory: Negative for cough, chest tightness and shortness of breath.   Cardiovascular: Negative for chest pain and leg swelling.  Gastrointestinal: Negative for abdominal pain, blood in stool, nausea and vomiting.  Musculoskeletal: Positive for arthralgias, joint swelling and myalgias. Negative for back pain, gait problem, neck pain and neck stiffness.  Skin: Negative for color change and wound.  Neurological: Negative for dizziness, weakness, light-headedness, numbness and headaches.     Physical Exam Triage Vital Signs ED Triage Vitals  Enc Vitals Group     BP      Pulse      Resp      Temp      Temp src      SpO2      Weight      Height      Head Circumference      Peak Flow      Pain Score      Pain Loc      Pain Edu?      Excl. in GC?    No data found.  Updated Vital Signs BP 119/82   Pulse 84   Temp 98.1 F (36.7 C) (Temporal)   Resp 18   SpO2 95%   Visual Acuity Right Eye Distance:   Left Eye Distance:   Bilateral Distance:    Right Eye Near:   Left Eye Near:    Bilateral Near:     Physical Exam Vitals and nursing note reviewed.  Constitutional:      Appearance: He is well-developed and well-nourished.     Comments: No acute distress  HENT:     Head: Normocephalic and atraumatic.     Nose: Nose normal.  Eyes:     Conjunctiva/sclera: Conjunctivae normal.  Cardiovascular:     Rate and Rhythm: Normal rate.  Pulmonary:      Effort: Pulmonary effort is normal. No respiratory distress.  Abdominal:     General: There is no distension.  Musculoskeletal:        General: Normal range of motion.     Cervical back: Neck supple.     Comments: Right hand: Moderate swelling noted over distal fourth and fifth metacarpals, tenderness to palpation over this area, nontender to proximal middle and distal  phalanx of fourth and fifth fingers, nontender distal radius and ulna, radial pulse 2+, sensation intact distally  Right upper leg: Tenderness to palpation to lateral proximal thigh, medial distal thigh  Right knee: No obvious swelling or deformity, nontender to palpation of her patella or medial lateral joint line, no popliteal tenderness, slightly limited range of motion with flexion and and range of motion slowed due to discomfort, reports increased pain within thigh with this  Ambulating with mild antalgia  Skin:    General: Skin is warm and dry.     Comments: No wounds rashes or discoloration noted to hand or right leg  Neurological:     Mental Status: He is alert and oriented to person, place, and time.  Psychiatric:        Mood and Affect: Mood and affect normal.      UC Treatments / Results  Labs (all labs ordered are listed, but only abnormal results are displayed) Labs Reviewed - No data to display  EKG   Radiology DG Hand Complete Right  Result Date: 05/15/2020 CLINICAL DATA:  Acute right hand pain after fall. EXAM: RIGHT HAND - COMPLETE 3+ VIEW COMPARISON:  None. FINDINGS: There is no evidence of fracture or dislocation. There is no evidence of arthropathy or other focal bone abnormality. Soft tissues are unremarkable. IMPRESSION: Negative. Electronically Signed   By: Lupita Raider M.D.   On: 05/15/2020 09:40    Procedures Procedures (including critical care time)  Medications Ordered in UC Medications - No data to display  Initial Impression / Assessment and Plan / UC Course  I have reviewed  the triage vital signs and the nursing notes.  Pertinent labs & imaging results that were available during my care of the patient were reviewed by me and considered in my medical decision making (see chart for details).     1.  Right hand pain-x-ray unremarkable, treating as contusion/sprain, Ace wrap applied, anti-inflammatories and ice  2.  Thigh pain-suspect likely straining of muscles and has reported spasming, continue anti-inflammatories and muscle relaxers, monitor for gradual resolution  Recommended following up with sports medicine if any symptoms not improving or worsening Final Clinical Impressions(s) / UC Diagnoses   Final diagnoses:  Contusion of right hand, initial encounter  Strain of right quadriceps, initial encounter     Discharge Instructions     And x-ray normal, no signs of fractures May wear Ace wrap to help with compression and swelling Naprosyn twice daily for pain and swelling in hand and leg May supplement with tizanidine at home/bedtime for spasming in leg May alternate ice and heat to leg, ice hand Follow-up with sports medicine if symptoms not improving or worsening    ED Prescriptions    Medication Sig Dispense Auth. Provider   tiZANidine (ZANAFLEX) 4 MG tablet Take 0.5-1 tablets (2-4 mg total) by mouth every 6 (six) hours as needed for muscle spasms. 30 tablet Kerianna Rawlinson C, PA-C   naproxen (NAPROSYN) 500 MG tablet Take 1 tablet (500 mg total) by mouth 2 (two) times daily. 30 tablet Tynlee Bayle, [place] C, PA-C     PDMP not reviewed this encounter.   Lew Dawes, [place] 05/15/20 [id number]

## 2020-05-15 NOTE — ED Triage Notes (Signed)
Reports slipping in mud yesterday, stating he felt like RLE "twisted".  C/O pain to distal medial right thigh and proximal lateral left thigh.  Denies any knee pain.  Also c/o swelling and pain to right hand.  RUE and RLE CMS intact.

## 2020-05-15 NOTE — Discharge Instructions (Addendum)
And x-ray normal, no signs of fractures May wear Ace wrap to help with compression and swelling Naprosyn twice daily for pain and swelling in hand and leg May supplement with tizanidine at home/bedtime for spasming in leg May alternate ice and heat to leg, ice hand Follow-up with sports medicine if symptoms not improving or worsening

## 2020-05-25 ENCOUNTER — Ambulatory Visit (INDEPENDENT_AMBULATORY_CARE_PROVIDER_SITE_OTHER): Payer: Self-pay | Admitting: Emergency Medicine

## 2020-05-25 ENCOUNTER — Encounter: Payer: Self-pay | Admitting: Emergency Medicine

## 2020-05-25 ENCOUNTER — Other Ambulatory Visit: Payer: Self-pay

## 2020-05-25 VITALS — BP 125/69 | HR 56 | Temp 98.5°F | Resp 16 | Ht 68.0 in | Wt 227.0 lb

## 2020-05-25 DIAGNOSIS — Z23 Encounter for immunization: Secondary | ICD-10-CM

## 2020-05-25 DIAGNOSIS — E1159 Type 2 diabetes mellitus with other circulatory complications: Secondary | ICD-10-CM

## 2020-05-25 DIAGNOSIS — I152 Hypertension secondary to endocrine disorders: Secondary | ICD-10-CM

## 2020-05-25 DIAGNOSIS — E1169 Type 2 diabetes mellitus with other specified complication: Secondary | ICD-10-CM

## 2020-05-25 DIAGNOSIS — E785 Hyperlipidemia, unspecified: Secondary | ICD-10-CM

## 2020-05-25 LAB — COMPREHENSIVE METABOLIC PANEL
ALT: 22 IU/L (ref 0–44)
AST: 26 IU/L (ref 0–40)
Albumin/Globulin Ratio: 1.5 (ref 1.2–2.2)
Albumin: 4.4 g/dL (ref 3.8–4.8)
Alkaline Phosphatase: 63 IU/L (ref 44–121)
BUN/Creatinine Ratio: 16 (ref 10–24)
BUN: 20 mg/dL (ref 8–27)
Bilirubin Total: 1.4 mg/dL — ABNORMAL HIGH (ref 0.0–1.2)
CO2: 27 mmol/L (ref 20–29)
Calcium: 9.5 mg/dL (ref 8.6–10.2)
Chloride: 97 mmol/L (ref 96–106)
Creatinine, Ser: 1.27 mg/dL (ref 0.76–1.27)
GFR calc Af Amer: 69 mL/min/{1.73_m2} (ref >59)
GFR calc non Af Amer: 60 mL/min/{1.73_m2} (ref >59)
Globulin, Total: 2.9 g/dL (ref 1.5–4.5)
Glucose: 161 mg/dL — ABNORMAL HIGH (ref 65–99)
Potassium: 3.4 mmol/L — ABNORMAL LOW (ref 3.5–5.2)
Sodium: 139 mmol/L (ref 134–144)
Total Protein: 7.3 g/dL (ref 6.0–8.5)

## 2020-05-25 LAB — POCT GLYCOSYLATED HEMOGLOBIN (HGB A1C): Hemoglobin A1C: 7.1 % — AB (ref 4.0–5.6)

## 2020-05-25 LAB — GLUCOSE, POCT (MANUAL RESULT ENTRY): POC Glucose: 166 mg/dl — AB (ref 70–99)

## 2020-05-25 NOTE — Progress Notes (Signed)
Larry Dawson 65 y.o.   Chief Complaint  Patient presents with  . Diabetes    Follow up 3 month     HISTORY OF PRESENT ILLNESS: This is a 65 y.o. male with history of diabetes and hypertension here for follow-up. #1 diabetes: On Metformin 1000 mg twice a day and glipizide 5 mg twice a day. #2 hypertension: On Tenoretic 50-25 mg daily. #3 dyslipidemia: On rosuvastatin 20 mg daily.  Also takes 1 baby aspirins daily. Fully vaccinated against COVID with a booster. Up-to-date with colonoscopy. Doing well.  Has no complaints or medical concerns today.  Lab Results  Component Value Date   HGBA1C 7.3 (A) 02/24/2020    HPI   Prior to Admission medications   Medication Sig Start Date End Date Taking? Authorizing Provider  Ascorbic Acid (VITAMIN C PO) Take by mouth.   Yes [provider]  aspirin 81 MG tablet Take 81 mg by mouth daily.   Yes [provider]  atenolol-chlorthalidone (TENORETIC) 50-25 MG tablet TAKE 1 TABLET BY MOUTH DAILY 05/11/20 08/09/20 Yes Tonda Wiederhold, Eilleen Kempf, MD  cetirizine (ZYRTEC) 10 MG tablet Take 1 tablet (10 mg total) by mouth daily. 05/10/17  Yes Linus Mako B, NP  cholecalciferol (VITAMIN D) 1000 units tablet Take 1,000 Units by mouth daily.   Yes [provider]  metFORMIN (GLUCOPHAGE) 1000 MG tablet Take 1 tablet (1,000 mg total) by mouth 2 (two) times daily with a meal. 02/24/20  Yes Daelen Belvedere, Eilleen Kempf, MD  Multiple Vitamins-Minerals (ZINC PO) Take by mouth.   Yes [provider]  naproxen (NAPROSYN) 500 MG tablet Take 1 tablet (500 mg total) by mouth 2 (two) times daily. 05/15/20  Yes Wieters, Hallie C, PA-C  Omega-3 Fatty Acids (FISH OIL) 1000 MG CAPS Take by mouth.   Yes [provider]  omeprazole (PRILOSEC) 20 MG capsule Take 20 mg by mouth daily.   Yes [provider]  pravastatin (PRAVACHOL) 20 MG tablet Take 20 mg by mouth daily.   Yes [provider]  rosuvastatin (CRESTOR) 20 MG  tablet Take 1 tablet (20 mg total) by mouth daily. 08/26/19  Yes Neda Willenbring, Eilleen Kempf, MD  tiZANidine (ZANAFLEX) 4 MG tablet Take 0.5-1 tablets (2-4 mg total) by mouth every 6 (six) hours as needed for muscle spasms. 05/15/20  Yes Wieters, Hallie C, PA-C  Turmeric POWD by Does not apply route daily.   Yes [provider]  glipiZIDE (GLUCOTROL) 5 MG tablet Take 1 tablet (5 mg total) by mouth 2 (two) times daily with a meal. 02/24/20 05/24/20  Georgina Quint, MD    Allergies  Allergen Reactions  . Ace Inhibitors Swelling    Suspected angioedema  . Penicillins Hives, Rash and Other (See Comments)    Throat swells  . Latex Hives, Swelling and Rash    Patient Active Problem List   Diagnosis Date Noted  . Type 2 diabetes mellitus with hyperglycemia, without long-term current use of insulin (HCC) 08/06/2018  . Dyslipidemia 08/06/2018  . Hypertension associated with type 2 diabetes mellitus (HCC) 07/18/2011    Past Medical History:  Diagnosis Date  . Allergy   . Anal fissure   . COPD (chronic obstructive pulmonary disease) (HCC)    Albuterol PRN.  Marland Kitchen Hydrocele   . Hyperlipidemia   . Hypertension   . IBS (irritable bowel syndrome) 05/22/13   per chart note-pt denied    Past Surgical History:  Procedure Laterality Date  . HERNIA REPAIR  prior to  2013  . HYDROCELE EXCISION Bilateral 11/23/2013   Procedure: HYDROCELECTOMY ADULT;  Surgeon: Valetta Fuller, MD;  Location: [facility];  Service: Urology;  Laterality: Bilateral;  . VASECTOMY  prior to 2013    Social History   Socioeconomic History  . Marital status: Married    Spouse name: Not on file  . Number of children: Not on file  . Years of education: Not on file  . Highest education level: Not on file  Occupational History  . Not on file  Tobacco Use  . Smoking status: Former Smoker    Types: Cigarettes  . Smokeless tobacco: Never Used  Vaping Use  . Vaping Use: Never used  Substance and  Sexual Activity  . Alcohol use: Yes    Comment: rarely  . Drug use: No  . Sexual activity: Not on file  Other Topics Concern  . Not on file  Social History Narrative   Marital status: married      Employment: [employer]; drives all over.  Owns company.      Tobacco:  Quit smoking in 2005; smoked 25 years      Alcohol:  Rare beer      Drugs:  None      Exercise:  None   Daily caffeine    Social Determinants of Health   Financial Resource Strain: Not on file  Food Insecurity: Not on file  Transportation Needs: Not on file  Physical Activity: Not on file  Stress: Not on file  Social Connections: Not on file  Intimate Partner Violence: Not on file    Family History  Problem Relation Age of Onset  . Diabetes Father   . Hypertension Father   . Diabetes Brother   . Hypertension Brother   . Diabetes Brother   . Diabetes Brother      Review of Systems  Constitutional: Negative.  Negative for chills and fever.  HENT: Negative.  Negative for congestion and sore throat.   Respiratory: Negative.  Negative for cough and shortness of breath.   Cardiovascular: Negative.  Negative for chest pain and palpitations.  Gastrointestinal: Negative.  Negative for abdominal pain, diarrhea, nausea and vomiting.  Genitourinary: Negative.  Negative for dysuria and hematuria.  Skin: Negative.  Negative for rash.  Neurological: Negative.  Negative for dizziness and headaches.  All other systems reviewed and are negative.   Today's Vitals   05/25/20 1033  BP: 125/69  Pulse: (!) 56  Resp: 16  Temp: 98.5 F (36.9 C)  TempSrc: Temporal  SpO2: 98%  Weight: 227 lb (103 kg)  Height: 5\' 8"  (1.727 m)   Body mass index is 34.52 kg/m. Wt Readings from Last 3 Encounters:  05/25/20 227 lb (103 kg)  02/24/20 233 lb (105.7 kg)  11/25/19 235 lb 12.8 oz (107 kg)    Physical Exam Vitals reviewed.  Constitutional:      Appearance: Normal appearance.  HENT:     Head: Normocephalic.   Eyes:     Extraocular Movements: Extraocular movements intact.     Pupils: Pupils are equal, round, and reactive to light.  Cardiovascular:     Rate and Rhythm: Normal rate and regular rhythm.     Pulses: Normal pulses.     Heart sounds: Normal heart sounds.  Pulmonary:     Effort: Pulmonary effort is normal.     Breath sounds: Normal breath sounds.  Musculoskeletal:        General: Normal range of motion.  Cervical back: Normal range of motion and neck supple.  Skin:    General: Skin is warm and dry.     Capillary Refill: Capillary refill takes less than 2 seconds.  Neurological:     General: No focal deficit present.     Mental Status: He is alert and oriented to person, place, and time.  Psychiatric:        Mood and Affect: Mood normal.        Behavior: Behavior normal.    Results for orders placed or performed in visit on 05/25/20 (from the past 24 hour(s))  POCT glucose (manual entry)     Status: Abnormal   Collection Time: 05/25/20 10:55 AM  Result Value Ref Range   POC Glucose 166 (A) 70 - 99 mg/dl  POCT glycosylated hemoglobin (Hb A1C)     Status: Abnormal   Collection Time: 05/25/20 11:04 AM  Result Value Ref Range   Hemoglobin A1C 7.1 (A) 4.0 - 5.6 %   HbA1c POC (<> result, manual entry)     HbA1c, POC (prediabetic range)     HbA1c, POC (controlled diabetic range)     A total of 30 minutes was spent with the patient, greater than 50% of which was in counseling/coordination of care regarding diabetes and hypertension and cardiovascular risks associated with these conditions, review of all medications, review of most recent blood work results including today's hemoglobin A1c review of most recent office visit notes, education on nutrition, health maintenance items, anticipatory guidance, prognosis, documentation, and need for follow-up.   ASSESSMENT & PLAN: Hypertension associated with type 2 diabetes mellitus (HCC) Well-controlled hypertension. Continue  Tenoretic 50-25 mg daily. Hemoglobin A1c at 7.1, better than before. Continue Metformin and glipizide twice a day. Diet and nutrition discussed. Continue rosuvastatin 20 mg daily. Follow-up in 3 months.  Aries was seen today for diabetes.  Diagnoses and all orders for this visit:  Hypertension associated with type 2 diabetes mellitus (HCC) -     POCT glucose (manual entry) -     POCT glycosylated hemoglobin (Hb A1C) -     Comprehensive metabolic panel -     Ambulatory referral to Ophthalmology -     Microalbumin, urine  Dyslipidemia associated with type 2 diabetes mellitus (HCC)  Need for prophylactic vaccination against Streptococcus pneumoniae (pneumococcus) -     Pneumococcal polysaccharide vaccine 23-valent greater than or equal to 2yo subcutaneous/IM    Patient Instructions       If you have lab work done today you will be contacted with your lab results within the next 2 weeks.  If you have not heard from [place] then please contact us. The fastest way to get your results is to register for My Chart.   IF you received an x-ray today, you will receive an invoice from [facility]. Please contact [facility] at [phone] with questions or concerns regarding your invoice.   IF you received labwork today, you will receive an invoice from [place]. Please contact [facility] at [phone] with questions or concerns regarding your invoice.   Our billing staff will not be able to assist you with questions regarding bills from these companies.  You will be contacted with the lab results as soon as they are available. The fastest way to get your results is to activate your My Chart account. Instructions are located on the last page of this paperwork. If you have not heard from [place] regarding the results in 2 weeks, please contact this office.  Diabetes Mellitus and Nutrition, Adult When you have diabetes, or diabetes mellitus, it is very important to have healthy  eating habits because your blood sugar (glucose) levels are greatly affected by what you eat and drink. Eating healthy foods in the right amounts, at about the same times every day, can help you:  Control your blood glucose.  Lower your risk of heart disease.  Improve your blood pressure.  Reach or maintain a healthy weight. What can affect my meal plan? Every person with diabetes is different, and each person has different needs for a meal plan. Your health care provider may recommend that you work with a dietitian to make a meal plan that is best for you. Your meal plan may vary depending on factors such as:  The calories you need.  The medicines you take.  Your weight.  Your blood glucose, blood pressure, and cholesterol levels.  Your activity level.  Other health conditions you have, such as heart or kidney disease. How do carbohydrates affect me? Carbohydrates, also called carbs, affect your blood glucose level more than any other type of food. Eating carbs naturally raises the amount of glucose in your blood. Carb counting is a method for keeping track of how many carbs you eat. Counting carbs is important to keep your blood glucose at a healthy level, especially if you use insulin or take certain oral diabetes medicines. It is important to know how many carbs you can safely have in each meal. This is different for every person. Your dietitian can help you calculate how many carbs you should have at each meal and for each snack. How does alcohol affect me? Alcohol can cause a sudden decrease in blood glucose (hypoglycemia), especially if you use insulin or take certain oral diabetes medicines. Hypoglycemia can be a life-threatening condition. Symptoms of hypoglycemia, such as sleepiness, dizziness, and confusion, are similar to symptoms of having too much alcohol.  Do not drink alcohol if: ? Your health care provider tells you not to drink. ? You are pregnant, may be pregnant, or  are planning to become pregnant.  If you drink alcohol: ? Do not drink on an empty stomach. ? Limit how much you use to:  0-1 drink a day for women.  0-2 drinks a day for men. ? Be aware of how much alcohol is in your drink. In the [place] one drink equals one 12 oz bottle of beer (355 mL), one 5 oz glass of wine (148 mL), or one 1 oz glass of hard liquor (44 mL). ? Keep yourself hydrated with water, diet soda, or unsweetened iced tea.  Keep in mind that regular soda, juice, and other mixers may contain a lot of sugar and must be counted as carbs. What are tips for following this plan? Reading food labels  Start by checking the serving size on the "Nutrition Facts" label of packaged foods and drinks. The amount of calories, carbs, fats, and other nutrients listed on the label is based on one serving of the item. Many items contain more than one serving per package.  Check the total grams (g) of carbs in one serving. You can calculate the number of servings of carbs in one serving by dividing the total carbs by 15. For example, if a food has 30 g of total carbs per serving, it would be equal to 2 servings of carbs.  Check the number of grams (g) of saturated fats and trans fats in one serving. Choose foods that  have a low amount or none of these fats.  Check the number of milligrams (mg) of salt (sodium) in one serving. Most people should limit total sodium intake to less than 2,300 mg per day.  Always check the nutrition information of foods labeled as "low-fat" or "nonfat." These foods may be higher in added sugar or refined carbs and should be avoided.  Talk to your dietitian to identify your daily goals for nutrients listed on the label. Shopping  Avoid buying canned, pre-made, or processed foods. These foods tend to be high in fat, sodium, and added sugar.  Shop around the outside edge of the grocery store. This is where you will most often find fresh fruits and vegetables, bulk  grains, fresh meats, and fresh dairy. Cooking  Use low-heat cooking methods, such as baking, instead of high-heat cooking methods like deep frying.  Cook using healthy oils, such as olive, canola, or sunflower oil.  Avoid cooking with butter, cream, or high-fat meats. Meal planning  Eat meals and snacks regularly, preferably at the same times every day. Avoid going long periods of time without eating.  Eat foods that are high in fiber, such as fresh fruits, vegetables, beans, and whole grains. Talk with your dietitian about how many servings of carbs you can eat at each meal.  Eat 4-6 oz (112-168 g) of lean protein each day, such as lean meat, chicken, fish, eggs, or tofu. One ounce (oz) of lean protein is equal to: ? 1 oz (28 g) of meat, chicken, or fish. ? 1 egg. ?  cup (62 g) of tofu.  Eat some foods each day that contain healthy fats, such as avocado, nuts, seeds, and fish.   What foods should I eat? Fruits Berries. Apples. Oranges. Peaches. Apricots. Plums. Grapes. Mango. Papaya. Pomegranate. Kiwi. Cherries. Vegetables Lettuce. Spinach. Leafy greens, including kale, chard, collard greens, and mustard greens. Beets. Cauliflower. Cabbage. Broccoli. Carrots. Green beans. Tomatoes. Peppers. Onions. Cucumbers. Brussels sprouts. Grains Whole grains, such as whole-wheat or whole-grain bread, crackers, tortillas, cereal, and pasta. Unsweetened oatmeal. Quinoa. Brown or wild rice. Meats and other proteins Seafood. Poultry without skin. Lean cuts of poultry and beef. Tofu. Nuts. Seeds. Dairy Low-fat or fat-free dairy products such as milk, yogurt, and cheese. The items listed above may not be a complete list of foods and beverages you can eat. Contact a dietitian for more information. What foods should I avoid? Fruits Fruits canned with syrup. Vegetables Canned vegetables. Frozen vegetables with butter or cream sauce. Grains Refined white flour and flour products such as bread,  pasta, snack foods, and cereals. Avoid all processed foods. Meats and other proteins Fatty cuts of meat. Poultry with skin. Breaded or fried meats. Processed meat. Avoid saturated fats. Dairy Full-fat yogurt, cheese, or milk. Beverages Sweetened drinks, such as soda or iced tea. The items listed above may not be a complete list of foods and beverages you should avoid. Contact a dietitian for more information. Questions to ask a health care provider  Do I need to meet with a diabetes educator?  Do I need to meet with a dietitian?  What number can I call if I have questions?  When are the best times to check my blood glucose? Where to find more information:  American Diabetes Association: diabetes.org  Academy of Nutrition and Dietetics: www.eatrighAK Steel Holding Corporationitute of Diabetes and Digestive and Kidney Diseases: CarFlippers.tn  Association of Diabetes Care and Education Specialists: www.diabeteseducator.org Summary  It is important to have healthy  eating habits because your blood sugar (glucose) levels are greatly affected by what you eat and drink.  A healthy meal plan will help you control your blood glucose and maintain a healthy lifestyle.  Your health care provider may recommend that you work with a dietitian to make a meal plan that is best for you.  Keep in mind that carbohydrates (carbs) and alcohol have immediate effects on your blood glucose levels. It is important to count carbs and to use alcohol carefully. This information is not intended to replace advice given to you by your health care provider. Make sure you discuss any questions you have with your health care provider. Document Revised: 03/03/2019 Document Reviewed: 03/03/2019 Elsevier Patient Education  2021 Elsevier Inc.      Edwina Barth, MD [facility]

## 2020-05-25 NOTE — Patient Instructions (Addendum)
   If you have lab work done today you will be contacted with your lab results within the next 2 weeks.  If you have not heard from us then please contact us. The fastest way to get your results is to register for My Chart.   IF you received an x-ray today, you will receive an invoice from [facility]. Please contact [facility] at [phone] with questions or concerns regarding your invoice.   IF you received labwork today, you will receive an invoice from [facility]. Please contact [facility] at [phone] with questions or concerns regarding your invoice.   Our billing staff will not be able to assist you with questions regarding bills from these companies.  You will be contacted with the lab results as soon as they are available. The fastest way to get your results is to activate your My Chart account. Instructions are located on the last page of this paperwork. If you have not heard from us regarding the results in 2 weeks, please contact this office.     Diabetes Mellitus and Nutrition, Adult When you have diabetes, or diabetes mellitus, it is very important to have healthy eating habits because your blood sugar (glucose) levels are greatly affected by what you eat and drink. Eating healthy foods in the right amounts, at about the same times every day, can help you:  Control your blood glucose.  Lower your risk of heart disease.  Improve your blood pressure.  Reach or maintain a healthy weight. What can affect my meal plan? Every person with diabetes is different, and each person has different needs for a meal plan. Your health care provider may recommend that you work with a dietitian to make a meal plan that is best for you. Your meal plan may vary depending on factors such as:  The calories you need.  The medicines you take.  Your weight.  Your blood glucose, blood pressure, and cholesterol levels.  Your activity level.  Other health conditions you  have, such as heart or kidney disease. How do carbohydrates affect me? Carbohydrates, also called carbs, affect your blood glucose level more than any other type of food. Eating carbs naturally raises the amount of glucose in your blood. Carb counting is a method for keeping track of how many carbs you eat. Counting carbs is important to keep your blood glucose at a healthy level, especially if you use insulin or take certain oral diabetes medicines. It is important to know how many carbs you can safely have in each meal. This is different for every person. Your dietitian can help you calculate how many carbs you should have at each meal and for each snack. How does alcohol affect me? Alcohol can cause a sudden decrease in blood glucose (hypoglycemia), especially if you use insulin or take certain oral diabetes medicines. Hypoglycemia can be a life-threatening condition. Symptoms of hypoglycemia, such as sleepiness, dizziness, and confusion, are similar to symptoms of having too much alcohol.  Do not drink alcohol if: ? Your health care provider tells you not to drink. ? You are pregnant, may be pregnant, or are planning to become pregnant.  If you drink alcohol: ? Do not drink on an empty stomach. ? Limit how much you use to:  0-1 drink a day for women.  0-2 drinks a day for men. ? Be aware of how much alcohol is in your drink. In the [place] one drink equals one 12 oz bottle of beer (355 mL),   one 5 oz glass of wine (148 mL), or one 1 oz glass of hard liquor (44 mL). ? Keep yourself hydrated with water, diet soda, or unsweetened iced tea.  Keep in mind that regular soda, juice, and other mixers may contain a lot of sugar and must be counted as carbs. What are tips for following this plan? Reading food labels  Start by checking the serving size on the "Nutrition Facts" label of packaged foods and drinks. The amount of calories, carbs, fats, and other nutrients listed on the label is based on  one serving of the item. Many items contain more than one serving per package.  Check the total grams (g) of carbs in one serving. You can calculate the number of servings of carbs in one serving by dividing the total carbs by 15. For example, if a food has 30 g of total carbs per serving, it would be equal to 2 servings of carbs.  Check the number of grams (g) of saturated fats and trans fats in one serving. Choose foods that have a low amount or none of these fats.  Check the number of milligrams (mg) of salt (sodium) in one serving. Most people should limit total sodium intake to less than 2,300 mg per day.  Always check the nutrition information of foods labeled as "low-fat" or "nonfat." These foods may be higher in added sugar or refined carbs and should be avoided.  Talk to your dietitian to identify your daily goals for nutrients listed on the label. Shopping  Avoid buying canned, pre-made, or processed foods. These foods tend to be high in fat, sodium, and added sugar.  Shop around the outside edge of the grocery store. This is where you will most often find fresh fruits and vegetables, bulk grains, fresh meats, and fresh dairy. Cooking  Use low-heat cooking methods, such as baking, instead of high-heat cooking methods like deep frying.  Cook using healthy oils, such as olive, canola, or sunflower oil.  Avoid cooking with butter, cream, or high-fat meats. Meal planning  Eat meals and snacks regularly, preferably at the same times every day. Avoid going long periods of time without eating.  Eat foods that are high in fiber, such as fresh fruits, vegetables, beans, and whole grains. Talk with your dietitian about how many servings of carbs you can eat at each meal.  Eat 4-6 oz (112-168 g) of lean protein each day, such as lean meat, chicken, fish, eggs, or tofu. One ounce (oz) of lean protein is equal to: ? 1 oz (28 g) of meat, chicken, or fish. ? 1 egg. ?  cup (62 g) of  tofu.  Eat some foods each day that contain healthy fats, such as avocado, nuts, seeds, and fish.   What foods should I eat? Fruits Berries. Apples. Oranges. Peaches. Apricots. Plums. Grapes. Mango. Papaya. Pomegranate. Kiwi. Cherries. Vegetables Lettuce. Spinach. Leafy greens, including kale, chard, collard greens, and mustard greens. Beets. Cauliflower. Cabbage. Broccoli. Carrots. Green beans. Tomatoes. Peppers. Onions. Cucumbers. Brussels sprouts. Grains Whole grains, such as whole-wheat or whole-grain bread, crackers, tortillas, cereal, and pasta. Unsweetened oatmeal. Quinoa. Brown or wild rice. Meats and other proteins Seafood. Poultry without skin. Lean cuts of poultry and beef. Tofu. Nuts. Seeds. Dairy Low-fat or fat-free dairy products such as milk, yogurt, and cheese. The items listed above may not be a complete list of foods and beverages you can eat. Contact a dietitian for more information. What foods should I avoid? Fruits Fruits canned   with syrup. Vegetables Canned vegetables. Frozen vegetables with butter or cream sauce. Grains Refined white flour and flour products such as bread, pasta, snack foods, and cereals. Avoid all processed foods. Meats and other proteins Fatty cuts of meat. Poultry with skin. Breaded or fried meats. Processed meat. Avoid saturated fats. Dairy Full-fat yogurt, cheese, or milk. Beverages Sweetened drinks, such as soda or iced tea. The items listed above may not be a complete list of foods and beverages you should avoid. Contact a dietitian for more information. Questions to ask a health care provider  Do I need to meet with a diabetes educator?  Do I need to meet with a dietitian?  What number can I call if I have questions?  When are the best times to check my blood glucose? Where to find more information:  American Diabetes Association: diabetes.org  Academy of Nutrition and Dietetics: www.eatright.org  National Institute of  Diabetes and Digestive and Kidney Diseases: www.niddk.nih.gov  Association of Diabetes Care and Education Specialists: www.diabeteseducator.org Summary  It is important to have healthy eating habits because your blood sugar (glucose) levels are greatly affected by what you eat and drink.  A healthy meal plan will help you control your blood glucose and maintain a healthy lifestyle.  Your health care provider may recommend that you work with a dietitian to make a meal plan that is best for you.  Keep in mind that carbohydrates (carbs) and alcohol have immediate effects on your blood glucose levels. It is important to count carbs and to use alcohol carefully. This information is not intended to replace advice given to you by your health care provider. Make sure you discuss any questions you have with your health care provider. Document Revised: 03/03/2019 Document Reviewed: 03/03/2019 Elsevier Patient Education  2021 [employer]  

## 2020-05-25 NOTE — Assessment & Plan Note (Signed)
Well-controlled hypertension. Continue Tenoretic 50-25 mg daily. Hemoglobin A1c at 7.1, better than before. Continue Metformin and glipizide twice a day. Diet and nutrition discussed. Continue rosuvastatin 20 mg daily. Follow-up in 3 months.

## 2020-05-26 LAB — MICROALBUMIN, URINE: Microalbumin, Urine: <3 ug/mL

## 2020-08-03 ENCOUNTER — Other Ambulatory Visit: Payer: Self-pay | Admitting: Emergency Medicine

## 2020-08-03 DIAGNOSIS — I10 Essential (primary) hypertension: Secondary | ICD-10-CM

## 2020-08-03 DIAGNOSIS — E785 Hyperlipidemia, unspecified: Secondary | ICD-10-CM

## 2020-08-03 DIAGNOSIS — E1169 Type 2 diabetes mellitus with other specified complication: Secondary | ICD-10-CM

## 2020-08-05 ENCOUNTER — Other Ambulatory Visit: Payer: Self-pay | Admitting: Emergency Medicine

## 2020-08-05 DIAGNOSIS — I10 Essential (primary) hypertension: Secondary | ICD-10-CM

## 2020-08-25 ENCOUNTER — Ambulatory Visit: Payer: Self-pay | Admitting: Emergency Medicine

## 2020-08-29 ENCOUNTER — Ambulatory Visit (INDEPENDENT_AMBULATORY_CARE_PROVIDER_SITE_OTHER): Payer: Self-pay | Admitting: Emergency Medicine

## 2020-08-29 ENCOUNTER — Other Ambulatory Visit: Payer: Self-pay

## 2020-08-29 ENCOUNTER — Encounter: Payer: Self-pay | Admitting: Emergency Medicine

## 2020-08-29 VITALS — BP 130/80 | HR 73 | Temp 98.3°F | Ht 68.0 in | Wt 225.0 lb

## 2020-08-29 DIAGNOSIS — I152 Hypertension secondary to endocrine disorders: Secondary | ICD-10-CM

## 2020-08-29 DIAGNOSIS — E1169 Type 2 diabetes mellitus with other specified complication: Secondary | ICD-10-CM

## 2020-08-29 DIAGNOSIS — E785 Hyperlipidemia, unspecified: Secondary | ICD-10-CM

## 2020-08-29 DIAGNOSIS — E1159 Type 2 diabetes mellitus with other circulatory complications: Secondary | ICD-10-CM

## 2020-08-29 LAB — POCT GLYCOSYLATED HEMOGLOBIN (HGB A1C): Hemoglobin A1C: 6.9 % — AB (ref 4.0–5.6)

## 2020-08-29 NOTE — Assessment & Plan Note (Signed)
Diet and nutrition discussed. Continue rosuvastatin 20 mg daily. Follow-up in 6 months. 

## 2020-08-29 NOTE — Progress Notes (Signed)
Lab Results  Component Value Date   HGBA1C 7.1 (A) 05/25/2020   BP Readings from Last 3 Encounters:  05/25/20 125/69  05/15/20 119/82  02/24/20 132/72   Lab Results  Component Value Date   CHOL 115 11/25/2019   HDL 46 11/25/2019   LDLCALC 56 11/25/2019   TRIG 57 11/25/2019   CHOLHDL 2.5 11/25/2019   Lab Results  Component Value Date   CREATININE 1.27 05/25/2020   BUN 20 05/25/2020   NA 139 05/25/2020   K 3.4 (L) 05/25/2020   CL 97 05/25/2020   CO2 27 05/25/2020   Azucena Kuba 65 y.o.   Chief Complaint  Patient presents with  . Diabetes    3 month check    HISTORY OF PRESENT ILLNESS: This is a 65 y.o. male with history of diabetes and hypertension here for follow-up. Doing well.  Has no complaints or medical concerns today.  Eating better.  Losing some weight.  218 pounds at home. 1.  Hypertension: On Tenoretic 50-25 mg daily. 2.  Diabetes: Taking metformin 1000 mg twice a day and glipizide 5 mg only once a day. 3.  Dyslipidemia: On rosuvastatin 20 mg daily. Takes 1 baby aspirin a day. Up-to-date with colonoscopy.  HPI   Prior to Admission medications   Medication Sig Start Date End Date Taking? Authorizing Provider  Ascorbic Acid (VITAMIN C PO) Take by mouth.   Yes [provider]  aspirin 81 MG tablet Take 81 mg by mouth daily.   Yes [provider]  atenolol-chlorthalidone (TENORETIC) 50-25 MG tablet TAKE 1 TABLET BY MOUTH DAILY 08/03/20 11/01/20 Yes Alonie Gazzola, Eilleen Kempf, MD  cetirizine (ZYRTEC) 10 MG tablet Take 1 tablet (10 mg total) by mouth daily. 05/10/17  Yes Linus Mako B, NP  cholecalciferol (VITAMIN D) 1000 units tablet Take 1,000 Units by mouth daily.   Yes [provider]  metFORMIN (GLUCOPHAGE) 1000 MG tablet Take 1 tablet (1,000 mg total) by mouth 2 (two) times daily with a meal. 02/24/20  Yes Kreston Ahrendt, Eilleen Kempf, MD  Multiple Vitamins-Minerals (ZINC PO) Take by mouth.   Yes [provider]  naproxen  (NAPROSYN) 500 MG tablet Take 1 tablet (500 mg total) by mouth 2 (two) times daily. 05/15/20  Yes Wieters, Hallie C, PA-C  Omega-3 Fatty Acids (FISH OIL) 1000 MG CAPS Take by mouth.   Yes [provider]  omeprazole (PRILOSEC) 20 MG capsule Take 20 mg by mouth daily.   Yes [provider]  rosuvastatin (CRESTOR) 20 MG tablet TAKE 1 TABLET(20 MG) BY MOUTH DAILY 08/03/20  Yes Kamon Fahr, Eilleen Kempf, MD  tiZANidine (ZANAFLEX) 4 MG tablet Take 0.5-1 tablets (2-4 mg total) by mouth every 6 (six) hours as needed for muscle spasms. 05/15/20  Yes Wieters, Hallie C, PA-C  Turmeric POWD by Does not apply route daily.   Yes [provider]  glipiZIDE (GLUCOTROL) 5 MG tablet Take 1 tablet (5 mg total) by mouth 2 (two) times daily with a meal. 02/24/20 05/24/20  Georgina Quint, MD    Allergies  Allergen Reactions  . Ace Inhibitors Swelling    Suspected angioedema  . Penicillins Hives, Rash and Other (See Comments)    Throat swells  . Latex Hives, Swelling and Rash    Patient Active Problem List   Diagnosis Date Noted  . Dyslipidemia associated with type 2 diabetes mellitus (HCC) 08/06/2018  . Dyslipidemia 08/06/2018  . Hypertension associated with type 2 diabetes mellitus (HCC) 07/18/2011    Past  Medical History:  Diagnosis Date  . Allergy   . Anal fissure   . COPD (chronic obstructive pulmonary disease) (HCC)    Albuterol PRN.  Marland Kitchen Hydrocele   . Hyperlipidemia   . Hypertension   . IBS (irritable bowel syndrome) 05/22/13   per chart note-pt denied    Past Surgical History:  Procedure Laterality Date  . HERNIA REPAIR  prior to 2013  . HYDROCELE EXCISION Bilateral 11/23/2013   Procedure: HYDROCELECTOMY ADULT;  Surgeon: Valetta Fuller, MD;  Location: [facility];  Service: Urology;  Laterality: Bilateral;  . VASECTOMY  prior to 2013    Social History   Socioeconomic History  . Marital status: Married    Spouse name: Not on file  . Number of  children: Not on file  . Years of education: Not on file  . Highest education level: Not on file  Occupational History  . Not on file  Tobacco Use  . Smoking status: Former Smoker    Types: Cigarettes  . Smokeless tobacco: Never Used  Vaping Use  . Vaping Use: Never used  Substance and Sexual Activity  . Alcohol use: Yes    Comment: rarely  . Drug use: No  . Sexual activity: Not on file  Other Topics Concern  . Not on file  Social History Narrative   Marital status: married      Employment: [employer]; drives all over.  Owns company.      Tobacco:  Quit smoking in 2005; smoked 25 years      Alcohol:  Rare beer      Drugs:  None      Exercise:  None   Daily caffeine    Social Determinants of Health   Financial Resource Strain: Not on file  Food Insecurity: Not on file  Transportation Needs: Not on file  Physical Activity: Not on file  Stress: Not on file  Social Connections: Not on file  Intimate Partner Violence: Not on file    Family History  Problem Relation Age of Onset  . Diabetes Father   . Hypertension Father   . Diabetes Brother   . Hypertension Brother   . Diabetes Brother   . Diabetes Brother      Review of Systems  Constitutional: Negative.  Negative for chills and fever.  HENT: Negative.  Negative for congestion and sore throat.   Respiratory: Negative.  Negative for cough and shortness of breath.   Cardiovascular: Negative.  Negative for chest pain and palpitations.  Gastrointestinal: Negative.  Negative for abdominal pain, diarrhea, melena, nausea and vomiting.  Genitourinary: Negative.  Negative for dysuria and hematuria.  Skin: Negative.  Negative for rash.  Neurological: Negative.  Negative for dizziness and headaches.  All other systems reviewed and are negative.  Today's Vitals   08/29/20 1303  BP: (!) 146/80  Pulse: 73  Temp: 98.3 F (36.8 C)  TempSrc: Oral  SpO2: 98%  Weight: 225 lb (102.1 kg)  Height: 5\' 8"  (1.727 m)    Body mass index is 34.21 kg/m. Wt Readings from Last 3 Encounters:  08/29/20 225 lb (102.1 kg)  05/25/20 227 lb (103 kg)  02/24/20 233 lb (105.7 kg)     Physical Exam Vitals reviewed.  Constitutional:      Appearance: Normal appearance.  HENT:     Head: Normocephalic.     Mouth/Throat:     Mouth: Mucous membranes are moist.     Pharynx: Oropharynx is clear.  Eyes:  Extraocular Movements: Extraocular movements intact.     Conjunctiva/sclera: Conjunctivae normal.     Pupils: Pupils are equal, round, and reactive to light.  Cardiovascular:     Rate and Rhythm: Normal rate and regular rhythm.     Pulses: Normal pulses.     Heart sounds: Normal heart sounds.  Pulmonary:     Effort: Pulmonary effort is normal.     Breath sounds: Normal breath sounds.  Musculoskeletal:        General: Normal range of motion.     Cervical back: Normal range of motion and neck supple.  Skin:    General: Skin is warm and dry.     Capillary Refill: Capillary refill takes less than 2 seconds.  Neurological:     General: No focal deficit present.     Mental Status: He is alert and oriented to person, place, and time.  Psychiatric:        Mood and Affect: Mood normal.        Behavior: Behavior normal.    Results for orders placed or performed in visit on 08/29/20 (from the past 24 hour(s))  POCT glycosylated hemoglobin (Hb A1C)     Status: Abnormal   Collection Time: 08/29/20  1:19 PM  Result Value Ref Range   Hemoglobin A1C 6.9 (A) 4.0 - 5.6 %   HbA1c POC (<> result, manual entry)     HbA1c, POC (prediabetic range)     HbA1c, POC (controlled diabetic range)       ASSESSMENT & PLAN: Hypertension associated with type 2 diabetes mellitus (HCC) Well-controlled hypertension.  Continue Tenoretic 50-25 mg daily. Diabetes better control with hemoglobin A1c of 6.9.  Continue metformin 1000 mg twice a day and glipizide 5 mg in the morning. Diet and nutrition discussed. Follow-up in 6  months.  Dyslipidemia associated with type 2 diabetes mellitus (HCC) Diet and nutrition discussed.  Continue rosuvastatin 20 mg daily. Follow-up in 6 months.  [name] was seen today for diabetes.  Diagnoses and all orders for this visit:  Hypertension associated with type 2 diabetes mellitus (HCC) -     POCT glycosylated hemoglobin (Hb A1C)  Dyslipidemia associated with type 2 diabetes mellitus (HCC)    Patient Instructions   Hypertension, Adult High blood pressure (hypertension) is when the force of blood pumping through the arteries is too strong. The arteries are the blood vessels that carry blood from the heart throughout the body. Hypertension forces the heart to work harder to pump blood and may cause arteries to become narrow or stiff. Untreated or uncontrolled hypertension can cause a heart attack, heart failure, a stroke, kidney disease, and other problems. A blood pressure reading consists of a higher number over a lower number. Ideally, your blood pressure should be below 120/80. The first ("top") number is called the systolic pressure. It is a measure of the pressure in your arteries as your heart beats. The second ("bottom") number is called the diastolic pressure. It is a measure of the pressure in your arteries as the heart relaxes. What are the causes? The exact cause of this condition is not known. There are some conditions that result in or are related to high blood pressure. What increases the risk? Some risk factors for high blood pressure are under your control. The following factors may make you more likely to develop this condition:  Smoking.  Having type 2 diabetes mellitus, high cholesterol, or both.  Not getting enough exercise or physical activity.  Being overweight.  Having too much fat, sugar, calories, or salt (sodium) in your diet.  Drinking too much alcohol. Some risk factors for high blood pressure may be difficult or impossible to change. Some of  these factors include:  Having chronic kidney disease.  Having a family history of high blood pressure.  Age. Risk increases with age.  Race. You may be at higher risk if you are African American.  Gender. Men are at higher risk than women before age 26. After age 31, women are at higher risk than men.  Having obstructive sleep apnea.  Stress. What are the signs or symptoms? High blood pressure may not cause symptoms. Very high blood pressure (hypertensive crisis) may cause:  Headache.  Anxiety.  Shortness of breath.  Nosebleed.  Nausea and vomiting.  Vision changes.  Severe chest pain.  Seizures. How is this diagnosed? This condition is diagnosed by measuring your blood pressure while you are seated, with your arm resting on a flat surface, your legs uncrossed, and your feet flat on the floor. The cuff of the blood pressure monitor will be placed directly against the skin of your upper arm at the level of your heart. It should be measured at least twice using the same arm. Certain conditions can cause a difference in blood pressure between your right and left arms. Certain factors can cause blood pressure readings to be lower or higher than normal for a short period of time:  When your blood pressure is higher when you are in a health care provider's office than when you are at home, this is called white coat hypertension. Most people with this condition do not need medicines.  When your blood pressure is higher at home than when you are in a health care provider's office, this is called masked hypertension. Most people with this condition may need medicines to control blood pressure. If you have a high blood pressure reading during one visit or you have normal blood pressure with other risk factors, you may be asked to:  Return on a different day to have your blood pressure checked again.  Monitor your blood pressure at home for 1 week or longer. If you are diagnosed  with hypertension, you may have other blood or imaging tests to help your health care provider understand your overall risk for other conditions. How is this treated? This condition is treated by making healthy lifestyle changes, such as eating healthy foods, exercising more, and reducing your alcohol intake. Your health care provider may prescribe medicine if lifestyle changes are not enough to get your blood pressure under control, and if:  Your systolic blood pressure is above 130.  Your diastolic blood pressure is above 80. Your personal target blood pressure may vary depending on your medical conditions, your age, and other factors. Follow these instructions at home: Eating and drinking  Eat a diet that is high in fiber and potassium, and low in sodium, added sugar, and fat. An example eating plan is called the DASH (Dietary Approaches to Stop Hypertension) diet. To eat this way: ? Eat plenty of fresh fruits and vegetables. Try to fill one half of your plate at each meal with fruits and vegetables. ? Eat whole grains, such as whole-wheat pasta, brown rice, or whole-grain bread. Fill about one fourth of your plate with whole grains. ? Eat or drink low-fat dairy products, such as skim milk or low-fat yogurt. ? Avoid fatty cuts of meat, processed or cured meats, and poultry with skin. Fill  about one fourth of your plate with lean proteins, such as fish, chicken without skin, beans, eggs, or tofu. ? Avoid pre-made and processed foods. These tend to be higher in sodium, added sugar, and fat.  Reduce your daily sodium intake. Most people with hypertension should eat less than 1,500 mg of sodium a day.  Do not drink alcohol if: ? Your health care provider tells you not to drink. ? You are pregnant, may be pregnant, or are planning to become pregnant.  If you drink alcohol: ? Limit how much you use to:  0-1 drink a day for women.  0-2 drinks a day for men. ? Be aware of how much alcohol is  in your drink. In the [place] one drink equals one 12 oz bottle of beer (355 mL), one 5 oz glass of wine (148 mL), or one 1 oz glass of hard liquor (44 mL).   Lifestyle  Work with your health care provider to maintain a healthy body weight or to lose weight. Ask what an ideal weight is for you.  Get at least 30 minutes of exercise most days of the week. Activities may include walking, swimming, or biking.  Include exercise to strengthen your muscles (resistance exercise), such as Pilates or lifting weights, as part of your weekly exercise routine. Try to do these types of exercises for 30 minutes at least 3 days a week.  Do not use any products that contain nicotine or tobacco, such as cigarettes, e-cigarettes, and chewing tobacco. If you need help quitting, ask your health care provider.  Monitor your blood pressure at home as told by your health care provider.  Keep all follow-up visits as told by your health care provider. This is important.   Medicines  Take over-the-counter and prescription medicines only as told by your health care provider. Follow directions carefully. Blood pressure medicines must be taken as prescribed.  Do not skip doses of blood pressure medicine. Doing this puts you at risk for problems and can make the medicine less effective.  Ask your health care provider about side effects or reactions to medicines that you should watch for. Contact a health care provider if you:  Think you are having a reaction to a medicine you are taking.  Have headaches that keep coming back (recurring).  Feel dizzy.  Have swelling in your ankles.  Have trouble with your vision. Get help right away if you:  Develop a severe headache or confusion.  Have unusual weakness or numbness.  Feel faint.  Have severe pain in your chest or abdomen.  Vomit repeatedly.  Have trouble breathing. Summary  Hypertension is when the force of blood pumping through your arteries is too  strong. If this condition is not controlled, it may put you at risk for serious complications.  Your personal target blood pressure may vary depending on your medical conditions, your age, and other factors. For most people, a normal blood pressure is less than 120/80.  Hypertension is treated with lifestyle changes, medicines, or a combination of both. Lifestyle changes include losing weight, eating a healthy, low-sodium diet, exercising more, and limiting alcohol. This information is not intended to replace advice given to you by your health care provider. Make sure you discuss any questions you have with your health care provider. Document Revised: 12/04/2017 Document Reviewed: 12/04/2017 Elsevier Patient Education  2021 Elsevier Inc. Diabetes Mellitus and Nutrition, Adult When you have diabetes, or diabetes mellitus, it is very important to have healthy eating  habits because your blood sugar (glucose) levels are greatly affected by what you eat and drink. Eating healthy foods in the right amounts, at about the same times every day, can help you:  Control your blood glucose.  Lower your risk of heart disease.  Improve your blood pressure.  Reach or maintain a healthy weight. What can affect my meal plan? Every person with diabetes is different, and each person has different needs for a meal plan. Your health care provider may recommend that you work with a dietitian to make a meal plan that is best for you. Your meal plan may vary depending on factors such as:  The calories you need.  The medicines you take.  Your weight.  Your blood glucose, blood pressure, and cholesterol levels.  Your activity level.  Other health conditions you have, such as heart or kidney disease. How do carbohydrates affect me? Carbohydrates, also called carbs, affect your blood glucose level more than any other type of food. Eating carbs naturally raises the amount of glucose in your blood. Carb counting  is a method for keeping track of how many carbs you eat. Counting carbs is important to keep your blood glucose at a healthy level, especially if you use insulin or take certain oral diabetes medicines. It is important to know how many carbs you can safely have in each meal. This is different for every person. Your dietitian can help you calculate how many carbs you should have at each meal and for each snack. How does alcohol affect me? Alcohol can cause a sudden decrease in blood glucose (hypoglycemia), especially if you use insulin or take certain oral diabetes medicines. Hypoglycemia can be a life-threatening condition. Symptoms of hypoglycemia, such as sleepiness, dizziness, and confusion, are similar to symptoms of having too much alcohol.  Do not drink alcohol if: ? Your health care provider tells you not to drink. ? You are pregnant, may be pregnant, or are planning to become pregnant.  If you drink alcohol: ? Do not drink on an empty stomach. ? Limit how much you use to:  0-1 drink a day for women.  0-2 drinks a day for men. ? Be aware of how much alcohol is in your drink. In the [place] one drink equals one 12 oz bottle of beer (355 mL), one 5 oz glass of wine (148 mL), or one 1 oz glass of hard liquor (44 mL). ? Keep yourself hydrated with water, diet soda, or unsweetened iced tea.  Keep in mind that regular soda, juice, and other mixers may contain a lot of sugar and must be counted as carbs. What are tips for following this plan? Reading food labels  Start by checking the serving size on the "Nutrition Facts" label of packaged foods and drinks. The amount of calories, carbs, fats, and other nutrients listed on the label is based on one serving of the item. Many items contain more than one serving per package.  Check the total grams (g) of carbs in one serving. You can calculate the number of servings of carbs in one serving by dividing the total carbs by 15. For example, if a  food has 30 g of total carbs per serving, it would be equal to 2 servings of carbs.  Check the number of grams (g) of saturated fats and trans fats in one serving. Choose foods that have a low amount or none of these fats.  Check the number of milligrams (mg) of salt (sodium) in  one serving. Most people should limit total sodium intake to less than 2,300 mg per day.  Always check the nutrition information of foods labeled as "low-fat" or "nonfat." These foods may be higher in added sugar or refined carbs and should be avoided.  Talk to your dietitian to identify your daily goals for nutrients listed on the label. Shopping  Avoid buying canned, pre-made, or processed foods. These foods tend to be high in fat, sodium, and added sugar.  Shop around the outside edge of the grocery store. This is where you will most often find fresh fruits and vegetables, bulk grains, fresh meats, and fresh dairy. Cooking  Use low-heat cooking methods, such as baking, instead of high-heat cooking methods like deep frying.  Cook using healthy oils, such as olive, canola, or sunflower oil.  Avoid cooking with butter, cream, or high-fat meats. Meal planning  Eat meals and snacks regularly, preferably at the same times every day. Avoid going long periods of time without eating.  Eat foods that are high in fiber, such as fresh fruits, vegetables, beans, and whole grains. Talk with your dietitian about how many servings of carbs you can eat at each meal.  Eat 4-6 oz (112-168 g) of lean protein each day, such as lean meat, chicken, fish, eggs, or tofu. One ounce (oz) of lean protein is equal to: ? 1 oz (28 g) of meat, chicken, or fish. ? 1 egg. ?  cup (62 g) of tofu.  Eat some foods each day that contain healthy fats, such as avocado, nuts, seeds, and fish.   What foods should I eat? Fruits Berries. Apples. Oranges. Peaches. Apricots. Plums. Grapes. Mango. Papaya. Pomegranate. Kiwi.  Cherries. Vegetables Lettuce. Spinach. Leafy greens, including kale, chard, collard greens, and mustard greens. Beets. Cauliflower. Cabbage. Broccoli. Carrots. Green beans. Tomatoes. Peppers. Onions. Cucumbers. Brussels sprouts. Grains Whole grains, such as whole-wheat or whole-grain bread, crackers, tortillas, cereal, and pasta. Unsweetened oatmeal. Quinoa. Brown or wild rice. Meats and other proteins Seafood. Poultry without skin. Lean cuts of poultry and beef. Tofu. Nuts. Seeds. Dairy Low-fat or fat-free dairy products such as milk, yogurt, and cheese. The items listed above may not be a complete list of foods and beverages you can eat. Contact a dietitian for more information. What foods should I avoid? Fruits Fruits canned with syrup. Vegetables Canned vegetables. Frozen vegetables with butter or cream sauce. Grains Refined white flour and flour products such as bread, pasta, snack foods, and cereals. Avoid all processed foods. Meats and other proteins Fatty cuts of meat. Poultry with skin. Breaded or fried meats. Processed meat. Avoid saturated fats. Dairy Full-fat yogurt, cheese, or milk. Beverages Sweetened drinks, such as soda or iced tea. The items listed above may not be a complete list of foods and beverages you should avoid. Contact a dietitian for more information. Questions to ask a health care provider  Do I need to meet with a diabetes educator?  Do I need to meet with a dietitian?  What number can I call if I have questions?  When are the best times to check my blood glucose? Where to find more information:  American Diabetes Association: diabetes.org  Academy of Nutrition and Dietetics: www.eatright.[employer] of Diabetes and Digestive and Kidney Diseases: CarFlippers.tn  Association of Diabetes Care and Education Specialists: www.diabeteseducator.org Summary  It is important to have healthy eating habits because your blood sugar  (glucose) levels are greatly affected by what you eat and drink.  A healthy  meal plan will help you control your blood glucose and maintain a healthy lifestyle.  Your health care provider may recommend that you work with a dietitian to make a meal plan that is best for you.  Keep in mind that carbohydrates (carbs) and alcohol have immediate effects on your blood glucose levels. It is important to count carbs and to use alcohol carefully. This information is not intended to replace advice given to you by your health care provider. Make sure you discuss any questions you have with your health care provider. Document Revised: 03/03/2019 Document Reviewed: 03/03/2019 Elsevier Patient Education  2021 Elsevier Inc.      Edwina Barth, MD [facility]

## 2020-08-29 NOTE — Patient Instructions (Signed)
Hypertension, Adult High blood pressure (hypertension) is when the force of blood pumping through the arteries is too strong. The arteries are the blood vessels that carry blood from the heart throughout the body. Hypertension forces the heart to work harder to pump blood and may cause arteries to become narrow or stiff. Untreated or uncontrolled hypertension can cause a heart attack, heart failure, a stroke, kidney disease, and other problems. A blood pressure reading consists of a higher number over a lower number. Ideally, your blood pressure should be below 120/80. The first ("top") number is called the systolic pressure. It is a measure of the pressure in your arteries as your heart beats. The second ("bottom") number is called the diastolic pressure. It is a measure of the pressure in your arteries as the heart relaxes. What are the causes? The exact cause of this condition is not known. There are some conditions that result in or are related to high blood pressure. What increases the risk? Some risk factors for high blood pressure are under your control. The following factors may make you more likely to develop this condition:  Smoking.  Having type 2 diabetes mellitus, high cholesterol, or both.  Not getting enough exercise or physical activity.  Being overweight.  Having too much fat, sugar, calories, or salt (sodium) in your diet.  Drinking too much alcohol. Some risk factors for high blood pressure may be difficult or impossible to change. Some of these factors include:  Having chronic kidney disease.  Having a family history of high blood pressure.  Age. Risk increases with age.  Race. You may be at higher risk if you are African American.  Gender. Men are at higher risk than women before age 45. After age 65, women are at higher risk than men.  Having obstructive sleep apnea.  Stress. What are the signs or symptoms? High blood pressure may not cause symptoms. Very high  blood pressure (hypertensive crisis) may cause:  Headache.  Anxiety.  Shortness of breath.  Nosebleed.  Nausea and vomiting.  Vision changes.  Severe chest pain.  Seizures. How is this diagnosed? This condition is diagnosed by measuring your blood pressure while you are seated, with your arm resting on a flat surface, your legs uncrossed, and your feet flat on the floor. The cuff of the blood pressure monitor will be placed directly against the skin of your upper arm at the level of your heart. It should be measured at least twice using the same arm. Certain conditions can cause a difference in blood pressure between your right and left arms. Certain factors can cause blood pressure readings to be lower or higher than normal for a short period of time:  When your blood pressure is higher when you are in a health care provider's office than when you are at home, this is called white coat hypertension. Most people with this condition do not need medicines.  When your blood pressure is higher at home than when you are in a health care provider's office, this is called masked hypertension. Most people with this condition may need medicines to control blood pressure. If you have a high blood pressure reading during one visit or you have normal blood pressure with other risk factors, you may be asked to:  Return on a different day to have your blood pressure checked again.  Monitor your blood pressure at home for 1 week or longer. If you are diagnosed with hypertension, you may have other blood or   imaging tests to help your health care provider understand your overall risk for other conditions. How is this treated? This condition is treated by making healthy lifestyle changes, such as eating healthy foods, exercising more, and reducing your alcohol intake. Your health care provider may prescribe medicine if lifestyle changes are not enough to get your blood pressure under control, and  if:  Your systolic blood pressure is above 130.  Your diastolic blood pressure is above 80. Your personal target blood pressure may vary depending on your medical conditions, your age, and other factors. Follow these instructions at home: Eating and drinking  Eat a diet that is high in fiber and potassium, and low in sodium, added sugar, and fat. An example eating plan is called the DASH (Dietary Approaches to Stop Hypertension) diet. To eat this way: ? Eat plenty of fresh fruits and vegetables. Try to fill one half of your plate at each meal with fruits and vegetables. ? Eat whole grains, such as whole-wheat pasta, brown rice, or whole-grain bread. Fill about one fourth of your plate with whole grains. ? Eat or drink low-fat dairy products, such as skim milk or low-fat yogurt. ? Avoid fatty cuts of meat, processed or cured meats, and poultry with skin. Fill about one fourth of your plate with lean proteins, such as fish, chicken without skin, beans, eggs, or tofu. ? Avoid pre-made and processed foods. These tend to be higher in sodium, added sugar, and fat.  Reduce your daily sodium intake. Most people with hypertension should eat less than 1,500 mg of sodium a day.  Do not drink alcohol if: ? Your health care provider tells you not to drink. ? You are pregnant, may be pregnant, or are planning to become pregnant.  If you drink alcohol: ? Limit how much you use to:  0-1 drink a day for women.  0-2 drinks a day for men. ? Be aware of how much alcohol is in your drink. In the [place] one drink equals one 12 oz bottle of beer (355 mL), one 5 oz glass of wine (148 mL), or one 1 oz glass of hard liquor (44 mL).   Lifestyle  Work with your health care provider to maintain a healthy body weight or to lose weight. Ask what an ideal weight is for you.  Get at least 30 minutes of exercise most days of the week. Activities may include walking, swimming, or biking.  Include exercise to  strengthen your muscles (resistance exercise), such as Pilates or lifting weights, as part of your weekly exercise routine. Try to do these types of exercises for 30 minutes at least 3 days a week.  Do not use any products that contain nicotine or tobacco, such as cigarettes, e-cigarettes, and chewing tobacco. If you need help quitting, ask your health care provider.  Monitor your blood pressure at home as told by your health care provider.  Keep all follow-up visits as told by your health care provider. This is important.   Medicines  Take over-the-counter and prescription medicines only as told by your health care provider. Follow directions carefully. Blood pressure medicines must be taken as prescribed.  Do not skip doses of blood pressure medicine. Doing this puts you at risk for problems and can make the medicine less effective.  Ask your health care provider about side effects or reactions to medicines that you should watch for. Contact a health care provider if you:  Think you are having a reaction to a   medicine you are taking.  Have headaches that keep coming back (recurring).  Feel dizzy.  Have swelling in your ankles.  Have trouble with your vision. Get help right away if you:  Develop a severe headache or confusion.  Have unusual weakness or numbness.  Feel faint.  Have severe pain in your chest or abdomen.  Vomit repeatedly.  Have trouble breathing. Summary  Hypertension is when the force of blood pumping through your arteries is too strong. If this condition is not controlled, it may put you at risk for serious complications.  Your personal target blood pressure may vary depending on your medical conditions, your age, and other factors. For most people, a normal blood pressure is less than 120/80.  Hypertension is treated with lifestyle changes, medicines, or a combination of both. Lifestyle changes include losing weight, eating a healthy, low-sodium diet,  exercising more, and limiting alcohol. This information is not intended to replace advice given to you by your health care provider. Make sure you discuss any questions you have with your health care provider. Document Revised: 12/04/2017 Document Reviewed: 12/04/2017 Elsevier Patient Education  2021 [employer] Diabetes Mellitus and Nutrition, Adult When you have diabetes, or diabetes mellitus, it is very important to have healthy eating habits because your blood sugar (glucose) levels are greatly affected by what you eat and drink. Eating healthy foods in the right amounts, at about the same times every day, can help you:  Control your blood glucose.  Lower your risk of heart disease.  Improve your blood pressure.  Reach or maintain a healthy weight. What can affect my meal plan? Every person with diabetes is different, and each person has different needs for a meal plan. Your health care provider may recommend that you work with a dietitian to make a meal plan that is best for you. Your meal plan may vary depending on factors such as:  The calories you need.  The medicines you take.  Your weight.  Your blood glucose, blood pressure, and cholesterol levels.  Your activity level.  Other health conditions you have, such as heart or kidney disease. How do carbohydrates affect me? Carbohydrates, also called carbs, affect your blood glucose level more than any other type of food. Eating carbs naturally raises the amount of glucose in your blood. Carb counting is a method for keeping track of how many carbs you eat. Counting carbs is important to keep your blood glucose at a healthy level, especially if you use insulin or take certain oral diabetes medicines. It is important to know how many carbs you can safely have in each meal. This is different for every person. Your dietitian can help you calculate how many carbs you should have at each meal and for each snack. How does alcohol  affect me? Alcohol can cause a sudden decrease in blood glucose (hypoglycemia), especially if you use insulin or take certain oral diabetes medicines. Hypoglycemia can be a life-threatening condition. Symptoms of hypoglycemia, such as sleepiness, dizziness, and confusion, are similar to symptoms of having too much alcohol.  Do not drink alcohol if: ? Your health care provider tells you not to drink. ? You are pregnant, may be pregnant, or are planning to become pregnant.  If you drink alcohol: ? Do not drink on an empty stomach. ? Limit how much you use to:  0-1 drink a day for women.  0-2 drinks a day for men. ? Be aware of how much alcohol is in your drink.   In the [place] one drink equals one 12 oz bottle of beer (355 mL), one 5 oz glass of wine (148 mL), or one 1 oz glass of hard liquor (44 mL). ? Keep yourself hydrated with water, diet soda, or unsweetened iced tea.  Keep in mind that regular soda, juice, and other mixers may contain a lot of sugar and must be counted as carbs. What are tips for following this plan? Reading food labels  Start by checking the serving size on the "Nutrition Facts" label of packaged foods and drinks. The amount of calories, carbs, fats, and other nutrients listed on the label is based on one serving of the item. Many items contain more than one serving per package.  Check the total grams (g) of carbs in one serving. You can calculate the number of servings of carbs in one serving by dividing the total carbs by 15. For example, if a food has 30 g of total carbs per serving, it would be equal to 2 servings of carbs.  Check the number of grams (g) of saturated fats and trans fats in one serving. Choose foods that have a low amount or none of these fats.  Check the number of milligrams (mg) of salt (sodium) in one serving. Most people should limit total sodium intake to less than 2,300 mg per day.  Always check the nutrition information of foods labeled as  "low-fat" or "nonfat." These foods may be higher in added sugar or refined carbs and should be avoided.  Talk to your dietitian to identify your daily goals for nutrients listed on the label. Shopping  Avoid buying canned, pre-made, or processed foods. These foods tend to be high in fat, sodium, and added sugar.  Shop around the outside edge of the grocery store. This is where you will most often find fresh fruits and vegetables, bulk grains, fresh meats, and fresh dairy. Cooking  Use low-heat cooking methods, such as baking, instead of high-heat cooking methods like deep frying.  Cook using healthy oils, such as olive, canola, or sunflower oil.  Avoid cooking with butter, cream, or high-fat meats. Meal planning  Eat meals and snacks regularly, preferably at the same times every day. Avoid going long periods of time without eating.  Eat foods that are high in fiber, such as fresh fruits, vegetables, beans, and whole grains. Talk with your dietitian about how many servings of carbs you can eat at each meal.  Eat 4-6 oz (112-168 g) of lean protein each day, such as lean meat, chicken, fish, eggs, or tofu. One ounce (oz) of lean protein is equal to: ? 1 oz (28 g) of meat, chicken, or fish. ? 1 egg. ?  cup (62 g) of tofu.  Eat some foods each day that contain healthy fats, such as avocado, nuts, seeds, and fish.   What foods should I eat? Fruits Berries. Apples. Oranges. Peaches. Apricots. Plums. Grapes. Mango. Papaya. Pomegranate. Kiwi. Cherries. Vegetables Lettuce. Spinach. Leafy greens, including kale, chard, collard greens, and mustard greens. Beets. Cauliflower. Cabbage. Broccoli. Carrots. Green beans. Tomatoes. Peppers. Onions. Cucumbers. Brussels sprouts. Grains Whole grains, such as whole-wheat or whole-grain bread, crackers, tortillas, cereal, and pasta. Unsweetened oatmeal. Quinoa. Brown or wild rice. Meats and other proteins Seafood. Poultry without skin. Lean cuts of  poultry and beef. Tofu. Nuts. Seeds. Dairy Low-fat or fat-free dairy products such as milk, yogurt, and cheese. The items listed above may not be a complete list of foods and beverages you can eat.   Contact a dietitian for more information. What foods should I avoid? Fruits Fruits canned with syrup. Vegetables Canned vegetables. Frozen vegetables with butter or cream sauce. Grains Refined white flour and flour products such as bread, pasta, snack foods, and cereals. Avoid all processed foods. Meats and other proteins Fatty cuts of meat. Poultry with skin. Breaded or fried meats. Processed meat. Avoid saturated fats. Dairy Full-fat yogurt, cheese, or milk. Beverages Sweetened drinks, such as soda or iced tea. The items listed above may not be a complete list of foods and beverages you should avoid. Contact a dietitian for more information. Questions to ask a health care provider  Do I need to meet with a diabetes educator?  Do I need to meet with a dietitian?  What number can I call if I have questions?  When are the best times to check my blood glucose? Where to find more information:  American Diabetes Association: diabetes.org  Academy of Nutrition and Dietetics: www.eatright.org  National Institute of Diabetes and Digestive and Kidney Diseases: www.niddk.nih.gov  Association of Diabetes Care and Education Specialists: www.diabeteseducator.org Summary  It is important to have healthy eating habits because your blood sugar (glucose) levels are greatly affected by what you eat and drink.  A healthy meal plan will help you control your blood glucose and maintain a healthy lifestyle.  Your health care provider may recommend that you work with a dietitian to make a meal plan that is best for you.  Keep in mind that carbohydrates (carbs) and alcohol have immediate effects on your blood glucose levels. It is important to count carbs and to use alcohol carefully. This  information is not intended to replace advice given to you by your health care provider. Make sure you discuss any questions you have with your health care provider. Document Revised: 03/03/2019 Document Reviewed: 03/03/2019 Elsevier Patient Education  2021 [employer] Inc.  

## 2020-08-29 NOTE — Assessment & Plan Note (Signed)
Well-controlled hypertension.  Continue Tenoretic 50-25 mg daily. Diabetes better control with hemoglobin A1c of 6.9.  Continue metformin 1000 mg twice a day and glipizide 5 mg in the morning. Diet and nutrition discussed. Follow-up in 6 months.

## 2020-09-06 ENCOUNTER — Telehealth: Payer: Self-pay | Admitting: Emergency Medicine

## 2020-09-06 NOTE — Telephone Encounter (Signed)
Patient called and is wanting to know if the medications that he takes will make him tired. He said that he is wanting Dr. Alvy Bimler to review his chart and see if there is anything that is making him tired. He can be reached at [phone]. Please advise

## 2020-09-07 ENCOUNTER — Other Ambulatory Visit: Payer: Self-pay | Admitting: Emergency Medicine

## 2020-09-07 DIAGNOSIS — R531 Weakness: Secondary | ICD-10-CM

## 2020-09-07 NOTE — Telephone Encounter (Signed)
Spoke to patient, Dr Alvy Bimler has labs pending and come to the office to have them done.

## 2020-09-07 NOTE — Telephone Encounter (Signed)
We saw him recently on 08/29/2020 and he was doing well.  This could be a viral illness but could be many other things.  Recommend he gets blood work done.  Have him come in for blood work.  I just placed some orders.  Thanks.

## 2020-09-07 NOTE — Telephone Encounter (Signed)
See message, thanks. 

## 2020-09-12 ENCOUNTER — Other Ambulatory Visit (INDEPENDENT_AMBULATORY_CARE_PROVIDER_SITE_OTHER): Payer: Self-pay

## 2020-09-12 ENCOUNTER — Other Ambulatory Visit: Payer: Self-pay

## 2020-09-12 DIAGNOSIS — R531 Weakness: Secondary | ICD-10-CM

## 2020-09-12 LAB — CBC WITH DIFFERENTIAL/PLATELET
Basophils Absolute: 0.1 10*3/uL (ref 0.0–0.1)
Basophils Relative: 1.1 % (ref 0.0–3.0)
Eosinophils Absolute: 0.2 10*3/uL (ref 0.0–0.7)
Eosinophils Relative: 2.4 % (ref 0.0–5.0)
HCT: 40 % (ref 39.0–52.0)
Hemoglobin: 13.7 g/dL (ref 13.0–17.0)
Lymphocytes Relative: 28.2 % (ref 12.0–46.0)
Lymphs Abs: 1.9 10*3/uL (ref 0.7–4.0)
MCHC: 34.3 g/dL (ref 30.0–36.0)
MCV: 82.7 fl (ref 78.0–100.0)
Monocytes Absolute: 0.7 10*3/uL (ref 0.1–1.0)
Monocytes Relative: 10.3 % (ref 3.0–12.0)
Neutro Abs: 4 10*3/uL (ref 1.4–7.7)
Neutrophils Relative %: 58 % (ref 43.0–77.0)
Platelets: 265 10*3/uL (ref 150.0–400.0)
RBC: 4.84 Mil/uL (ref 4.22–5.81)
RDW: 13.6 % (ref 11.5–15.5)
WBC: 6.9 10*3/uL (ref 4.0–10.5)

## 2020-09-12 LAB — VITAMIN D 25 HYDROXY (VIT D DEFICIENCY, FRACTURES): VITD: 32.43 ng/mL (ref 30.00–100.00)

## 2020-09-12 LAB — HEMOGLOBIN A1C: Hgb A1c MFr Bld: 7.8 % — ABNORMAL HIGH (ref 4.6–6.5)

## 2020-09-12 LAB — TSH: TSH: 0.07 u[IU]/mL — ABNORMAL LOW (ref 0.35–4.50)

## 2020-09-12 LAB — VITAMIN B12: Vitamin B-12: 249 pg/mL (ref 211–911)

## 2020-09-13 LAB — COMPREHENSIVE METABOLIC PANEL
ALT: 22 U/L (ref 0–53)
AST: 18 U/L (ref 0–37)
Albumin: 4.1 g/dL (ref 3.5–5.2)
Alkaline Phosphatase: 54 U/L (ref 39–117)
BUN: 16 mg/dL (ref 6–23)
CO2: 28 mEq/L (ref 19–32)
Calcium: 9.4 mg/dL (ref 8.4–10.5)
Chloride: 100 mEq/L (ref 96–112)
Creatinine, Ser: 1.14 mg/dL (ref 0.40–1.50)
GFR: 68.27 mL/min (ref >60.00)
Glucose, Bld: 117 mg/dL — ABNORMAL HIGH (ref 70–99)
Potassium: 3.3 mEq/L — ABNORMAL LOW (ref 3.5–5.1)
Sodium: 140 mEq/L (ref 135–145)
Total Bilirubin: 0.6 mg/dL (ref 0.2–1.2)
Total Protein: 7.3 g/dL (ref 6.0–8.3)

## 2020-09-13 LAB — LIPID PANEL
Cholesterol: 120 mg/dL (ref 0–200)
HDL: 45.3 mg/dL (ref >39.00)
LDL Cholesterol: 46 mg/dL (ref 0–99)
NonHDL: 74.81
Total CHOL/HDL Ratio: 3
Triglycerides: 142 mg/dL (ref 0.0–149.0)
VLDL: 28.4 mg/dL (ref 0.0–40.0)

## 2020-09-13 NOTE — Progress Notes (Signed)
Have him schedule office visit for this.  Thanks.

## 2020-09-29 ENCOUNTER — Other Ambulatory Visit: Payer: Self-pay | Admitting: Emergency Medicine

## 2020-09-29 DIAGNOSIS — I152 Hypertension secondary to endocrine disorders: Secondary | ICD-10-CM

## 2020-10-04 ENCOUNTER — Other Ambulatory Visit: Payer: Self-pay

## 2020-10-04 ENCOUNTER — Ambulatory Visit (INDEPENDENT_AMBULATORY_CARE_PROVIDER_SITE_OTHER): Payer: Self-pay | Admitting: Emergency Medicine

## 2020-10-04 ENCOUNTER — Encounter: Payer: Self-pay | Admitting: Emergency Medicine

## 2020-10-04 VITALS — BP 120/84 | HR 59 | Temp 98.5°F | Ht 68.0 in | Wt 230.2 lb

## 2020-10-04 DIAGNOSIS — I152 Hypertension secondary to endocrine disorders: Secondary | ICD-10-CM

## 2020-10-04 DIAGNOSIS — N529 Male erectile dysfunction, unspecified: Secondary | ICD-10-CM

## 2020-10-04 DIAGNOSIS — L989 Disorder of the skin and subcutaneous tissue, unspecified: Secondary | ICD-10-CM

## 2020-10-04 DIAGNOSIS — E1159 Type 2 diabetes mellitus with other circulatory complications: Secondary | ICD-10-CM

## 2020-10-04 DIAGNOSIS — R29818 Other symptoms and signs involving the nervous system: Secondary | ICD-10-CM

## 2020-10-04 DIAGNOSIS — E785 Hyperlipidemia, unspecified: Secondary | ICD-10-CM

## 2020-10-04 DIAGNOSIS — R531 Weakness: Secondary | ICD-10-CM | POA: Insufficient documentation

## 2020-10-04 DIAGNOSIS — F4321 Adjustment disorder with depressed mood: Secondary | ICD-10-CM

## 2020-10-04 DIAGNOSIS — E1165 Type 2 diabetes mellitus with hyperglycemia: Secondary | ICD-10-CM

## 2020-10-04 DIAGNOSIS — E1169 Type 2 diabetes mellitus with other specified complication: Secondary | ICD-10-CM

## 2020-10-04 MED ORDER — SILDENAFIL CITRATE 100 MG PO TABS: 50.0000 mg | ORAL_TABLET | Freq: Every day | ORAL | 11 refills | Status: DC | PRN

## 2020-10-04 MED ORDER — METFORMIN HCL 1000 MG PO TABS: 1000.0000 mg | ORAL_TABLET | Freq: Two times a day (BID) | ORAL | 3 refills | Status: DC

## 2020-10-04 NOTE — Progress Notes (Signed)
Larry Dawson 65 y.o.   Chief Complaint  Patient presents with   DROWSY    And tired for a year    HISTORY OF PRESENT ILLNESS: This is a 65 y.o. male complaining of general weakness for about a year. Still actively working about 12 to 14 hours/day.  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  Snores.  Wakes up tired.  Daytime somnolence. Mental stress also due to marital problems. Diabetic and hypertensive. Recent blood work reviewed with patient.  Low normal vitamin D and vitamin B12.  A1c at 7.8.  Rest of labs unremarkable.  HPI   Prior to Admission medications   Medication Sig Start Date End Date Taking? Authorizing Provider  Ascorbic Acid (VITAMIN C PO) Take by mouth.   Yes [provider]  aspirin 81 MG tablet Take 81 mg by mouth daily.   Yes [provider]  atenolol-chlorthalidone (TENORETIC) 50-25 MG tablet TAKE 1 TABLET BY MOUTH DAILY 08/03/20 11/01/20 Yes Nikko Goldwire, Eilleen Kempf, MD  cetirizine (ZYRTEC) 10 MG tablet Take 1 tablet (10 mg total) by mouth daily. 05/10/17  Yes Linus Mako B, NP  cholecalciferol (VITAMIN D) 1000 units tablet Take 1,000 Units by mouth daily.   Yes [provider]  metFORMIN (GLUCOPHAGE) 1000 MG tablet Take 1 tablet (1,000 mg total) by mouth 2 (two) times daily with a meal. 02/24/20  Yes Yaretsi Humphres, Eilleen Kempf, MD  Multiple Vitamins-Minerals (ZINC PO) Take by mouth.   Yes [provider]  naproxen (NAPROSYN) 500 MG tablet Take 1 tablet (500 mg total) by mouth 2 (two) times daily. 05/15/20  Yes Wieters, Hallie C, PA-C  Omega-3 Fatty Acids (FISH OIL) 1000 MG CAPS Take by mouth.   Yes [provider]  omeprazole (PRILOSEC) 20 MG capsule Take 20 mg by mouth daily.   Yes [provider]  rosuvastatin (CRESTOR) 20 MG tablet TAKE 1 TABLET(20 MG) BY MOUTH DAILY 08/03/20  Yes Mccartney Chuba, Eilleen Kempf, MD  tiZANidine (ZANAFLEX) 4 MG tablet Take 0.5-1 tablets (2-4 mg total) by mouth  every 6 (six) hours as needed for muscle spasms. 05/15/20  Yes Wieters, Hallie C, PA-C  Turmeric POWD by Does not apply route daily.   Yes [provider]  glipiZIDE (GLUCOTROL) 5 MG tablet Take 1 tablet (5 mg total) by mouth 2 (two) times daily with a meal. 02/24/20 05/24/20  Georgina Quint, MD    Allergies  Allergen Reactions   Ace Inhibitors Swelling    Suspected angioedema   Penicillins Hives, Rash and Other (See Comments)    Throat swells   Latex Hives, Swelling and Rash    Patient Active Problem List   Diagnosis Date Noted   Dyslipidemia associated with type 2 diabetes mellitus (HCC) 08/06/2018   Dyslipidemia 08/06/2018   Hypertension associated with type 2 diabetes mellitus (HCC) 07/18/2011    Past Medical History:  Diagnosis Date   Allergy    Anal fissure    COPD (chronic obstructive pulmonary disease) (HCC)    Albuterol PRN.   Hydrocele    Hyperlipidemia    Hypertension    IBS (irritable bowel syndrome) 05/22/13   per chart note-pt denied    Past Surgical History:  Procedure Laterality Date   HERNIA REPAIR  prior to 2013   HYDROCELE EXCISION Bilateral 11/23/2013   Procedure: HYDROCELECTOMY ADULT;  Surgeon: Valetta Fuller, MD;  Location: [facility];  Service: Urology;  Laterality: Bilateral;   VASECTOMY  prior to 2013    Social History   Socioeconomic History   Marital status: Married    Spouse name: Not on file   Number of children: Not on file   Years of education: Not on file   Highest education level: Not on file  Occupational History   Not on file  Tobacco Use   Smoking status: Former    Pack years: 0.00    Types: Cigarettes   Smokeless tobacco: Never  Vaping Use   Vaping Use: Never used  Substance and Sexual Activity   Alcohol use: Yes    Comment: rarely   Drug use: No   Sexual activity: Not on file  Other Topics Concern   Not on file  Social History Narrative   Marital status: married      Employment: [employer]; drives all over.  Owns company.      Tobacco:  Quit smoking in 2005; smoked 25 years      Alcohol:  Rare beer      Drugs:  None      Exercise:  None   Daily caffeine    Social Determinants of Health   Financial Resource Strain: Not on file  Food Insecurity: Not on file  Transportation Needs: Not on file  Physical Activity: Not on file  Stress: Not on file  Social Connections: Not on file  Intimate Partner Violence: Not on file    Family History  Problem Relation Age of Onset   Diabetes Father    Hypertension Father    Diabetes Brother    Hypertension Brother    Diabetes Brother    Diabetes Brother      Review of Systems  Constitutional:  Positive for malaise/fatigue. Negative for chills and fever.  HENT: Negative.  Negative for congestion and sore throat.   Respiratory: Negative.  Negative for cough and shortness of breath.   Cardiovascular: Negative.  Negative for chest pain and palpitations.  Gastrointestinal:  Negative for abdominal pain, blood in stool, diarrhea, melena, nausea and vomiting.  Genitourinary: Negative.  Negative for dysuria and hematuria.  Skin: Negative.  Negative for rash.  Neurological:  Positive for weakness. Negative for dizziness, sensory change and headaches.  All other systems reviewed and are negative.  Today's Vitals   10/04/20 0857  BP: 120/84  Pulse: (!) 59  Temp: 98.5 F (36.9 C)  TempSrc: Oral  SpO2: 97%  Weight: 230 lb 3.2 oz (104.4 kg)  Height: 5\' 8"  (1.727 m)   Body mass index is 35 kg/m. Wt Readings from Last 3 Encounters:  10/04/20 230 lb 3.2 oz (104.4 kg)  08/29/20 225 lb (102.1 kg)  05/25/20 227 lb (103 kg)    Physical Exam Constitutional:      Appearance: Normal appearance.  HENT:     Head: Normocephalic.  Eyes:     Extraocular Movements: Extraocular movements intact.     Pupils: Pupils are equal, round, and reactive to light.  Cardiovascular:     Rate and Rhythm: Normal rate and regular  rhythm.     Pulses: Normal pulses.     Heart sounds: Normal heart sounds.  Pulmonary:     Effort: Pulmonary effort is normal.     Breath sounds: Normal breath sounds.  Musculoskeletal:        General: Normal range of motion.     Cervical back: Normal range of motion and neck supple.  Skin:    General: Skin is warm and dry.  Capillary Refill: Capillary refill takes less than 2 seconds.  Neurological:     General: No focal deficit present.     Mental Status: He is alert and oriented to person, place, and time.  Psychiatric:        Mood and Affect: Mood normal.        Behavior: Behavior normal.     ASSESSMENT & PLAN: A total of 30 minutes was spent with the patient and counseling/coordination of care regarding differential diagnosis of general weakness and tiredness, need for work-up and approach to treatment including need to reduce number of working hours, sleep apnea studies, marriage counselor, improve hydration and nutrition, education on nutrition, review of most recent blood work results, review of most recent office visit notes, cardiovascular risks associated with diabetes and hypertension, health maintenance items, erectile dysfunction treatment with Viagra, prognosis, documentation and need for follow-up.  General weakness Multifactorial.  Working long hours.  No quality sleep. May have sleep apnea.  Increased physical and mental stress. Marital problems. Diabetes with suboptimal control and nutrition. Needs to work on better working schedule with less number of work hours. Needs to improve quality of sleep.  Will refer to neurology for sleep apnea studies. Recommend marriage counselor. Continue diabetes medication and decrease amount of daily carbohydrate intake Needs to stay better hydrated. Follow-up after neurology evaluation for sleep studies or 3 months whichever is earliest.  Hypertension associated with type 2 diabetes mellitus (HCC) Well-controlled  hypertension.  Continue Tenoretic 50-25 mg daily. Two last and close hemoglobin A1c at 6.9-second at 7.8.  Recommend to continue metformin 1000 mg twice a day and improve diet with reduced amount of daily carbohydrate intake.  Will consider additional medication during next visit. May be contributing to general weakness.  Dyslipidemia associated with type 2 diabetes mellitus (HCC) Diet and nutrition discussed.  Continue rosuvastatin 20 mg daily.  Erectile dysfunction Recommend to use Viagra as needed.  New prescription sent. Durrel was seen today for drowsy.  Diagnoses and all orders for this visit:  General weakness  Erectile dysfunction, unspecified erectile dysfunction type -     sildenafil (VIAGRA) 100 MG tablet; Take 0.5-1 tablets (50-100 mg total) by mouth daily as needed for erectile dysfunction.  Suspected sleep apnea -     Ambulatory referral to Neurology  Dyslipidemia associated with type 2 diabetes mellitus (HCC)  Hypertension associated with type 2 diabetes mellitus (HCC)  Situational depression  Skin lesions -     Ambulatory referral to Dermatology  Type 2 diabetes mellitus with hyperglycemia, without long-term current use of insulin (HCC) -     metFORMIN (GLUCOPHAGE) 1000 MG tablet; Take 1 tablet (1,000 mg total) by mouth 2 (two) times daily with a meal.  Patient Instructions  Weakness Weakness is a lack of strength. You may feel weak all over your body (generalized), or you may feel weak in one part of your body (focal). There are many potential causes of weakness. Sometimes, the cause of your weakness may not be known. Some causes of weakness can be serious, so it isimportant to see your doctor. Follow these instructions at home: Activity Rest as needed. Try to get enough sleep. Most adults need 7-8 hours of sleep each night. Talk to your doctor about how much sleep you need each night. Do exercises, such as arm curls and leg raises, for 30 minutes at least 2  days a week or as told by your doctor. Think about working with a physical therapist or trainer to  help you get stronger. General instructions  Take over-the-counter and prescription medicines only as told by your doctor. Eat a healthy, well-balanced diet. This includes: Proteins to build muscles, such as lean meats and fish. Fresh fruits and vegetables. Carbohydrates to boost energy, such as whole grains. Drink enough fluid to keep your pee (urine) pale yellow. Keep all follow-up visits as told by your doctor. This is important.  Contact a doctor if: Your weakness does not get better or it gets worse. Your weakness affects your ability to: Think clearly. Do your normal daily activities. Get help right away if you: Have sudden weakness on one side of your face or body. Have chest pain. Have trouble breathing or shortness of breath. Have problems with your vision. Have trouble talking or swallowing. Have trouble standing or walking. Are light-headed. Pass out (lose consciousness). Summary Weakness is a lack of strength. You may feel weak all over your body or just in one part of your body. There are many potential causes of weakness. Sometimes, the cause of your weakness may not be known. Rest as needed, and try to get enough sleep. Most adults need 7-8 hours of sleep each night. Eat a healthy, well-balanced diet. This information is not intended to replace advice given to you by your health care provider. Make sure you discuss any questions you have with your healthcare provider. Document Revised: 10/30/2017 Document Reviewed: 10/30/2017 Elsevier Patient Education  2022 Elsevier Inc.   Edwina Barth, MD [facility]

## 2020-10-04 NOTE — Patient Instructions (Signed)
Weakness Weakness is a lack of strength. You may feel weak all over your body (generalized), or you may feel weak in one part of your body (focal). There are many potential causes of weakness. Sometimes, the cause of your weakness may not be known. Some causes of weakness can be serious, so it isimportant to see your doctor. Follow these instructions at home: Activity Rest as needed. Try to get enough sleep. Most adults need 7-8 hours of sleep each night. Talk to your doctor about how much sleep you need each night. Do exercises, such as arm curls and leg raises, for 30 minutes at least 2 days a week or as told by your doctor. Think about working with a physical therapist or trainer to help you get stronger. General instructions  Take over-the-counter and prescription medicines only as told by your doctor. Eat a healthy, well-balanced diet. This includes: Proteins to build muscles, such as lean meats and fish. Fresh fruits and vegetables. Carbohydrates to boost energy, such as whole grains. Drink enough fluid to keep your pee (urine) pale yellow. Keep all follow-up visits as told by your doctor. This is important.  Contact a doctor if: Your weakness does not get better or it gets worse. Your weakness affects your ability to: Think clearly. Do your normal daily activities. Get help right away if you: Have sudden weakness on one side of your face or body. Have chest pain. Have trouble breathing or shortness of breath. Have problems with your vision. Have trouble talking or swallowing. Have trouble standing or walking. Are light-headed. Pass out (lose consciousness). Summary Weakness is a lack of strength. You may feel weak all over your body or just in one part of your body. There are many potential causes of weakness. Sometimes, the cause of your weakness may not be known. Rest as needed, and try to get enough sleep. Most adults need 7-8 hours of sleep each night. Eat a healthy,  well-balanced diet. This information is not intended to replace advice given to you by your health care provider. Make sure you discuss any questions you have with your healthcare provider. Document Revised: 10/30/2017 Document Reviewed: 10/30/2017 Elsevier Patient Education  2022 Elsevier Inc.  

## 2020-10-04 NOTE — Assessment & Plan Note (Signed)
Recommend to use Viagra as needed.  New prescription sent.

## 2020-10-04 NOTE — Assessment & Plan Note (Signed)
Diet and nutrition discussed.  Continue rosuvastatin 20 mg daily. 

## 2020-10-04 NOTE — Assessment & Plan Note (Signed)
Well-controlled hypertension.  Continue Tenoretic 50-25 mg daily. Two last and close hemoglobin A1c at 6.9-second at 7.8.  Recommend to continue metformin 1000 mg twice a day and improve diet with reduced amount of daily carbohydrate intake.  Will consider additional medication during next visit. May be contributing to general weakness.

## 2020-10-04 NOTE — Assessment & Plan Note (Signed)
Multifactorial.  Working long hours.  No quality sleep. May have sleep apnea.  Increased physical and mental stress. Marital problems. Diabetes with suboptimal control and nutrition. Needs to work on better working schedule with less number of work hours. Needs to improve quality of sleep.  Will refer to neurology for sleep apnea studies. Recommend marriage counselor. Continue diabetes medication and decrease amount of daily carbohydrate intake Needs to stay better hydrated. Follow-up after neurology evaluation for sleep studies or 3 months whichever is earliest.

## 2020-10-14 ENCOUNTER — Other Ambulatory Visit: Payer: Self-pay | Admitting: Emergency Medicine

## 2020-10-14 DIAGNOSIS — I10 Essential (primary) hypertension: Secondary | ICD-10-CM

## 2020-11-14 ENCOUNTER — Other Ambulatory Visit: Payer: Self-pay | Admitting: Emergency Medicine

## 2020-11-14 DIAGNOSIS — E119 Type 2 diabetes mellitus without complications: Secondary | ICD-10-CM

## 2021-02-22 ENCOUNTER — Other Ambulatory Visit: Payer: Self-pay

## 2021-02-22 ENCOUNTER — Ambulatory Visit (INDEPENDENT_AMBULATORY_CARE_PROVIDER_SITE_OTHER): Payer: Self-pay | Admitting: Emergency Medicine

## 2021-02-22 ENCOUNTER — Encounter: Payer: Self-pay | Admitting: Emergency Medicine

## 2021-02-22 VITALS — BP 130/70 | HR 76 | Ht 68.0 in | Wt 233.0 lb

## 2021-02-22 DIAGNOSIS — M62830 Muscle spasm of back: Secondary | ICD-10-CM

## 2021-02-22 DIAGNOSIS — M545 Low back pain, unspecified: Secondary | ICD-10-CM

## 2021-02-22 MED ORDER — DICLOFENAC SODIUM 75 MG PO TBEC: 75.0000 mg | DELAYED_RELEASE_TABLET | Freq: Two times a day (BID) | ORAL | 1 refills | Status: DC

## 2021-02-22 MED ORDER — CYCLOBENZAPRINE HCL 10 MG PO TABS: 10.0000 mg | ORAL_TABLET | Freq: Every day | ORAL | 1 refills | Status: DC

## 2021-02-22 NOTE — Patient Instructions (Signed)

## 2021-02-22 NOTE — Progress Notes (Signed)
Larry Dawson 65 y.o.   Chief Complaint  Patient presents with   Back Pain    X 2 wks, muscle spasms, pt has hx of back pain. Pain is going to legs    HISTORY OF PRESENT ILLNESS: This is a 65 y.o. male complaining o low back pain for the past couple of weeks. Has history of similar episodes for many years.  Truck driver, spends long hours driving. Pain is sharp and constant and radiates to both legs.  He is very familiar with this type of pain. Muscle relaxants sometimes helps. Denies bowel or bladder symptoms no other associated symptoms. No other complaints or medical concerns today.  Back Pain Pertinent negatives include no abdominal pain, chest pain, fever or headaches.    Prior to Admission medications   Medication Sig Start Date End Date Taking? Authorizing Provider  Ascorbic Acid (VITAMIN C PO) Take by mouth.   Yes [provider]  aspirin 81 MG tablet Take 81 mg by mouth daily.   Yes [provider]  atenolol-chlorthalidone (TENORETIC) 50-25 MG tablet TAKE 1 TABLET BY MOUTH DAILY 10/14/20 02/22/21 Yes Shantanique Hodo, Eilleen Kempf, MD  cetirizine (ZYRTEC) 10 MG tablet Take 1 tablet (10 mg total) by mouth daily. 05/10/17  Yes Linus Mako B, NP  cholecalciferol (VITAMIN D) 1000 units tablet Take 1,000 Units by mouth daily.   Yes [provider]  metFORMIN (GLUCOPHAGE) 1000 MG tablet Take 1 tablet (1,000 mg total) by mouth 2 (two) times daily with a meal. 10/04/20  Yes Lunabelle Oatley, Eilleen Kempf, MD  Multiple Vitamins-Minerals (ZINC PO) Take by mouth.   Yes [provider]  naproxen (NAPROSYN) 500 MG tablet Take 1 tablet (500 mg total) by mouth 2 (two) times daily. 05/15/20  Yes Wieters, Hallie C, PA-C  Omega-3 Fatty Acids (FISH OIL) 1000 MG CAPS Take by mouth.   Yes [provider]  omeprazole (PRILOSEC) 20 MG capsule Take 20 mg by mouth daily.   Yes [provider]  rosuvastatin (CRESTOR) 20 MG tablet TAKE 1 TABLET(20 MG) BY MOUTH DAILY  08/03/20  Yes Jalysa Swopes, Eilleen Kempf, MD  sildenafil (VIAGRA) 100 MG tablet Take 0.5-1 tablets (50-100 mg total) by mouth daily as needed for erectile dysfunction. 10/04/20  Yes Felma Pfefferle, Eilleen Kempf, MD  tiZANidine (ZANAFLEX) 4 MG tablet Take 0.5-1 tablets (2-4 mg total) by mouth every 6 (six) hours as needed for muscle spasms. 05/15/20  Yes Wieters, Hallie C, PA-C  Turmeric POWD by Does not apply route daily.   Yes [provider]  glipiZIDE (GLUCOTROL) 5 MG tablet Take 1 tablet (5 mg total) by mouth 2 (two) times daily with a meal. 02/24/20 05/24/20  Tomothy Eddins, Eilleen Kempf, MD    Allergies  Allergen Reactions   Ace Inhibitors Swelling    Suspected angioedema   Penicillins Hives, Rash and Other (See Comments)    Throat swells   Latex Hives, Swelling and Rash    Patient Active Problem List   Diagnosis Date Noted   General weakness 10/04/2020   Erectile dysfunction 10/04/2020   Dyslipidemia associated with type 2 diabetes mellitus (HCC) 08/06/2018   Dyslipidemia 08/06/2018   Hypertension associated with type 2 diabetes mellitus (HCC) 07/18/2011    Past Medical History:  Diagnosis Date   Allergy    Anal fissure    COPD (chronic obstructive pulmonary disease) (HCC)    Albuterol PRN.   Hydrocele    Hyperlipidemia    Hypertension    IBS (irritable bowel syndrome) 05/22/13  per chart note-pt denied    Past Surgical History:  Procedure Laterality Date   HERNIA REPAIR  prior to 2013   HYDROCELE EXCISION Bilateral 11/23/2013   Procedure: HYDROCELECTOMY ADULT;  Surgeon: Valetta Fuller, MD;  Location: [facility];  Service: Urology;  Laterality: Bilateral;   VASECTOMY  prior to 2013    Social History   Socioeconomic History   Marital status: Married    Spouse name: Not on file   Number of children: Not on file   Years of education: Not on file   Highest education level: Not on file  Occupational History   Not on file  Tobacco Use   Smoking status:  Former    Types: Cigarettes   Smokeless tobacco: Never  Vaping Use   Vaping Use: Never used  Substance and Sexual Activity   Alcohol use: Yes    Comment: rarely   Drug use: No   Sexual activity: Not on file  Other Topics Concern   Not on file  Social History Narrative   Marital status: married      Employment: [employer]; drives all over.  Owns company.      Tobacco:  Quit smoking in 2005; smoked 25 years      Alcohol:  Rare beer      Drugs:  None      Exercise:  None   Daily caffeine    Social Determinants of Health   Financial Resource Strain: Not on file  Food Insecurity: Not on file  Transportation Needs: Not on file  Physical Activity: Not on file  Stress: Not on file  Social Connections: Not on file  Intimate Partner Violence: Not on file    Family History  Problem Relation Age of Onset   Diabetes Father    Hypertension Father    Diabetes Brother    Hypertension Brother    Diabetes Brother    Diabetes Brother      Review of Systems  Constitutional: Negative.  Negative for chills and fever.  HENT: Negative.  Negative for congestion and sore throat.   Respiratory: Negative.  Negative for cough and shortness of breath.   Cardiovascular: Negative.  Negative for chest pain and palpitations.  Gastrointestinal: Negative.  Negative for abdominal pain, diarrhea, nausea and vomiting.  Musculoskeletal:  Positive for back pain.  Skin: Negative.  Negative for rash.  Neurological: Negative.  Negative for dizziness, sensory change, focal weakness and headaches.  All other systems reviewed and are negative.  Today's Vitals   02/22/21 1558  BP: 140/86  Pulse: 76  SpO2: 95%  Weight: 233 lb (105.7 kg)  Height: 5\' 8"  (1.727 m)   Body mass index is 35.43 kg/m.  Physical Exam Vitals reviewed.  Constitutional:      Appearance: Normal appearance.  HENT:     Head: Normocephalic.  Eyes:     Extraocular Movements: Extraocular movements intact.     Pupils:  Pupils are equal, round, and reactive to light.  Cardiovascular:     Rate and Rhythm: Normal rate and regular rhythm.  Pulmonary:     Effort: Pulmonary effort is normal.     Breath sounds: Normal breath sounds.  Abdominal:     Palpations: Abdomen is soft.     Tenderness: There is no abdominal tenderness.  Musculoskeletal:     Cervical back: No tenderness.     Lumbar back: Spasms and tenderness present. Decreased range of motion.     Right lower leg: No  edema.     Left lower leg: No edema.  Skin:    General: Skin is warm and dry.  Neurological:     General: No focal deficit present.     Mental Status: He is alert and oriented to person, place, and time.     Sensory: No sensory deficit.     Motor: No weakness.     Coordination: Coordination normal.     Gait: Gait normal.     Deep Tendon Reflexes: Reflexes normal.  Psychiatric:        Mood and Affect: Mood normal.        Behavior: Behavior normal.     ASSESSMENT & PLAN: Problem List Items Addressed This Visit   None Visit Diagnoses     Lumbar paraspinal muscle spasm    -  Primary   Relevant Medications   cyclobenzaprine (FLEXERIL) 10 MG tablet   Lumbar pain       Relevant Medications   cyclobenzaprine (FLEXERIL) 10 MG tablet   diclofenac (VOLTAREN) 75 MG EC tablet      Clinically stable.  No red flag signs or symptoms. Take medications as prescribed.  Rest and use heat pad. Follow-up in the office if no better or worse during the next several days or weeks.  Patient Instructions  Acute Back Pain, Adult Acute back pain is sudden and usually short-lived. It is often caused by an injury to the muscles and tissues in the back. The injury may result from: A muscle, tendon, or ligament getting overstretched or torn. Ligaments are tissues that connect bones to each other. Lifting something improperly can cause a back strain. Wear and tear (degeneration) of the spinal disks. Spinal disks are circular tissue that provide  cushioning between the bones of the spine (vertebrae). Twisting motions, such as while playing sports or doing yard work. A hit to the back. Arthritis. You may have a physical exam, lab tests, and imaging tests to find the cause of your pain. Acute back pain usually goes away with rest and home care. Follow these instructions at home: Managing pain, stiffness, and swelling Take over-the-counter and prescription medicines only as told by your health care provider. Treatment may include medicines for pain and inflammation that are taken by mouth or applied to the skin, or muscle relaxants. Your health care provider may recommend applying ice during the first 24-48 hours after your pain starts. To do this: Put ice in a plastic bag. Place a towel between your skin and the bag. Leave the ice on for 20 minutes, 2-3 times a day. Remove the ice if your skin turns bright red. This is very important. If you cannot feel pain, heat, or cold, you have a greater risk of damage to the area. If directed, apply heat to the affected area as often as told by your health care provider. Use the heat source that your health care provider recommends, such as a moist heat pack or a heating pad. Place a towel between your skin and the heat source. Leave the heat on for 20-30 minutes. Remove the heat if your skin turns bright red. This is especially important if you are unable to feel pain, heat, or cold. You have a greater risk of getting burned. Activity  Do not stay in bed. Staying in bed for more than 1-2 days can delay your recovery. Sit up and stand up straight. Avoid leaning forward when you sit or hunching over when you stand. If you work at a  desk, sit close to it so you do not need to lean over. Keep your chin tucked in. Keep your neck drawn back, and keep your elbows bent at a 90-degree angle (right angle). Sit high and close to the steering wheel when you drive. Add lower back (lumbar) support to your car  seat, if needed. Take short walks on even surfaces as soon as you are able. Try to increase the length of time you walk each day. Do not sit, drive, or stand in one place for more than 30 minutes at a time. Sitting or standing for long periods of time can put stress on your back. Do not drive or use heavy machinery while taking prescription pain medicine. Use proper lifting techniques. When you bend and lift, use positions that put less stress on your back: Athalia your knees. Keep the load close to your body. Avoid twisting. Exercise regularly as told by your health care provider. Exercising helps your back heal faster and helps prevent back injuries by keeping muscles strong and flexible. Work with a physical therapist to make a safe exercise program, as recommended by your health care provider. Do any exercises as told by your physical therapist. Lifestyle Maintain a healthy weight. Extra weight puts stress on your back and makes it difficult to have good posture. Avoid activities or situations that make you feel anxious or stressed. Stress and anxiety increase muscle tension and can make back pain worse. Learn ways to manage anxiety and stress, such as through exercise. General instructions Sleep on a firm mattress in a comfortable position. Try lying on your side with your knees slightly bent. If you lie on your back, put a pillow under your knees. Keep your head and neck in a straight line with your spine (neutral position) when using electronic equipment like smartphones or pads. To do this: Raise your smartphone or pad to look at it instead of bending your head or neck to look down. Put the smartphone or pad at the level of your face while looking at the screen. Follow your treatment plan as told by your health care provider. This may include: Cognitive or behavioral therapy. Acupuncture or massage therapy. Meditation or yoga. Contact a health care provider if: You have pain that is not  relieved with rest or medicine. You have increasing pain going down into your legs or buttocks. Your pain does not improve after 2 weeks. You have pain at night. You lose weight without trying. You have a fever or chills. You develop nausea or vomiting. You develop abdominal pain. Get help right away if: You develop new bowel or bladder control problems. You have unusual weakness or numbness in your arms or legs. You feel faint. These symptoms may represent a serious problem that is an emergency. Do not wait to see if the symptoms will go away. Get medical help right away. Call your local emergency services (911 in the [place] Do not drive yourself to the hospital. Summary Acute back pain is sudden and usually short-lived. Use proper lifting techniques. When you bend and lift, use positions that put less stress on your back. Take over-the-counter and prescription medicines only as told by your health care provider, and apply heat or ice as told. This information is not intended to replace advice given to you by your health care provider. Make sure you discuss any questions you have with your health care provider. Document Revised: 06/17/2020 Document Reviewed: 06/17/2020 Elsevier Patient Education  2022 [employer].  Agustina Caroli, MD  Primary Care at [facility] [place]

## 2021-03-10 ENCOUNTER — Other Ambulatory Visit: Payer: Self-pay | Admitting: Emergency Medicine

## 2021-03-10 DIAGNOSIS — I10 Essential (primary) hypertension: Secondary | ICD-10-CM

## 2021-03-10 DIAGNOSIS — E1165 Type 2 diabetes mellitus with hyperglycemia: Secondary | ICD-10-CM

## 2021-03-16 ENCOUNTER — Ambulatory Visit: Payer: Self-pay

## 2021-03-16 ENCOUNTER — Other Ambulatory Visit: Payer: Self-pay

## 2021-03-16 ENCOUNTER — Encounter: Payer: Self-pay | Admitting: Emergency Medicine

## 2021-03-16 ENCOUNTER — Other Ambulatory Visit: Payer: Self-pay | Admitting: Emergency Medicine

## 2021-03-16 ENCOUNTER — Ambulatory Visit (INDEPENDENT_AMBULATORY_CARE_PROVIDER_SITE_OTHER): Payer: Self-pay | Admitting: Emergency Medicine

## 2021-03-16 VITALS — BP 130/70 | HR 69 | Temp 98.5°F | Ht 68.0 in | Wt 234.0 lb

## 2021-03-16 DIAGNOSIS — I152 Hypertension secondary to endocrine disorders: Secondary | ICD-10-CM

## 2021-03-16 DIAGNOSIS — M51369 Other intervertebral disc degeneration, lumbar region without mention of lumbar back pain or lower extremity pain: Secondary | ICD-10-CM | POA: Insufficient documentation

## 2021-03-16 DIAGNOSIS — E1165 Type 2 diabetes mellitus with hyperglycemia: Secondary | ICD-10-CM

## 2021-03-16 DIAGNOSIS — E785 Hyperlipidemia, unspecified: Secondary | ICD-10-CM

## 2021-03-16 DIAGNOSIS — E1159 Type 2 diabetes mellitus with other circulatory complications: Secondary | ICD-10-CM

## 2021-03-16 DIAGNOSIS — E1169 Type 2 diabetes mellitus with other specified complication: Secondary | ICD-10-CM

## 2021-03-16 DIAGNOSIS — M545 Low back pain, unspecified: Secondary | ICD-10-CM

## 2021-03-16 DIAGNOSIS — M5136 Other intervertebral disc degeneration, lumbar region: Secondary | ICD-10-CM

## 2021-03-16 LAB — COMPREHENSIVE METABOLIC PANEL
ALT: 47 U/L (ref 0–53)
AST: 36 U/L (ref 0–37)
Albumin: 4.1 g/dL (ref 3.5–5.2)
Alkaline Phosphatase: 53 U/L (ref 39–117)
BUN: 20 mg/dL (ref 6–23)
CO2: 31 mEq/L (ref 19–32)
Calcium: 9.3 mg/dL (ref 8.4–10.5)
Chloride: 98 mEq/L (ref 96–112)
Creatinine, Ser: 1.31 mg/dL (ref 0.40–1.50)
GFR: 57.57 mL/min — ABNORMAL LOW (ref >60.00)
Glucose, Bld: 117 mg/dL — ABNORMAL HIGH (ref 70–99)
Potassium: 3.3 mEq/L — ABNORMAL LOW (ref 3.5–5.1)
Sodium: 136 mEq/L (ref 135–145)
Total Bilirubin: 1.1 mg/dL (ref 0.2–1.2)
Total Protein: 7.4 g/dL (ref 6.0–8.3)

## 2021-03-16 LAB — LIPID PANEL
Cholesterol: 111 mg/dL (ref 0–200)
HDL: 44.3 mg/dL (ref >39.00)
LDL Cholesterol: 49 mg/dL (ref 0–99)
NonHDL: 67.18
Total CHOL/HDL Ratio: 3
Triglycerides: 91 mg/dL (ref 0.0–149.0)
VLDL: 18.2 mg/dL (ref 0.0–40.0)

## 2021-03-16 LAB — HEMOGLOBIN A1C: Hgb A1c MFr Bld: 7.8 % — ABNORMAL HIGH (ref 4.6–6.5)

## 2021-03-16 MED ORDER — METHYLPREDNISOLONE ACETATE 80 MG/ML IJ SUSP
80.0000 mg | Freq: Once | INTRAMUSCULAR | Status: AC
  Administered 2021-03-16: 80 mg via INTRAMUSCULAR

## 2021-03-16 MED ORDER — GLIPIZIDE 10 MG PO TABS: 10.0000 mg | ORAL_TABLET | Freq: Two times a day (BID) | ORAL | 3 refills | Status: DC

## 2021-03-16 MED ORDER — TRAMADOL HCL 50 MG PO TABS: 50.0000 mg | ORAL_TABLET | Freq: Three times a day (TID) | ORAL | 0 refills | Status: AC | PRN

## 2021-03-16 NOTE — Patient Instructions (Signed)

## 2021-03-16 NOTE — Progress Notes (Signed)
Larry Dawson 65 y.o.   Chief Complaint  Patient presents with   Back Pain    X 1 month,   Hypertension    F/u    HISTORY OF PRESENT ILLNESS: This is a 65 y.o. male here for follow-up of lumbar pain. Sharp almost constant lumbar pain with some radiation to both buttocks and not associated with any other symptoms. Denies bowel or bladder symptoms.  Pain changes with position. No other associated symptoms. Also has history of hypertension.  On Tenoretic 50-25 mg daily. History of diabetes on metformin and glipizide. Lab Results  Component Value Date   HGBA1C 7.8 (H) 09/12/2020   BP Readings from Last 3 Encounters:  03/16/21 140/78  02/22/21 130/70  10/04/20 120/84     Back Pain Pertinent negatives include no abdominal pain, chest pain, dysuria, fever or headaches.  Hypertension Pertinent negatives include no chest pain, headaches, palpitations or shortness of breath.    Prior to Admission medications   Medication Sig Start Date End Date Taking? Authorizing Provider  Ascorbic Acid (VITAMIN C PO) Take by mouth.   Yes [provider]  aspirin 81 MG tablet Take 81 mg by mouth daily.   Yes [provider]  atenolol-chlorthalidone (TENORETIC) 50-25 MG tablet TAKE 1 TABLET BY MOUTH DAILY 03/10/21 06/08/21 Yes Delfino Friesen, Ines Bloomer, MD  cetirizine (ZYRTEC) 10 MG tablet Take 1 tablet (10 mg total) by mouth daily. 05/10/17  Yes Augusto Gamble B, NP  cholecalciferol (VITAMIN D) 1000 units tablet Take 1,000 Units by mouth daily.   Yes [provider]  cyclobenzaprine (FLEXERIL) 10 MG tablet Take 1 tablet (10 mg total) by mouth at bedtime. 02/22/21  Yes Ponce Skillman, Ines Bloomer, MD  diclofenac (VOLTAREN) 75 MG EC tablet Take 1 tablet (75 mg total) by mouth 2 (two) times daily. Take only as needed for pain. 02/22/21  Yes Horald Pollen, MD  metFORMIN (GLUCOPHAGE) 1000 MG tablet TAKE 1 TABLET(1000 MG) BY MOUTH TWICE DAILY WITH A MEAL 03/10/21  Yes Dozier Berkovich,  Ines Bloomer, MD  Multiple Vitamins-Minerals (ZINC PO) Take by mouth.   Yes [provider]  Omega-3 Fatty Acids (FISH OIL) 1000 MG CAPS Take by mouth.   Yes [provider]  omeprazole (PRILOSEC) 20 MG capsule Take 20 mg by mouth daily.   Yes [provider]  rosuvastatin (CRESTOR) 20 MG tablet TAKE 1 TABLET(20 MG) BY MOUTH DAILY 08/03/20  Yes Joana Nolton, Ines Bloomer, MD  sildenafil (VIAGRA) 100 MG tablet Take 0.5-1 tablets (50-100 mg total) by mouth daily as needed for erectile dysfunction. 10/04/20  Yes Stewart Pimenta, Ines Bloomer, MD  tiZANidine (ZANAFLEX) 4 MG tablet Take 0.5-1 tablets (2-4 mg total) by mouth every 6 (six) hours as needed for muscle spasms. 05/15/20  Yes Wieters, Hallie C, PA-C  Turmeric POWD by Does not apply route daily.   Yes [provider]  glipiZIDE (GLUCOTROL) 5 MG tablet Take 1 tablet (5 mg total) by mouth 2 (two) times daily with a meal. 02/24/20 05/24/20  Herndon, Ines Bloomer, MD    Allergies  Allergen Reactions   Ace Inhibitors Swelling    Suspected angioedema   Penicillins Hives, Rash and Other (See Comments)    Throat swells   Latex Hives, Swelling and Rash    Patient Active Problem List   Diagnosis Date Noted   General weakness 10/04/2020   Erectile dysfunction 10/04/2020   Dyslipidemia associated with type 2 diabetes mellitus ([place]) 08/06/2018   Dyslipidemia 08/06/2018   Hypertension associated with  type 2 diabetes mellitus ([place]) 07/18/2011    Past Medical History:  Diagnosis Date   Allergy    Anal fissure    COPD (chronic obstructive pulmonary disease) (HCC)    Albuterol PRN.   Hydrocele    Hyperlipidemia    Hypertension    IBS (irritable bowel syndrome) 05/22/13   per chart note-pt denied    Past Surgical History:  Procedure Laterality Date   HERNIA REPAIR  prior to 2013   HYDROCELE EXCISION Bilateral 11/23/2013   Procedure: HYDROCELECTOMY ADULT;  Surgeon: Bernestine Amass, MD;  Location: [facility];  Service: Urology;  Laterality: Bilateral;   VASECTOMY  prior to 2013    Social History   Socioeconomic History   Marital status: Married    Spouse name: Not on file   Number of children: Not on file   Years of education: Not on file   Highest education level: Not on file  Occupational History   Not on file  Tobacco Use   Smoking status: Former    Types: Cigarettes   Smokeless tobacco: Never  Vaping Use   Vaping Use: Never used  Substance and Sexual Activity   Alcohol use: Yes    Comment: rarely   Drug use: No   Sexual activity: Not on file  Other Topics Concern   Not on file  Social History Narrative   Marital status: married      Employment: [employer]; drives all over.  Owns company.      Tobacco:  Quit smoking in 2005; smoked 25 years      Alcohol:  Rare beer      Drugs:  None      Exercise:  None   Daily caffeine    Social Determinants of Health   Financial Resource Strain: Not on file  Food Insecurity: Not on file  Transportation Needs: Not on file  Physical Activity: Not on file  Stress: Not on file  Social Connections: Not on file  Intimate Partner Violence: Not on file    Family History  Problem Relation Age of Onset   Diabetes Father    Hypertension Father    Diabetes Brother    Hypertension Brother    Diabetes Brother    Diabetes Brother      Review of Systems  Constitutional: Negative.  Negative for chills and fever.  HENT: Negative.  Negative for congestion and sore throat.   Respiratory: Negative.  Negative for cough and shortness of breath.   Cardiovascular:  Negative for chest pain and palpitations.  Gastrointestinal: Negative.  Negative for abdominal pain, diarrhea, nausea and vomiting.  Genitourinary: Negative.  Negative for dysuria and hematuria.  Musculoskeletal:  Positive for back pain.  Skin: Negative.  Negative for rash.  Neurological: Negative.  Negative for dizziness and headaches.  All other systems reviewed  and are negative.   Physical Exam Vitals reviewed.  Constitutional:      Appearance: Normal appearance.  HENT:     Head: Normocephalic.  Eyes:     Extraocular Movements: Extraocular movements intact.     Pupils: Pupils are equal, round, and reactive to light.  Cardiovascular:     Rate and Rhythm: Normal rate.  Pulmonary:     Effort: Pulmonary effort is normal.  Abdominal:     General: There is no distension.     Palpations: Abdomen is soft.     Tenderness: There is no abdominal tenderness.  Musculoskeletal:     Cervical back:  Normal range of motion.     Lumbar back: Spasms and tenderness present. Decreased range of motion.     Right lower leg: No edema.     Left lower leg: No edema.  Skin:    General: Skin is warm and dry.     Capillary Refill: Capillary refill takes less than 2 seconds.  Neurological:     General: No focal deficit present.     Mental Status: He is alert and oriented to person, place, and time.  Psychiatric:        Mood and Affect: Mood normal.        Behavior: Behavior normal.     ASSESSMENT & PLAN: Problem List Items Addressed This Visit       Cardiovascular and Mediastinum   Hypertension associated with type 2 diabetes mellitus ([place])    Well-controlled hypertension. BP Readings from Last 3 Encounters:  03/16/21 140/78  02/22/21 130/70  10/04/20 120/84  Continue Tenoretic 50-25 mg daily. Dietary approaches to stop hypertension discussed. Hemoglobin A1c done today.  Continue metformin and glipizide. Diet and nutrition discussed.       Relevant Orders   Comprehensive metabolic panel   Hemoglobin A1c     Endocrine   Dyslipidemia associated with type 2 diabetes mellitus ([place])    Diet and nutrition discussed.  Continue rosuvastatin 20 mg daily.       Relevant Orders   Lipid panel     Musculoskeletal and Integument   Lumbar degenerative disc disease    MRI of lumbar spine done 10 years ago showed degenerative disc disease of lumbar  spine we saw him disc herniation. Lumbar back pain with radiation to both buttocks persists despite NSAIDs and muscle relaxants prescribed 1 month ago. At this point he needs orthopedic evaluation and needs repeat MRI of lumbar spine. Depo-Medrol injection given today. Tramadol for pain as needed. Continue Flexeril and NSAIDs.      Relevant Medications   traMADol (ULTRAM) 50 MG tablet   Other Relevant Orders   Ambulatory referral to Orthopedic Surgery   MR Lumbar Spine Wo Contrast     Other   Lumbar pain - Primary   Relevant Medications   traMADol (ULTRAM) 50 MG tablet   Other Relevant Orders   Ambulatory referral to Orthopedic Surgery   MR Lumbar Spine Wo Contrast   Patient Instructions  Acute Back Pain, Adult Acute back pain is sudden and usually short-lived. It is often caused by an injury to the muscles and tissues in the back. The injury may result from: A muscle, tendon, or ligament getting overstretched or torn. Ligaments are tissues that connect bones to each other. Lifting something improperly can cause a back strain. Wear and tear (degeneration) of the spinal disks. Spinal disks are circular tissue that provide cushioning between the bones of the spine (vertebrae). Twisting motions, such as while playing sports or doing yard work. A hit to the back. Arthritis. You may have a physical exam, lab tests, and imaging tests to find the cause of your pain. Acute back pain usually goes away with rest and home care. Follow these instructions at home: Managing pain, stiffness, and swelling Take over-the-counter and prescription medicines only as told by your health care provider. Treatment may include medicines for pain and inflammation that are taken by mouth or applied to the skin, or muscle relaxants. Your health care provider may recommend applying ice during the first 24-48 hours after your pain starts. To do this: Put ice in  a plastic bag. Place a towel between your skin and  the bag. Leave the ice on for 20 minutes, 2-3 times a day. Remove the ice if your skin turns bright red. This is very important. If you cannot feel pain, heat, or cold, you have a greater risk of damage to the area. If directed, apply heat to the affected area as often as told by your health care provider. Use the heat source that your health care provider recommends, such as a moist heat pack or a heating pad. Place a towel between your skin and the heat source. Leave the heat on for 20-30 minutes. Remove the heat if your skin turns bright red. This is especially important if you are unable to feel pain, heat, or cold. You have a greater risk of getting burned. Activity  Do not stay in bed. Staying in bed for more than 1-2 days can delay your recovery. Sit up and stand up straight. Avoid leaning forward when you sit or hunching over when you stand. If you work at a desk, sit close to it so you do not need to lean over. Keep your chin tucked in. Keep your neck drawn back, and keep your elbows bent at a 90-degree angle (right angle). Sit high and close to the steering wheel when you drive. Add lower back (lumbar) support to your car seat, if needed. Take short walks on even surfaces as soon as you are able. Try to increase the length of time you walk each day. Do not sit, drive, or stand in one place for more than 30 minutes at a time. Sitting or standing for long periods of time can put stress on your back. Do not drive or use heavy machinery while taking prescription pain medicine. Use proper lifting techniques. When you bend and lift, use positions that put less stress on your back: [place] your knees. Keep the load close to your body. Avoid twisting. Exercise regularly as told by your health care provider. Exercising helps your back heal faster and helps prevent back injuries by keeping muscles strong and flexible. Work with a physical therapist to make a safe exercise program, as recommended by  your health care provider. Do any exercises as told by your physical therapist. Lifestyle Maintain a healthy weight. Extra weight puts stress on your back and makes it difficult to have good posture. Avoid activities or situations that make you feel anxious or stressed. Stress and anxiety increase muscle tension and can make back pain worse. Learn ways to manage anxiety and stress, such as through exercise. General instructions Sleep on a firm mattress in a comfortable position. Try lying on your side with your knees slightly bent. If you lie on your back, put a pillow under your knees. Keep your head and neck in a straight line with your spine (neutral position) when using electronic equipment like smartphones or pads. To do this: Raise your smartphone or pad to look at it instead of bending your head or neck to look down. Put the smartphone or pad at the level of your face while looking at the screen. Follow your treatment plan as told by your health care provider. This may include: Cognitive or behavioral therapy. Acupuncture or massage therapy. Meditation or yoga. Contact a health care provider if: You have pain that is not relieved with rest or medicine. You have increasing pain going down into your legs or buttocks. Your pain does not improve after 2 weeks. You have pain at  night. You lose weight without trying. You have a fever or chills. You develop nausea or vomiting. You develop abdominal pain. Get help right away if: You develop new bowel or bladder control problems. You have unusual weakness or numbness in your arms or legs. You feel faint. These symptoms may represent a serious problem that is an emergency. Do not wait to see if the symptoms will go away. Get medical help right away. Call your local emergency services (911 in the [place] Do not drive yourself to the hospital. Summary Acute back pain is sudden and usually short-lived. Use proper lifting techniques. When you  bend and lift, use positions that put less stress on your back. Take over-the-counter and prescription medicines only as told by your health care provider, and apply heat or ice as told. This information is not intended to replace advice given to you by your health care provider. Make sure you discuss any questions you have with your health care provider. Document Revised: 06/17/2020 Document Reviewed: 06/17/2020 Elsevier Patient Education  2022 Elsevier Inc.     Edwina Barth, MD [facility]

## 2021-03-16 NOTE — Addendum Note (Signed)
Addended by: Evie Lacks on: 03/16/2021 04:09 PM   Modules accepted: Orders

## 2021-03-16 NOTE — Assessment & Plan Note (Signed)
Well-controlled hypertension. BP Readings from Last 3 Encounters:  03/16/21 140/78  02/22/21 130/70  10/04/20 120/84  Continue Tenoretic 50-25 mg daily. Dietary approaches to stop hypertension discussed. Hemoglobin A1c done today.  Continue metformin and glipizide. Diet and nutrition discussed.

## 2021-03-16 NOTE — Assessment & Plan Note (Signed)
MRI of lumbar spine done 10 years ago showed degenerative disc disease of lumbar spine we saw him disc herniation. Lumbar back pain with radiation to both buttocks persists despite NSAIDs and muscle relaxants prescribed 1 month ago. At this point he needs orthopedic evaluation and needs repeat MRI of lumbar spine. Depo-Medrol injection given today. Tramadol for pain as needed. Continue Flexeril and NSAIDs.

## 2021-03-16 NOTE — Assessment & Plan Note (Signed)
Diet and nutrition discussed.  Continue rosuvastatin 20 mg daily. 

## 2021-03-20 ENCOUNTER — Encounter: Payer: Self-pay | Admitting: Emergency Medicine

## 2021-03-27 ENCOUNTER — Ambulatory Visit
Admission: RE | Admit: 2021-03-27 | Discharge: 2021-03-27 | Disposition: A | Discharge status: Home / Self Care | Payer: Self-pay | Source: Ambulatory Visit | Attending: Emergency Medicine | Admitting: Emergency Medicine

## 2021-03-27 ENCOUNTER — Other Ambulatory Visit: Payer: Self-pay

## 2021-03-27 DIAGNOSIS — M5136 Other intervertebral disc degeneration, lumbar region: Secondary | ICD-10-CM

## 2021-03-27 DIAGNOSIS — M545 Low back pain, unspecified: Secondary | ICD-10-CM

## 2021-03-28 ENCOUNTER — Other Ambulatory Visit: Payer: Self-pay

## 2021-03-28 DIAGNOSIS — M545 Low back pain, unspecified: Secondary | ICD-10-CM

## 2021-03-28 DIAGNOSIS — M62830 Muscle spasm of back: Secondary | ICD-10-CM

## 2021-03-28 MED ORDER — CYCLOBENZAPRINE HCL 10 MG PO TABS: 10.0000 mg | ORAL_TABLET | Freq: Every day | ORAL | 1 refills | Status: DC

## 2021-03-28 MED ORDER — DICLOFENAC SODIUM 75 MG PO TBEC
75.0000 mg | DELAYED_RELEASE_TABLET | Freq: Two times a day (BID) | ORAL | 1 refills | Status: DC
Start: 2021-03-28 — End: 2021-05-19

## 2021-03-28 NOTE — Telephone Encounter (Signed)
Called and spoke with pt and gave him MRI results. He states he has an upcomming OV with orthopedics in January. He states he would like a refill of flexeril and diclofenac gel.

## 2021-04-12 ENCOUNTER — Other Ambulatory Visit: Payer: Self-pay

## 2021-04-12 ENCOUNTER — Ambulatory Visit (INDEPENDENT_AMBULATORY_CARE_PROVIDER_SITE_OTHER): Payer: Self-pay | Admitting: Orthopaedic Surgery

## 2021-04-12 ENCOUNTER — Ambulatory Visit: Payer: Self-pay | Admitting: Orthopaedic Surgery

## 2021-04-12 ENCOUNTER — Other Ambulatory Visit: Payer: Self-pay | Admitting: Emergency Medicine

## 2021-04-12 VITALS — BP 134/85 | HR 72 | Ht 68.5 in | Wt 230.0 lb

## 2021-04-12 DIAGNOSIS — M5136 Other intervertebral disc degeneration, lumbar region: Secondary | ICD-10-CM

## 2021-04-12 DIAGNOSIS — I10 Essential (primary) hypertension: Secondary | ICD-10-CM

## 2021-04-16 NOTE — Progress Notes (Signed)
Office Visit Note   Patient: Larry Dawson           Date of Birth: [birth year]           MRN: [id number] Visit Date: 04/12/2021              Requested by: Georgina Quint, MD [address] [place],  [place] [address] PCP: Georgina Quint, MD   Assessment & Plan: Visit Diagnoses:  1. 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  Patient also has some moderate stenosis at L3-4 and a synovial cyst on the right at L5-S1 but this is small.  Patient is looking at some different insurance options and can return when he is ready to discuss epidural injection or surgical intervention.  We discussed with him with the disc extrusion surgery is certainly a possibility.  Is not having no bowel or bladder symptoms he knows progressive weakness would be a concern and he will contact us.  Follow-Up Instructions: No follow-ups on file.   Orders:  No orders of the defined types were placed in this encounter.  No orders of the defined types were placed in this encounter.     Procedures: No procedures performed   Clinical Data: No additional findings.   Subjective: Chief Complaint  Patient presents with   Lower Back - Pain    HPI 66 year old male new patient visit with low back pain.  Pain in lower back radiates in his left leg he states he had past history when he injured his quads felt a pop but did not have surgery on either he still ambulatory.  He states he has some weakness in his legs and has had chronic pain for years worse last 4 to 5 months.  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  He has had some numbness and tingling in his legs he is ambulating with a crutch currently.  Falls have occurred when he is climbing on or off the truck.  Patient does have hypertension hyperlipidemia, type 2  diabetes.  Review of Systems all the systems noncontributory to HPI.   Objective: Vital Signs: BP 134/85    Pulse 72    Ht 5' 8.5" (1.74 m)    Wt 230 lb (104.3 kg)    BMI 34.46 kg/m   Physical Exam Constitutional:      Appearance: He is well-developed.  HENT:     Head: Normocephalic and atraumatic.     Right Ear: External ear normal.     Left Ear: External ear normal.  Eyes:     Pupils: Pupils are equal, round, and reactive to light.  Neck:     Thyroid: No thyromegaly.     Trachea: No tracheal deviation.  Cardiovascular:     Rate and Rhythm: Normal rate.  Pulmonary:     Effort: Pulmonary effort is normal.     Breath sounds: No wheezing.  Abdominal:     General: Bowel sounds are normal.     Palpations: Abdomen is soft.  Musculoskeletal:     Cervical back: Neck supple.  Skin:    General: Skin is warm and dry.     Capillary Refill: Capillary refill takes less than 2 seconds.  Neurological:     Mental Status: He is alert and oriented to  person, place, and time.  Psychiatric:        Behavior: Behavior normal.        Thought Content: Thought content normal.        Judgment: Judgment normal.    Ortho Exam negative straight leg raising 90 degrees.  Patient has no palpable defects in the quad tendon both patella right or left does have slight quad weakness both right and left.  Mild sciatic notch tenderness negative logroll the hips anterior tib EHL is strong.  Specialty Comments:  No specialty comments available.  Imaging: Narrative & Impression  CLINICAL DATA:  66 year old male with low back pain, bilateral leg pain. No known injury.   EXAM: MRI LUMBAR SPINE WITHOUT CONTRAST   TECHNIQUE: Multiplanar, multisequence MR imaging of the lumbar spine was performed. No intravenous contrast was administered.   COMPARISON:  None.   FINDINGS: Segmentation: Lumbar segmentation appears to be normal and will be designated as such for this report.   Alignment:  Preserved  lumbar lordosis.  No spondylolisthesis.   Vertebrae: No marrow edema or evidence of acute osseous abnormality. Visualized bone marrow signal is within normal limits. Intact visible sacrum.   Conus medullaris and cauda equina: Conus extends to the L1 level. No lower spinal cord or conus signal abnormality.   Paraspinal and other soft tissues: Negative.   Disc levels:   There is a degree of congenital lumbar spinal canal narrowing due to short pedicle distance.   T12-L1:  Negative.   L1-L2: Subtle disc bulging eccentric to the left and primarily subarticular. No spinal or convincing lateral recess stenosis. Borderline to mild left L1 foraminal stenosis.   L2-L3: Complex moderate to large disc extrusion (series 4, image 6, series 3, image 6 and series 6, image 19) in the midline and eccentric to the right superimposed on circumferential disc bulging. Mostly caudal migration of disc with up to severe associated spinal and right lateral recess stenosis when superimposed on epidural lipomatosis. Mild bilateral L2 neural foraminal stenosis is fairly symmetric and related to disc bulging with endplate spurring.   L3-L4: Lobulated rightward circumferential disc bulge with endplate spurring. Superimposed mild facet and ligament flavum hypertrophy and mild epidural lipomatosis. Moderate spinal stenosis with mild right lateral recess stenosis (series 6, image 25). Mild left and mild to moderate right L3 neural foraminal stenosis.   L4-L5: Mild circumferential disc bulge with endplate spurring and facet hypertrophy. No significant stenosis.   L5-S1: Minor disc bulging and endplate spurring. Mild facet hypertrophy greater on the left. Also there is a small 3-4 mm left side synovial cyst suspected on series 6, images 35 and 36 (see also series 5, image 36). No spinal stenosis but moderate left lateral recess stenosis at the descending left S1 nerve level. No foraminal stenosis.    IMPRESSION: 1. 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 Subsequent severe spinal and right lateral recess stenosis. 2. 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 3. And moderate left lateral recess stenosis at L5-S1 in part due to a degenerative small synovial cyst.     Electronically Signed   By: Odessa Fleming M.D.   On: 03/28/2021 08:13     PMFS History: Patient Active Problem List   Diagnosis Date Noted   Lumbar degenerative disc disease 03/16/2021   Lumbar pain 03/16/2021   General weakness 10/04/2020   Erectile dysfunction 10/04/2020  Dyslipidemia associated with type 2 diabetes mellitus ([facility]) 08/06/2018   Dyslipidemia 08/06/2018   Hypertension associated with type 2 diabetes mellitus ([facility]) 07/18/2011   Past Medical History:  Diagnosis Date   Allergy    Anal fissure    COPD (chronic obstructive pulmonary disease) ([facility])    Albuterol PRN.   Hydrocele    Hyperlipidemia    Hypertension    IBS (irritable bowel syndrome) 05/22/13   per chart note-pt denied    Family History  Problem Relation Age of Onset   Diabetes Father    Hypertension Father    Diabetes Brother    Hypertension Brother    Diabetes Brother    Diabetes Brother     Past Surgical History:  Procedure Laterality Date   HERNIA REPAIR  prior to 2013   HYDROCELE EXCISION Bilateral 11/23/2013   Procedure: HYDROCELECTOMY ADULT;  Surgeon: Valetta Fulleravid S Grapey, MD;  [facility];  Service: Urology;  Laterality: Bilateral;   VASECTOMY  prior to 2013   Social History   Occupational History   Not on file  Tobacco Use   Smoking status: Former    Types: Cigarettes   Smokeless tobacco: Never  Vaping Use   Vaping Use: Never used  Substance and Sexual Activity   Alcohol use: Yes    Comment: rarely   Drug use: No   Sexual  activity: Not on file

## 2021-04-18 ENCOUNTER — Ambulatory Visit: Payer: Self-pay | Admitting: Orthopaedic Surgery

## 2021-04-19 ENCOUNTER — Telehealth: Payer: Self-pay | Admitting: Emergency Medicine

## 2021-04-19 NOTE — Telephone Encounter (Signed)
Returned patients call and scheduled an office visit to discuss applying for disability.

## 2021-04-19 NOTE — Telephone Encounter (Signed)
Patient wife calling in  Says patient wants to speak w/ provider regarding applying for disability  Please call [phone]

## 2021-05-01 ENCOUNTER — Telehealth (INDEPENDENT_AMBULATORY_CARE_PROVIDER_SITE_OTHER): Payer: Self-pay | Admitting: Emergency Medicine

## 2021-05-01 ENCOUNTER — Encounter: Payer: Self-pay | Admitting: Emergency Medicine

## 2021-05-01 ENCOUNTER — Other Ambulatory Visit: Payer: Self-pay

## 2021-05-01 DIAGNOSIS — M5136 Other intervertebral disc degeneration, lumbar region: Secondary | ICD-10-CM

## 2021-05-01 DIAGNOSIS — G8929 Other chronic pain: Secondary | ICD-10-CM

## 2021-05-01 DIAGNOSIS — M545 Low back pain, unspecified: Secondary | ICD-10-CM

## 2021-05-01 NOTE — Progress Notes (Signed)
Telemedicine Encounter- SOAP NOTE Established Patient MyChart video encounter Patient: Home  Provider: Office   Patient present only Wife also present via phone This telephone encounter was conducted with the patient's (or proxy's) verbal consent via audio telecommunications: yes/no: Yes Patient was instructed to have this encounter in a suitably private space; and to only have persons present to whom they give permission to participate. In addition, patient identity was confirmed by use of name plus two identifiers (DOB and address).  I discussed the limitations, risks, security and privacy concerns of performing an evaluation and management service by telephone and the availability of in person appointments. I also discussed with the patient that there may be a patient responsible charge related to this service. The patient expressed understanding and agreed to proceed.  I spent a total of TIME; 0 MIN TO 60 MIN: 15 minutes talking with the patient or their proxy.  Chief complaint: Inquiring about Social Security disability  Subjective   Larry Dawson is a 66 y.o. male established patient. Telephone visit today with questions regarding Social Security disability. Recent orthopedic evaluation on 04/12/2021 as follows: Assessment & Plan: Visit Diagnoses:  1. 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  Patient also has some moderate stenosis at L3-4 and a synovial cyst on the right at L5-S1 but this is small.  Patient is looking at some different insurance options and can return when he is ready to discuss epidural injection or surgical intervention.  We discussed with him with the disc extrusion surgery is certainly a possibility.  Is not having no bowel or bladder symptoms he knows progressive weakness would be a concern and he will contact us.     HPI   Patient Active Problem List   Diagnosis Date Noted   Lumbar degenerative disc disease 03/16/2021   Lumbar pain 03/16/2021   General weakness 10/04/2020   Erectile dysfunction 10/04/2020   Dyslipidemia associated with type 2 diabetes mellitus (HCC) 08/06/2018   Dyslipidemia 08/06/2018   Hypertension associated with type 2 diabetes mellitus (HCC) 07/18/2011    Past Medical History:  Diagnosis Date   Allergy    Anal fissure    COPD (chronic obstructive pulmonary disease) (HCC)    Albuterol PRN.   Hydrocele    Hyperlipidemia    Hypertension    IBS (irritable bowel syndrome) 05/22/13   per chart note-pt denied    Current Outpatient Medications  Medication Sig Dispense Refill   Ascorbic Acid (VITAMIN C PO) Take by mouth.     aspirin 81 MG tablet Take 81 mg by mouth daily.     atenolol-chlorthalidone (TENORETIC) 50-25 MG tablet TAKE 1 TABLET BY MOUTH DAILY 90 tablet 0   cetirizine (ZYRTEC) 10 MG tablet Take 1 tablet (10 mg total) by mouth daily. 30 tablet 0   cholecalciferol (VITAMIN D) 1000 units tablet Take 1,000 Units by mouth daily.     cyclobenzaprine (FLEXERIL) 10 MG tablet Take 1 tablet (10 mg total) by mouth at bedtime. 30 tablet 1   diclofenac (VOLTAREN) 75 MG EC tablet Take 1 tablet (75 mg total) by mouth 2 (two) times daily. Take only as needed for pain. 30 tablet 1   glipiZIDE (GLUCOTROL) 10 MG tablet Take 1 tablet (10 mg total) by mouth 2 (two) times daily before a meal. 60 tablet 3   metFORMIN (  GLUCOPHAGE) 1000 MG tablet TAKE 1 TABLET(1000 MG) BY MOUTH TWICE DAILY WITH A MEAL 180 tablet 3   Multiple Vitamins-Minerals (ZINC PO) Take by mouth.     Omega-3 Fatty Acids (FISH OIL) 1000 MG CAPS Take by mouth.     omeprazole (PRILOSEC) 20 MG capsule Take 20 mg by mouth daily.     rosuvastatin (CRESTOR) 20 MG tablet TAKE 1 TABLET(20 MG) BY MOUTH DAILY 90 tablet 3   sildenafil (VIAGRA) 100 MG tablet Take 0.5-1 tablets (50-100 mg total) by mouth daily as needed for  erectile dysfunction. 5 tablet 11   tiZANidine (ZANAFLEX) 4 MG tablet Take 0.5-1 tablets (2-4 mg total) by mouth every 6 (six) hours as needed for muscle spasms. (Patient not taking: Reported on 04/12/2021) 30 tablet 0   Turmeric POWD by Does not apply route daily.     No current facility-administered medications for this visit.    Allergies  Allergen Reactions   Ace Inhibitors Swelling    Suspected angioedema   Penicillins Hives, Rash and Other (See Comments)    Throat swells   Latex Hives, Swelling and Rash    Social History   Socioeconomic History   Marital status: Married    Spouse name: Not on file   Number of children: Not on file   Years of education: Not on file   Highest education level: Not on file  Occupational History   Not on file  Tobacco Use   Smoking status: Former    Types: Cigarettes   Smokeless tobacco: Never  Vaping Use   Vaping Use: Never used  Substance and Sexual Activity   Alcohol use: Yes    Comment: rarely   Drug use: No   Sexual activity: Not on file  Other Topics Concern   Not on file  Social History Narrative   Marital status: married      Employment: [employer]; drives all over.  Owns company.      Tobacco:  Quit smoking in 2005; smoked 25 years      Alcohol:  Rare beer      Drugs:  None      Exercise:  None   Daily caffeine    Social Determinants of Health   Financial Resource Strain: Not on file  Food Insecurity: Not on file  Transportation Needs: Not on file  Physical Activity: Not on file  Stress: Not on file  Social Connections: Not on file  Intimate Partner Violence: Not on file    Review of Systems  Constitutional: Negative.  Negative for chills and fever.  HENT: Negative.  Negative for congestion and sore throat.   Respiratory: Negative.  Negative for cough.   Cardiovascular:  Negative for chest pain.  Musculoskeletal:  Positive for back pain.  Skin: Negative.  Negative for rash.  Neurological:  Negative for  focal weakness.  All other systems reviewed and are negative.  Objective  Alert and oriented x3 in no apparent respiratory distress. Vitals as reported by the patient: There were no vitals filed for this visit.  Problem List Items Addressed This Visit       Musculoskeletal and Integument   Lumbar degenerative disc disease - Primary   Other Visit Diagnoses     Chronic bilateral low back pain, unspecified whether sciatica present         Social Security disability claim questions answered.     I discussed the assessment and treatment plan with the patient. The patient was provided an opportunity  to ask questions and all were answered. The patient agreed with the plan and demonstrated an understanding of the instructions.   The patient was advised to call back or seek an in-person evaluation if the symptoms worsen or if the condition fails to improve as anticipated.  I provided 15 minutes of non-face-to-face time during this encounter.  Georgina Quint, MD  Primary Care at [facility]

## 2021-05-15 ENCOUNTER — Other Ambulatory Visit: Payer: Self-pay | Admitting: Emergency Medicine

## 2021-05-15 DIAGNOSIS — M545 Low back pain, unspecified: Secondary | ICD-10-CM

## 2021-05-18 ENCOUNTER — Telehealth: Payer: Self-pay | Admitting: Emergency Medicine

## 2021-05-18 NOTE — Telephone Encounter (Signed)
Pt states he was told the reason  Diclofenac Sodium 75 MG was refused for refill was due to chronic kidney failure  Pt states he was not aware he had kidney disease, pt is requesting a c/b

## 2021-05-19 ENCOUNTER — Other Ambulatory Visit: Payer: Self-pay | Admitting: Emergency Medicine

## 2021-05-19 ENCOUNTER — Other Ambulatory Visit: Payer: Self-pay

## 2021-05-19 DIAGNOSIS — M545 Low back pain, unspecified: Secondary | ICD-10-CM

## 2021-05-19 MED ORDER — DICLOFENAC SODIUM 75 MG PO TBEC: 75.0000 mg | DELAYED_RELEASE_TABLET | Freq: Two times a day (BID) | ORAL | 1 refills | Status: DC

## 2021-05-19 NOTE — Progress Notes (Signed)
Patient is requesting a refill of the following medications: Requested Prescriptions   Pending Prescriptions Disp Refills   diclofenac (VOLTAREN) 75 MG EC tablet 30 tablet 1    Sig: Take 1 tablet (75 mg total) by mouth 2 (two) times daily. Take only as needed for pain.    Date of patient request: 05/17/21 Last office visit: 05/01/21 Date of last refill: 03/28/22 Last refill amount: 30,1 Follow up time period per chart: 05/30/21

## 2021-05-19 NOTE — Telephone Encounter (Signed)
Has slight decrease in GFR last time around. NSAID's can affect kidney function. Recommend to take Diclofenac only as needed and not every day.

## 2021-05-22 NOTE — Telephone Encounter (Signed)
Called and left vm with recommendations. 

## 2021-05-30 ENCOUNTER — Ambulatory Visit: Payer: Self-pay | Admitting: Emergency Medicine

## 2021-07-11 ENCOUNTER — Other Ambulatory Visit: Payer: Self-pay | Admitting: Emergency Medicine

## 2021-07-11 DIAGNOSIS — E1169 Type 2 diabetes mellitus with other specified complication: Secondary | ICD-10-CM

## 2021-07-11 DIAGNOSIS — I10 Essential (primary) hypertension: Secondary | ICD-10-CM

## 2021-11-28 ENCOUNTER — Other Ambulatory Visit: Payer: Self-pay | Admitting: Emergency Medicine

## 2021-11-28 DIAGNOSIS — I10 Essential (primary) hypertension: Secondary | ICD-10-CM

## 2021-12-04 ENCOUNTER — Emergency Department (HOSPITAL_COMMUNITY)
Admission: EM | Admit: 2021-12-04 | Discharge: 2021-12-04 | Disposition: A | Discharge status: Home / Self Care | Payer: Medicare Other | Attending: Student | Admitting: Student

## 2021-12-04 ENCOUNTER — Emergency Department (HOSPITAL_COMMUNITY): Payer: Medicare Other

## 2021-12-04 ENCOUNTER — Other Ambulatory Visit: Payer: Self-pay

## 2021-12-04 DIAGNOSIS — E1165 Type 2 diabetes mellitus with hyperglycemia: Secondary | ICD-10-CM | POA: Diagnosis not present

## 2021-12-04 DIAGNOSIS — R209 Unspecified disturbances of skin sensation: Secondary | ICD-10-CM | POA: Insufficient documentation

## 2021-12-04 DIAGNOSIS — R079 Chest pain, unspecified: Secondary | ICD-10-CM | POA: Diagnosis not present

## 2021-12-04 DIAGNOSIS — R109 Unspecified abdominal pain: Secondary | ICD-10-CM | POA: Insufficient documentation

## 2021-12-04 DIAGNOSIS — G9389 Other specified disorders of brain: Secondary | ICD-10-CM | POA: Diagnosis not present

## 2021-12-04 DIAGNOSIS — Z79899 Other long term (current) drug therapy: Secondary | ICD-10-CM | POA: Insufficient documentation

## 2021-12-04 DIAGNOSIS — Z743 Need for continuous supervision: Secondary | ICD-10-CM | POA: Diagnosis not present

## 2021-12-04 DIAGNOSIS — Y92410 Unspecified street and highway as the place of occurrence of the external cause: Secondary | ICD-10-CM | POA: Diagnosis not present

## 2021-12-04 DIAGNOSIS — M79602 Pain in left arm: Secondary | ICD-10-CM | POA: Diagnosis not present

## 2021-12-04 DIAGNOSIS — M79672 Pain in left foot: Secondary | ICD-10-CM | POA: Diagnosis not present

## 2021-12-04 DIAGNOSIS — Z7984 Long term (current) use of oral hypoglycemic drugs: Secondary | ICD-10-CM | POA: Insufficient documentation

## 2021-12-04 DIAGNOSIS — Z7982 Long term (current) use of aspirin: Secondary | ICD-10-CM | POA: Insufficient documentation

## 2021-12-04 DIAGNOSIS — R739 Hyperglycemia, unspecified: Secondary | ICD-10-CM | POA: Diagnosis not present

## 2021-12-04 DIAGNOSIS — S59912A Unspecified injury of left forearm, initial encounter: Secondary | ICD-10-CM | POA: Diagnosis not present

## 2021-12-04 DIAGNOSIS — M62830 Muscle spasm of back: Secondary | ICD-10-CM

## 2021-12-04 DIAGNOSIS — N200 Calculus of kidney: Secondary | ICD-10-CM | POA: Diagnosis not present

## 2021-12-04 DIAGNOSIS — I10 Essential (primary) hypertension: Secondary | ICD-10-CM | POA: Insufficient documentation

## 2021-12-04 DIAGNOSIS — Z91040 Latex allergy status: Secondary | ICD-10-CM | POA: Insufficient documentation

## 2021-12-04 DIAGNOSIS — Z041 Encounter for examination and observation following transport accident: Secondary | ICD-10-CM | POA: Diagnosis not present

## 2021-12-04 DIAGNOSIS — K760 Fatty (change of) liver, not elsewhere classified: Secondary | ICD-10-CM | POA: Diagnosis not present

## 2021-12-04 DIAGNOSIS — R918 Other nonspecific abnormal finding of lung field: Secondary | ICD-10-CM | POA: Diagnosis not present

## 2021-12-04 LAB — URINALYSIS, ROUTINE W REFLEX MICROSCOPIC
Bacteria, UA: NONE SEEN
Bilirubin Urine: NEGATIVE
Glucose, UA: >=500 mg/dL — AB
Hgb urine dipstick: NEGATIVE
Ketones, ur: 5 mg/dL — AB
Leukocytes,Ua: NEGATIVE
Nitrite: NEGATIVE
Protein, ur: NEGATIVE mg/dL
Specific Gravity, Urine: >1.046 — ABNORMAL HIGH (ref 1.005–1.030)
pH: 6 (ref 5.0–8.0)

## 2021-12-04 LAB — I-STAT CHEM 8, ED
BUN: 16 mg/dL (ref 8–23)
Calcium, Ion: 1.07 mmol/L — ABNORMAL LOW (ref 1.15–1.40)
Chloride: 93 mmol/L — ABNORMAL LOW (ref 98–111)
Creatinine, Ser: 1.2 mg/dL (ref 0.61–1.24)
Glucose, Bld: 517 mg/dL (ref 70–99)
HCT: 44 % (ref 39.0–52.0)
Hemoglobin: 15 g/dL (ref 13.0–17.0)
Potassium: 3.6 mmol/L (ref 3.5–5.1)
Sodium: 132 mmol/L — ABNORMAL LOW (ref 135–145)
TCO2: 26 mmol/L (ref 22–32)

## 2021-12-04 LAB — I-STAT VENOUS BLOOD GAS, ED
Acid-Base Excess: 3 mmol/L — ABNORMAL HIGH (ref 0.0–2.0)
Bicarbonate: 29.7 mmol/L — ABNORMAL HIGH (ref 20.0–28.0)
Calcium, Ion: 1.08 mmol/L — ABNORMAL LOW (ref 1.15–1.40)
HCT: 39 % (ref 39.0–52.0)
Hemoglobin: 13.3 g/dL (ref 13.0–17.0)
O2 Saturation: 99 %
Potassium: 3.6 mmol/L (ref 3.5–5.1)
Sodium: 131 mmol/L — ABNORMAL LOW (ref 135–145)
TCO2: 31 mmol/L (ref 22–32)
pCO2, Ven: 50.9 mmHg (ref 44–60)
pH, Ven: 7.374 (ref 7.25–7.43)
pO2, Ven: 173 mmHg — ABNORMAL HIGH (ref 32–45)

## 2021-12-04 LAB — CBG MONITORING, ED
Glucose-Capillary: 469 mg/dL — ABNORMAL HIGH (ref 70–99)
Glucose-Capillary: 311 mg/dL — ABNORMAL HIGH (ref 70–99)

## 2021-12-04 LAB — CBC
HCT: 41.5 % (ref 39.0–52.0)
Hemoglobin: 14.5 g/dL (ref 13.0–17.0)
MCH: 27.9 pg (ref 26.0–34.0)
MCHC: 34.9 g/dL (ref 30.0–36.0)
MCV: 80 fL (ref 80.0–100.0)
Platelets: 255 10*3/uL (ref 150–400)
RBC: 5.19 MIL/uL (ref 4.22–5.81)
RDW: 11.7 % (ref 11.5–15.5)
WBC: 6.3 10*3/uL (ref 4.0–10.5)
nRBC: 0 % (ref 0.0–0.2)

## 2021-12-04 MED ORDER — IOHEXOL 300 MG/ML  SOLN
100.0000 mL | Freq: Once | INTRAMUSCULAR | Status: AC | PRN
  Administered 2021-12-04: 100 mL via INTRAVENOUS

## 2021-12-04 MED ORDER — IBUPROFEN 600 MG PO TABS: 600.0000 mg | ORAL_TABLET | Freq: Four times a day (QID) | ORAL | 0 refills | Status: DC | PRN

## 2021-12-04 MED ORDER — CYCLOBENZAPRINE HCL 10 MG PO TABS: 10.0000 mg | ORAL_TABLET | Freq: Three times a day (TID) | ORAL | 0 refills | Status: DC | PRN

## 2021-12-04 MED ORDER — INSULIN ASPART 100 UNIT/ML IJ SOLN
10.0000 [IU] | Freq: Once | INTRAMUSCULAR | Status: AC
  Administered 2021-12-04: 10 [IU] via INTRAVENOUS

## 2021-12-04 MED ORDER — MORPHINE SULFATE (PF) 4 MG/ML IV SOLN
4.0000 mg | Freq: Once | INTRAVENOUS | Status: AC
  Administered 2021-12-04: 4 mg via INTRAVENOUS
  Filled 2021-12-04: qty 1

## 2021-12-04 MED ORDER — SODIUM CHLORIDE 0.9 % IV BOLUS
1000.0000 mL | Freq: Once | INTRAVENOUS | Status: AC
  Administered 2021-12-04: 1000 mL via INTRAVENOUS

## 2021-12-04 NOTE — Discharge Instructions (Signed)
You have been evaluated for your symptoms.  Fortunately no significant signs of bony injury or internal injury noted on today's exam.  Your blood sugar is quite high, please follow-up closely with your primary care doctor for recheck as you may need to be on insulin if your blood sugar remains elevated despite being on diabetic oral medication.  You may take ibuprofen and Flexeril as needed for aches and pain.  You may follow-up with EP doctor for Further care.  Return if you have any concern.
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
## 2021-12-06 ENCOUNTER — Encounter: Payer: Self-pay | Admitting: Emergency Medicine

## 2021-12-06 ENCOUNTER — Ambulatory Visit (INDEPENDENT_AMBULATORY_CARE_PROVIDER_SITE_OTHER): Payer: Medicare Other | Admitting: Emergency Medicine

## 2021-12-06 VITALS — BP 140/86 | HR 85 | Temp 98.1°F | Ht 68.5 in | Wt 226.4 lb

## 2021-12-06 DIAGNOSIS — E1169 Type 2 diabetes mellitus with other specified complication: Secondary | ICD-10-CM

## 2021-12-06 DIAGNOSIS — E1165 Type 2 diabetes mellitus with hyperglycemia: Secondary | ICD-10-CM | POA: Diagnosis not present

## 2021-12-06 DIAGNOSIS — E1159 Type 2 diabetes mellitus with other circulatory complications: Secondary | ICD-10-CM | POA: Diagnosis not present

## 2021-12-06 DIAGNOSIS — I152 Hypertension secondary to endocrine disorders: Secondary | ICD-10-CM

## 2021-12-06 DIAGNOSIS — E785 Hyperlipidemia, unspecified: Secondary | ICD-10-CM | POA: Diagnosis not present

## 2021-12-06 LAB — LIPID PANEL
Cholesterol: 132 mg/dL (ref 0–200)
HDL: 36.2 mg/dL — ABNORMAL LOW (ref >39.00)
NonHDL: 95.39
Total CHOL/HDL Ratio: 4
Triglycerides: 208 mg/dL — ABNORMAL HIGH (ref 0.0–149.0)
VLDL: 41.6 mg/dL — ABNORMAL HIGH (ref 0.0–40.0)

## 2021-12-06 LAB — COMPREHENSIVE METABOLIC PANEL
ALT: 25 U/L (ref 0–53)
AST: 18 U/L (ref 0–37)
Albumin: 4 g/dL (ref 3.5–5.2)
Alkaline Phosphatase: 79 U/L (ref 39–117)
BUN: 18 mg/dL (ref 6–23)
CO2: 28 mEq/L (ref 19–32)
Calcium: 9.2 mg/dL (ref 8.4–10.5)
Chloride: 93 mEq/L — ABNORMAL LOW (ref 96–112)
Creatinine, Ser: 1.28 mg/dL (ref 0.40–1.50)
GFR: 58.9 mL/min — ABNORMAL LOW (ref >60.00)
Glucose, Bld: 498 mg/dL — ABNORMAL HIGH (ref 70–99)
Potassium: 4.1 mEq/L (ref 3.5–5.1)
Sodium: 130 mEq/L — ABNORMAL LOW (ref 135–145)
Total Bilirubin: 0.8 mg/dL (ref 0.2–1.2)
Total Protein: 7.5 g/dL (ref 6.0–8.3)

## 2021-12-06 LAB — POCT GLYCOSYLATED HEMOGLOBIN (HGB A1C): Hemoglobin A1C: 12.4 % — AB (ref 4.0–5.6)

## 2021-12-06 LAB — CBC WITH DIFFERENTIAL/PLATELET
Basophils Absolute: 0.1 10*3/uL (ref 0.0–0.1)
Basophils Relative: 1 % (ref 0.0–3.0)
Eosinophils Absolute: 0.1 10*3/uL (ref 0.0–0.7)
Eosinophils Relative: 0.8 % (ref 0.0–5.0)
HCT: 41.5 % (ref 39.0–52.0)
Hemoglobin: 14.1 g/dL (ref 13.0–17.0)
Lymphocytes Relative: 28 % (ref 12.0–46.0)
Lymphs Abs: 1.9 10*3/uL (ref 0.7–4.0)
MCHC: 34.1 g/dL (ref 30.0–36.0)
MCV: 82.5 fl (ref 78.0–100.0)
Monocytes Absolute: 0.6 10*3/uL (ref 0.1–1.0)
Monocytes Relative: 8.3 % (ref 3.0–12.0)
Neutro Abs: 4.1 10*3/uL (ref 1.4–7.7)
Neutrophils Relative %: 61.9 % (ref 43.0–77.0)
Platelets: 256 10*3/uL (ref 150.0–400.0)
RBC: 5.03 Mil/uL (ref 4.22–5.81)
RDW: 13 % (ref 11.5–15.5)
WBC: 6.6 10*3/uL (ref 4.0–10.5)

## 2021-12-06 LAB — MICROALBUMIN / CREATININE URINE RATIO
Creatinine,U: 61.1 mg/dL
Microalb Creat Ratio: 1.1 mg/g (ref 0.0–30.0)
Microalb, Ur: <0.7 mg/dL (ref 0.0–1.9)

## 2021-12-06 LAB — GLUCOSE, POCT (MANUAL RESULT ENTRY): POC Glucose: 588 mg/dl — AB (ref 70–99)

## 2021-12-06 LAB — LDL CHOLESTEROL, DIRECT: Direct LDL: 68 mg/dL

## 2021-12-06 MED ORDER — DAPAGLIFLOZIN PROPANEDIOL 10 MG PO TABS: 10.0000 mg | ORAL_TABLET | Freq: Every day | ORAL | 1 refills | Status: DC

## 2021-12-06 MED ORDER — TIRZEPATIDE 5 MG/0.5ML ~~LOC~~ SOAJ: 5.0000 mg | SUBCUTANEOUS | 3 refills | Status: DC

## 2021-12-06 NOTE — Assessment & Plan Note (Signed)
Well-controlled hypertension. Continue Tenoretic 50-25 mg daily. Uncontrolled diabetes with elevated hemoglobin A1c Lab Results  Component Value Date   HGBA1C 12.4 (A) 12/06/2021  Continue metformin 1000 mg twice a day and glipizide 10 mg twice a day Recommend to start Mounjaro 0.5 mg weekly and Farxiga 10 mg daily Hypoglycemia precautions given. Plan is to discontinue glipizide at some point when he is more stable Diet and nutrition discussed Cardiovascular risks associated with uncontrolled diabetes discussed Follow-up in 4 weeks

## 2021-12-06 NOTE — Patient Instructions (Signed)
Continue metformin and glipizide. Start Mounjaro 0.5 mg weekly Start Farxiga 10 mg daily Follow-up in 4 to 5 weeks  Diabetes Mellitus and Nutrition, Adult When you have diabetes, or diabetes mellitus, it is very important to have healthy eating habits because your blood sugar (glucose) levels are greatly affected by what you eat and drink. Eating healthy foods in the right amounts, at about the same times every day, can help you: Manage your blood glucose. Lower your risk of heart disease. Improve your blood pressure. Reach or maintain a healthy weight. What can affect my meal plan? Every person with diabetes is different, and each person has different needs for a meal plan. Your health care provider may recommend that you work with a dietitian to make a meal plan that is best for you. Your meal plan may vary depending on factors such as: The calories you need. The medicines you take. Your weight. Your blood glucose, blood pressure, and cholesterol levels. Your activity level. Other health conditions you have, such as heart or kidney disease. How do carbohydrates affect me? Carbohydrates, also called carbs, affect your blood glucose level more than any other type of food. Eating carbs raises the amount of glucose in your blood. It is important to know how many carbs you can safely have in each meal. This is different for every person. Your dietitian can help you calculate how many carbs you should have at each meal and for each snack. How does alcohol affect me? Alcohol can cause a decrease in blood glucose (hypoglycemia), especially if you use insulin or take certain diabetes medicines by mouth. Hypoglycemia can be a life-threatening condition. Symptoms of hypoglycemia, such as sleepiness, dizziness, and confusion, are similar to symptoms of having too much alcohol. Do not drink alcohol if: Your health care provider tells you not to drink. You are pregnant, may be pregnant, or are planning  to become pregnant. If you drink alcohol: Limit how much you have to: 0-1 drink a day for women. 0-2 drinks a day for men. Know how much alcohol is in your drink. In the [place] one drink equals one 12 oz bottle of beer (355 mL), one 5 oz glass of wine (148 mL), or one 1 oz glass of hard liquor (44 mL). Keep yourself hydrated with water, diet soda, or unsweetened iced tea. Keep in mind that regular soda, juice, and other mixers may contain a lot of sugar and must be counted as carbs. What are tips for following this plan?  Reading food labels Start by checking the serving size on the Nutrition Facts label of packaged foods and drinks. The number of calories and the amount of carbs, fats, and other nutrients listed on the label are based on one serving of the item. Many items contain more than one serving per package. Check the total grams (g) of carbs in one serving. Check the number of grams of saturated fats and trans fats in one serving. Choose foods that have a low amount or none of these fats. Check the number of milligrams (mg) of salt (sodium) in one serving. Most people should limit total sodium intake to less than 2,300 mg per day. Always check the nutrition information of foods labeled as "low-fat" or "nonfat." These foods may be higher in added sugar or refined carbs and should be avoided. Talk to your dietitian to identify your daily goals for nutrients listed on the label. Shopping Avoid buying canned, pre-made, or processed foods. These foods tend to  be high in fat, sodium, and added sugar. Shop around the outside edge of the grocery store. This is where you will most often find fresh fruits and vegetables, bulk grains, fresh meats, and fresh dairy products. Cooking Use low-heat cooking methods, such as baking, instead of high-heat cooking methods, such as deep frying. Cook using healthy oils, such as olive, canola, or sunflower oil. Avoid cooking with butter, cream, or high-fat  meats. Meal planning Eat meals and snacks regularly, preferably at the same times every day. Avoid going long periods of time without eating. Eat foods that are high in fiber, such as fresh fruits, vegetables, beans, and whole grains. Eat 4-6 oz (112-168 g) of lean protein each day, such as lean meat, chicken, fish, eggs, or tofu. One ounce (oz) (28 g) of lean protein is equal to: 1 oz (28 g) of meat, chicken, or fish. 1 egg.  cup (62 g) of tofu. Eat some foods each day that contain healthy fats, such as avocado, nuts, seeds, and fish. What foods should I eat? Fruits Berries. Apples. Oranges. Peaches. Apricots. Plums. Grapes. Mangoes. Papayas. Pomegranates. Kiwi. Cherries. Vegetables Leafy greens, including lettuce, spinach, kale, chard, collard greens, mustard greens, and cabbage. Beets. Cauliflower. Broccoli. Carrots. Green beans. Tomatoes. Peppers. Onions. Cucumbers. Brussels sprouts. Grains Whole grains, such as whole-wheat or whole-grain bread, crackers, tortillas, cereal, and pasta. Unsweetened oatmeal. Quinoa. Brown or wild rice. Meats and other proteins Seafood. Poultry without skin. Lean cuts of poultry and beef. Tofu. Nuts. Seeds. Dairy Low-fat or fat-free dairy products such as milk, yogurt, and cheese. The items listed above may not be a complete list of foods and beverages you can eat and drink. Contact a dietitian for more information. What foods should I avoid? Fruits Fruits canned with syrup. Vegetables Canned vegetables. Frozen vegetables with butter or cream sauce. Grains Refined white flour and flour products such as bread, pasta, snack foods, and cereals. Avoid all processed foods. Meats and other proteins Fatty cuts of meat. Poultry with skin. Breaded or fried meats. Processed meat. Avoid saturated fats. Dairy Full-fat yogurt, cheese, or milk. Beverages Sweetened drinks, such as soda or iced tea. The items listed above may not be a complete list of foods and  beverages you should avoid. Contact a dietitian for more information. Questions to ask a health care provider Do I need to meet with a certified diabetes care and education specialist? Do I need to meet with a dietitian? What number can I call if I have questions? When are the best times to check my blood glucose? Where to find more information: American Diabetes Association: diabetes.org Academy of Nutrition and Dietetics: eatright.[employer] of Diabetes and Digestive and Kidney Diseases: AmenCredit.is Association of Diabetes Care & Education Specialists: diabeteseducator.org Summary It is important to have healthy eating habits because your blood sugar (glucose) levels are greatly affected by what you eat and drink. It is important to use alcohol carefully. A healthy meal plan will help you manage your blood glucose and lower your risk of heart disease. Your health care provider may recommend that you work with a dietitian to make a meal plan that is best for you. This information is not intended to replace advice given to you by your health care provider. Make sure you discuss any questions you have with your health care provider. Document Revised: 10/28/2019 Document Reviewed: 10/28/2019 Elsevier Patient Education  [place].

## 2021-12-06 NOTE — Assessment & Plan Note (Signed)
Diet and nutrition discussed. Continue rosuvastatin 20 mg daily. Lipid profile done today. Follow-up in 4 weeks.

## 2021-12-06 NOTE — Progress Notes (Signed)
Larry Dawson 66 y.o.   Chief Complaint  Patient presents with   Follow-up   Diabetes    Elevated DM     HISTORY OF PRESENT ILLNESS: This is a 66 y.o. male complaining of uncontrolled diabetes.  HPI   Prior to Admission medications   Medication Sig Start Date End Date Taking? Authorizing Provider  Ascorbic Acid (VITAMIN C PO) Take by mouth.   Yes [provider]  aspirin 81 MG tablet Take 81 mg by mouth daily.   Yes [provider]  atenolol-chlorthalidone (TENORETIC) 50-25 MG tablet TAKE 1 TABLET BY MOUTH DAILY 11/28/21 02/26/22 Yes Susana Duell, Eilleen Kempf, MD  cetirizine (ZYRTEC) 10 MG tablet Take 1 tablet (10 mg total) by mouth daily. 05/10/17  Yes Linus Mako B, NP  cholecalciferol (VITAMIN D) 1000 units tablet Take 1,000 Units by mouth daily.   Yes [provider]  cyclobenzaprine (FLEXERIL) 10 MG tablet Take 1 tablet (10 mg total) by mouth 3 (three) times daily as needed for muscle spasms. 12/04/21  Yes Fayrene Helper, PA-C  diclofenac (VOLTAREN) 75 MG EC tablet Take 1 tablet (75 mg total) by mouth 2 (two) times daily. Take only as needed for pain. 05/19/21  Yes Emersyn Kotarski, Eilleen Kempf, MD  glipiZIDE (GLUCOTROL) 10 MG tablet Take 1 tablet (10 mg total) by mouth 2 (two) times daily before a meal. 03/16/21  Yes Markanthony Gedney, Eilleen Kempf, MD  ibuprofen (ADVIL) 600 MG tablet Take 1 tablet (600 mg total) by mouth every 6 (six) hours as needed. 12/04/21  Yes Fayrene Helper, PA-C  metFORMIN (GLUCOPHAGE) 1000 MG tablet TAKE 1 TABLET(1000 MG) BY MOUTH TWICE DAILY WITH A MEAL 03/10/21  Yes Frady Taddeo, Eilleen Kempf, MD  Multiple Vitamins-Minerals (ZINC PO) Take by mouth.   Yes [provider]  Omega-3 Fatty Acids (FISH OIL) 1000 MG CAPS Take by mouth.   Yes [provider]  omeprazole (PRILOSEC) 20 MG capsule Take 20 mg by mouth daily.   Yes [provider]  rosuvastatin (CRESTOR) 20 MG tablet TAKE 1 TABLET(20 MG) BY MOUTH DAILY 07/11/21  Yes Kacy Hegna,  Eilleen Kempf, MD  sildenafil (VIAGRA) 100 MG tablet Take 0.5-1 tablets (50-100 mg total) by mouth daily as needed for erectile dysfunction. 10/04/20  Yes Aleesia Henney, Eilleen Kempf, MD  Turmeric POWD by Does not apply route daily.   Yes [provider]    Allergies  Allergen Reactions   Ace Inhibitors Swelling    Suspected angioedema   Penicillins Hives, Rash and Other (See Comments)    Throat swells   Latex Hives, Swelling and Rash    Patient Active Problem List   Diagnosis Date Noted   Lumbar degenerative disc disease 03/16/2021   Lumbar pain 03/16/2021   General weakness 10/04/2020   Erectile dysfunction 10/04/2020   Dyslipidemia associated with type 2 diabetes mellitus (HCC) 08/06/2018   Dyslipidemia 08/06/2018   Hypertension associated with type 2 diabetes mellitus (HCC) 07/18/2011    Past Medical History:  Diagnosis Date   Allergy    Anal fissure    COPD (chronic obstructive pulmonary disease) (HCC)    Albuterol PRN.   Hydrocele    Hyperlipidemia    Hypertension    IBS (irritable bowel syndrome) 05/22/13   per chart note-pt denied    Past Surgical History:  Procedure Laterality Date   HERNIA REPAIR  prior to 2013   HYDROCELE EXCISION Bilateral 11/23/2013   Procedure: HYDROCELECTOMY ADULT;  Surgeon: Valetta Fuller, MD;  Location: [facility];  Service: Urology;  Laterality: Bilateral;   VASECTOMY  prior to 2013    Social History   Socioeconomic History   Marital status: Married    Spouse name: Not on file   Number of children: Not on file   Years of education: Not on file   Highest education level: Not on file  Occupational History   Not on file  Tobacco Use   Smoking status: Former    Types: Cigarettes   Smokeless tobacco: Never  Vaping Use   Vaping Use: Never used  Substance and Sexual Activity   Alcohol use: Yes    Comment: rarely   Drug use: No   Sexual activity: Not on file  Other Topics Concern   Not on file  Social  History Narrative   Marital status: married      Employment: [employer]; drives all over.  Owns company.      Tobacco:  Quit smoking in 2005; smoked 25 years      Alcohol:  Rare beer      Drugs:  None      Exercise:  None   Daily caffeine    Social Determinants of Health   Financial Resource Strain: Not on file  Food Insecurity: Not on file  Transportation Needs: Not on file  Physical Activity: Not on file  Stress: Not on file  Social Connections: Not on file  Intimate Partner Violence: Not on file    Family History  Problem Relation Age of Onset   Diabetes Father    Hypertension Father    Diabetes Brother    Hypertension Brother    Diabetes Brother    Diabetes Brother      Review of Systems  Constitutional: Negative.  Negative for chills and fever.  HENT: Negative.  Negative for congestion and sore throat.   Respiratory: Negative.  Negative for cough and shortness of breath.   Cardiovascular: Negative.  Negative for chest pain and palpitations.  Gastrointestinal:  Negative for abdominal pain, nausea and vomiting.  Genitourinary:  Positive for frequency.  Skin: Negative.  Negative for rash.  Neurological:  Negative for dizziness and headaches.  Endo/Heme/Allergies:  Positive for polydipsia.  All other systems reviewed and are negative.   Today's Vitals   12/06/21 0933  BP: (!) 140/86  Pulse: 85  Temp: 98.1 F (36.7 C)  TempSrc: Oral  SpO2: 98%  Weight: 226 lb 6 oz (102.7 kg)  Height: 5' 8.5" (1.74 m)   Body mass index is 33.92 kg/m.  Physical Exam Vitals reviewed.  Constitutional:      Appearance: Normal appearance.  HENT:     Head: Normocephalic.     Mouth/Throat:     Mouth: Mucous membranes are moist.     Pharynx: Oropharynx is clear.  Eyes:     Extraocular Movements: Extraocular movements intact.     Conjunctiva/sclera: Conjunctivae normal.     Pupils: Pupils are equal, round, and reactive to light.  Cardiovascular:     Rate and  Rhythm: Normal rate and regular rhythm.     Pulses: Normal pulses.     Heart sounds: Normal heart sounds.  Pulmonary:     Effort: Pulmonary effort is normal.     Breath sounds: Normal breath sounds.  Abdominal:     Palpations: Abdomen is soft.     Tenderness: There is no abdominal tenderness.  Musculoskeletal:     Cervical back: No tenderness.  Lymphadenopathy:     Cervical: No cervical adenopathy.  Skin:  General: Skin is warm and dry.     Capillary Refill: Capillary refill takes less than 2 seconds.  Neurological:     General: No focal deficit present.     Mental Status: He is alert and oriented to person, place, and time.  Psychiatric:        Mood and Affect: Mood normal.        Behavior: Behavior normal.     Results for orders placed or performed in visit on 12/06/21 (from the past 24 hour(s))  POCT HgB A1C     Status: Abnormal   Collection Time: 12/06/21  9:50 AM  Result Value Ref Range   Hemoglobin A1C 12.4 (A) 4.0 - 5.6 %   HbA1c POC (<> result, manual entry)     HbA1c, POC (prediabetic range)     HbA1c, POC (controlled diabetic range)    POCT Glucose (CBG)     Status: Abnormal   Collection Time: 12/06/21  9:50 AM  Result Value Ref Range   POC Glucose 588 (A) 70 - 99 mg/dl    ASSESSMENT & PLAN: A total of 51 minutes was spent with the patient and counseling/coordination of care regarding preparing for this visit, review of most recent office visit notes, review of most recent blood work results including interpretation of today's hemoglobin A1c, review of all medications and changes made, education on nutrition, cardiovascular risks associated with uncontrolled diabetes, prognosis, documentation, and need for follow-up.  Problem List Items Addressed This Visit       Cardiovascular and Mediastinum   Hypertension associated with type 2 diabetes mellitus (HCC)    Well-controlled hypertension. Continue Tenoretic 50-25 mg daily. Uncontrolled diabetes with elevated  hemoglobin A1c Lab Results  Component Value Date   HGBA1C 12.4 (A) 12/06/2021  Continue metformin 1000 mg twice a day and glipizide 10 mg twice a day Recommend to start Mounjaro 0.5 mg weekly and Farxiga 10 mg daily Hypoglycemia precautions given. Plan is to discontinue glipizide at some point when he is more stable Diet and nutrition discussed Cardiovascular risks associated with uncontrolled diabetes discussed Follow-up in 4 weeks       Relevant Medications   tirzepatide (MOUNJARO) 5 MG/0.5ML Pen   dapagliflozin propanediol (FARXIGA) 10 MG TABS tablet     Endocrine   Dyslipidemia associated with type 2 diabetes mellitus (HCC)    Diet and nutrition discussed. Continue rosuvastatin 20 mg daily. Lipid profile done today. Follow-up in 4 weeks.      Relevant Medications   tirzepatide (MOUNJARO) 5 MG/0.5ML Pen   dapagliflozin propanediol (FARXIGA) 10 MG TABS tablet   Other Visit Diagnoses     Type 2 diabetes mellitus with hyperglycemia, without long-term current use of insulin (HCC)    -  Primary   Relevant Medications   tirzepatide (MOUNJARO) 5 MG/0.5ML Pen   dapagliflozin propanediol (FARXIGA) 10 MG TABS tablet   Other Relevant Orders   POCT HgB A1C (Completed)   POCT Glucose (CBG) (Completed)   Urine Microalbumin w/creat. ratio   CBC with Differential/Platelet   Comprehensive metabolic panel   Lipid panel      Patient Instructions  Continue metformin and glipizide. Start Mounjaro 0.5 mg weekly Start Farxiga 10 mg daily Follow-up in 4 to 5 weeks  Diabetes Mellitus and Nutrition, Adult When you have diabetes, or diabetes mellitus, it is very important to have healthy eating habits because your blood sugar (glucose) levels are greatly affected by what you eat and drink. Eating healthy foods in the right  amounts, at about the same times every day, can help you: Manage your blood glucose. Lower your risk of heart disease. Improve your blood pressure. Reach or  maintain a healthy weight. What can affect my meal plan? Every person with diabetes is different, and each person has different needs for a meal plan. Your health care provider may recommend that you work with a dietitian to make a meal plan that is best for you. Your meal plan may vary depending on factors such as: The calories you need. The medicines you take. Your weight. Your blood glucose, blood pressure, and cholesterol levels. Your activity level. Other health conditions you have, such as heart or kidney disease. How do carbohydrates affect me? Carbohydrates, also called carbs, affect your blood glucose level more than any other type of food. Eating carbs raises the amount of glucose in your blood. It is important to know how many carbs you can safely have in each meal. This is different for every person. Your dietitian can help you calculate how many carbs you should have at each meal and for each snack. How does alcohol affect me? Alcohol can cause a decrease in blood glucose (hypoglycemia), especially if you use insulin or take certain diabetes medicines by mouth. Hypoglycemia can be a life-threatening condition. Symptoms of hypoglycemia, such as sleepiness, dizziness, and confusion, are similar to symptoms of having too much alcohol. Do not drink alcohol if: Your health care provider tells you not to drink. You are pregnant, may be pregnant, or are planning to become pregnant. If you drink alcohol: Limit how much you have to: 0-1 drink a day for women. 0-2 drinks a day for men. Know how much alcohol is in your drink. In the [place] one drink equals one 12 oz bottle of beer (355 mL), one 5 oz glass of wine (148 mL), or one 1 oz glass of hard liquor (44 mL). Keep yourself hydrated with water, diet soda, or unsweetened iced tea. Keep in mind that regular soda, juice, and other mixers may contain a lot of sugar and must be counted as carbs. What are tips for following this  plan?  Reading food labels Start by checking the serving size on the Nutrition Facts label of packaged foods and drinks. The number of calories and the amount of carbs, fats, and other nutrients listed on the label are based on one serving of the item. Many items contain more than one serving per package. Check the total grams (g) of carbs in one serving. Check the number of grams of saturated fats and trans fats in one serving. Choose foods that have a low amount or none of these fats. Check the number of milligrams (mg) of salt (sodium) in one serving. Most people should limit total sodium intake to less than 2,300 mg per day. Always check the nutrition information of foods labeled as "low-fat" or "nonfat." These foods may be higher in added sugar or refined carbs and should be avoided. Talk to your dietitian to identify your daily goals for nutrients listed on the label. Shopping Avoid buying canned, pre-made, or processed foods. These foods tend to be high in fat, sodium, and added sugar. Shop around the outside edge of the grocery store. This is where you will most often find fresh fruits and vegetables, bulk grains, fresh meats, and fresh dairy products. Cooking Use low-heat cooking methods, such as baking, instead of high-heat cooking methods, such as deep frying. Cook using healthy oils, such as olive, canola,  or sunflower oil. Avoid cooking with butter, cream, or high-fat meats. Meal planning Eat meals and snacks regularly, preferably at the same times every day. Avoid going long periods of time without eating. Eat foods that are high in fiber, such as fresh fruits, vegetables, beans, and whole grains. Eat 4-6 oz (112-168 g) of lean protein each day, such as lean meat, chicken, fish, eggs, or tofu. One ounce (oz) (28 g) of lean protein is equal to: 1 oz (28 g) of meat, chicken, or fish. 1 egg.  cup (62 g) of tofu. Eat some foods each day that contain healthy fats, such as avocado,  nuts, seeds, and fish. What foods should I eat? Fruits Berries. Apples. Oranges. Peaches. Apricots. Plums. Grapes. Mangoes. Papayas. Pomegranates. Kiwi. Cherries. Vegetables Leafy greens, including lettuce, spinach, kale, chard, collard greens, mustard greens, and cabbage. Beets. Cauliflower. Broccoli. Carrots. Green beans. Tomatoes. Peppers. Onions. Cucumbers. Brussels sprouts. Grains Whole grains, such as whole-wheat or whole-grain bread, crackers, tortillas, cereal, and pasta. Unsweetened oatmeal. Quinoa. Brown or wild rice. Meats and other proteins Seafood. Poultry without skin. Lean cuts of poultry and beef. Tofu. Nuts. Seeds. Dairy Low-fat or fat-free dairy products such as milk, yogurt, and cheese. The items listed above may not be a complete list of foods and beverages you can eat and drink. Contact a dietitian for more information. What foods should I avoid? Fruits Fruits canned with syrup. Vegetables Canned vegetables. Frozen vegetables with butter or cream sauce. Grains Refined white flour and flour products such as bread, pasta, snack foods, and cereals. Avoid all processed foods. Meats and other proteins Fatty cuts of meat. Poultry with skin. Breaded or fried meats. Processed meat. Avoid saturated fats. Dairy Full-fat yogurt, cheese, or milk. Beverages Sweetened drinks, such as soda or iced tea. The items listed above may not be a complete list of foods and beverages you should avoid. Contact a dietitian for more information. Questions to ask a health care provider Do I need to meet with a certified diabetes care and education specialist? Do I need to meet with a dietitian? What number can I call if I have questions? When are the best times to check my blood glucose? Where to find more information: American Diabetes Association: diabetes.org Academy of Nutrition and Dietetics: eatright.[employer] of Diabetes and Digestive and Kidney Diseases:  StageSync.si Association of Diabetes Care & Education Specialists: diabeteseducator.org Summary It is important to have healthy eating habits because your blood sugar (glucose) levels are greatly affected by what you eat and drink. It is important to use alcohol carefully. A healthy meal plan will help you manage your blood glucose and lower your risk of heart disease. Your health care provider may recommend that you work with a dietitian to make a meal plan that is best for you. This information is not intended to replace advice given to you by your health care provider. Make sure you discuss any questions you have with your health care provider. Document Revised: 10/28/2019 Document Reviewed: 10/28/2019 Elsevier Patient Education  2023 Elsevier Inc.    Edwina Barth, MD [facility]

## 2021-12-07 ENCOUNTER — Telehealth: Payer: Self-pay | Admitting: Emergency Medicine

## 2021-12-07 NOTE — Telephone Encounter (Signed)
Called patient to clarify which medication was expensive. Patient states he thinks it was the [facility]. I informed patient I will call the pharmacy to see which medication it was since he was prescribed two medications yesterday. Patient is going to call his insurance company to see what alternatives is approved by his insurance. He will call us back with this information

## 2021-12-07 NOTE — Telephone Encounter (Signed)
Thank you :)

## 2021-12-07 NOTE — Telephone Encounter (Signed)
Patient went to pickup insulin and it was $1000 - please send in something cheaper - patient can not afford.  Patient also said that he did not feel good last night and threw up.

## 2021-12-07 NOTE — Telephone Encounter (Signed)
Patient called pharmacy and got alternatives that are covered by her insurance.  Humalog Humulin Lispro Lantus Levemir Soliqua Evaristo Bury Claris Gladden and Marcelline Deist is not covered

## 2021-12-07 NOTE — Telephone Encounter (Signed)
Called patient and informed him of provider recommendation of alternatives for [place]. Patient is going to call his insurance company to see if Trulicity, Ozempic and [place] Pepper is covered

## 2021-12-08 NOTE — Telephone Encounter (Signed)
Patient states that his blood sugar is 500 this morning - he needs one of the 3 alternative prescriptions listed called in to his pharmacy - [facility] on [address] in [place] - Please contact patient this morning and let him know what is going to be called in.

## 2021-12-11 ENCOUNTER — Other Ambulatory Visit: Payer: Self-pay | Admitting: Emergency Medicine

## 2021-12-11 DIAGNOSIS — E1165 Type 2 diabetes mellitus with hyperglycemia: Secondary | ICD-10-CM

## 2021-12-11 MED ORDER — EMPAGLIFLOZIN 10 MG PO TABS: 10.0000 mg | ORAL_TABLET | Freq: Every day | ORAL | 3 refills | Status: DC

## 2021-12-11 MED ORDER — OZEMPIC (0.25 OR 0.5 MG/DOSE) 2 MG/3ML ~~LOC~~ SOPN: 0.5000 mg | PEN_INJECTOR | SUBCUTANEOUS | 11 refills | Status: DC

## 2021-12-11 NOTE — Telephone Encounter (Signed)
New prescriptions for Jardiance and Ozempic sent to pharmacy of record today.  Thanks.

## 2021-12-12 NOTE — Telephone Encounter (Signed)
Called patient and left message informing patient that his medications was sent to his requested pharmacy

## 2021-12-22 ENCOUNTER — Telehealth: Payer: Self-pay | Admitting: Emergency Medicine

## 2021-12-22 NOTE — Telephone Encounter (Signed)
Patient dropped off form to be filled out and I placed in providers folder up front. He said he had talked to someone here about it but couldn't remember the name. He asked for a call back when done at [phone].

## 2021-12-25 ENCOUNTER — Encounter: Payer: Self-pay | Admitting: Emergency Medicine

## 2021-12-25 NOTE — Telephone Encounter (Signed)
Paperwork received. In provider box awaiting signature. Will contact patient when completed.

## 2021-12-26 NOTE — Telephone Encounter (Signed)
Called patient that his form is ready for pick up. Form will be at the front desk

## 2022-01-03 ENCOUNTER — Ambulatory Visit (INDEPENDENT_AMBULATORY_CARE_PROVIDER_SITE_OTHER): Payer: HMO | Admitting: Emergency Medicine

## 2022-01-03 ENCOUNTER — Encounter: Payer: Self-pay | Admitting: Emergency Medicine

## 2022-01-03 VITALS — BP 128/74 | HR 69 | Temp 97.7°F | Ht 68.5 in | Wt 217.5 lb

## 2022-01-03 DIAGNOSIS — E1159 Type 2 diabetes mellitus with other circulatory complications: Secondary | ICD-10-CM | POA: Diagnosis not present

## 2022-01-03 DIAGNOSIS — E1169 Type 2 diabetes mellitus with other specified complication: Secondary | ICD-10-CM | POA: Diagnosis not present

## 2022-01-03 DIAGNOSIS — E785 Hyperlipidemia, unspecified: Secondary | ICD-10-CM

## 2022-01-03 DIAGNOSIS — I152 Hypertension secondary to endocrine disorders: Secondary | ICD-10-CM | POA: Diagnosis not present

## 2022-01-03 NOTE — Patient Instructions (Signed)

## 2022-01-03 NOTE — Progress Notes (Signed)
Larry Dawson 66 y.o.   Chief Complaint  Patient presents with   Follow-up    F/u DM, Patient has questions pertaining to the medication he is currently taking and how it will affect his kidney function    HISTORY OF PRESENT ILLNESS: This is a 66 y.o. male A1A here for follow-up of diabetes and hypertension. Started weekly Ozempic 0.5 mg and daily Jardiance 10 mg. Blood sugars numbers at home close to normal. Eating better and losing weight.  Overall feels better. Also taking metformin and rosuvastatin for high cholesterol Has been on Tenoretic for hypertension for a while. Has no complaints or any other medical concerns today.  HPI   Prior to Admission medications   Medication Sig Start Date End Date Taking? Authorizing Provider  Ascorbic Acid (VITAMIN C PO) Take by mouth.   Yes [provider]  aspirin 81 MG tablet Take 81 mg by mouth daily.   Yes [provider]  atenolol-chlorthalidone (TENORETIC) 50-25 MG tablet TAKE 1 TABLET BY MOUTH DAILY 11/28/21 02/26/22 Yes Tinia Oravec, Ines Bloomer, MD  cetirizine (ZYRTEC) 10 MG tablet Take 1 tablet (10 mg total) by mouth daily. 05/10/17  Yes Augusto Gamble B, NP  cholecalciferol (VITAMIN D) 1000 units tablet Take 1,000 Units by mouth daily.   Yes [provider]  cyclobenzaprine (FLEXERIL) 10 MG tablet Take 1 tablet (10 mg total) by mouth 3 (three) times daily as needed for muscle spasms. 12/04/21  Yes Domenic Moras, PA-C  diclofenac (VOLTAREN) 75 MG EC tablet Take 1 tablet (75 mg total) by mouth 2 (two) times daily. Take only as needed for pain. 05/19/21  Yes Thia Olesen, Ines Bloomer, MD  empagliflozin (JARDIANCE) 10 MG TABS tablet Take 1 tablet (10 mg total) by mouth daily before breakfast. 12/11/21  Yes Araseli Sherry, Ines Bloomer, MD  glipiZIDE (GLUCOTROL) 10 MG tablet Take 1 tablet (10 mg total) by mouth 2 (two) times daily before a meal. 03/16/21  Yes Shetara Launer, Ines Bloomer, MD  ibuprofen (ADVIL) 600 MG tablet Take 1 tablet  (600 mg total) by mouth every 6 (six) hours as needed. 12/04/21  Yes Domenic Moras, PA-C  metFORMIN (GLUCOPHAGE) 1000 MG tablet TAKE 1 TABLET(1000 MG) BY MOUTH TWICE DAILY WITH A MEAL 03/10/21  Yes Otilia Kareem, Ines Bloomer, MD  Multiple Vitamins-Minerals (ZINC PO) Take by mouth.   Yes [provider]  Omega-3 Fatty Acids (FISH OIL) 1000 MG CAPS Take by mouth.   Yes [provider]  omeprazole (PRILOSEC) 20 MG capsule Take 20 mg by mouth daily.   Yes [provider]  rosuvastatin (CRESTOR) 20 MG tablet TAKE 1 TABLET(20 MG) BY MOUTH DAILY 07/11/21  Yes Lorea Kupfer, Ines Bloomer, MD  Semaglutide,0.25 or 0.5MG /DOS, (OZEMPIC, 0.25 OR 0.5 MG/DOSE,) 2 MG/3ML SOPN Inject 0.5 mg into the skin once a week. 12/11/21  Yes Lekeya Rollings, Ines Bloomer, MD  sildenafil (VIAGRA) 100 MG tablet Take 0.5-1 tablets (50-100 mg total) by mouth daily as needed for erectile dysfunction. 10/04/20  Yes Lethia Donlon, Ines Bloomer, MD  Turmeric POWD by Does not apply route daily.   Yes [provider]    Allergies  Allergen Reactions   Ace Inhibitors Swelling    Suspected angioedema   Penicillins Hives, Rash and Other (See Comments)    Throat swells   Latex Hives, Swelling and Rash    Patient Active Problem List   Diagnosis Date Noted   Lumbar degenerative disc disease 03/16/2021   Lumbar pain 03/16/2021   General weakness 10/04/2020   Erectile  dysfunction 10/04/2020   Dyslipidemia associated with type 2 diabetes mellitus ([place]) 08/06/2018   Dyslipidemia 08/06/2018   Hypertension associated with type 2 diabetes mellitus ([place]) 07/18/2011    Past Medical History:  Diagnosis Date   Allergy    Anal fissure    COPD (chronic obstructive pulmonary disease) (HCC)    Albuterol PRN.   Hydrocele    Hyperlipidemia    Hypertension    IBS (irritable bowel syndrome) 05/22/13   per chart note-pt denied    Past Surgical History:  Procedure Laterality Date   HERNIA REPAIR  prior to 2013   HYDROCELE EXCISION  Bilateral 11/23/2013   Procedure: HYDROCELECTOMY ADULT;  Surgeon: Bernestine Amass, MD;  Location: [facility];  Service: Urology;  Laterality: Bilateral;   VASECTOMY  prior to 2013    Social History   Socioeconomic History   Marital status: Married    Spouse name: Not on file   Number of children: Not on file   Years of education: Not on file   Highest education level: Not on file  Occupational History   Not on file  Tobacco Use   Smoking status: Former    Types: Cigarettes   Smokeless tobacco: Never  Vaping Use   Vaping Use: Never used  Substance and Sexual Activity   Alcohol use: Yes    Comment: rarely   Drug use: No   Sexual activity: Not on file  Other Topics Concern   Not on file  Social History Narrative   Marital status: married      Employment: [employer]; drives all over.  Owns company.      Tobacco:  Quit smoking in 2005; smoked 25 years      Alcohol:  Rare beer      Drugs:  None      Exercise:  None   Daily caffeine    Social Determinants of Health   Financial Resource Strain: Not on file  Food Insecurity: Not on file  Transportation Needs: Not on file  Physical Activity: Not on file  Stress: Not on file  Social Connections: Not on file  Intimate Partner Violence: Not on file    Family History  Problem Relation Age of Onset   Diabetes Father    Hypertension Father    Diabetes Brother    Hypertension Brother    Diabetes Brother    Diabetes Brother      Review of Systems  Constitutional: Negative.  Negative for chills and fever.  HENT: Negative.  Negative for congestion and sore throat.   Respiratory: Negative.  Negative for cough and shortness of breath.   Cardiovascular: Negative.  Negative for chest pain and palpitations.  Gastrointestinal:  Positive for nausea (Occasional nausea but not daily). Negative for abdominal pain, diarrhea and vomiting.  Genitourinary: Negative.   Musculoskeletal: Negative.   Skin: Negative.   Negative for rash.  Neurological: Negative.  Negative for dizziness and headaches.  All other systems reviewed and are negative.  Today's Vitals   01/03/22 0817  BP: 128/74  Pulse: 69  Temp: 97.7 F (36.5 C)  TempSrc: Oral  SpO2: 96%  Weight: 217 lb 8 oz (98.7 kg)  Height: 5' 8.5" (1.74 m)   Body mass index is 32.59 kg/m.   Physical Exam Vitals reviewed.  Constitutional:      Appearance: Normal appearance.  HENT:     Head: Normocephalic.     Mouth/Throat:     Mouth: Mucous membranes are moist.  Pharynx: Oropharynx is clear.  Eyes:     Extraocular Movements: Extraocular movements intact.     Pupils: Pupils are equal, round, and reactive to light.  Cardiovascular:     Rate and Rhythm: Normal rate and regular rhythm.     Pulses: Normal pulses.     Heart sounds: Normal heart sounds.  Pulmonary:     Effort: Pulmonary effort is normal.     Breath sounds: Normal breath sounds.  Abdominal:     Palpations: Abdomen is soft.     Tenderness: There is no abdominal tenderness.  Musculoskeletal:        General: Normal range of motion.     Cervical back: No tenderness.  Lymphadenopathy:     Cervical: No cervical adenopathy.  Skin:    General: Skin is warm and dry.     Capillary Refill: Capillary refill takes less than 2 seconds.  Neurological:     General: No focal deficit present.     Mental Status: He is alert and oriented to person, place, and time.  Psychiatric:        Mood and Affect: Mood normal.        Behavior: Behavior normal.      ASSESSMENT & PLAN: A total of 44 minutes was spent with the patient and counseling/coordination of care regarding preparing for this visit, review of most recent office visit notes, review of most recent blood work results, review of multiple chronic medical conditions and their management, review of all medications, cardiovascular risks associated with hypertension and diabetes, education on nutrition, prognosis, documentation, and  need for follow-up.  Problem List Items Addressed This Visit       Cardiovascular and Mediastinum   Hypertension associated with type 2 diabetes mellitus ([place]) - Primary    Well-controlled hypertension. Continue Tenoretic 50-25 Well-controlled diabetes. Almost normal blood sugar numbers at home Tolerating Ozempic well Continue Jardiance 10 mg daily and metformin 1000 mg twice a day Continue weekly Ozempic 0.5 mg Patient not sure he is taking glipizide or not. Cardiovascular risks associated with hypertension and diabetes discussed Diet and nutrition discussed Follow-up in 3 months        Endocrine   Dyslipidemia associated with type 2 diabetes mellitus ([place])    Stable.  Diet and nutrition discussed. Continue rosuvastatin 20 mg daily      Patient Instructions  Diabetes Mellitus and Nutrition, Adult When you have diabetes, or diabetes mellitus, it is very important to have healthy eating habits because your blood sugar (glucose) levels are greatly affected by what you eat and drink. Eating healthy foods in the right amounts, at about the same times every day, can help you: Manage your blood glucose. Lower your risk of heart disease. Improve your blood pressure. Reach or maintain a healthy weight. What can affect my meal plan? Every person with diabetes is different, and each person has different needs for a meal plan. Your health care provider may recommend that you work with a dietitian to make a meal plan that is best for you. Your meal plan may vary depending on factors such as: The calories you need. The medicines you take. Your weight. Your blood glucose, blood pressure, and cholesterol levels. Your activity level. Other health conditions you have, such as heart or kidney disease. How do carbohydrates affect me? Carbohydrates, also called carbs, affect your blood glucose level more than any other type of food. Eating carbs raises the amount of glucose in your blood. It  is important  to know how many carbs you can safely have in each meal. This is different for every person. Your dietitian can help you calculate how many carbs you should have at each meal and for each snack. How does alcohol affect me? Alcohol can cause a decrease in blood glucose (hypoglycemia), especially if you use insulin or take certain diabetes medicines by mouth. Hypoglycemia can be a life-threatening condition. Symptoms of hypoglycemia, such as sleepiness, dizziness, and confusion, are similar to symptoms of having too much alcohol. Do not drink alcohol if: Your health care provider tells you not to drink. You are pregnant, may be pregnant, or are planning to become pregnant. If you drink alcohol: Limit how much you have to: 0-1 drink a day for women. 0-2 drinks a day for men. Know how much alcohol is in your drink. In the [place] one drink equals one 12 oz bottle of beer (355 mL), one 5 oz glass of wine (148 mL), or one 1 oz glass of hard liquor (44 mL). Keep yourself hydrated with water, diet soda, or unsweetened iced tea. Keep in mind that regular soda, juice, and other mixers may contain a lot of sugar and must be counted as carbs. What are tips for following this plan?  Reading food labels Start by checking the serving size on the Nutrition Facts label of packaged foods and drinks. The number of calories and the amount of carbs, fats, and other nutrients listed on the label are based on one serving of the item. Many items contain more than one serving per package. Check the total grams (g) of carbs in one serving. Check the number of grams of saturated fats and trans fats in one serving. Choose foods that have a low amount or none of these fats. Check the number of milligrams (mg) of salt (sodium) in one serving. Most people should limit total sodium intake to less than 2,300 mg per day. Always check the nutrition information of foods labeled as "low-fat" or "nonfat." These foods may  be higher in added sugar or refined carbs and should be avoided. Talk to your dietitian to identify your daily goals for nutrients listed on the label. Shopping Avoid buying canned, pre-made, or processed foods. These foods tend to be high in fat, sodium, and added sugar. Shop around the outside edge of the grocery store. This is where you will most often find fresh fruits and vegetables, bulk grains, fresh meats, and fresh dairy products. Cooking Use low-heat cooking methods, such as baking, instead of high-heat cooking methods, such as deep frying. Cook using healthy oils, such as olive, canola, or sunflower oil. Avoid cooking with butter, cream, or high-fat meats. Meal planning Eat meals and snacks regularly, preferably at the same times every day. Avoid going long periods of time without eating. Eat foods that are high in fiber, such as fresh fruits, vegetables, beans, and whole grains. Eat 4-6 oz (112-168 g) of lean protein each day, such as lean meat, chicken, fish, eggs, or tofu. One ounce (oz) (28 g) of lean protein is equal to: 1 oz (28 g) of meat, chicken, or fish. 1 egg.  cup (62 g) of tofu. Eat some foods each day that contain healthy fats, such as avocado, nuts, seeds, and fish. What foods should I eat? Fruits Berries. Apples. Oranges. Peaches. Apricots. Plums. Grapes. Mangoes. Papayas. Pomegranates. Kiwi. Cherries. Vegetables Leafy greens, including lettuce, spinach, kale, chard, collard greens, mustard greens, and cabbage. Beets. Cauliflower. Broccoli. Carrots. Green beans. Tomatoes. Peppers.  Onions. Cucumbers. Brussels sprouts. Grains Whole grains, such as whole-wheat or whole-grain bread, crackers, tortillas, cereal, and pasta. Unsweetened oatmeal. Quinoa. Brown or wild rice. Meats and other proteins Seafood. Poultry without skin. Lean cuts of poultry and beef. Tofu. Nuts. Seeds. Dairy Low-fat or fat-free dairy products such as milk, yogurt, and cheese. The items listed  above may not be a complete list of foods and beverages you can eat and drink. Contact a dietitian for more information. What foods should I avoid? Fruits Fruits canned with syrup. Vegetables Canned vegetables. Frozen vegetables with butter or cream sauce. Grains Refined white flour and flour products such as bread, pasta, snack foods, and cereals. Avoid all processed foods. Meats and other proteins Fatty cuts of meat. Poultry with skin. Breaded or fried meats. Processed meat. Avoid saturated fats. Dairy Full-fat yogurt, cheese, or milk. Beverages Sweetened drinks, such as soda or iced tea. The items listed above may not be a complete list of foods and beverages you should avoid. Contact a dietitian for more information. Questions to ask a health care provider Do I need to meet with a certified diabetes care and education specialist? Do I need to meet with a dietitian? What number can I call if I have questions? When are the best times to check my blood glucose? Where to find more information: American Diabetes Association: diabetes.org Academy of Nutrition and Dietetics: eatright.[employer] of Diabetes and Digestive and Kidney Diseases: [employer] Association of Diabetes Care & Education Specialists: diabeteseducator.org Summary It is important to have healthy eating habits because your blood sugar (glucose) levels are greatly affected by what you eat and drink. It is important to use alcohol carefully. A healthy meal plan will help you manage your blood glucose and lower your risk of heart disease. Your health care provider may recommend that you work with a dietitian to make a meal plan that is best for you. This information is not intended to replace advice given to you by your health care provider. Make sure you discuss any questions you have with your health care provider. Document Revised: 10/28/2019 Document Reviewed: 10/28/2019 Elsevier Patient Education  [place], MD [facility]

## 2022-01-03 NOTE — Assessment & Plan Note (Signed)
Well-controlled hypertension. Continue Tenoretic 50-25 Well-controlled diabetes. Almost normal blood sugar numbers at home Tolerating Ozempic well Continue Jardiance 10 mg daily and metformin 1000 mg twice a day Continue weekly Ozempic 0.5 mg Patient not sure he is taking glipizide or not. Cardiovascular risks associated with hypertension and diabetes discussed Diet and nutrition discussed Follow-up in 3 months

## 2022-01-03 NOTE — Assessment & Plan Note (Signed)
Stable.  Diet and nutrition discussed.  Continue rosuvastatin 20 mg daily.  

## 2022-01-03 NOTE — Addendum Note (Signed)
Addended by: Rae Mar on: 01/03/2022 09:35 AM   Modules accepted: Orders

## 2022-01-08 ENCOUNTER — Ambulatory Visit (INDEPENDENT_AMBULATORY_CARE_PROVIDER_SITE_OTHER): Payer: HMO

## 2022-01-08 DIAGNOSIS — Z23 Encounter for immunization: Secondary | ICD-10-CM

## 2022-01-15 ENCOUNTER — Telehealth: Payer: Self-pay

## 2022-01-15 NOTE — Telephone Encounter (Signed)
ERROR

## 2022-02-07 ENCOUNTER — Telehealth: Payer: Self-pay | Admitting: Emergency Medicine

## 2022-02-07 NOTE — Telephone Encounter (Signed)
He has to find out from his insurance what is an alternative and let us know.  Trulicity is an option.

## 2022-02-07 NOTE — Telephone Encounter (Signed)
Called patient and left message for patient to call office in reference to medication question

## 2022-02-07 NOTE — Telephone Encounter (Signed)
Patient called and said that his pharmacy is not able to get Semaglutide,0.25 or 0.5MG /DOS, (OZEMPIC, 0.25 OR 0.5 MG/DOSE,) 2 MG/3ML SOPN in, It seems to be on back order. What does patient need to do. Patient is out of medication and he is due tomorrow for injection. Call back is [phone]

## 2022-02-08 ENCOUNTER — Other Ambulatory Visit: Payer: Self-pay | Admitting: Emergency Medicine

## 2022-02-08 MED ORDER — TRULICITY 0.75 MG/0.5ML ~~LOC~~ SOAJ: 0.7500 mg | SUBCUTANEOUS | 3 refills | Status: DC

## 2022-02-08 NOTE — Telephone Encounter (Signed)
Pt request send RX:  Larry Dawson [facility] [address]

## 2022-02-08 NOTE — Telephone Encounter (Signed)
Called and left message for patient to inform him that his new rx was sent to his pharmacy

## 2022-02-08 NOTE — Telephone Encounter (Signed)
New prescription for Trulicity 8.37 mg weekly sent to pharmacy of record.  Thanks.

## 2022-02-26 ENCOUNTER — Other Ambulatory Visit: Payer: Self-pay | Admitting: Emergency Medicine

## 2022-02-26 DIAGNOSIS — I10 Essential (primary) hypertension: Secondary | ICD-10-CM

## 2022-02-28 ENCOUNTER — Other Ambulatory Visit: Payer: Self-pay | Admitting: *Deleted

## 2022-02-28 DIAGNOSIS — I10 Essential (primary) hypertension: Secondary | ICD-10-CM

## 2022-02-28 MED ORDER — ATENOLOL-CHLORTHALIDONE 50-25 MG PO TABS: 1.0000 | ORAL_TABLET | Freq: Every day | ORAL | 2 refills | Status: DC

## 2022-03-27 ENCOUNTER — Encounter: Payer: Self-pay | Admitting: Emergency Medicine

## 2022-03-27 ENCOUNTER — Ambulatory Visit (INDEPENDENT_AMBULATORY_CARE_PROVIDER_SITE_OTHER): Payer: Medicare Other | Admitting: Emergency Medicine

## 2022-03-27 VITALS — BP 136/84 | HR 71 | Temp 97.6°F | Ht 68.5 in | Wt 213.2 lb

## 2022-03-27 DIAGNOSIS — E785 Hyperlipidemia, unspecified: Secondary | ICD-10-CM | POA: Diagnosis not present

## 2022-03-27 DIAGNOSIS — I152 Hypertension secondary to endocrine disorders: Secondary | ICD-10-CM

## 2022-03-27 DIAGNOSIS — E1169 Type 2 diabetes mellitus with other specified complication: Secondary | ICD-10-CM | POA: Diagnosis not present

## 2022-03-27 DIAGNOSIS — E1159 Type 2 diabetes mellitus with other circulatory complications: Secondary | ICD-10-CM

## 2022-03-27 LAB — POCT GLYCOSYLATED HEMOGLOBIN (HGB A1C): Hemoglobin A1C: 6.9 % — AB (ref 4.0–5.6)

## 2022-03-27 NOTE — Progress Notes (Signed)
Larry Dawson 66 y.o.   Chief Complaint  Patient presents with   Follow-up    F/u appt DM, patient has Blood glucose reading questions., patient has questions about diabetes medications    HISTORY OF PRESENT ILLNESS: This is a 66 y.o. male here for follow-up of diabetes Doing much better.  Presently taking Ozempic, metformin and Jardiance.  At some point pharmacy ran out of Ozempic and dispensed Trulicity instead which he uses for a couple weeks.  Back on Ozempic now. Eating better and losing weight. Normal blood pressure readings at home. No other complaints or medical concerns today.  HPI   Prior to Admission medications   Medication Sig Start Date End Date Taking? Authorizing Provider  Ascorbic Acid (VITAMIN C PO) Take by mouth.   Yes [provider]  aspirin 81 MG tablet Take 81 mg by mouth daily.   Yes [provider]  atenolol-chlorthalidone (TENORETIC) 50-25 MG tablet Take 1 tablet by mouth daily. 02/28/22 05/29/22 Yes Felisha Claytor, Eilleen Kempf, MD  cetirizine (ZYRTEC) 10 MG tablet Take 1 tablet (10 mg total) by mouth daily. 05/10/17  Yes Linus Mako B, NP  cholecalciferol (VITAMIN D) 1000 units tablet Take 1,000 Units by mouth daily.   Yes [provider]  cyclobenzaprine (FLEXERIL) 10 MG tablet Take 1 tablet (10 mg total) by mouth 3 (three) times daily as needed for muscle spasms. 12/04/21  Yes Fayrene Helper, PA-C  diclofenac (VOLTAREN) 75 MG EC tablet Take 1 tablet (75 mg total) by mouth 2 (two) times daily. Take only as needed for pain. 05/19/21  Yes Masiyah Jorstad, Eilleen Kempf, MD  Dulaglutide (TRULICITY) 0.75 MG/0.5ML SOPN Inject 0.75 mg into the skin once a week. 02/08/22  Yes Emaad Nanna, Eilleen Kempf, MD  empagliflozin (JARDIANCE) 10 MG TABS tablet Take 1 tablet (10 mg total) by mouth daily before breakfast. 12/11/21  Yes Seferina Brokaw, Eilleen Kempf, MD  ibuprofen (ADVIL) 600 MG tablet Take 1 tablet (600 mg total) by mouth every 6 (six) hours as needed. 12/04/21  Yes  Fayrene Helper, PA-C  metFORMIN (GLUCOPHAGE) 1000 MG tablet TAKE 1 TABLET(1000 MG) BY MOUTH TWICE DAILY WITH A MEAL 03/10/21  Yes Jerilyn Gillaspie, Eilleen Kempf, MD  Multiple Vitamins-Minerals (ZINC PO) Take by mouth.   Yes [provider]  Omega-3 Fatty Acids (FISH OIL) 1000 MG CAPS Take by mouth.   Yes [provider]  omeprazole (PRILOSEC) 20 MG capsule Take 20 mg by mouth daily.   Yes [provider]  OZEMPIC, 0.25 OR 0.5 MG/DOSE, 2 MG/3ML SOPN Inject into the skin. 02/12/22  Yes [provider]  rosuvastatin (CRESTOR) 20 MG tablet TAKE 1 TABLET(20 MG) BY MOUTH DAILY 07/11/21  Yes Cohl Behrens, Eilleen Kempf, MD  sildenafil (VIAGRA) 100 MG tablet Take 0.5-1 tablets (50-100 mg total) by mouth daily as needed for erectile dysfunction. 10/04/20  Yes Layia Walla, Eilleen Kempf, MD  Turmeric POWD by Does not apply route daily.   Yes [provider]    Allergies  Allergen Reactions   Ace Inhibitors Swelling    Suspected angioedema   Penicillins Hives, Rash and Other (See Comments)    Throat swells   Latex Hives, Swelling and Rash    Patient Active Problem List   Diagnosis Date Noted   Lumbar degenerative disc disease 03/16/2021   Lumbar pain 03/16/2021   General weakness 10/04/2020   Erectile dysfunction 10/04/2020   Dyslipidemia associated with type 2 diabetes mellitus (HCC) 08/06/2018   Dyslipidemia 08/06/2018   Hypertension associated with type 2  diabetes mellitus (HCC) 07/18/2011    Past Medical History:  Diagnosis Date   Allergy    Anal fissure    COPD (chronic obstructive pulmonary disease) (HCC)    Albuterol PRN.   Hydrocele    Hyperlipidemia    Hypertension    IBS (irritable bowel syndrome) 05/22/13   per chart note-pt denied    Past Surgical History:  Procedure Laterality Date   HERNIA REPAIR  prior to 2013   HYDROCELE EXCISION Bilateral 11/23/2013   Procedure: HYDROCELECTOMY ADULT;  Surgeon: Valetta Fuller, MD;  Location: [facility];  Service: Urology;  Laterality: Bilateral;   VASECTOMY  prior to 2013    Social History   Socioeconomic History   Marital status: Married    Spouse name: Not on file   Number of children: Not on file   Years of education: Not on file   Highest education level: Not on file  Occupational History   Not on file  Tobacco Use   Smoking status: Former    Types: Cigarettes   Smokeless tobacco: Never  Vaping Use   Vaping Use: Never used  Substance and Sexual Activity   Alcohol use: Yes    Comment: rarely   Drug use: No   Sexual activity: Not on file  Other Topics Concern   Not on file  Social History Narrative   Marital status: married      Employment: [employer]; drives all over.  Owns company.      Tobacco:  Quit smoking in 2005; smoked 25 years      Alcohol:  Rare beer      Drugs:  None      Exercise:  None   Daily caffeine    Social Determinants of Health   Financial Resource Strain: Not on file  Food Insecurity: Not on file  Transportation Needs: Not on file  Physical Activity: Not on file  Stress: Not on file  Social Connections: Not on file  Intimate Partner Violence: Not on file    Family History  Problem Relation Age of Onset   Diabetes Father    Hypertension Father    Diabetes Brother    Hypertension Brother    Diabetes Brother    Diabetes Brother      Review of Systems  Constitutional: Negative.  Negative for chills and fever.  HENT: Negative.  Negative for congestion and sore throat.   Respiratory: Negative.  Negative for cough and shortness of breath.   Cardiovascular: Negative.  Negative for chest pain and palpitations.  Gastrointestinal: Negative.  Negative for abdominal pain, diarrhea, nausea and vomiting.  Genitourinary: Negative.  Negative for dysuria and hematuria.  Skin: Negative.  Negative for rash.  Neurological: Negative.  Negative for dizziness and headaches.  All other systems reviewed and are negative.   Today's  Vitals   03/27/22 1015  BP: 136/84  Pulse: 71  Temp: 97.6 F (36.4 C)  TempSrc: Oral  SpO2: 96%  Weight: 213 lb 4 oz (96.7 kg)  Height: 5' 8.5" (1.74 m)   Body mass index is 31.95 kg/m. Wt Readings from Last 3 Encounters:  03/27/22 213 lb 4 oz (96.7 kg)  01/03/22 217 lb 8 oz (98.7 kg)  12/06/21 226 lb 6 oz (102.7 kg)    Physical Exam Vitals reviewed.  Constitutional:      Appearance: Normal appearance.  HENT:     Head: Normocephalic.     Mouth/Throat:     Mouth: Mucous membranes  are moist.     Pharynx: Oropharynx is clear.  Eyes:     Extraocular Movements: Extraocular movements intact.     Pupils: Pupils are equal, round, and reactive to light.  Cardiovascular:     Rate and Rhythm: Normal rate and regular rhythm.     Pulses: Normal pulses.     Heart sounds: Normal heart sounds.  Pulmonary:     Effort: Pulmonary effort is normal.     Breath sounds: Normal breath sounds.  Skin:    General: Skin is warm and dry.  Neurological:     General: No focal deficit present.     Mental Status: He is alert and oriented to person, place, and time.  Psychiatric:        Mood and Affect: Mood normal.        Behavior: Behavior normal.    Results for orders placed or performed in visit on 03/27/22 (from the past 24 hour(s))  POCT HgB A1C     Status: Abnormal   Collection Time: 03/27/22 11:08 AM  Result Value Ref Range   Hemoglobin A1C 6.9 (A) 4.0 - 5.6 %   HbA1c POC (<> result, manual entry)     HbA1c, POC (prediabetic range)     HbA1c, POC (controlled diabetic range)       ASSESSMENT & PLAN: A total of 47 minutes was spent with the patient and counseling/coordination of care regarding preparing for this visit, review of most recent office visit notes, review of most recent blood work results including interpretation of today's hemoglobin A1c, review of chronic medical conditions under management, review of all medications, cardiovascular risks associated with hypertension and  diabetes, education on nutrition, prognosis, documentation and need for follow-up.  Problem List Items Addressed This Visit       Cardiovascular and Mediastinum   Hypertension associated with type 2 diabetes mellitus (HCC) - Primary    Well-controlled hypertension with normal blood pressure readings at home. Continue Tenoretic 50-25 mg daily. Well-controlled diabetes with hemoglobin A1c at 6.9 Continue Ozempic 0.5 mg weekly, metformin 1000 mg twice a day, and Jardiance 10 mg daily. Cardiovascular risk associated with hypertension and diabetes discussed. Diet and nutrition discussed. Follow-up in 3 months.      Relevant Medications   OZEMPIC, 0.25 OR 0.5 MG/DOSE, 2 MG/3ML SOPN   Other Relevant Orders   POCT HgB A1C (Completed)     Endocrine   Dyslipidemia associated with type 2 diabetes mellitus (HCC)    Diet and nutrition discussed. Continue rosuvastatin 20 mg daily. The 10-year ASCVD risk score (Arnett DK, et al., 2019) is: 30.6%   Values used to calculate the score:     Age: [age] years     Sex: Male     Is Non-Hispanic African American: Yes     Diabetic: Yes     Tobacco smoker: No     Systolic Blood Pressure: 136 mmHg     Is BP treated: Yes     HDL Cholesterol: 36.2 mg/dL     Total Cholesterol: 132 mg/dL       Relevant Medications   OZEMPIC, 0.25 OR 0.5 MG/DOSE, 2 MG/3ML SOPN   Patient Instructions  Diabetes Mellitus and Nutrition, Adult When you have diabetes, or diabetes mellitus, it is very important to have healthy eating habits because your blood sugar (glucose) levels are greatly affected by what you eat and drink. Eating healthy foods in the right amounts, at about the same times every day, can help you:  Manage your blood glucose. Lower your risk of heart disease. Improve your blood pressure. Reach or maintain a healthy weight. What can affect my meal plan? Every person with diabetes is different, and each person has different needs for a meal plan. Your  health care provider may recommend that you work with a dietitian to make a meal plan that is best for you. Your meal plan may vary depending on factors such as: The calories you need. The medicines you take. Your weight. Your blood glucose, blood pressure, and cholesterol levels. Your activity level. Other health conditions you have, such as heart or kidney disease. How do carbohydrates affect me? Carbohydrates, also called carbs, affect your blood glucose level more than any other type of food. Eating carbs raises the amount of glucose in your blood. It is important to know how many carbs you can safely have in each meal. This is different for every person. Your dietitian can help you calculate how many carbs you should have at each meal and for each snack. How does alcohol affect me? Alcohol can cause a decrease in blood glucose (hypoglycemia), especially if you use insulin or take certain diabetes medicines by mouth. Hypoglycemia can be a life-threatening condition. Symptoms of hypoglycemia, such as sleepiness, dizziness, and confusion, are similar to symptoms of having too much alcohol. Do not drink alcohol if: Your health care provider tells you not to drink. You are pregnant, may be pregnant, or are planning to become pregnant. If you drink alcohol: Limit how much you have to: 0-1 drink a day for women. 0-2 drinks a day for men. Know how much alcohol is in your drink. In the [place] one drink equals one 12 oz bottle of beer (355 mL), one 5 oz glass of wine (148 mL), or one 1 oz glass of hard liquor (44 mL). Keep yourself hydrated with water, diet soda, or unsweetened iced tea. Keep in mind that regular soda, juice, and other mixers may contain a lot of sugar and must be counted as carbs. What are tips for following this plan?  Reading food labels Start by checking the serving size on the Nutrition Facts label of packaged foods and drinks. The number of calories and the amount of carbs,  fats, and other nutrients listed on the label are based on one serving of the item. Many items contain more than one serving per package. Check the total grams (g) of carbs in one serving. Check the number of grams of saturated fats and trans fats in one serving. Choose foods that have a low amount or none of these fats. Check the number of milligrams (mg) of salt (sodium) in one serving. Most people should limit total sodium intake to less than 2,300 mg per day. Always check the nutrition information of foods labeled as "low-fat" or "nonfat." These foods may be higher in added sugar or refined carbs and should be avoided. Talk to your dietitian to identify your daily goals for nutrients listed on the label. Shopping Avoid buying canned, pre-made, or processed foods. These foods tend to be high in fat, sodium, and added sugar. Shop around the outside edge of the grocery store. This is where you will most often find fresh fruits and vegetables, bulk grains, fresh meats, and fresh dairy products. Cooking Use low-heat cooking methods, such as baking, instead of high-heat cooking methods, such as deep frying. Cook using healthy oils, such as olive, canola, or sunflower oil. Avoid cooking with butter, cream, or high-fat meats.  Meal planning Eat meals and snacks regularly, preferably at the same times every day. Avoid going long periods of time without eating. Eat foods that are high in fiber, such as fresh fruits, vegetables, beans, and whole grains. Eat 4-6 oz (112-168 g) of lean protein each day, such as lean meat, chicken, fish, eggs, or tofu. One ounce (oz) (28 g) of lean protein is equal to: 1 oz (28 g) of meat, chicken, or fish. 1 egg.  cup (62 g) of tofu. Eat some foods each day that contain healthy fats, such as avocado, nuts, seeds, and fish. What foods should I eat? Fruits Berries. Apples. Oranges. Peaches. Apricots. Plums. Grapes. Mangoes. Papayas. Pomegranates. Kiwi.  Cherries. Vegetables Leafy greens, including lettuce, spinach, kale, chard, collard greens, mustard greens, and cabbage. Beets. Cauliflower. Broccoli. Carrots. Green beans. Tomatoes. Peppers. Onions. Cucumbers. Brussels sprouts. Grains Whole grains, such as whole-wheat or whole-grain bread, crackers, tortillas, cereal, and pasta. Unsweetened oatmeal. Quinoa. Brown or wild rice. Meats and other proteins Seafood. Poultry without skin. Lean cuts of poultry and beef. Tofu. Nuts. Seeds. Dairy Low-fat or fat-free dairy products such as milk, yogurt, and cheese. The items listed above may not be a complete list of foods and beverages you can eat and drink. Contact a dietitian for more information. What foods should I avoid? Fruits Fruits canned with syrup. Vegetables Canned vegetables. Frozen vegetables with butter or cream sauce. Grains Refined white flour and flour products such as bread, pasta, snack foods, and cereals. Avoid all processed foods. Meats and other proteins Fatty cuts of meat. Poultry with skin. Breaded or fried meats. Processed meat. Avoid saturated fats. Dairy Full-fat yogurt, cheese, or milk. Beverages Sweetened drinks, such as soda or iced tea. The items listed above may not be a complete list of foods and beverages you should avoid. Contact a dietitian for more information. Questions to ask a health care provider Do I need to meet with a certified diabetes care and education specialist? Do I need to meet with a dietitian? What number can I call if I have questions? When are the best times to check my blood glucose? Where to find more information: American Diabetes Association: diabetes.org Academy of Nutrition and Dietetics: eatright.[employer] of Diabetes and Digestive and Kidney Diseases: StageSync.si Association of Diabetes Care & Education Specialists: diabeteseducator.org Summary It is important to have healthy eating habits because your blood sugar  (glucose) levels are greatly affected by what you eat and drink. It is important to use alcohol carefully. A healthy meal plan will help you manage your blood glucose and lower your risk of heart disease. Your health care provider may recommend that you work with a dietitian to make a meal plan that is best for you. This information is not intended to replace advice given to you by your health care provider. Make sure you discuss any questions you have with your health care provider. Document Revised: 10/28/2019 Document Reviewed: 10/28/2019 Elsevier Patient Education  2023 Elsevier Inc.    Edwina Barth, MD [facility]

## 2022-03-27 NOTE — Assessment & Plan Note (Signed)
Well-controlled hypertension with normal blood pressure readings at home. Continue Tenoretic 50-25 mg daily. Well-controlled diabetes with hemoglobin A1c at 6.9 Continue Ozempic 0.5 mg weekly, metformin 1000 mg twice a day, and Jardiance 10 mg daily. Cardiovascular risk associated with hypertension and diabetes discussed. Diet and nutrition discussed. Follow-up in 3 months.

## 2022-03-27 NOTE — Patient Instructions (Signed)

## 2022-03-27 NOTE — Assessment & Plan Note (Signed)
Diet and nutrition discussed. Continue rosuvastatin 20 mg daily. The 10-year ASCVD risk score (Arnett DK, et al., 2019) is: 30.6%   Values used to calculate the score:     Age: 66 years     Sex: Male     Is Non-Hispanic African American: Yes     Diabetic: Yes     Tobacco smoker: No     Systolic Blood Pressure: 136 mmHg     Is BP treated: Yes     HDL Cholesterol: 36.2 mg/dL     Total Cholesterol: 132 mg/dL

## 2022-04-04 ENCOUNTER — Ambulatory Visit: Payer: HMO | Admitting: Emergency Medicine

## 2022-04-11 ENCOUNTER — Other Ambulatory Visit: Payer: Self-pay | Admitting: Emergency Medicine

## 2022-04-11 DIAGNOSIS — E1165 Type 2 diabetes mellitus with hyperglycemia: Secondary | ICD-10-CM

## 2022-05-27 ENCOUNTER — Other Ambulatory Visit: Payer: Self-pay | Admitting: Emergency Medicine

## 2022-05-27 DIAGNOSIS — I10 Essential (primary) hypertension: Secondary | ICD-10-CM

## 2022-06-26 ENCOUNTER — Ambulatory Visit: Payer: Medicare Other | Admitting: Orthopaedic Surgery

## 2022-06-26 ENCOUNTER — Other Ambulatory Visit: Payer: Self-pay

## 2022-06-26 ENCOUNTER — Ambulatory Visit (INDEPENDENT_AMBULATORY_CARE_PROVIDER_SITE_OTHER): Payer: Medicare Other | Admitting: Emergency Medicine

## 2022-06-26 ENCOUNTER — Encounter: Payer: Self-pay | Admitting: Emergency Medicine

## 2022-06-26 VITALS — BP 128/82 | HR 67 | Temp 98.4°F | Ht 68.0 in | Wt 216.0 lb

## 2022-06-26 DIAGNOSIS — E1169 Type 2 diabetes mellitus with other specified complication: Secondary | ICD-10-CM | POA: Diagnosis not present

## 2022-06-26 DIAGNOSIS — R399 Unspecified symptoms and signs involving the genitourinary system: Secondary | ICD-10-CM

## 2022-06-26 DIAGNOSIS — E1159 Type 2 diabetes mellitus with other circulatory complications: Secondary | ICD-10-CM

## 2022-06-26 DIAGNOSIS — N529 Male erectile dysfunction, unspecified: Secondary | ICD-10-CM | POA: Diagnosis not present

## 2022-06-26 DIAGNOSIS — E785 Hyperlipidemia, unspecified: Secondary | ICD-10-CM

## 2022-06-26 DIAGNOSIS — M5136 Other intervertebral disc degeneration, lumbar region: Secondary | ICD-10-CM | POA: Diagnosis not present

## 2022-06-26 DIAGNOSIS — I152 Hypertension secondary to endocrine disorders: Secondary | ICD-10-CM | POA: Diagnosis not present

## 2022-06-26 DIAGNOSIS — M25551 Pain in right hip: Secondary | ICD-10-CM

## 2022-06-26 DIAGNOSIS — M545 Low back pain, unspecified: Secondary | ICD-10-CM

## 2022-06-26 LAB — COMPREHENSIVE METABOLIC PANEL
ALT: 33 U/L (ref 0–53)
AST: 19 U/L (ref 0–37)
Albumin: 4.1 g/dL (ref 3.5–5.2)
Alkaline Phosphatase: 55 U/L (ref 39–117)
BUN: 16 mg/dL (ref 6–23)
CO2: 31 mEq/L (ref 19–32)
Calcium: 9.9 mg/dL (ref 8.4–10.5)
Chloride: 95 mEq/L — ABNORMAL LOW (ref 96–112)
Creatinine, Ser: 1.14 mg/dL (ref 0.40–1.50)
GFR: 67.42 mL/min (ref >60.00)
Glucose, Bld: 141 mg/dL — ABNORMAL HIGH (ref 70–99)
Potassium: 3.4 mEq/L — ABNORMAL LOW (ref 3.5–5.1)
Sodium: 137 mEq/L (ref 135–145)
Total Bilirubin: 1.1 mg/dL (ref 0.2–1.2)
Total Protein: 7.3 g/dL (ref 6.0–8.3)

## 2022-06-26 LAB — LIPID PANEL
Cholesterol: 120 mg/dL (ref 0–200)
HDL: 54 mg/dL (ref >39.00)
LDL Cholesterol: 56 mg/dL (ref 0–99)
NonHDL: 65.72
Total CHOL/HDL Ratio: 2
Triglycerides: 47 mg/dL (ref 0.0–149.0)
VLDL: 9.4 mg/dL (ref 0.0–40.0)

## 2022-06-26 LAB — POCT GLYCOSYLATED HEMOGLOBIN (HGB A1C): Hemoglobin A1C: 7.4 % — AB (ref 4.0–5.6)

## 2022-06-26 LAB — PSA: PSA: 0.85 ng/mL (ref 0.10–4.00)

## 2022-06-26 MED ORDER — SEMAGLUTIDE (1 MG/DOSE) 4 MG/3ML ~~LOC~~ SOPN: 1.0000 mg | PEN_INJECTOR | SUBCUTANEOUS | 5 refills | Status: DC
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
## 2022-06-26 NOTE — Assessment & Plan Note (Signed)
Well-controlled hypertension. Continue Tenoretic 50-25 mg daily A1c higher than before at 7.4, not at goal. Recommend to increase Ozempic to 1 mg weekly and continue metformin 1000 mg twice a day and Jardiance 10 mg daily Cardiovascular risk associated with uncontrolled diabetes discussed Diet and nutrition discussed Follow-up in 3 months
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
## 2022-06-26 NOTE — Assessment & Plan Note (Signed)
Active and affecting quality of life Not responding to physical therapy Pain management discussed Needs orthopedic evaluation Referral placed today

## 2022-06-26 NOTE — Assessment & Plan Note (Signed)
Cardiovascular risks associated with dyslipidemia discussed Diet and nutrition discussed Continue rosuvastatin 20 mg daily. The 10-year ASCVD risk score (Arnett DK, et al., 2019) is: 27.8%   Values used to calculate the score:     Age: 67 years     Sex: Male     Is Non-Hispanic African American: Yes     Diabetic: Yes     Tobacco smoker: No     Systolic Blood Pressure: 0000000 mmHg     Is BP treated: Yes     HDL Cholesterol: 36.2 mg/dL     Total Cholesterol: 132 mg/dL

## 2022-06-26 NOTE — Patient Instructions (Signed)

## 2022-06-26 NOTE — Assessment & Plan Note (Signed)
And associated with erectile dysfunction No family history of prostate cancer Needs urology evaluation Referral placed today

## 2022-07-26 DIAGNOSIS — E349 Endocrine disorder, unspecified: Secondary | ICD-10-CM | POA: Diagnosis not present

## 2022-07-26 DIAGNOSIS — R3915 Urgency of urination: Secondary | ICD-10-CM | POA: Diagnosis not present

## 2022-07-29 IMAGING — MR MR LUMBAR SPINE W/O CM
5 series · 35 of 48 positions shown · non-contrast
Comparison: None.

CLINICAL DATA: 65-year-old male with low back pain, bilateral leg
pain. No known injury.

EXAM:
MRI LUMBAR SPINE WITHOUT CONTRAST
TECHNIQUE: Multiplanar, multisequence MR imaging of the lumbar spine was
performed. No intravenous contrast was administered.

[Series 2: T1 · sagittal · 4.0mm · 0.41mm/px · 4 of 12 slices shown (1 of 2)]
[im 1/12]
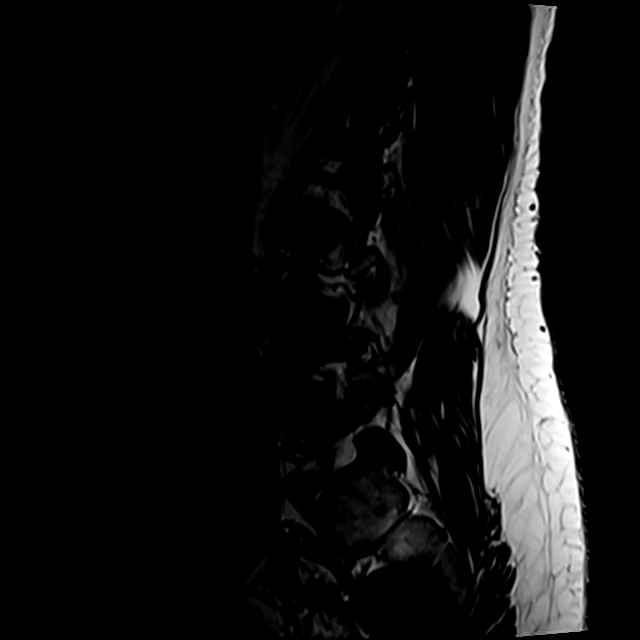
[im 4/12]
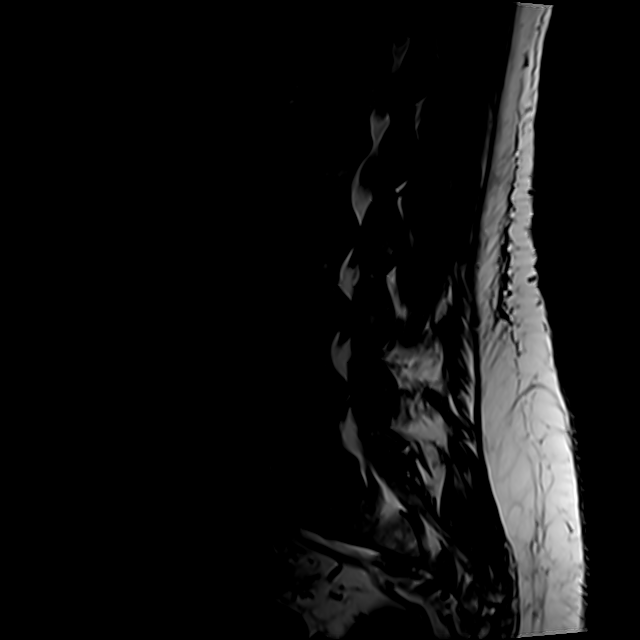
[im 8/12]
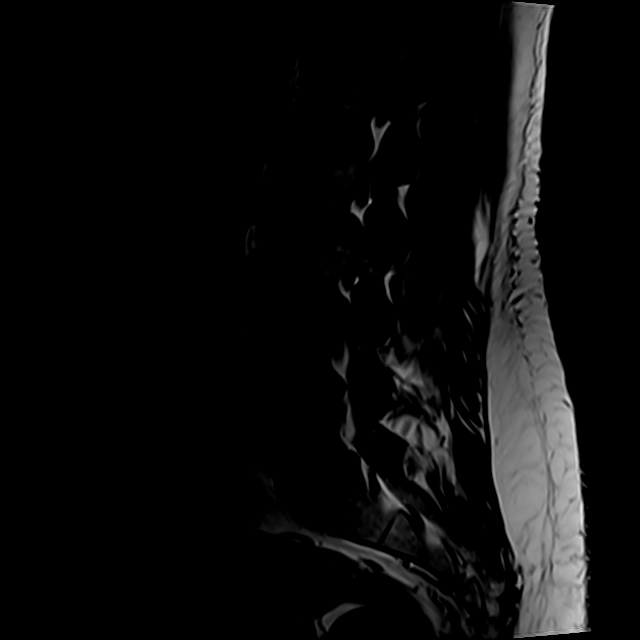
[im 12/12]
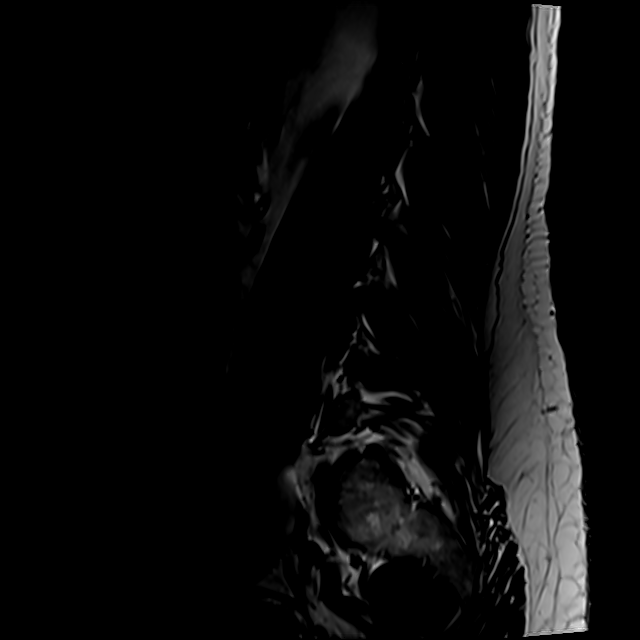

[Series 3: STIR · sagittal · 4.0mm · 0.51mm/px · 4 of 12 slices shown]
[im 1/12]
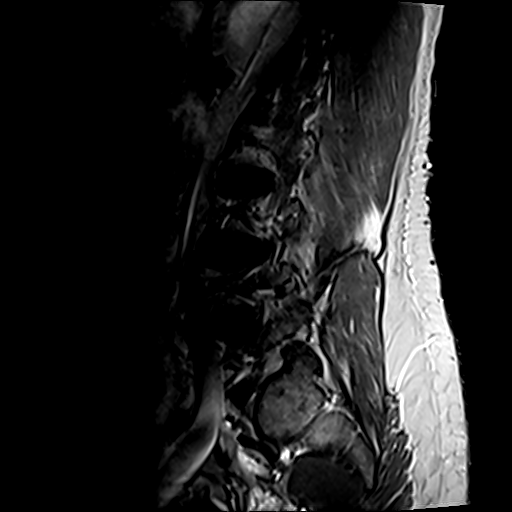
[im 3/12]
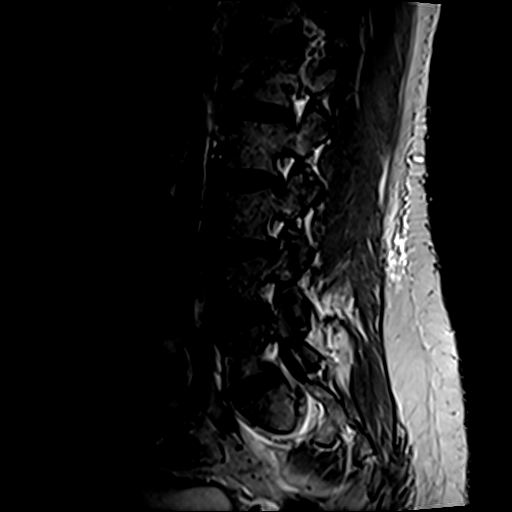
[im 6/12]
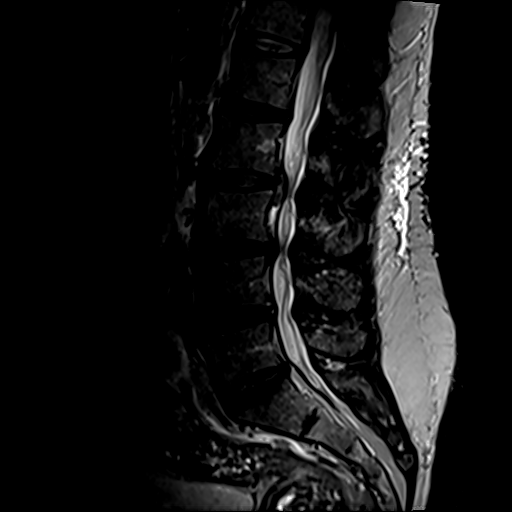
[im 9/12]
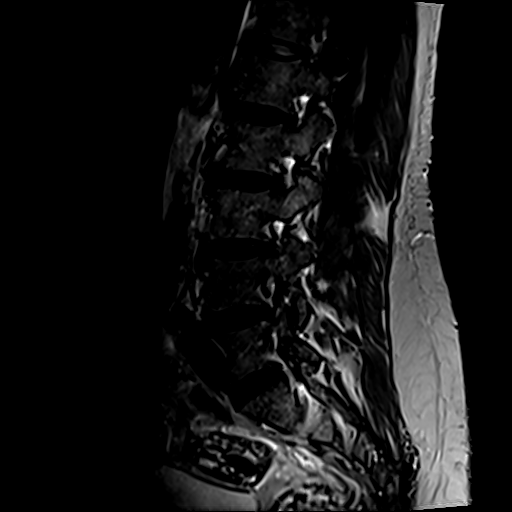

[Series 4: T2 · sagittal · 4.0mm · 1.02mm/px · 5 of 12 slices shown (1 of 2)]
[im 1/12]
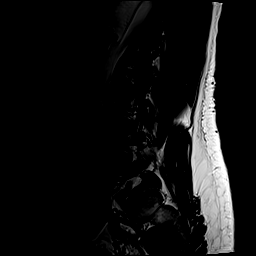
[im 3/12]
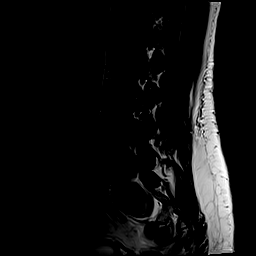
[im 6/12]
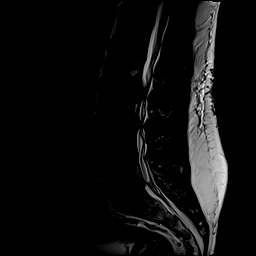
[im 9/12]
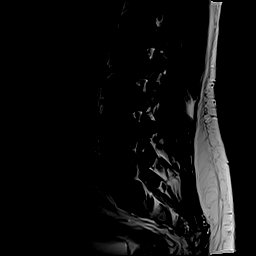
[im 12/12]
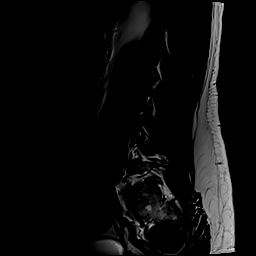

[Series 5: T1 · axial · 4.0mm · 0.78mm/px · z∈[-61,+128]mm · 11 of 41 slices shown (2 of 2)]
[im 3/41]
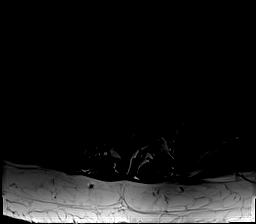
[im 6/41]
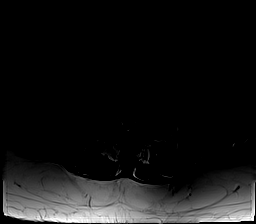
[im 8/41]
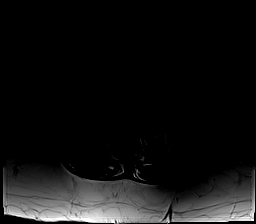
[im 13/41]
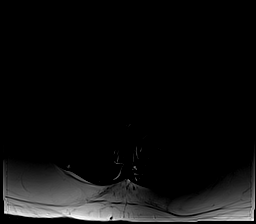
[im 18/41]
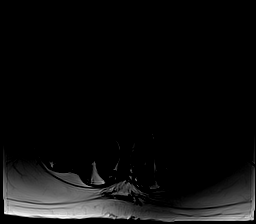
[im 21/41]
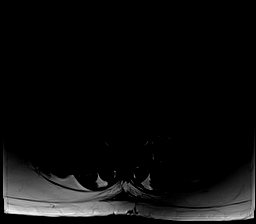
[im 23/41]
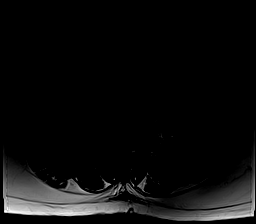
[im 28/41]
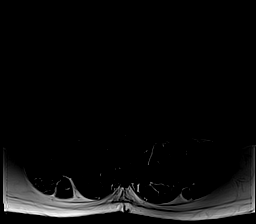
[im 33/41]
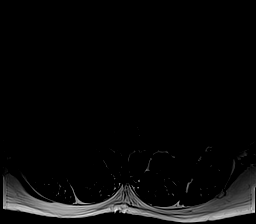
[im 36/41]
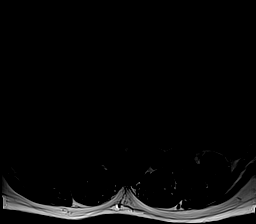
[im 38/41]
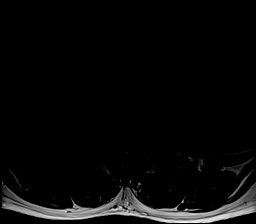

[Series 6: T2 · axial · 4.0mm · 0.78mm/px · z∈[-62,+128]mm · 11 of 41 slices shown (2 of 2)]
[im 3/41]
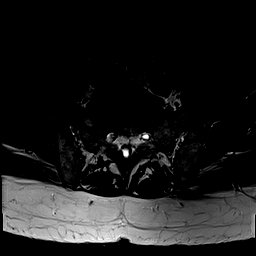
[im 6/41]
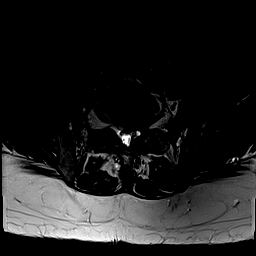
[im 8/41]
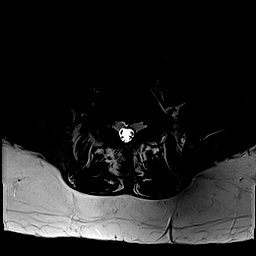
[im 13/41]
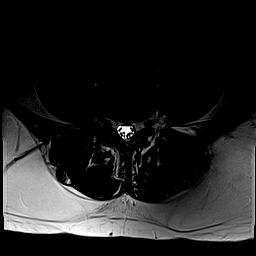
[im 18/41]
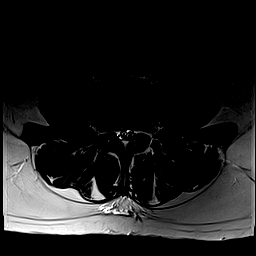
[im 21/41]
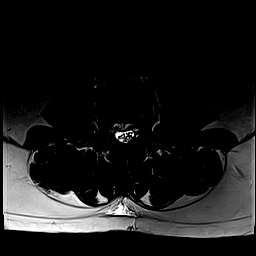
[im 23/41]
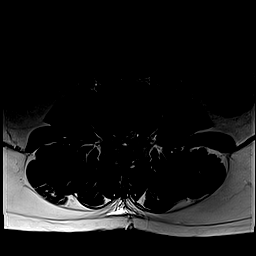
[im 28/41]
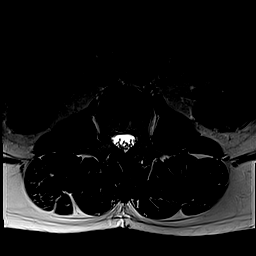
[im 33/41]
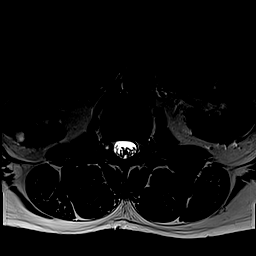
[im 36/41]
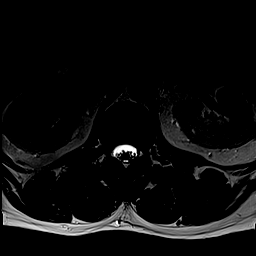
[im 38/41]
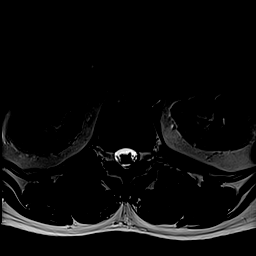

[35 of 48 positions shown; findings below may reference images not displayed]

FINDINGS: Segmentation: Lumbar segmentation appears to be normal and will be
designated as such for this report.

Alignment:  Preserved lumbar lordosis.  No spondylolisthesis.

Vertebrae: No marrow edema or evidence of acute osseous abnormality.
Visualized bone marrow signal is within normal limits. Intact
visible sacrum.

Conus medullaris and cauda equina: Conus extends to the L1 level. No
lower spinal cord or conus signal abnormality.

Paraspinal and other soft tissues: Negative.

Disc levels:

There is a degree of congenital lumbar spinal canal narrowing due to
short pedicle distance.

T12-L1:  Negative.

L1-L2: Subtle disc bulging eccentric to the left and primarily
subarticular. No spinal or convincing lateral recess stenosis.
Borderline to mild left L1 foraminal stenosis.

L2-L3: Complex moderate to large disc extrusion (series 4, image 6,
series 3, image 6 and series 6, image 19) in the midline and
eccentric to the right superimposed on circumferential disc bulging.
Mostly caudal migration of disc with up to severe associated spinal
and right lateral recess stenosis when superimposed on epidural
lipomatosis. Mild bilateral L2 neural foraminal stenosis is fairly
symmetric and related to disc bulging with endplate spurring.

L3-L4: Lobulated rightward circumferential disc bulge with endplate
spurring. Superimposed mild facet and ligament flavum hypertrophy
and mild epidural lipomatosis. Moderate spinal stenosis with mild
right lateral recess stenosis (series 6, image 25). Mild left and
mild to moderate right L3 neural foraminal stenosis.

L4-L5: Mild circumferential disc bulge with endplate spurring and
facet hypertrophy. No significant stenosis.

L5-S1: Minor disc bulging and endplate spurring. Mild facet
hypertrophy greater on the left. Also there is a small 3-4 mm left
side synovial cyst suspected on series 6, images 35 and 36 (see also
series 5, image 36). No spinal stenosis but moderate left lateral
recess stenosis at the descending left S1 nerve level. No foraminal
stenosis.
IMPRESSION: 1. Complex moderate to large rightward and caudal disc extrusion at
L2-L3, superimposed on congenital lumbar spinal canal narrowing.
Subsequent severe spinal and right lateral recess stenosis.
2. Moderate multifactorial spinal stenosis at L3-L4 with mild to
moderate L3 foraminal stenosis greater on the right.
3. And moderate left lateral recess stenosis at L5-S1 in part due to
a degenerative small synovial cyst.

## 2022-08-30 NOTE — Progress Notes (Signed)
Closing encounter!  Pt came in for flu shot was given.Marland KitchenRaechel Dawson  Please sign off on encounter so I can close this out

## 2022-09-26 ENCOUNTER — Ambulatory Visit (INDEPENDENT_AMBULATORY_CARE_PROVIDER_SITE_OTHER): Payer: Medicare Other | Admitting: Emergency Medicine

## 2022-09-26 ENCOUNTER — Encounter: Payer: Self-pay | Admitting: Emergency Medicine

## 2022-09-26 VITALS — BP 132/80 | HR 55 | Temp 98.2°F | Ht 68.0 in | Wt 223.0 lb

## 2022-09-26 DIAGNOSIS — E1159 Type 2 diabetes mellitus with other circulatory complications: Secondary | ICD-10-CM

## 2022-09-26 DIAGNOSIS — E1169 Type 2 diabetes mellitus with other specified complication: Secondary | ICD-10-CM

## 2022-09-26 DIAGNOSIS — Z7985 Long-term (current) use of injectable non-insulin antidiabetic drugs: Secondary | ICD-10-CM

## 2022-09-26 DIAGNOSIS — I152 Hypertension secondary to endocrine disorders: Secondary | ICD-10-CM

## 2022-09-26 DIAGNOSIS — K610 Anal abscess: Secondary | ICD-10-CM

## 2022-09-26 DIAGNOSIS — E785 Hyperlipidemia, unspecified: Secondary | ICD-10-CM

## 2022-09-26 DIAGNOSIS — Z7984 Long term (current) use of oral hypoglycemic drugs: Secondary | ICD-10-CM

## 2022-09-26 LAB — POCT GLYCOSYLATED HEMOGLOBIN (HGB A1C): Hemoglobin A1C: 7.1 % — AB (ref 4.0–5.6)

## 2022-09-26 MED ORDER — TIRZEPATIDE 5 MG/0.5ML ~~LOC~~ SOAJ: 5.0000 mg | SUBCUTANEOUS | 3 refills | Status: DC

## 2022-09-26 MED ORDER — AZITHROMYCIN 250 MG PO TABS: ORAL_TABLET | ORAL | 0 refills | Status: DC

## 2022-09-26 NOTE — Assessment & Plan Note (Signed)
Well-controlled hypertension Continue Tenoretic 50-25 mg daily Well-controlled diabetes with hemoglobin A1c of 7.1 Due to diarrhea side effects recommend to stop metformin Continue Jardiance 10 mg daily Stop semaglutide and start Mounjaro 5 mg weekly Cardiovascular risks associated with diabetes and hypertension discussed Diet and nutrition discussed Follow-up in 3 months

## 2022-09-26 NOTE — Assessment & Plan Note (Signed)
Chronic stable condition Diet and nutrition discussed Continue rosuvastatin 20 mg daily  

## 2022-09-26 NOTE — Progress Notes (Signed)
Larry Dawson 67 y.o.   Chief Complaint  Patient presents with   Medical Management of Chronic Issues    f/u appt, patient states he has a boil near his anus been there for 2 weeks, patient states he stop taking the Ozempic  and Metformin    HISTORY OF PRESENT ILLNESS: This is a 67 y.o. male here for 89-month follow-up of diabetes. However 2 weeks ago stopped metformin and Ozempic due to persistent diarrhea Has developed perianal abscess which is now draining No other complaints or medical concerns today.  HPI   Prior to Admission medications   Medication Sig Start Date End Date Taking? Authorizing Provider  Ascorbic Acid (VITAMIN C PO) Take by mouth.   Yes [provider]  aspirin 81 MG tablet Take 81 mg by mouth daily.   Yes [provider]  atenolol-chlorthalidone (TENORETIC) 50-25 MG tablet TAKE 1 TABLET BY MOUTH DAILY 05/28/22 05/23/23 Yes Letitia Sabala, Eilleen Kempf, MD  cetirizine (ZYRTEC) 10 MG tablet Take 1 tablet (10 mg total) by mouth daily. 05/10/17  Yes Linus Mako B, NP  cholecalciferol (VITAMIN D) 1000 units tablet Take 1,000 Units by mouth daily.   Yes [provider]  cyclobenzaprine (FLEXERIL) 10 MG tablet Take 1 tablet (10 mg total) by mouth 3 (three) times daily as needed for muscle spasms. 12/04/21  Yes Fayrene Helper, PA-C  diclofenac (VOLTAREN) 75 MG EC tablet Take 1 tablet (75 mg total) by mouth 2 (two) times daily. Take only as needed for pain. 05/19/21  Yes Noris Kulinski, Eilleen Kempf, MD  empagliflozin (JARDIANCE) 10 MG TABS tablet Take 1 tablet (10 mg total) by mouth daily before breakfast. 12/11/21  Yes Niambi Smoak, Eilleen Kempf, MD  ibuprofen (ADVIL) 600 MG tablet Take 1 tablet (600 mg total) by mouth every 6 (six) hours as needed. 12/04/21  Yes Fayrene Helper, PA-C  Multiple Vitamins-Minerals (ZINC PO) Take by mouth.   Yes [provider]  Omega-3 Fatty Acids (FISH OIL) 1000 MG CAPS Take by mouth.   Yes [provider]  omeprazole  (PRILOSEC) 20 MG capsule Take 20 mg by mouth daily.   Yes [provider]  rosuvastatin (CRESTOR) 20 MG tablet TAKE 1 TABLET(20 MG) BY MOUTH DAILY 07/11/21  Yes Marin Wisner, Eilleen Kempf, MD  sildenafil (VIAGRA) 100 MG tablet Take 0.5-1 tablets (50-100 mg total) by mouth daily as needed for erectile dysfunction. 10/04/20  Yes Kellyn Mccary, Eilleen Kempf, MD  metFORMIN (GLUCOPHAGE) 1000 MG tablet TAKE 1 TABLET BY MOUTH TWICE A DAY WITH A MEAL Patient not taking: Reported on 09/26/2022 04/11/22   Georgina Quint, MD  Semaglutide, 1 MG/DOSE, 4 MG/3ML SOPN Inject 1 mg as directed once a week. Patient not taking: Reported on 09/26/2022 06/26/22   Georgina Quint, MD  Turmeric POWD by Does not apply route daily. Patient not taking: Reported on 06/26/2022    [provider]    Allergies  Allergen Reactions   Ace Inhibitors Swelling    Suspected angioedema   Penicillins Hives, Rash and Other (See Comments)    Throat swells   Latex Hives, Swelling and Rash    Patient Active Problem List   Diagnosis Date Noted   Lower urinary tract symptoms (LUTS) 06/26/2022   Lumbar degenerative disc disease 03/16/2021   Lumbosacral pain 03/16/2021   Erectile dysfunction 10/04/2020   Dyslipidemia associated with type 2 diabetes mellitus (HCC) 08/06/2018   Dyslipidemia 08/06/2018   Hypertension associated with type 2 diabetes mellitus (HCC) 07/18/2011  Past Medical History:  Diagnosis Date   Allergy    Anal fissure    COPD (chronic obstructive pulmonary disease) (HCC)    Albuterol PRN.   Hydrocele    Hyperlipidemia    Hypertension    IBS (irritable bowel syndrome) 05/22/13   per chart note-pt denied    Past Surgical History:  Procedure Laterality Date   HERNIA REPAIR  prior to 2013   HYDROCELE EXCISION Bilateral 11/23/2013   Procedure: HYDROCELECTOMY ADULT;  Surgeon: Valetta Fuller, MD;  Location: [facility];  Service: Urology;  Laterality: Bilateral;   VASECTOMY   prior to 2013    Social History   Socioeconomic History   Marital status: Married    Spouse name: Not on file   Number of children: Not on file   Years of education: Not on file   Highest education level: Not on file  Occupational History   Not on file  Tobacco Use   Smoking status: Former    Types: Cigarettes   Smokeless tobacco: Never  Vaping Use   Vaping Use: Never used  Substance and Sexual Activity   Alcohol use: Yes    Comment: rarely   Drug use: No   Sexual activity: Not on file  Other Topics Concern   Not on file  Social History Narrative   Marital status: married      Employment: [employer]; drives all over.  Owns company.      Tobacco:  Quit smoking in 2005; smoked 25 years      Alcohol:  Rare beer      Drugs:  None      Exercise:  None   Daily caffeine    Social Determinants of Health   Financial Resource Strain: Not on file  Food Insecurity: Not on file  Transportation Needs: Not on file  Physical Activity: Not on file  Stress: Not on file  Social Connections: Not on file  Intimate Partner Violence: Not on file    Family History  Problem Relation Age of Onset   Diabetes Father    Hypertension Father    Diabetes Brother    Hypertension Brother    Diabetes Brother    Diabetes Brother      Review of Systems  Constitutional: Negative.  Negative for chills and fever.  HENT: Negative.  Negative for congestion and sore throat.   Respiratory: Negative.  Negative for cough and shortness of breath.   Cardiovascular: Negative.  Negative for chest pain and palpitations.  Gastrointestinal:  Positive for diarrhea. Negative for abdominal pain, nausea and vomiting.  Genitourinary: Negative.  Negative for dysuria and hematuria.  Skin: Negative.  Negative for rash.  Neurological: Negative.  Negative for dizziness and headaches.  All other systems reviewed and are negative.   Vitals:   09/26/22 0810  BP: 132/80  Pulse: (!) 55  Temp: 98.2 F  (36.8 C)  SpO2: 98%    Physical Exam Vitals reviewed.  Constitutional:      Appearance: Normal appearance.  HENT:     Head: Normocephalic.  Eyes:     Extraocular Movements: Extraocular movements intact.  Cardiovascular:     Rate and Rhythm: Normal rate.     Heart sounds: Normal heart sounds.  Pulmonary:     Effort: Pulmonary effort is normal.     Breath sounds: Normal breath sounds.  Abdominal:     Palpations: Abdomen is soft.     Tenderness: There is no abdominal tenderness.  Comments: Positive perianal abscess presently draining purulent material  Neurological:     General: No focal deficit present.     Mental Status: He is alert and oriented to person, place, and time.  Psychiatric:        Mood and Affect: Mood normal.        Behavior: Behavior normal.    Results for orders placed or performed in visit on 09/26/22 (from the past 24 hour(s))  POCT glycosylated hemoglobin (Hb A1C)     Status: Abnormal   Collection Time: 09/26/22  8:41 AM  Result Value Ref Range   Hemoglobin A1C 7.1 (A) 4.0 - 5.6 %   HbA1c POC (<> result, manual entry)     HbA1c, POC (prediabetic range)     HbA1c, POC (controlled diabetic range)       ASSESSMENT & PLAN: A total of 44 minutes was spent with the patient and counseling/coordination of care regarding preparing for this visit, review of most recent office visit notes, review of most recent blood work results including interpretation of today's hemoglobin A1c, review of multiple chronic medical conditions under management, review of all medications and changes made, cardiovascular risk associated with hypertension and diabetes, presence of perianal abscess and need for antibiotics and colorectal evaluation, education on nutrition, prognosis, documentation and need for follow-up  Problem List Items Addressed This Visit       Cardiovascular and Mediastinum   Hypertension associated with type 2 diabetes mellitus (HCC) - Primary     Well-controlled hypertension Continue Tenoretic 50-25 mg daily Well-controlled diabetes with hemoglobin A1c of 7.1 Due to diarrhea side effects recommend to stop metformin Continue Jardiance 10 mg daily Stop semaglutide and start Mounjaro 5 mg weekly Cardiovascular risks associated with diabetes and hypertension discussed Diet and nutrition discussed Follow-up in 3 months      Relevant Medications   tirzepatide (MOUNJARO) 5 MG/0.5ML Pen   Other Relevant Orders   POCT glycosylated hemoglobin (Hb A1C) (Completed)     Endocrine   Dyslipidemia associated with type 2 diabetes mellitus (HCC)    Chronic stable condition Diet and nutrition discussed Continue rosuvastatin 20 mg daily       Relevant Medications   tirzepatide (MOUNJARO) 5 MG/0.5ML Pen   Other Relevant Orders   POCT glycosylated hemoglobin (Hb A1C) (Completed)     Other   Perianal abscess    Most likely secondary to chronic diarrhea Presently draining Recommend to start antibiotic.  Allergic to penicillins Start daily azithromycin for 5 days Recommend colorectal evaluation Referral placed today.      Relevant Medications   azithromycin (ZITHROMAX) 250 MG tablet   Other Relevant Orders   Ambulatory referral to Colorectal Surgery   Patient Instructions  Anorectal Abscess An abscess is an infected area that contains pus. An anorectal abscess is an abscess that forms near the opening of the butt (anus) or around the rectum. If it is not treated, the abscess can grow and cause other problems. These problems may include a more severe body-wide infection or pain, especially when you poop. What are the causes? An anorectal abscess is caused by clogged glands or an infection in: The anus. The area between the anus and the scrotum in males or between the anus and the vagina in females (perineum). What increases the risk? You may be more likely to get this condition if: You are pregnant. You have diabetes or an  inflammatory bowel disease, such as Crohn's disease. You have had anal sex. You have  certain cancers. These include rectal cancer, leukemia, or lymphoma. You have been given medicines to kill cancer cells (chemotherapy). You have cracks in your anus (anal fissures). These cracks may form if you have constipation that lasts for a long time or does not go away. You have a sexually transmitted infection (STI). What are the signs or symptoms? The main symptom of this condition is pain. It may be a throbbing pain that gets worse when you poop. Other symptoms include: Swelling, redness, and pus near the anus. The redness may go beyond the abscess and look like a red streak on the skin. A painful lump or tissue near the anus. Bleeding or pus-like discharge from the area. Fever or night sweats. Weakness and tiredness (fatigue). Constipation or pain when pooping. Pain in the lower part of your stomach. How is this diagnosed? This condition is diagnosed based on your medical history and a physical exam. The exam may include: A digital rectal exam. This is when your health care provider puts a gloved finger into your anus and rectum to look for problems. A vaginal exam. This is when your provider looks at the vagina for problems. Using a tube with a light and camera on the end (scope) to look at the rectum. You may also need tests, such as an MRI or CT scan. How is this treated? Treatment for this condition may include: Incision and drainage of the abscess. This is when an incision is made over the abscess to drain the pus. Medicines. These may include pain medicines, medicine to help you poop (laxatives), stool softeners, or antibiotics. Follow these instructions at home: Medicines Take over-the-counter and prescription medicines only as told by your provider. If you were prescribed antibiotics, use them as told by your provider. Do not stop using the antibiotic even if you start to feel  better. Ask your provider if the medicine prescribed to you requires you to avoid driving or using machinery. Wound care  Follow instructions from your provider about how to take care of your wound. Make sure you: Wash your hands with soap and water for at least 20 seconds before and after you change your bandage (dressing). If soap and water are not available, use hand sanitizer. Change your dressing as told by your provider. Leave stitches (sutures), skin glue, or tape strips in place. These skin closures may need to stay in place for 2 weeks or longer. If tape strip edges start to loosen and curl up, you may trim the loose edges. Do not remove tape strips completely unless your provider tells you to do that. If one or more tubes (drains) were placed to drain the pus, be careful not to pull at them. Your provider will tell you how long they need to stay in place. Check your abscess area every day for signs of infection. Check for: More redness, swelling, or pain. More fluid or blood. Warmth. Pus or a bad smell. Managing pain, stiffness, and swelling  To help with pain, try sitting: On a heating pad with the setting on low. On an inflatable donut-shaped cushion. If told, put ice on the affected area. Put ice in a plastic bag. Place a towel between your skin and the bag. Leave the ice on for 20 minutes, 2-3 times a day. If your skin turns bright red, remove the ice right away to prevent skin damage. The risk of damage is higher if you cannot feel pain, heat, or cold. General instructions Take a sitz bath  3-4 times a day and after you poop. A sitz bath is a shallow, warm water bath that you sit down in. The water should only come up to your hips and should cover your butt. Follow instructions from your provider about what you may eat and drink. Keep all follow-up visits. Your provider may need to remove stitches and make sure that the abscess has gone away. Where to find more  information American Society of Colon & Rectal Surgeons: fascrs.org Contact a health care provider if: You have any signs of an infection. You have a fever or chills. You have trouble pooping. Get help right away if: You have trouble moving or using your legs. You have severe pain or pain that gets worse. You have swelling that gets worse. You have a lot more bleeding or passing of pus. This information is not intended to replace advice given to you by your health care provider. Make sure you discuss any questions you have with your health care provider. Document Revised: 11/15/2021 Document Reviewed: 11/15/2021 Elsevier Patient Education  2024 Elsevier Inc.      Edwina Barth, MD [facility]

## 2022-09-26 NOTE — Assessment & Plan Note (Signed)
Most likely secondary to chronic diarrhea Presently draining Recommend to start antibiotic.  Allergic to penicillins Start daily azithromycin for 5 days Recommend colorectal evaluation Referral placed today.

## 2022-09-26 NOTE — Patient Instructions (Signed)
Anorectal Abscess An abscess is an infected area that contains pus. An anorectal abscess is an abscess that forms near the opening of the butt (anus) or around the rectum. If it is not treated, the abscess can grow and cause other problems. These problems may include a more severe body-wide infection or pain, especially when you poop. What are the causes? An anorectal abscess is caused by clogged glands or an infection in: The anus. The area between the anus and the scrotum in males or between the anus and the vagina in females (perineum). What increases the risk? You may be more likely to get this condition if: You are pregnant. You have diabetes or an inflammatory bowel disease, such as Crohn's disease. You have had anal sex. You have certain cancers. These include rectal cancer, leukemia, or lymphoma. You have been given medicines to kill cancer cells (chemotherapy). You have cracks in your anus (anal fissures). These cracks may form if you have constipation that lasts for a long time or does not go away. You have a sexually transmitted infection (STI). What are the signs or symptoms? The main symptom of this condition is pain. It may be a throbbing pain that gets worse when you poop. Other symptoms include: Swelling, redness, and pus near the anus. The redness may go beyond the abscess and look like a red streak on the skin. A painful lump or tissue near the anus. Bleeding or pus-like discharge from the area. Fever or night sweats. Weakness and tiredness (fatigue). Constipation or pain when pooping. Pain in the lower part of your stomach. How is this diagnosed? This condition is diagnosed based on your medical history and a physical exam. The exam may include: A digital rectal exam. This is when your health care provider puts a gloved finger into your anus and rectum to look for problems. A vaginal exam. This is when your provider looks at the vagina for problems. Using a tube with a  light and camera on the end (scope) to look at the rectum. You may also need tests, such as an MRI or CT scan. How is this treated? Treatment for this condition may include: Incision and drainage of the abscess. This is when an incision is made over the abscess to drain the pus. Medicines. These may include pain medicines, medicine to help you poop (laxatives), stool softeners, or antibiotics. Follow these instructions at home: Medicines Take over-the-counter and prescription medicines only as told by your provider. If you were prescribed antibiotics, use them as told by your provider. Do not stop using the antibiotic even if you start to feel better. Ask your provider if the medicine prescribed to you requires you to avoid driving or using machinery. Wound care  Follow instructions from your provider about how to take care of your wound. Make sure you: Wash your hands with soap and water for at least 20 seconds before and after you change your bandage (dressing). If soap and water are not available, use hand sanitizer. Change your dressing as told by your provider. Leave stitches (sutures), skin glue, or tape strips in place. These skin closures may need to stay in place for 2 weeks or longer. If tape strip edges start to loosen and curl up, you may trim the loose edges. Do not remove tape strips completely unless your provider tells you to do that. If one or more tubes (drains) were placed to drain the pus, be careful not to pull at them. Your provider will   tell you how long they need to stay in place. Check your abscess area every day for signs of infection. Check for: More redness, swelling, or pain. More fluid or blood. Warmth. Pus or a bad smell. Managing pain, stiffness, and swelling  To help with pain, try sitting: On a heating pad with the setting on low. On an inflatable donut-shaped cushion. If told, put ice on the affected area. Put ice in a plastic bag. Place a towel  between your skin and the bag. Leave the ice on for 20 minutes, 2-3 times a day. If your skin turns bright red, remove the ice right away to prevent skin damage. The risk of damage is higher if you cannot feel pain, heat, or cold. General instructions Take a sitz bath 3-4 times a day and after you poop. A sitz bath is a shallow, warm water bath that you sit down in. The water should only come up to your hips and should cover your butt. Follow instructions from your provider about what you may eat and drink. Keep all follow-up visits. Your provider may need to remove stitches and make sure that the abscess has gone away. Where to find more information American Society of Colon & Rectal Surgeons: fascrs.org Contact a health care provider if: You have any signs of an infection. You have a fever or chills. You have trouble pooping. Get help right away if: You have trouble moving or using your legs. You have severe pain or pain that gets worse. You have swelling that gets worse. You have a lot more bleeding or passing of pus. This information is not intended to replace advice given to you by your health care provider. Make sure you discuss any questions you have with your health care provider. Document Revised: 11/15/2021 Document Reviewed: 11/15/2021 Elsevier Patient Education  2024 Elsevier Inc.  

## 2022-10-01 ENCOUNTER — Encounter: Payer: Self-pay | Admitting: Emergency Medicine

## 2022-10-05 ENCOUNTER — Other Ambulatory Visit: Payer: Self-pay | Admitting: Emergency Medicine

## 2022-10-05 DIAGNOSIS — E1169 Type 2 diabetes mellitus with other specified complication: Secondary | ICD-10-CM

## 2022-10-05 NOTE — Progress Notes (Signed)
Please sign off on this encounter so we can close this out for patient. This is old

## 2022-12-19 ENCOUNTER — Encounter: Payer: Self-pay | Admitting: Emergency Medicine

## 2022-12-19 ENCOUNTER — Ambulatory Visit (INDEPENDENT_AMBULATORY_CARE_PROVIDER_SITE_OTHER): Payer: Medicare Other | Admitting: Emergency Medicine

## 2022-12-19 VITALS — BP 130/88 | HR 70 | Temp 98.1°F | Ht 68.0 in | Wt 223.4 lb

## 2022-12-19 DIAGNOSIS — Z7984 Long term (current) use of oral hypoglycemic drugs: Secondary | ICD-10-CM | POA: Diagnosis not present

## 2022-12-19 DIAGNOSIS — I152 Hypertension secondary to endocrine disorders: Secondary | ICD-10-CM | POA: Diagnosis not present

## 2022-12-19 DIAGNOSIS — E1159 Type 2 diabetes mellitus with other circulatory complications: Secondary | ICD-10-CM

## 2022-12-19 DIAGNOSIS — E1169 Type 2 diabetes mellitus with other specified complication: Secondary | ICD-10-CM | POA: Diagnosis not present

## 2022-12-19 DIAGNOSIS — E785 Hyperlipidemia, unspecified: Secondary | ICD-10-CM

## 2022-12-19 LAB — POCT GLYCOSYLATED HEMOGLOBIN (HGB A1C): Hemoglobin A1C: 8.3 % — AB (ref 4.0–5.6)

## 2022-12-19 LAB — COMPREHENSIVE METABOLIC PANEL
ALT: 26 U/L (ref 0–53)
AST: 21 U/L (ref 0–37)
Albumin: 4 g/dL (ref 3.5–5.2)
Alkaline Phosphatase: 53 U/L (ref 39–117)
BUN: 21 mg/dL (ref 6–23)
CO2: 30 meq/L (ref 19–32)
Calcium: 9.7 mg/dL (ref 8.4–10.5)
Chloride: 98 meq/L (ref 96–112)
Creatinine, Ser: 1.43 mg/dL (ref 0.40–1.50)
GFR: 51.19 mL/min — ABNORMAL LOW (ref >60.00)
Glucose, Bld: 110 mg/dL — ABNORMAL HIGH (ref 70–99)
Potassium: 3.3 meq/L — ABNORMAL LOW (ref 3.5–5.1)
Sodium: 138 meq/L (ref 135–145)
Total Bilirubin: 1.5 mg/dL — ABNORMAL HIGH (ref 0.2–1.2)
Total Protein: 7.4 g/dL (ref 6.0–8.3)

## 2022-12-19 LAB — LIPID PANEL
Cholesterol: 109 mg/dL (ref 0–200)
HDL: 45.2 mg/dL (ref >39.00)
LDL Cholesterol: 40 mg/dL (ref 0–99)
NonHDL: 63.59
Total CHOL/HDL Ratio: 2
Triglycerides: 116 mg/dL (ref 0.0–149.0)
VLDL: 23.2 mg/dL (ref 0.0–40.0)

## 2022-12-19 LAB — MICROALBUMIN / CREATININE URINE RATIO
Creatinine,U: 180.1 mg/dL
Microalb Creat Ratio: 0.4 mg/g (ref 0.0–30.0)
Microalb, Ur: <0.7 mg/dL (ref 0.0–1.9)

## 2022-12-19 MED ORDER — ROSUVASTATIN CALCIUM 20 MG PO TABS
20.0000 mg | ORAL_TABLET | Freq: Every day | ORAL | 3 refills | Status: DC
Start: 2022-12-19 — End: 2023-12-17

## 2022-12-19 MED ORDER — METFORMIN HCL 500 MG PO TABS
500.0000 mg | ORAL_TABLET | Freq: Two times a day (BID) | ORAL | 3 refills | Status: DC
Start: 2022-12-19 — End: 2024-01-13

## 2022-12-19 MED ORDER — GLIPIZIDE 5 MG PO TABS
5.0000 mg | ORAL_TABLET | Freq: Two times a day (BID) | ORAL | 3 refills | Status: DC
Start: 2022-12-19 — End: 2023-05-11

## 2022-12-19 NOTE — Patient Instructions (Signed)
Continue metformin 500 mg twice a day Start glipizide 5 mg twice a day Blood work today Follow-up in 3 months  Diabetes Mellitus and Nutrition, Adult When you have diabetes, or diabetes mellitus, it is very important to have healthy eating habits because your blood sugar (glucose) levels are greatly affected by what you eat and drink. Eating healthy foods in the right amounts, at about the same times every day, can help you: Manage your blood glucose. Lower your risk of heart disease. Improve your blood pressure. Reach or maintain a healthy weight. What can affect my meal plan? Every person with diabetes is different, and each person has different needs for a meal plan. Your health care provider may recommend that you work with a dietitian to make a meal plan that is best for you. Your meal plan may vary depending on factors such as: The calories you need. The medicines you take. Your weight. Your blood glucose, blood pressure, and cholesterol levels. Your activity level. Other health conditions you have, such as heart or kidney disease. How do carbohydrates affect me? Carbohydrates, also called carbs, affect your blood glucose level more than any other type of food. Eating carbs raises the amount of glucose in your blood. It is important to know how many carbs you can safely have in each meal. This is different for every person. Your dietitian can help you calculate how many carbs you should have at each meal and for each snack. How does alcohol affect me? Alcohol can cause a decrease in blood glucose (hypoglycemia), especially if you use insulin or take certain diabetes medicines by mouth. Hypoglycemia can be a life-threatening condition. Symptoms of hypoglycemia, such as sleepiness, dizziness, and confusion, are similar to symptoms of having too much alcohol. Do not drink alcohol if: Your health care provider tells you not to drink. You are pregnant, may be pregnant, or are planning to  become pregnant. If you drink alcohol: Limit how much you have to: 0-1 drink a day for women. 0-2 drinks a day for men. Know how much alcohol is in your drink. In the [place] one drink equals one 12 oz bottle of beer (355 mL), one 5 oz glass of wine (148 mL), or one 1 oz glass of hard liquor (44 mL). Keep yourself hydrated with water, diet soda, or unsweetened iced tea. Keep in mind that regular soda, juice, and other mixers may contain a lot of sugar and must be counted as carbs. What are tips for following this plan?  Reading food labels Start by checking the serving size on the Nutrition Facts label of packaged foods and drinks. The number of calories and the amount of carbs, fats, and other nutrients listed on the label are based on one serving of the item. Many items contain more than one serving per package. Check the total grams (g) of carbs in one serving. Check the number of grams of saturated fats and trans fats in one serving. Choose foods that have a low amount or none of these fats. Check the number of milligrams (mg) of salt (sodium) in one serving. Most people should limit total sodium intake to less than 2,300 mg per day. Always check the nutrition information of foods labeled as "low-fat" or "nonfat." These foods may be higher in added sugar or refined carbs and should be avoided. Talk to your dietitian to identify your daily goals for nutrients listed on the label. Shopping Avoid buying canned, pre-made, or processed foods. These foods tend  to be high in fat, sodium, and added sugar. Shop around the outside edge of the grocery store. This is where you will most often find fresh fruits and vegetables, bulk grains, fresh meats, and fresh dairy products. Cooking Use low-heat cooking methods, such as baking, instead of high-heat cooking methods, such as deep frying. Cook using healthy oils, such as olive, canola, or sunflower oil. Avoid cooking with butter, cream, or high-fat  meats. Meal planning Eat meals and snacks regularly, preferably at the same times every day. Avoid going long periods of time without eating. Eat foods that are high in fiber, such as fresh fruits, vegetables, beans, and whole grains. Eat 4-6 oz (112-168 g) of lean protein each day, such as lean meat, chicken, fish, eggs, or tofu. One ounce (oz) (28 g) of lean protein is equal to: 1 oz (28 g) of meat, chicken, or fish. 1 egg.  cup (62 g) of tofu. Eat some foods each day that contain healthy fats, such as avocado, nuts, seeds, and fish. What foods should I eat? Fruits Berries. Apples. Oranges. Peaches. Apricots. Plums. Grapes. Mangoes. Papayas. Pomegranates. Kiwi. Cherries. Vegetables Leafy greens, including lettuce, spinach, kale, chard, collard greens, mustard greens, and cabbage. Beets. Cauliflower. Broccoli. Carrots. Green beans. Tomatoes. Peppers. Onions. Cucumbers. Brussels sprouts. Grains Whole grains, such as whole-wheat or whole-grain bread, crackers, tortillas, cereal, and pasta. Unsweetened oatmeal. Quinoa. Brown or wild rice. Meats and other proteins Seafood. Poultry without skin. Lean cuts of poultry and beef. Tofu. Nuts. Seeds. Dairy Low-fat or fat-free dairy products such as milk, yogurt, and cheese. The items listed above may not be a complete list of foods and beverages you can eat and drink. Contact a dietitian for more information. What foods should I avoid? Fruits Fruits canned with syrup. Vegetables Canned vegetables. Frozen vegetables with butter or cream sauce. Grains Refined white flour and flour products such as bread, pasta, snack foods, and cereals. Avoid all processed foods. Meats and other proteins Fatty cuts of meat. Poultry with skin. Breaded or fried meats. Processed meat. Avoid saturated fats. Dairy Full-fat yogurt, cheese, or milk. Beverages Sweetened drinks, such as soda or iced tea. The items listed above may not be a complete list of foods and  beverages you should avoid. Contact a dietitian for more information. Questions to ask a health care provider Do I need to meet with a certified diabetes care and education specialist? Do I need to meet with a dietitian? What number can I call if I have questions? When are the best times to check my blood glucose? Where to find more information: American Diabetes Association: diabetes.org Academy of Nutrition and Dietetics: eatright.[employer] of Diabetes and Digestive and Kidney Diseases: [email or web address].si Association of Diabetes Care & Education Specialists: diabeteseducator.org Summary It is important to have healthy eating habits because your blood sugar (glucose) levels are greatly affected by what you eat and drink. It is important to use alcohol carefully. A healthy meal plan will help you manage your blood glucose and lower your risk of heart disease. Your health care provider may recommend that you work with a dietitian to make a meal plan that is best for you. This information is not intended to replace advice given to you by your health care provider. Make sure you discuss any questions you have with your health care provider. Document Revised: 10/28/2019 Document Reviewed: 10/28/2019 Elsevier Patient Education  2024 [employer].

## 2022-12-19 NOTE — Assessment & Plan Note (Signed)
Uncontrolled diabetes with hemoglobin A1c at 8.3. Continue metformin.  Start glipizide 5 mg twice a day and continue rosuvastatin 20 mg daily.

## 2022-12-19 NOTE — Progress Notes (Signed)
Larry Dawson 67 y.o.   Chief Complaint  Patient presents with   Medical Management of Chronic Issues    No concerns     HISTORY OF PRESENT ILLNESS: This is a 67 y.o. male here for 85-month follow-up of diabetes and hypertension Lab Results  Component Value Date   HGBA1C 7.1 (A) 09/26/2022   BP Readings from Last 3 Encounters:  09/26/22 132/80  06/26/22 128/82  03/27/22 136/84   Wt Readings from Last 3 Encounters:  12/19/22 223 lb 6 oz (101.3 kg)  09/26/22 223 lb (101.2 kg)  06/26/22 216 lb (98 kg)     HPI   Prior to Admission medications   Medication Sig Start Date End Date Taking? Authorizing Provider  Ascorbic Acid (VITAMIN C PO) Take by mouth.    [provider]  atenolol-chlorthalidone (TENORETIC) 50-25 MG tablet TAKE 1 TABLET BY MOUTH DAILY 05/28/22 05/23/23  Georgina Quint, MD  cetirizine (ZYRTEC) 10 MG tablet Take 1 tablet (10 mg total) by mouth daily. 05/10/17   Georgetta Haber, NP  cholecalciferol (VITAMIN D) 1000 units tablet Take 1,000 Units by mouth daily.    [provider]  cyclobenzaprine (FLEXERIL) 10 MG tablet Take 1 tablet (10 mg total) by mouth 3 (three) times daily as needed for muscle spasms. 12/04/21   Fayrene Helper, PA-C  ibuprofen (ADVIL) 600 MG tablet Take 1 tablet (600 mg total) by mouth every 6 (six) hours as needed. 12/04/21   Fayrene Helper, PA-C  Multiple Vitamins-Minerals (ZINC PO) Take by mouth.    [provider]  Omega-3 Fatty Acids (FISH OIL) 1000 MG CAPS Take by mouth.    [provider]  sildenafil (VIAGRA) 100 MG tablet Take 0.5-1 tablets (50-100 mg total) by mouth daily as needed for erectile dysfunction. 10/04/20   Georgina Quint, MD  Turmeric POWD by Does not apply route daily. Patient not taking: Reported on 06/26/2022    [provider]    Allergies  Allergen Reactions   Ace Inhibitors Swelling    Suspected angioedema   Penicillins Hives, Rash and Other (See Comments)     Throat swells   Latex Hives, Swelling and Rash    Patient Active Problem List   Diagnosis Date Noted   Perianal abscess 09/26/2022   Lower urinary tract symptoms (LUTS) 06/26/2022   Lumbar degenerative disc disease 03/16/2021   Lumbosacral pain 03/16/2021   Erectile dysfunction 10/04/2020   Dyslipidemia associated with type 2 diabetes mellitus (HCC) 08/06/2018   Dyslipidemia 08/06/2018   Hypertension associated with type 2 diabetes mellitus (HCC) 07/18/2011    Past Medical History:  Diagnosis Date   Allergy    Anal fissure    COPD (chronic obstructive pulmonary disease) (HCC)    Albuterol PRN.   Hydrocele    Hyperlipidemia    Hypertension    IBS (irritable bowel syndrome) 05/22/13   per chart note-pt denied    Past Surgical History:  Procedure Laterality Date   HERNIA REPAIR  prior to 2013   HYDROCELE EXCISION Bilateral 11/23/2013   Procedure: HYDROCELECTOMY ADULT;  Surgeon: Valetta Fuller, MD;  Location: [facility];  Service: Urology;  Laterality: Bilateral;   VASECTOMY  prior to 2013    Social History   Socioeconomic History   Marital status: Married    Spouse name: Not on file   Number of children: Not on file   Years of education: Not on file   Highest education level: Associate degree: occupational, Scientist, product/process development,  or vocational program  Occupational History   Not on file  Tobacco Use   Smoking status: Former    Types: Cigarettes   Smokeless tobacco: Never  Vaping Use   Vaping status: Never Used  Substance and Sexual Activity   Alcohol use: Yes    Comment: rarely   Drug use: No   Sexual activity: Not on file  Other Topics Concern   Not on file  Social History Narrative   Marital status: married      Employment: [employer]; drives all over.  Owns company.      Tobacco:  Quit smoking in 2005; smoked 25 years      Alcohol:  Rare beer      Drugs:  None      Exercise:  None   Daily caffeine    Social Determinants of Health    Financial Resource Strain: Medium Risk (12/18/2022)   Overall Financial Resource Strain (CARDIA)    Difficulty of Paying Living Expenses: Somewhat hard  Food Insecurity: No Food Insecurity (12/18/2022)   Hunger Vital Sign    Worried About Running Out of Food in the Last Year: Never true    Ran Out of Food in the Last Year: Never true  Transportation Needs: No Transportation Needs (12/18/2022)   PRAPARE - Administrator, Civil Service (Medical): No    Lack of Transportation (Non-Medical): No  Physical Activity: Sufficiently Active (12/18/2022)   Exercise Vital Sign    Days of Exercise per Week: 4 days    Minutes of Exercise per Session: 60 min  Stress: Stress Concern Present (12/18/2022)   [employer] of Occupational Health - Occupational Stress Questionnaire    Feeling of Stress : Very much  Social Connections: Socially Integrated (12/18/2022)   Social Connection and Isolation Panel [NHANES]    Frequency of Communication with Friends and Family: More than three times a week    Frequency of Social Gatherings with Friends and Family: Once a week    Attends Religious Services: More than 4 times per year    Active Member of [employer] or Organizations: Yes    Attends Engineer, structural: More than 4 times per year    Marital Status: Married  Catering manager Violence: Unknown (12/28/2021)   Received from [employer], [facility]   HITS    Physically Hurt: Not on file    Insult or Talk Down To: Not on file    Threaten Physical Harm: Not on file    Scream or Curse: Not on file    Family History  Problem Relation Age of Onset   Diabetes Father    Hypertension Father    Diabetes Brother    Hypertension Brother    Diabetes Brother    Diabetes Brother      Review of Systems  Constitutional: Negative.  Negative for chills and fever.  HENT: Negative.  Negative for congestion and sore throat.   Respiratory: Negative.  Negative for cough and shortness of  breath.   Cardiovascular: Negative.  Negative for chest pain and palpitations.  Gastrointestinal:  Negative for abdominal pain, diarrhea, nausea and vomiting.  Genitourinary: Negative.  Negative for dysuria and hematuria.  Skin: Negative.  Negative for rash.  Neurological: Negative.  Negative for dizziness and headaches.  All other systems reviewed and are negative.   Today's Vitals   12/19/22 1339  BP: 130/88  Pulse: 70  Temp: 98.1 F (36.7 C)  TempSrc: Oral  SpO2: 94%  Weight: 223 lb 6 oz (101.3 kg)  Height: 5\' 8"  (1.727 m)   Body mass index is 33.96 kg/m.   Physical Exam Vitals reviewed.  Constitutional:      Appearance: Normal appearance.  HENT:     Head: Normocephalic.     Mouth/Throat:     Mouth: Mucous membranes are moist.     Pharynx: Oropharynx is clear.  Eyes:     Extraocular Movements: Extraocular movements intact.     Conjunctiva/sclera: Conjunctivae normal.     Pupils: Pupils are equal, round, and reactive to light.  Cardiovascular:     Rate and Rhythm: Normal rate.  Pulmonary:     Effort: Pulmonary effort is normal.  Skin:    General: Skin is warm and dry.  Neurological:     Mental Status: He is alert and oriented to person, place, and time.  Psychiatric:        Mood and Affect: Mood normal.        Behavior: Behavior normal.   Results for orders placed or performed in visit on 12/19/22 (from the past 24 hour(s))  POCT glycosylated hemoglobin (Hb A1C)     Status: Abnormal   Collection Time: 12/19/22  3:30 PM  Result Value Ref Range   Hemoglobin A1C 8.3 (A) 4.0 - 5.6 %   HbA1c POC (<> result, manual entry)     HbA1c, POC (prediabetic range)     HbA1c, POC (controlled diabetic range)       ASSESSMENT & PLAN: A total of 44 minutes was spent with the patient and counseling/coordination of care regarding preparing for this visit, review of most recent office visit notes, review of multiple chronic medical conditions under management, review of most  recent blood work results including interpretation of today's hemoglobin A1c, cardiovascular risks associated with uncontrolled diabetes, review of all medications and changes made, education on nutrition, prognosis, documentation, and need for follow-up.  Problem List Items Addressed This Visit       Cardiovascular and Mediastinum   Hypertension associated with type 2 diabetes mellitus (HCC) - Primary    Well-controlled hypertension Continue Tenoretic 50-25 mg daily Uncontrolled diabetes with hemoglobin A1c at 8.3. Only taking metformin Unable to afford SGLT's or GLP-1 agonists Recommend to continue metformin and start glipizide 5 mg twice a day. Cardiovascular risks associated with uncontrolled diabetes discussed Diet and nutrition discussed. Follow-up in 3 months.      Relevant Medications   glipiZIDE (GLUCOTROL) 5 MG tablet   metFORMIN (GLUCOPHAGE) 500 MG tablet   rosuvastatin (CRESTOR) 20 MG tablet   Other Relevant Orders   POCT glycosylated hemoglobin (Hb A1C)   Urine Microalbumin w/creat. ratio   Comprehensive metabolic panel   Lipid panel     Endocrine   Dyslipidemia associated with type 2 diabetes mellitus (HCC)    Uncontrolled diabetes with hemoglobin A1c at 8.3. Continue metformin.  Start glipizide 5 mg twice a day and continue rosuvastatin 20 mg daily.       Relevant Medications   glipiZIDE (GLUCOTROL) 5 MG tablet   metFORMIN (GLUCOPHAGE) 500 MG tablet   rosuvastatin (CRESTOR) 20 MG tablet   Other Relevant Orders   POCT glycosylated hemoglobin (Hb A1C)   Urine Microalbumin w/creat. ratio   Comprehensive metabolic panel   Lipid panel   Patient Instructions  Continue metformin 500 mg twice a day Start glipizide 5 mg twice a day Blood work today Follow-up in 3 months  Diabetes Mellitus and Nutrition, Adult When you have diabetes, or  diabetes mellitus, it is very important to have healthy eating habits because your blood sugar (glucose) levels are greatly  affected by what you eat and drink. Eating healthy foods in the right amounts, at about the same times every day, can help you: Manage your blood glucose. Lower your risk of heart disease. Improve your blood pressure. Reach or maintain a healthy weight. What can affect my meal plan? Every person with diabetes is different, and each person has different needs for a meal plan. Your health care provider may recommend that you work with a dietitian to make a meal plan that is best for you. Your meal plan may vary depending on factors such as: The calories you need. The medicines you take. Your weight. Your blood glucose, blood pressure, and cholesterol levels. Your activity level. Other health conditions you have, such as heart or kidney disease. How do carbohydrates affect me? Carbohydrates, also called carbs, affect your blood glucose level more than any other type of food. Eating carbs raises the amount of glucose in your blood. It is important to know how many carbs you can safely have in each meal. This is different for every person. Your dietitian can help you calculate how many carbs you should have at each meal and for each snack. How does alcohol affect me? Alcohol can cause a decrease in blood glucose (hypoglycemia), especially if you use insulin or take certain diabetes medicines by mouth. Hypoglycemia can be a life-threatening condition. Symptoms of hypoglycemia, such as sleepiness, dizziness, and confusion, are similar to symptoms of having too much alcohol. Do not drink alcohol if: Your health care provider tells you not to drink. You are pregnant, may be pregnant, or are planning to become pregnant. If you drink alcohol: Limit how much you have to: 0-1 drink a day for women. 0-2 drinks a day for men. Know how much alcohol is in your drink. In the [place] one drink equals one 12 oz bottle of beer (355 mL), one 5 oz glass of wine (148 mL), or one 1 oz glass of hard liquor (44  mL). Keep yourself hydrated with water, diet soda, or unsweetened iced tea. Keep in mind that regular soda, juice, and other mixers may contain a lot of sugar and must be counted as carbs. What are tips for following this plan?  Reading food labels Start by checking the serving size on the Nutrition Facts label of packaged foods and drinks. The number of calories and the amount of carbs, fats, and other nutrients listed on the label are based on one serving of the item. Many items contain more than one serving per package. Check the total grams (g) of carbs in one serving. Check the number of grams of saturated fats and trans fats in one serving. Choose foods that have a low amount or none of these fats. Check the number of milligrams (mg) of salt (sodium) in one serving. Most people should limit total sodium intake to less than 2,300 mg per day. Always check the nutrition information of foods labeled as "low-fat" or "nonfat." These foods may be higher in added sugar or refined carbs and should be avoided. Talk to your dietitian to identify your daily goals for nutrients listed on the label. Shopping Avoid buying canned, pre-made, or processed foods. These foods tend to be high in fat, sodium, and added sugar. Shop around the outside edge of the grocery store. This is where you will most often find fresh fruits and vegetables, bulk  grains, fresh meats, and fresh dairy products. Cooking Use low-heat cooking methods, such as baking, instead of high-heat cooking methods, such as deep frying. Cook using healthy oils, such as olive, canola, or sunflower oil. Avoid cooking with butter, cream, or high-fat meats. Meal planning Eat meals and snacks regularly, preferably at the same times every day. Avoid going long periods of time without eating. Eat foods that are high in fiber, such as fresh fruits, vegetables, beans, and whole grains. Eat 4-6 oz (112-168 g) of lean protein each day, such as lean meat,  chicken, fish, eggs, or tofu. One ounce (oz) (28 g) of lean protein is equal to: 1 oz (28 g) of meat, chicken, or fish. 1 egg.  cup (62 g) of tofu. Eat some foods each day that contain healthy fats, such as avocado, nuts, seeds, and fish. What foods should I eat? Fruits Berries. Apples. Oranges. Peaches. Apricots. Plums. Grapes. Mangoes. Papayas. Pomegranates. Kiwi. Cherries. Vegetables Leafy greens, including lettuce, spinach, kale, chard, collard greens, mustard greens, and cabbage. Beets. Cauliflower. Broccoli. Carrots. Green beans. Tomatoes. Peppers. Onions. Cucumbers. Brussels sprouts. Grains Whole grains, such as whole-wheat or whole-grain bread, crackers, tortillas, cereal, and pasta. Unsweetened oatmeal. Quinoa. Brown or wild rice. Meats and other proteins Seafood. Poultry without skin. Lean cuts of poultry and beef. Tofu. Nuts. Seeds. Dairy Low-fat or fat-free dairy products such as milk, yogurt, and cheese. The items listed above may not be a complete list of foods and beverages you can eat and drink. Contact a dietitian for more information. What foods should I avoid? Fruits Fruits canned with syrup. Vegetables Canned vegetables. Frozen vegetables with butter or cream sauce. Grains Refined white flour and flour products such as bread, pasta, snack foods, and cereals. Avoid all processed foods. Meats and other proteins Fatty cuts of meat. Poultry with skin. Breaded or fried meats. Processed meat. Avoid saturated fats. Dairy Full-fat yogurt, cheese, or milk. Beverages Sweetened drinks, such as soda or iced tea. The items listed above may not be a complete list of foods and beverages you should avoid. Contact a dietitian for more information. Questions to ask a health care provider Do I need to meet with a certified diabetes care and education specialist? Do I need to meet with a dietitian? What number can I call if I have questions? When are the best times to check my  blood glucose? Where to find more information: American Diabetes Association: diabetes.org Academy of Nutrition and Dietetics: eatright.[employer] of Diabetes and Digestive and Kidney Diseases: [email or web address].si Association of Diabetes Care & Education Specialists: diabeteseducator.org Summary It is important to have healthy eating habits because your blood sugar (glucose) levels are greatly affected by what you eat and drink. It is important to use alcohol carefully. A healthy meal plan will help you manage your blood glucose and lower your risk of heart disease. Your health care provider may recommend that you work with a dietitian to make a meal plan that is best for you. This information is not intended to replace advice given to you by your health care provider. Make sure you discuss any questions you have with your health care provider. Document Revised: 10/28/2019 Document Reviewed: 10/28/2019 Elsevier Patient Education  2024 Elsevier Inc.     Edwina Barth, MD [facility]

## 2022-12-19 NOTE — Assessment & Plan Note (Signed)
Well-controlled hypertension Continue Tenoretic 50-25 mg daily Uncontrolled diabetes with hemoglobin A1c at 8.3. Only taking metformin Unable to afford SGLT's or GLP-1 agonists Recommend to continue metformin and start glipizide 5 mg twice a day. Cardiovascular risks associated with uncontrolled diabetes discussed Diet and nutrition discussed. Follow-up in 3 months.

## 2022-12-20 ENCOUNTER — Other Ambulatory Visit: Payer: Self-pay | Admitting: Emergency Medicine

## 2022-12-20 DIAGNOSIS — N1831 Chronic kidney disease, stage 3a: Secondary | ICD-10-CM | POA: Insufficient documentation

## 2022-12-20 DIAGNOSIS — I152 Hypertension secondary to endocrine disorders: Secondary | ICD-10-CM

## 2022-12-20 MED ORDER — EMPAGLIFLOZIN 10 MG PO TABS
10.0000 mg | ORAL_TABLET | Freq: Every day | ORAL | 3 refills | Status: DC
Start: 2022-12-20 — End: 2023-10-02

## 2023-01-27 ENCOUNTER — Encounter: Payer: Self-pay | Admitting: Emergency Medicine

## 2023-02-07 DIAGNOSIS — Z1329 Encounter for screening for other suspected endocrine disorder: Secondary | ICD-10-CM | POA: Diagnosis not present

## 2023-02-12 DIAGNOSIS — E119 Type 2 diabetes mellitus without complications: Secondary | ICD-10-CM | POA: Diagnosis not present

## 2023-02-12 DIAGNOSIS — I10 Essential (primary) hypertension: Secondary | ICD-10-CM | POA: Diagnosis not present

## 2023-03-05 ENCOUNTER — Encounter: Payer: Self-pay | Admitting: Emergency Medicine

## 2023-03-05 ENCOUNTER — Ambulatory Visit (INDEPENDENT_AMBULATORY_CARE_PROVIDER_SITE_OTHER): Payer: Medicare Other | Admitting: Emergency Medicine

## 2023-03-05 VITALS — BP 126/72 | HR 68 | Temp 97.6°F | Ht 68.0 in | Wt 232.4 lb

## 2023-03-05 DIAGNOSIS — K130 Diseases of lips: Secondary | ICD-10-CM

## 2023-03-05 DIAGNOSIS — E785 Hyperlipidemia, unspecified: Secondary | ICD-10-CM

## 2023-03-05 DIAGNOSIS — E1169 Type 2 diabetes mellitus with other specified complication: Secondary | ICD-10-CM | POA: Diagnosis not present

## 2023-03-05 DIAGNOSIS — Z7984 Long term (current) use of oral hypoglycemic drugs: Secondary | ICD-10-CM | POA: Diagnosis not present

## 2023-03-05 DIAGNOSIS — E1159 Type 2 diabetes mellitus with other circulatory complications: Secondary | ICD-10-CM

## 2023-03-05 DIAGNOSIS — I152 Hypertension secondary to endocrine disorders: Secondary | ICD-10-CM

## 2023-03-05 DIAGNOSIS — N1831 Chronic kidney disease, stage 3a: Secondary | ICD-10-CM

## 2023-03-05 LAB — POCT GLYCOSYLATED HEMOGLOBIN (HGB A1C): Hemoglobin A1C: 6.7 % — AB (ref 4.0–5.6)

## 2023-03-05 NOTE — Patient Instructions (Signed)

## 2023-03-05 NOTE — Assessment & Plan Note (Signed)
Well-controlled hypertension Continue Tenoretic 50-25 mg daily Well-controlled diabetes with hemoglobin A1c improved at 6.7 Continue metformin 500 mg twice a day and glipizide 5 mg twice a day Unable to afford SGLT's or GLP-1 agonists Cardiovascular risks associated with uncontrolled diabetes discussed Diet and nutrition discussed. Follow-up in 3 months.

## 2023-03-05 NOTE — Assessment & Plan Note (Signed)
Secondary to frequent ingestion of spicy fries lately No complications. Advised to continue using Vaseline lip ointment and stop eating spicy fries

## 2023-03-05 NOTE — Assessment & Plan Note (Signed)
Chronic well-controlled stable conditions Hemoglobin A1c at 6.7  Continue metformin 500 mg twice a day and glipizide 5 mg twice a day Continue rosuvastatin 20 mg daily Diet and nutrition discussed

## 2023-03-05 NOTE — Assessment & Plan Note (Signed)
Advised to stay well-hydrated and avoid NSAIDs Unable to afford SGLT inhibitors

## 2023-03-05 NOTE — Progress Notes (Signed)
Azucena Kuba 67 y.o.   Chief Complaint  Patient presents with   Oral Pain    Patient states his lips and corners of his mouth crack, and he feels like there is some kind of film on the inside of his mouth. Started a couple of week ago     HISTORY OF PRESENT ILLNESS: This is a 67 y.o. male with history of diabetes and hypertension complaining of lower lip cracks for the past couple days Has been eating hot fries lately.  Most likely source of problem No other complaints or medical concerns today.    Oral Pain  Pertinent negatives include no fever.     Prior to Admission medications   Medication Sig Start Date End Date Taking? Authorizing Provider  Ascorbic Acid (VITAMIN C PO) Take by mouth.   Yes [provider]  atenolol-chlorthalidone (TENORETIC) 50-25 MG tablet TAKE 1 TABLET BY MOUTH DAILY 05/28/22 05/23/23 Yes Brittanee Ghazarian, Eilleen Kempf, MD  cetirizine (ZYRTEC) 10 MG tablet Take 1 tablet (10 mg total) by mouth daily. 05/10/17  Yes Linus Mako B, NP  cholecalciferol (VITAMIN D) 1000 units tablet Take 1,000 Units by mouth daily.   Yes [provider]  cyclobenzaprine (FLEXERIL) 10 MG tablet Take 1 tablet (10 mg total) by mouth 3 (three) times daily as needed for muscle spasms. 12/04/21  Yes Fayrene Helper, PA-C  empagliflozin (JARDIANCE) 10 MG TABS tablet Take 1 tablet (10 mg total) by mouth daily. 12/20/22  Yes Orlandus Borowski, Eilleen Kempf, MD  glipiZIDE (GLUCOTROL) 5 MG tablet Take 1 tablet (5 mg total) by mouth 2 (two) times daily before a meal. 12/19/22  Yes Tmya Wigington, Eilleen Kempf, MD  ibuprofen (ADVIL) 600 MG tablet Take 1 tablet (600 mg total) by mouth every 6 (six) hours as needed. 12/04/21  Yes Fayrene Helper, PA-C  metFORMIN (GLUCOPHAGE) 500 MG tablet Take 1 tablet (500 mg total) by mouth 2 (two) times daily with a meal. 12/19/22  Yes Arvis Zwahlen, Eilleen Kempf, MD  Multiple Vitamins-Minerals (ZINC PO) Take by mouth.   Yes [provider]  Omega-3 Fatty Acids (FISH OIL)  1000 MG CAPS Take by mouth.   Yes [provider]  rosuvastatin (CRESTOR) 20 MG tablet Take 1 tablet (20 mg total) by mouth daily. 12/19/22  Yes Asianae Minkler, Eilleen Kempf, MD  sildenafil (VIAGRA) 100 MG tablet Take 0.5-1 tablets (50-100 mg total) by mouth daily as needed for erectile dysfunction. 10/04/20  Yes Christl Fessenden, Eilleen Kempf, MD  Turmeric POWD by Does not apply route daily.   Yes [provider]    Allergies  Allergen Reactions   Ace Inhibitors Swelling    Suspected angioedema   Penicillins Hives, Rash and Other (See Comments)    Throat swells   Latex Hives, Swelling and Rash    Patient Active Problem List   Diagnosis Date Noted   Stage 3a chronic kidney disease (HCC) 12/20/2022   Lower urinary tract symptoms (LUTS) 06/26/2022   Lumbar degenerative disc disease 03/16/2021   Erectile dysfunction 10/04/2020   Dyslipidemia associated with type 2 diabetes mellitus (HCC) 08/06/2018   Dyslipidemia 08/06/2018   Hypertension associated with type 2 diabetes mellitus (HCC) 07/18/2011    Past Medical History:  Diagnosis Date   Allergy    Anal fissure    COPD (chronic obstructive pulmonary disease) (HCC)    Albuterol PRN.   Hydrocele    Hyperlipidemia    Hypertension    IBS (irritable bowel syndrome) 05/22/13   per chart note-pt denied  Past Surgical History:  Procedure Laterality Date   HERNIA REPAIR  prior to 2013   HYDROCELE EXCISION Bilateral 11/23/2013   Procedure: HYDROCELECTOMY ADULT;  Surgeon: Valetta Fuller, MD;  Location: [facility];  Service: Urology;  Laterality: Bilateral;   VASECTOMY  prior to 2013    Social History   Socioeconomic History   Marital status: Married    Spouse name: Not on file   Number of children: Not on file   Years of education: Not on file   Highest education level: Associate degree: occupational, Scientist, product/process development, or vocational program  Occupational History   Not on file  Tobacco Use   Smoking status: Former     Types: Cigarettes   Smokeless tobacco: Never  Vaping Use   Vaping status: Never Used  Substance and Sexual Activity   Alcohol use: Yes    Comment: rarely   Drug use: No   Sexual activity: Not on file  Other Topics Concern   Not on file  Social History Narrative   Marital status: married      Employment: [employer]; drives all over.  Owns company.      Tobacco:  Quit smoking in 2005; smoked 25 years      Alcohol:  Rare beer      Drugs:  None      Exercise:  None   Daily caffeine    Social Determinants of Health   Financial Resource Strain: Medium Risk (12/18/2022)   Overall Financial Resource Strain (CARDIA)    Difficulty of Paying Living Expenses: Somewhat hard  Food Insecurity: No Food Insecurity (12/18/2022)   Hunger Vital Sign    Worried About Running Out of Food in the Last Year: Never true    Ran Out of Food in the Last Year: Never true  Transportation Needs: No Transportation Needs (12/18/2022)   PRAPARE - Administrator, Civil Service (Medical): No    Lack of Transportation (Non-Medical): No  Physical Activity: Sufficiently Active (12/18/2022)   Exercise Vital Sign    Days of Exercise per Week: 4 days    Minutes of Exercise per Session: 60 min  Stress: Stress Concern Present (12/18/2022)   [employer] of Occupational Health - Occupational Stress Questionnaire    Feeling of Stress : Very much  Social Connections: Socially Integrated (12/18/2022)   Social Connection and Isolation Panel [NHANES]    Frequency of Communication with Friends and Family: More than three times a week    Frequency of Social Gatherings with Friends and Family: Once a week    Attends Religious Services: More than 4 times per year    Active Member of [employer] or Organizations: Yes    Attends Engineer, structural: More than 4 times per year    Marital Status: Married  Catering manager Violence: Unknown (12/28/2021)   Received from [employer], [facility]    HITS    Physically Hurt: Not on file    Insult or Talk Down To: Not on file    Threaten Physical Harm: Not on file    Scream or Curse: Not on file    Family History  Problem Relation Age of Onset   Diabetes Father    Hypertension Father    Diabetes Brother    Hypertension Brother    Diabetes Brother    Diabetes Brother      Review of Systems  Constitutional: Negative.  Negative for chills and fever.  HENT: Negative.  Negative for  congestion and sore throat.   Respiratory: Negative.  Negative for cough and shortness of breath.   Cardiovascular: Negative.  Negative for chest pain and palpitations.  Gastrointestinal:  Negative for abdominal pain, diarrhea, nausea and vomiting.  Genitourinary: Negative.  Negative for dysuria and hematuria.  Skin: Negative.  Negative for rash.  Neurological: Negative.  Negative for dizziness and headaches.  All other systems reviewed and are negative.   Today's Vitals   03/05/23 1303  BP: 126/72  Pulse: 68  Temp: 97.6 F (36.4 C)  TempSrc: Oral  SpO2: 96%  Weight: 232 lb 6.4 oz (105.4 kg)  Height: 5\' 8"  (1.727 m)   Body mass index is 35.34 kg/m.   Physical Exam Vitals reviewed.  Constitutional:      Appearance: Normal appearance.  HENT:     Head: Normocephalic.     Mouth/Throat:     Comments: Cracked lower lip Eyes:     Extraocular Movements: Extraocular movements intact.     Pupils: Pupils are equal, round, and reactive to light.  Cardiovascular:     Rate and Rhythm: Normal rate and regular rhythm.     Pulses: Normal pulses.     Heart sounds: Normal heart sounds.  Pulmonary:     Effort: Pulmonary effort is normal.     Breath sounds: Normal breath sounds.  Skin:    General: Skin is warm and dry.  Neurological:     Mental Status: He is alert and oriented to person, place, and time.  Psychiatric:        Mood and Affect: Mood normal.        Behavior: Behavior normal.    Results for orders placed or performed in visit on  03/05/23 (from the past 24 hour(s))  POCT HgB A1C     Status: Abnormal   Collection Time: 03/05/23  1:33 PM  Result Value Ref Range   Hemoglobin A1C 6.7 (A) 4.0 - 5.6 %   HbA1c POC (<> result, manual entry)     HbA1c, POC (prediabetic range)     HbA1c, POC (controlled diabetic range)       ASSESSMENT & PLAN: A total of 45 minutes was spent with the patient and counseling/coordination of care regarding preparing for this visit, review of most recent office visit notes, review of multiple chronic medical conditions and their management, review of all medications, review of most recent bloodwork results including interpretation of today's hemoglobin A1c, review of health maintenance items, education on nutrition, prognosis, documentation, and need for follow up. .  Problem List Items Addressed This Visit       Cardiovascular and Mediastinum   Hypertension associated with type 2 diabetes mellitus (HCC)    Well-controlled hypertension Continue Tenoretic 50-25 mg daily Well-controlled diabetes with hemoglobin A1c improved at 6.7 Continue metformin 500 mg twice a day and glipizide 5 mg twice a day Unable to afford SGLT's or GLP-1 agonists Cardiovascular risks associated with uncontrolled diabetes discussed Diet and nutrition discussed. Follow-up in 3 months.      Relevant Orders   POCT HgB A1C (Completed)     Digestive   Cheilitis - Primary    Secondary to frequent ingestion of spicy fries lately No complications. Advised to continue using Vaseline lip ointment and stop eating spicy fries        Endocrine   Dyslipidemia associated with type 2 diabetes mellitus (HCC)    Chronic well-controlled stable conditions Hemoglobin A1c at 6.7  Continue metformin 500 mg twice a day  and glipizide 5 mg twice a day Continue rosuvastatin 20 mg daily Diet and nutrition discussed         Genitourinary   Stage 3a chronic kidney disease (HCC)    Advised to stay well-hydrated and avoid  NSAIDs Unable to afford SGLT inhibitors       Cracked lips, also known as chapped lips or cheilitis, can occur due to a number of possible reasons, including: Environmental conditions: Cold, dry weather, wind, and sun exposure can all cause chapped lips.  Dehydration: Not drinking enough water can lead to chapped lips.  Licking your lips: Saliva can irritate the lips and remove natural moisture.  Medications: Some medications, such as retinoids, high doses of vitamin A, and lithium, can cause chapped lips.  Skin disorders: Sjogren's syndrome, diabetes, and sexually transmitted infections (STIs) can cause chronic dry lips.  Other habits: Smoking cigarettes, chewing tobacco, eating spicy food, and poorly fitted dental appliances can all contribute to chapped lips.  Products: Certain cosmetics, lip balms, toothpastes, and sunscreens can cause a reaction.   Patient Instructions  Diabetes Mellitus and Nutrition, Adult When you have diabetes, or diabetes mellitus, it is very important to have healthy eating habits because your blood sugar (glucose) levels are greatly affected by what you eat and drink. Eating healthy foods in the right amounts, at about the same times every day, can help you: Manage your blood glucose. Lower your risk of heart disease. Improve your blood pressure. Reach or maintain a healthy weight. What can affect my meal plan? Every person with diabetes is different, and each person has different needs for a meal plan. Your health care provider may recommend that you work with a dietitian to make a meal plan that is best for you. Your meal plan may vary depending on factors such as: The calories you need. The medicines you take. Your weight. Your blood glucose, blood pressure, and cholesterol levels. Your activity level. Other health conditions you have, such as heart or kidney disease. How do carbohydrates affect me? Carbohydrates, also called carbs, affect your blood  glucose level more than any other type of food. Eating carbs raises the amount of glucose in your blood. It is important to know how many carbs you can safely have in each meal. This is different for every person. Your dietitian can help you calculate how many carbs you should have at each meal and for each snack. How does alcohol affect me? Alcohol can cause a decrease in blood glucose (hypoglycemia), especially if you use insulin or take certain diabetes medicines by mouth. Hypoglycemia can be a life-threatening condition. Symptoms of hypoglycemia, such as sleepiness, dizziness, and confusion, are similar to symptoms of having too much alcohol. Do not drink alcohol if: Your health care provider tells you not to drink. You are pregnant, may be pregnant, or are planning to become pregnant. If you drink alcohol: Limit how much you have to: 0-1 drink a day for women. 0-2 drinks a day for men. Know how much alcohol is in your drink. In the [place] one drink equals one 12 oz bottle of beer (355 mL), one 5 oz glass of wine (148 mL), or one 1 oz glass of hard liquor (44 mL). Keep yourself hydrated with water, diet soda, or unsweetened iced tea. Keep in mind that regular soda, juice, and other mixers may contain a lot of sugar and must be counted as carbs. What are tips for following this plan?  Reading food labels Start by  checking the serving size on the Nutrition Facts label of packaged foods and drinks. The number of calories and the amount of carbs, fats, and other nutrients listed on the label are based on one serving of the item. Many items contain more than one serving per package. Check the total grams (g) of carbs in one serving. Check the number of grams of saturated fats and trans fats in one serving. Choose foods that have a low amount or none of these fats. Check the number of milligrams (mg) of salt (sodium) in one serving. Most people should limit total sodium intake to less than 2,300 mg  per day. Always check the nutrition information of foods labeled as "low-fat" or "nonfat." These foods may be higher in added sugar or refined carbs and should be avoided. Talk to your dietitian to identify your daily goals for nutrients listed on the label. Shopping Avoid buying canned, pre-made, or processed foods. These foods tend to be high in fat, sodium, and added sugar. Shop around the outside edge of the grocery store. This is where you will most often find fresh fruits and vegetables, bulk grains, fresh meats, and fresh dairy products. Cooking Use low-heat cooking methods, such as baking, instead of high-heat cooking methods, such as deep frying. Cook using healthy oils, such as olive, canola, or sunflower oil. Avoid cooking with butter, cream, or high-fat meats. Meal planning Eat meals and snacks regularly, preferably at the same times every day. Avoid going long periods of time without eating. Eat foods that are high in fiber, such as fresh fruits, vegetables, beans, and whole grains. Eat 4-6 oz (112-168 g) of lean protein each day, such as lean meat, chicken, fish, eggs, or tofu. One ounce (oz) (28 g) of lean protein is equal to: 1 oz (28 g) of meat, chicken, or fish. 1 egg.  cup (62 g) of tofu. Eat some foods each day that contain healthy fats, such as avocado, nuts, seeds, and fish. What foods should I eat? Fruits Berries. Apples. Oranges. Peaches. Apricots. Plums. Grapes. Mangoes. Papayas. Pomegranates. Kiwi. Cherries. Vegetables Leafy greens, including lettuce, spinach, kale, chard, collard greens, mustard greens, and cabbage. Beets. Cauliflower. Broccoli. Carrots. Green beans. Tomatoes. Peppers. Onions. Cucumbers. Brussels sprouts. Grains Whole grains, such as whole-wheat or whole-grain bread, crackers, tortillas, cereal, and pasta. Unsweetened oatmeal. Quinoa. Brown or wild rice. Meats and other proteins Seafood. Poultry without skin. Lean cuts of poultry and beef. Tofu.  Nuts. Seeds. Dairy Low-fat or fat-free dairy products such as milk, yogurt, and cheese. The items listed above may not be a complete list of foods and beverages you can eat and drink. Contact a dietitian for more information. What foods should I avoid? Fruits Fruits canned with syrup. Vegetables Canned vegetables. Frozen vegetables with butter or cream sauce. Grains Refined white flour and flour products such as bread, pasta, snack foods, and cereals. Avoid all processed foods. Meats and other proteins Fatty cuts of meat. Poultry with skin. Breaded or fried meats. Processed meat. Avoid saturated fats. Dairy Full-fat yogurt, cheese, or milk. Beverages Sweetened drinks, such as soda or iced tea. The items listed above may not be a complete list of foods and beverages you should avoid. Contact a dietitian for more information. Questions to ask a health care provider Do I need to meet with a certified diabetes care and education specialist? Do I need to meet with a dietitian? What number can I call if I have questions? When are the best times to check  my blood glucose? Where to find more information: American Diabetes Association: diabetes.org Academy of Nutrition and Dietetics: eatright.[employer] of Diabetes and Digestive and Kidney Diseases: StageSync.si Association of Diabetes Care & Education Specialists: diabeteseducator.org Summary It is important to have healthy eating habits because your blood sugar (glucose) levels are greatly affected by what you eat and drink. It is important to use alcohol carefully. A healthy meal plan will help you manage your blood glucose and lower your risk of heart disease. Your health care provider may recommend that you work with a dietitian to make a meal plan that is best for you. This information is not intended to replace advice given to you by your health care provider. Make sure you discuss any questions you have with your health  care provider. Document Revised: 10/28/2019 Document Reviewed: 10/28/2019 Elsevier Patient Education  2024 Elsevier Inc.    Edwina Barth, MD [facility]

## 2023-03-15 DIAGNOSIS — M2550 Pain in unspecified joint: Secondary | ICD-10-CM | POA: Diagnosis not present

## 2023-03-20 ENCOUNTER — Ambulatory Visit: Payer: Medicare Other | Admitting: Emergency Medicine

## 2023-04-11 ENCOUNTER — Other Ambulatory Visit: Payer: Self-pay | Admitting: Emergency Medicine

## 2023-04-11 DIAGNOSIS — I10 Essential (primary) hypertension: Secondary | ICD-10-CM

## 2023-05-06 ENCOUNTER — Ambulatory Visit (INDEPENDENT_AMBULATORY_CARE_PROVIDER_SITE_OTHER): Payer: Medicare Other | Admitting: Emergency Medicine

## 2023-05-06 ENCOUNTER — Ambulatory Visit: Payer: Self-pay | Admitting: Emergency Medicine

## 2023-05-06 ENCOUNTER — Encounter: Payer: Self-pay | Admitting: Emergency Medicine

## 2023-05-06 VITALS — BP 128/78 | HR 64 | Temp 97.6°F | Ht 68.0 in | Wt 238.0 lb

## 2023-05-06 DIAGNOSIS — R29818 Other symptoms and signs involving the nervous system: Secondary | ICD-10-CM | POA: Diagnosis not present

## 2023-05-06 DIAGNOSIS — R21 Rash and other nonspecific skin eruption: Secondary | ICD-10-CM | POA: Insufficient documentation

## 2023-05-06 MED ORDER — CHLORHEXIDINE GLUCONATE 0.12 % MT SOLN: 15.0000 mL | Freq: Two times a day (BID) | OROMUCOSAL | 3 refills | Status: DC

## 2023-05-06 NOTE — Progress Notes (Signed)
Larry Dawson 68 y.o.   Chief Complaint  Patient presents with   Rash    Patient states he has a worsen has along his both thighs that's been there for about 2 months and has a few on his back. It is itchy, burning, raised. Patient did mention starting testosterone injections about 2 months ago and wonder if its from that     HISTORY OF PRESENT ILLNESS: This is a 68 y.o. male complaining of rash to lateral sides of both upper legs that started 2 to 3 weeks ago. Patient started taking testosterone injections 2 months ago.  Had no problems the first month Thinks it is secondary to testosterone injections Denies difficulty breathing or trouble swallowing.  Localized rash.  No other associated symptoms No other complaints or medical concerns today. 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  Rash Pertinent negatives include no congestion, cough, fever, shortness of breath, sore throat or vomiting.     Prior to Admission medications   Medication Sig Start Date End Date Taking? Authorizing Provider  Ascorbic Acid (VITAMIN C PO) Take by mouth.   Yes [provider]  atenolol-chlorthalidone (TENORETIC) 50-25 MG tablet TAKE 1 TABLET BY MOUTH DAILY 04/11/23 04/05/24 Yes Alyssamarie Mounsey, Eilleen Kempf, MD  cetirizine (ZYRTEC) 10 MG tablet Take 1 tablet (10 mg total) by mouth daily. 05/10/17  Yes Linus Mako B, NP  cholecalciferol (VITAMIN D) 1000 units tablet Take 1,000 Units by mouth daily.   Yes [provider]  cyclobenzaprine (FLEXERIL) 10 MG tablet Take 1 tablet (10 mg total) by mouth 3 (three) times daily as needed for muscle spasms. 12/04/21  Yes Fayrene Helper, PA-C  empagliflozin (JARDIANCE) 10 MG TABS tablet Take 1 tablet (10 mg total) by mouth daily. 12/20/22  Yes Sonakshi Rolland, Eilleen Kempf, MD  glipiZIDE (GLUCOTROL) 5 MG tablet Take 1 tablet (5 mg total) by mouth 2 (two) times daily before a meal. 12/19/22  Yes Donnis Pecha, Eilleen Kempf, MD  ibuprofen  (ADVIL) 600 MG tablet Take 1 tablet (600 mg total) by mouth every 6 (six) hours as needed. 12/04/21  Yes Fayrene Helper, PA-C  metFORMIN (GLUCOPHAGE) 500 MG tablet Take 1 tablet (500 mg total) by mouth 2 (two) times daily with a meal. 12/19/22  Yes Lailany Enoch, Eilleen Kempf, MD  Multiple Vitamins-Minerals (ZINC PO) Take by mouth.   Yes [provider]  Omega-3 Fatty Acids (FISH OIL) 1000 MG CAPS Take by mouth.   Yes [provider]  rosuvastatin (CRESTOR) 20 MG tablet Take 1 tablet (20 mg total) by mouth daily. 12/19/22  Yes Lahela Woodin, Eilleen Kempf, MD  sildenafil (VIAGRA) 100 MG tablet Take 0.5-1 tablets (50-100 mg total) by mouth daily as needed for erectile dysfunction. 10/04/20  Yes Cornisha Zetino, Eilleen Kempf, MD  Turmeric POWD by Does not apply route daily.   Yes [provider]    Allergies  Allergen Reactions   Ace Inhibitors Swelling    Suspected angioedema   Penicillins Hives, Rash and Other (See Comments)    Throat swells   Latex Hives, Swelling and Rash    Patient Active Problem List   Diagnosis Date Noted   Cheilitis 03/05/2023   Stage 3a chronic kidney disease (HCC) 12/20/2022   Lower urinary tract symptoms (LUTS) 06/26/2022   Lumbar degenerative disc disease 03/16/2021   Erectile dysfunction 10/04/2020   Dyslipidemia associated with type 2 diabetes mellitus (HCC) 08/06/2018   Dyslipidemia 08/06/2018   Hypertension associated with type 2 diabetes  mellitus (HCC) 07/18/2011    Past Medical History:  Diagnosis Date   Allergy    Anal fissure    COPD (chronic obstructive pulmonary disease) (HCC)    Albuterol PRN.   Hydrocele    Hyperlipidemia    Hypertension    IBS (irritable bowel syndrome) 05/22/13   per chart note-pt denied    Past Surgical History:  Procedure Laterality Date   HERNIA REPAIR  prior to 2013   HYDROCELE EXCISION Bilateral 11/23/2013   Procedure: HYDROCELECTOMY ADULT;  Surgeon: Valetta Fuller, MD;  Location: [facility];   Service: Urology;  Laterality: Bilateral;   VASECTOMY  prior to 2013    Social History   Socioeconomic History   Marital status: Married    Spouse name: Not on file   Number of children: Not on file   Years of education: Not on file   Highest education level: Associate degree: occupational, Scientist, product/process development, or vocational program  Occupational History   Not on file  Tobacco Use   Smoking status: Former    Types: Cigarettes   Smokeless tobacco: Never  Vaping Use   Vaping status: Never Used  Substance and Sexual Activity   Alcohol use: Yes    Comment: rarely   Drug use: No   Sexual activity: Not on file  Other Topics Concern   Not on file  Social History Narrative   Marital status: married      Employment: [employer]; drives all over.  Owns company.      Tobacco:  Quit smoking in 2005; smoked 25 years      Alcohol:  Rare beer      Drugs:  None      Exercise:  None   Daily caffeine    Social Drivers of Health   Financial Resource Strain: Medium Risk (12/18/2022)   Overall Financial Resource Strain (CARDIA)    Difficulty of Paying Living Expenses: Somewhat hard  Food Insecurity: No Food Insecurity (12/18/2022)   Hunger Vital Sign    Worried About Running Out of Food in the Last Year: Never true    Ran Out of Food in the Last Year: Never true  Transportation Needs: No Transportation Needs (12/18/2022)   PRAPARE - Administrator, Civil Service (Medical): No    Lack of Transportation (Non-Medical): No  Physical Activity: Sufficiently Active (12/18/2022)   Exercise Vital Sign    Days of Exercise per Week: 4 days    Minutes of Exercise per Session: 60 min  Stress: Stress Concern Present (12/18/2022)   [employer] of Occupational Health - Occupational Stress Questionnaire    Feeling of Stress : Very much  Social Connections: Socially Integrated (12/18/2022)   Social Connection and Isolation Panel [NHANES]    Frequency of Communication with Friends and  Family: More than three times a week    Frequency of Social Gatherings with Friends and Family: Once a week    Attends Religious Services: More than 4 times per year    Active Member of [employer] or Organizations: Yes    Attends Banker Meetings: More than 4 times per year    Marital Status: Married  Catering manager Violence: Unknown (12/28/2021)   Received from [employer], [facility]   HITS    Physically Hurt: Not on file    Insult or Talk Down To: Not on file    Threaten Physical Harm: Not on file    Scream or Curse: Not on file  Family History  Problem Relation Age of Onset   Diabetes Father    Hypertension Father    Diabetes Brother    Hypertension Brother    Diabetes Brother    Diabetes Brother      Review of Systems  Constitutional: Negative.  Negative for fever.  HENT: Negative.  Negative for congestion and sore throat.   Respiratory: Negative.  Negative for cough and shortness of breath.   Cardiovascular: Negative.  Negative for chest pain and palpitations.  Gastrointestinal:  Negative for abdominal pain, nausea and vomiting.  Genitourinary: Negative.  Negative for dysuria and hematuria.  Skin:  Positive for rash.  Neurological: Negative.  Negative for dizziness and headaches.  All other systems reviewed and are negative.   Today's Vitals   05/06/23 1605  BP: 128/78  Pulse: 64  Temp: 97.6 F (36.4 C)  TempSrc: Oral  SpO2: 96%  Weight: 238 lb (108 kg)  Height: 5\' 8"  (1.727 m)   Body mass index is 36.19 kg/m.   Physical Exam Constitutional:      Appearance: Normal appearance.  HENT:     Head: Normocephalic.  Eyes:     Extraocular Movements: Extraocular movements intact.  Cardiovascular:     Rate and Rhythm: Normal rate.  Pulmonary:     Effort: Pulmonary effort is normal.  Skin:    General: Skin is warm and dry.     Findings: Rash present.     Comments: Multiple small hyperpigmented round lesions scattered throughout lateral  aspects of both upper legs  Neurological:     Mental Status: He is alert and oriented to person, place, and time.  Psychiatric:        Mood and Affect: Mood normal.        Behavior: Behavior normal.      ASSESSMENT & PLAN: A total of 33 minutes was spent with the patient and counseling/coordination of care regarding preparing for this visit, review of most recent office visit notes, differential diagnosis of nonspecific rash, review of all medications and changes made, prognosis, documentation, and need for follow-up  Problem List Items Addressed This Visit       Musculoskeletal and Integument   Rash and nonspecific skin eruption - Primary   Clinically stable.  No red flag signs or symptoms. Localized rash without associated symptoms. Differential diagnosis discussed. May be related to recent testosterone injections. No signs of anaphylaxis. Advised to stop testosterone injections for 2 to 4 weeks and monitor rash.  Advised to contact the office if no better or worse during the next several days or weeks.         Other   Suspected sleep apnea   Relevant Orders   Ambulatory referral to Sleep Studies   Patient Instructions  Rash, Adult  A rash is a breakout of spots or blotches on the skin. It can change the way your skin looks and feels. Many things can cause a rash. The goal of treatment is to stop the itching and keep the rash from spreading. Follow these instructions at home: Medicine Take or apply over-the-counter and prescription medicines only as told by your doctor. These may include medicines to treat: Red or swollen skin. Itching. An allergy. Pain. An infection.  Skin care Put a cool, wet cloth on the rash. Do not scratch or rub your skin. Try not to cover the rash. Keep it exposed to air as often as you can. Managing itching and discomfort Avoid hot showers or baths. These can  make itching worse. A cold shower may help. Try taking a bath with: Epsom  salts. You can get these at your pharmacy or grocery store. Follow the instructions on the package. Baking soda. Pour a small amount into the bath as told by your doctor. Colloidal oatmeal. You can get this at your pharmacy or grocery store. Follow the instructions on the package. Try putting baking soda paste on your skin. Stir water into baking soda until it gets like a paste. Try putting on a lotion to help with itching (calamine lotion). Keep cool. Stay out of the sun. Sweating and being hot can make itching worse. General instructions  Rest as needed. Drink enough fluid to keep your pee (urine) pale yellow. Wear loose-fitting clothes. Avoid scented soaps, detergents, and perfumes. Use gentle soaps, detergents, perfumes, and cosmetics. Avoid the things that cause your rash. Keep a journal to help keep track of what causes your rash. Write down: What you eat. What cosmetics you use. What you drink. What you wear. This includes jewelry. Contact a doctor if: You sweat a lot at night. You pee (urinate) more or less than normal. Your pee is a darker color than normal. Your eyes are sensitive to light. Your skin or the white parts of your eyes turn yellow. Your skin tingles or is numb. You get painful blisters in your nose or mouth. Your rash does not go away after a few days, or it gets worse. You are more tired than normal. You are more thirsty than normal. You have new or worse symptoms. These may include: Pain in your belly. A fever. Watery poop (diarrhea). Vomiting. Weakness. Weight loss. Get help right away if: You start to feel mixed up (confused). You have a very bad headache or a stiff neck. You have very bad joint pain or stiffness. You get very sleepy or not responsive. You have a seizure. This information is not intended to replace advice given to you by your health care provider. Make sure you discuss any questions you have with your health care provider. Document  Revised: 01/12/2022 Document Reviewed: 01/12/2022 Elsevier Patient Education  2024 Elsevier Inc.   Edwina Barth, MD [facility]

## 2023-05-06 NOTE — Telephone Encounter (Signed)
  Chief Complaint: rash Symptoms: raised rash on both thighs Frequency: 1 month Pertinent Negatives: Patient denies SOB, fever Disposition: [] ED /[] Urgent Care (no appt availability in office) / [x] Appointment(In office/virtual)/ []  [facility]/ [] Home Care/ [] Refused Recommended Disposition /[] [facility]/ []  Follow-up with PCP Additional Notes: Patient called stating he has a raised rash on both thighs x 1 month. Patient is concerned it is related to his testosterone injections he has been receiving. Patient reports he has also had localized swelling and warmth at injection sites. Patient states the rash is constant, sometimes red. Per protocol, patient to be evaluated within 24 hours. Patient scheduled with PCP for today at 1600. Care advice reviewed, patient verbalized understanding. 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 Alerting PCP for review.   Reason for Disposition  Looks like a boil, infected sore, deep ulcer or other infected rash  Answer Assessment - Initial Assessment Questions 1. APPEARANCE of RASH: "Describe the rash."      Red at times, always raised, covers the entire upper thigh on side and top 2. LOCATION: "Where is the rash located?"      Both legs upper thigh 3. NUMBER: "How many spots are there?"      multiple 4. SIZE: "How big are the spots?" (Inches, centimeters or compare to size of a coin)      Pinpoint spots, but cover the entire thigh on both legs 5. ONSET: "When did the rash start?"      1 month 6. ITCHING: "Does the rash itch?" If Yes, ask: "How bad is the itch?"  (Scale 0-10; or none, mild, moderate, severe)     Sometimes, moderate 7. PAIN: "Does the rash hurt?" If Yes, ask: "How bad is the pain?"  (Scale 0-10; or none, mild, moderate, severe)    - NONE (0): no pain    - MILD (1-3): doesn't interfere with normal activities     - MODERATE (4-7): interferes with normal activities or awakens from  sleep     - SEVERE (8-10): excruciating pain, unable to do any normal activities     Denies 8. OTHER SYMPTOMS: "Do you have any other symptoms?" (e.g., fever)     Denies  Protocols used: Rash or Redness - Localized-A-AH

## 2023-05-06 NOTE — Assessment & Plan Note (Signed)
Clinically stable.  No red flag signs or symptoms. Localized rash without associated symptoms. Differential diagnosis discussed. May be related to recent testosterone injections. No signs of anaphylaxis. Advised to stop testosterone injections for 2 to 4 weeks and monitor rash.  Advised to contact the office if no better or worse during the next several days or weeks.

## 2023-05-06 NOTE — Patient Instructions (Signed)
Rash, Adult  A rash is a breakout of spots or blotches on the skin. It can change the way your skin looks and feels. Many things can cause a rash. The goal of treatment is to stop the itching and keep the rash from spreading. Follow these instructions at home: Medicine Take or apply over-the-counter and prescription medicines only as told by your doctor. These may include medicines to treat: Red or swollen skin. Itching. An allergy. Pain. An infection.  Skin care Put a cool, wet cloth on the rash. Do not scratch or rub your skin. Try not to cover the rash. Keep it exposed to air as often as you can. Managing itching and discomfort Avoid hot showers or baths. These can make itching worse. A cold shower may help. Try taking a bath with: Epsom salts. You can get these at your pharmacy or grocery store. Follow the instructions on the package. Baking soda. Pour a small amount into the bath as told by your doctor. Colloidal oatmeal. You can get this at your pharmacy or grocery store. Follow the instructions on the package. Try putting baking soda paste on your skin. Stir water into baking soda until it gets like a paste. Try putting on a lotion to help with itching (calamine lotion). Keep cool. Stay out of the sun. Sweating and being hot can make itching worse. General instructions  Rest as needed. Drink enough fluid to keep your pee (urine) pale yellow. Wear loose-fitting clothes. Avoid scented soaps, detergents, and perfumes. Use gentle soaps, detergents, perfumes, and cosmetics. Avoid the things that cause your rash. Keep a journal to help keep track of what causes your rash. Write down: What you eat. What cosmetics you use. What you drink. What you wear. This includes jewelry. Contact a doctor if: You sweat a lot at night. You pee (urinate) more or less than normal. Your pee is a darker color than normal. Your eyes are sensitive to light. Your skin or the white parts of your  eyes turn yellow. Your skin tingles or is numb. You get painful blisters in your nose or mouth. Your rash does not go away after a few days, or it gets worse. You are more tired than normal. You are more thirsty than normal. You have new or worse symptoms. These may include: Pain in your belly. A fever. Watery poop (diarrhea). Vomiting. Weakness. Weight loss. Get help right away if: You start to feel mixed up (confused). You have a very bad headache or a stiff neck. You have very bad joint pain or stiffness. You get very sleepy or not responsive. You have a seizure. This information is not intended to replace advice given to you by your health care provider. Make sure you discuss any questions you have with your health care provider. Document Revised: 01/12/2022 Document Reviewed: 01/12/2022 Elsevier Patient Education  2024 [employer].

## 2023-05-11 ENCOUNTER — Other Ambulatory Visit: Payer: Self-pay | Admitting: Emergency Medicine

## 2023-05-11 DIAGNOSIS — I152 Hypertension secondary to endocrine disorders: Secondary | ICD-10-CM

## 2023-05-11 DIAGNOSIS — E1169 Type 2 diabetes mellitus with other specified complication: Secondary | ICD-10-CM

## 2023-05-14 ENCOUNTER — Encounter: Payer: Self-pay | Admitting: Emergency Medicine

## 2023-05-17 ENCOUNTER — Ambulatory Visit (INDEPENDENT_AMBULATORY_CARE_PROVIDER_SITE_OTHER): Payer: Medicare Other

## 2023-05-17 DIAGNOSIS — Z0000 Encounter for general adult medical examination without abnormal findings: Secondary | ICD-10-CM

## 2023-05-17 NOTE — Patient Instructions (Signed)
 Mr. Tingler , Thank you for taking time to come for your Medicare Wellness Visit. I appreciate your ongoing commitment to your health goals. Please review the following plan we discussed and let me know if I can assist you in the future.   Referrals/Orders/Follow-Ups/Clinician Recommendations: none  This is a list of the screening recommended for you and due dates:  Health Maintenance  Topic Date Due   Zoster (Shingles) Vaccine (1 of 2) Never done   Complete foot exam   02/23/2021   Pneumonia Vaccine (2 of 2 - PCV) 05/25/2021   COVID-19 Vaccine (5 - 2024-25 season) 12/09/2022   Colon Cancer Screening  05/23/2023   Eye exam for diabetics  06/25/2023*   Hemoglobin A1C  09/02/2023   Yearly kidney function blood test for diabetes  12/19/2023   Yearly kidney health urinalysis for diabetes  12/19/2023   Medicare Annual Wellness Visit  05/16/2024   Flu Shot  Completed   Hepatitis C Screening  Completed   HPV Vaccine  Aged Out   DTaP/Tdap/Td vaccine  Discontinued  *Topic was postponed. The date shown is not the original due date.    Advanced directives: (Copy Requested) Please bring a copy of your health care power of attorney and living will to the office to be added to your chart at your convenience.  Next Medicare Annual Wellness Visit scheduled for next year: Yes  insert Preventive Care attachment Insert FALL PREVENTION attachment if needed

## 2023-05-17 NOTE — Progress Notes (Signed)
 Subjective:   Larry Dawson is a 68 y.o. male who presents for an Initial Medicare Annual Wellness Visit.  Visit Complete: Virtual I connected with  [name] on 05/17/23 by a audio enabled telemedicine application and verified that I am speaking with the correct person using two identifiers. Interactive audio and video telecommunications were attempted between this provider and patient, however failed, due to patient having technical difficulties OR patient did not have access to video capability.  We continued and completed visit with audio only.  Patient Location: Home  Provider Location: Home Office  I discussed the limitations of evaluation and management by telemedicine. The patient expressed understanding and agreed to proceed.  Vital Signs: Because this visit was a virtual/telehealth visit, some criteria may be missing or patient reported. Any vitals not documented were not able to be obtained and vitals that have been documented are patient reported.  Patient Medicare AWV questionnaire was completed by the patient on 05/16/2023; I have confirmed that all information answered by patient is correct and no changes since this date.  Cardiac Risk Factors include: advanced age (>33men, >67 women);diabetes mellitus;dyslipidemia;hypertension;male gender     Objective:    Today's Vitals   05/17/23 0802  PainSc: 2    There is no height or weight on file to calculate BMI.     05/17/2023    8:10 AM 12/04/2021    8:37 AM 10/28/2016   12:26 PM 11/23/2013    8:59 AM  Advanced Directives  Does Patient Have a Medical Advance Directive? Yes No;Yes No No  Type of Estate Agent of [place];Living will Healthcare Power of Attorney    Copy of Healthcare Power of Attorney in Chart? No - copy requested No - copy requested, Physician notified    Would patient like information on creating a medical advance directive?    No - patient declined information    Current  Medications (verified) Outpatient Encounter Medications as of 05/17/2023  Medication Sig   Ascorbic Acid (VITAMIN C PO) Take by mouth.   atenolol -chlorthalidone  (TENORETIC ) 50-25 MG tablet TAKE 1 TABLET BY MOUTH DAILY   cetirizine  (ZYRTEC ) 10 MG tablet Take 1 tablet (10 mg total) by mouth daily.   chlorhexidine  (PERIDEX ) 0.12 % solution Use as directed 15 mLs in the mouth or throat 2 (two) times daily.   cholecalciferol (VITAMIN D) 1000 units tablet Take 1,000 Units by mouth daily.   cyclobenzaprine  (FLEXERIL ) 10 MG tablet Take 1 tablet (10 mg total) by mouth 3 (three) times daily as needed for muscle spasms.   empagliflozin  (JARDIANCE ) 10 MG TABS tablet Take 1 tablet (10 mg total) by mouth daily.   glipiZIDE  (GLUCOTROL ) 5 MG tablet TAKE 1 TABLET BY MOUTH 2 TIMES A DAY BEFORE A MEAL   ibuprofen  (ADVIL ) 600 MG tablet Take 1 tablet (600 mg total) by mouth every 6 (six) hours as needed.   metFORMIN  (GLUCOPHAGE ) 500 MG tablet Take 1 tablet (500 mg total) by mouth 2 (two) times daily with a meal.   Multiple Vitamins-Minerals (ZINC PO) Take by mouth.   Omega-3 Fatty Acids (FISH OIL) 1000 MG CAPS Take by mouth.   rosuvastatin  (CRESTOR ) 20 MG tablet Take 1 tablet (20 mg total) by mouth daily.   sildenafil  (VIAGRA ) 100 MG tablet Take 0.5-1 tablets (50-100 mg total) by mouth daily as needed for erectile dysfunction.   Turmeric POWD by Does not apply route daily.   No facility-administered encounter medications on file as of 05/17/2023.  Allergies (verified) Ace inhibitors, Penicillins, and Latex   History: Past Medical History:  Diagnosis Date   Allergy    Anal fissure    COPD (chronic obstructive pulmonary disease) (HCC)    Albuterol  PRN.   Hydrocele    Hyperlipidemia    Hypertension    IBS (irritable bowel syndrome) 05/22/13   per chart note-pt denied   Past Surgical History:  Procedure Laterality Date   HERNIA REPAIR  prior to 2013   HYDROCELE EXCISION Bilateral 11/23/2013    Procedure: HYDROCELECTOMY ADULT;  Surgeon: Alm GORMAN Fragmin, MD;  Location: [facility];  Service: Urology;  Laterality: Bilateral;   VASECTOMY  prior to 2013   Family History  Problem Relation Age of Onset   Diabetes Father    Hypertension Father    Diabetes Brother    Hypertension Brother    Diabetes Brother    Diabetes Brother    Social History   Socioeconomic History   Marital status: Married    Spouse name: Not on file   Number of children: Not on file   Years of education: Not on file   Highest education level: Some college, no degree  Occupational History   Not on file  Tobacco Use   Smoking status: Former    Types: Cigarettes   Smokeless tobacco: Never  Vaping Use   Vaping status: Never Used  Substance and Sexual Activity   Alcohol use: Not Currently    Comment: rarely   Drug use: No   Sexual activity: Not on file  Other Topics Concern   Not on file  Social History Narrative   Marital status: married      Employment: [employer]; drives all over.  Owns company.      Tobacco:  Quit smoking in 2005; smoked 25 years      Alcohol:  Rare beer      Drugs:  None      Exercise:  None   Daily caffeine    Social Drivers of Corporate Investment Banker Strain: Low Risk  (05/16/2023)   Overall Financial Resource Strain (CARDIA)    Difficulty of Paying Living Expenses: Not very hard  Food Insecurity: No Food Insecurity (05/16/2023)   Hunger Vital Sign    Worried About Running Out of Food in the Last Year: Never true    Ran Out of Food in the Last Year: Never true  Transportation Needs: No Transportation Needs (05/16/2023)   PRAPARE - Administrator, Civil Service (Medical): No    Lack of Transportation (Non-Medical): No  Physical Activity: Insufficiently Active (05/16/2023)   Exercise Vital Sign    Days of Exercise per Week: 2 days    Minutes of Exercise per Session: 40 min  Stress: Stress Concern Present (05/16/2023)   [employer] of  Occupational Health - Occupational Stress Questionnaire    Feeling of Stress : To some extent  Social Connections: Socially Integrated (05/16/2023)   Social Connection and Isolation Panel [NHANES]    Frequency of Communication with Friends and Family: More than three times a week    Frequency of Social Gatherings with Friends and Family: Three times a week    Attends Religious Services: 1 to 4 times per year    Active Member of Clubs or Organizations: Yes    Attends Banker Meetings: More than 4 times per year    Marital Status: Married    Tobacco Counseling Counseling given: Not Answered  Clinical Intake:  Pre-visit preparation completed: Yes  Pain : 0-10 Pain Score: 2  Pain Type: Chronic pain Pain Location: Hip Pain Orientation: Right Pain Descriptors / Indicators: Aching Pain Onset: More than a month ago Pain Frequency: Constant     Nutritional Risks: None Diabetes: Yes CBG done?: No Did pt. bring in CBG monitor from home?: No  How often do you need to have someone help you when you read instructions, pamphlets, or other written materials from your doctor or pharmacy?: 1 - Never  Interpreter Needed?: No  Information entered by :: NAllen LPN   Activities of Daily Living    05/16/2023    7:46 AM  In your present state of health, do you have any difficulty performing the following activities:  Hearing? 0  Vision? 0  Difficulty concentrating or making decisions? 0  Walking or climbing stairs? 0  Dressing or bathing? 0  Doing errands, shopping? 0  Preparing Food and eating ? N  Using the Toilet? N  In the past six months, have you accidently leaked urine? N  Do you have problems with loss of bowel control? N  Managing your Medications? N  Managing your Finances? N  Housekeeping or managing your Housekeeping? N    Patient Care Team: [name], MD as PCP - General (Internal Medicine)  Indicate any recent Medical Services you may have  received from other than [facility] providers in the past year (date may be approximate).     Assessment:   This is a routine wellness examination for Amyr.  Hearing/Vision screen Hearing Screening - Comments:: Denies hearing issues Vision Screening - Comments:: Regular eye exams, [facility]   Goals Addressed             This Visit's Progress    Patient Stated       05/17/2023, wants to lose weight       Depression Screen    05/17/2023    8:12 AM 05/06/2023    4:03 PM 03/05/2023    1:29 PM 12/19/2022    1:41 PM 09/26/2022    8:12 AM 06/26/2022    8:15 AM 03/27/2022   10:18 AM  PHQ 2/9 Scores  PHQ - 2 Score 0 0 0 0 0 0 0  PHQ- 9 Score 0  0        Fall Risk    05/16/2023    7:46 AM 05/06/2023    4:03 PM 03/05/2023    1:28 PM 12/19/2022    1:41 PM 09/26/2022    8:12 AM  Fall Risk   Falls in the past year? 0 0 0 0 0  Number falls in past yr: 0 0 0 0 0  Injury with Fall? 0 0 0 0 0  Risk for fall due to : Medication side effect No Fall Risks No Fall Risks No Fall Risks No Fall Risks  Follow up Falls prevention discussed;Falls evaluation completed Falls evaluation completed Falls evaluation completed Falls evaluation completed Falls evaluation completed    MEDICARE RISK AT HOME: Medicare Risk at Home Any stairs in or around the home?: (Patient-Rptd) Yes If so, are there any without handrails?: (Patient-Rptd) No Home free of loose throw rugs in walkways, pet beds, electrical cords, etc?: (Patient-Rptd) Yes Adequate lighting in your home to reduce risk of falls?: (Patient-Rptd) Yes Life alert?: (Patient-Rptd) No Use of a cane, walker or w/c?: (Patient-Rptd) No Grab bars in the bathroom?: (Patient-Rptd) No Shower chair or bench in shower?: (Patient-Rptd) Yes Elevated toilet  seat or a handicapped toilet?: (Patient-Rptd) No  TIMED UP AND GO:  Was the test performed? No    Cognitive Function:        05/17/2023    8:13 AM  6CIT Screen  What Year? 0 points  What month? 0 points   What time? 0 points  Count back from 20 0 points  Months in reverse 0 points  Repeat phrase 0 points  Total Score 0 points    Immunizations Immunization History  Administered Date(s) Administered   Fluad Quad(high Dose 65+) 01/08/2022, 01/10/2023   Influenza-Unspecified 01/08/2020   PFIZER(Purple Top)SARS-COV-2 Vaccination 06/20/2019, 07/21/2019, 01/30/2020, 08/27/2020   Pneumococcal Polysaccharide-23 05/25/2020    TDAP status: Due, Education has been provided regarding the importance of this vaccine. Advised may receive this vaccine at local pharmacy or Health Dept. Aware to provide a copy of the vaccination record if obtained from local pharmacy or Health Dept. Verbalized acceptance and understanding.  Flu Vaccine status: Up to date  Pneumococcal vaccine status: Due, Education has been provided regarding the importance of this vaccine. Advised may receive this vaccine at local pharmacy or Health Dept. Aware to provide a copy of the vaccination record if obtained from local pharmacy or Health Dept. Verbalized acceptance and understanding.  Covid-19 vaccine status: Information provided on how to obtain vaccines.   Qualifies for Shingles Vaccine? Yes   Zostavax completed No   Shingrix Completed?: No.    Education has been provided regarding the importance of this vaccine. Patient has been advised to call insurance company to determine out of pocket expense if they have not yet received this vaccine. Advised may also receive vaccine at local pharmacy or Health Dept. Verbalized acceptance and understanding.  Screening Tests Health Maintenance  Topic Date Due   Zoster Vaccines- Shingrix (1 of 2) Never done   FOOT EXAM  02/23/2021   Pneumonia Vaccine 21+ Years old (2 of 2 - PCV) 05/25/2021   COVID-19 Vaccine (5 - 2024-25 season) 12/09/2022   Colonoscopy  05/23/2023   OPHTHALMOLOGY EXAM  06/25/2023 (Originally 10/27/1965)   HEMOGLOBIN A1C  09/02/2023   Diabetic kidney evaluation -  eGFR measurement  12/19/2023   Diabetic kidney evaluation - Urine ACR  12/19/2023   Medicare Annual Wellness (AWV)  05/16/2024   INFLUENZA VACCINE  Completed   Hepatitis C Screening  Completed   HPV VACCINES  Aged Out   DTaP/Tdap/Td  Discontinued    Health Maintenance  Health Maintenance Due  Topic Date Due   Zoster Vaccines- Shingrix (1 of 2) Never done   FOOT EXAM  02/23/2021   Pneumonia Vaccine 54+ Years old (2 of 2 - PCV) 05/25/2021   COVID-19 Vaccine (5 - 2024-25 season) 12/09/2022   Colonoscopy  05/23/2023    Colorectal cancer screening: Type of screening: Colonoscopy. Completed 05/22/2013. Repeat every 10 years  Lung Cancer Screening: (Low Dose CT Chest recommended if Age 71-80 years, 20 pack-year currently smoking OR have quit w/in 15years.) does not qualify.   Lung Cancer Screening Referral: no  Additional Screening:  Hepatitis C Screening: does qualify; Completed 11/19/2015  Vision Screening: Recommended annual ophthalmology exams for early detection of glaucoma and other disorders of the eye. Is the patient up to date with their annual eye exam?  No  Who is the provider or what is the name of the office in which the patient attends annual eye exams? [facility] If pt is not established with a provider, would they like to be referred to a provider to  establish care? No .   Dental Screening: Recommended annual dental exams for proper oral hygiene  Diabetic Foot Exam: Diabetic Foot Exam: Overdue, Pt has been advised about the importance in completing this exam. Pt is scheduled for diabetic foot exam on next appointment.  Community Resource Referral / Chronic Care Management: CRR required this visit?  No   CCM required this visit?  No    Plan:     I have personally reviewed and noted the following in the patient's chart:   Medical and social history Use of alcohol, tobacco or illicit drugs  Current medications and supplements including opioid prescriptions.  Patient is not currently taking opioid prescriptions. Functional ability and status Nutritional status Physical activity Advanced directives List of other physicians Hospitalizations, surgeries, and ER visits in previous 12 months Vitals Screenings to include cognitive, depression, and falls Referrals and appointments  In addition, I have reviewed and discussed with patient certain preventive protocols, quality metrics, and best practice recommendations. A written personalized care plan for preventive services as well as general preventive health recommendations were provided to patient.     Ardella FORBES Dawn, LPN   10/08/7972   After Visit Summary: (MyChart) Due to this being a telephonic visit, the after visit summary with patients personalized plan was offered to patient via MyChart   Nurse Notes: none

## 2023-06-05 ENCOUNTER — Ambulatory Visit: Payer: Medicare Other | Admitting: Emergency Medicine

## 2023-06-10 LAB — PROCEDURE REPORT - SCANNED

## 2023-07-23 ENCOUNTER — Telehealth: Payer: Self-pay | Admitting: Neurology

## 2023-07-23 NOTE — Telephone Encounter (Signed)
Pt reschedule appt due to scheduling conflict

## 2023-07-30 ENCOUNTER — Institutional Professional Consult (permissible substitution): Admitting: Neurology

## 2023-08-07 ENCOUNTER — Institutional Professional Consult (permissible substitution): Admitting: Neurology

## 2023-08-21 ENCOUNTER — Ambulatory Visit: Admitting: Neurology

## 2023-08-21 ENCOUNTER — Encounter: Payer: Self-pay | Admitting: Neurology

## 2023-08-21 VITALS — BP 143/82 | HR 61 | Ht 68.0 in | Wt 234.0 lb

## 2023-08-21 DIAGNOSIS — G4726 Circadian rhythm sleep disorder, shift work type: Secondary | ICD-10-CM | POA: Diagnosis not present

## 2023-08-21 DIAGNOSIS — Z024 Encounter for examination for driving license: Secondary | ICD-10-CM

## 2023-08-21 DIAGNOSIS — Z9189 Other specified personal risk factors, not elsewhere classified: Secondary | ICD-10-CM | POA: Diagnosis not present
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
## 2023-08-21 NOTE — Patient Instructions (Addendum)
 Sleep Apnea  Sleep apnea is a condition that affects your breathing while you are sleeping. Your tongue or soft tissue in your throat may block the flow of air while you sleep. You may have shallow breathing or stop breathing for short periods of time. People with sleep apnea may snore loudly. There are three kinds of sleep apnea: Obstructive sleep apnea. This kind is caused by a blocked or collapsed airway. This is the most common. Central sleep apnea. This kind happens when the part of the brain that controls breathing does not send the correct signals to the muscles that control breathing. Mixed sleep apnea. This is a combination of obstructive and central sleep apnea. What are the causes? The most common cause of sleep apnea is a collapsed or blocked airway. What increases the risk? Being very overweight. Having family members with sleep apnea. Having a tongue or tonsils that are larger than normal. Having a small airway or jaw problems. Being older. What are the signs or symptoms? Loud snoring. Restless sleep. Trouble staying asleep. Being sleepy or tired during the day. Waking up gasping or choking. Having a headache in the morning. Mood swings. Having a hard time remembering things and concentrating. How is this diagnosed? A medical history. A physical exam. A sleep study. This is also called a polysomnography test. This test is done at a sleep lab or in your home while you are sleeping. How is this treated? Treatment may include: Sleeping on your side. Losing weight if you're overweight. Wearing an oral appliance. This is a mouthpiece that moves your lower jaw forward. Using a positive airway pressure (PAP) device to keep your airways open while you sleep, such as: A continuous positive airway pressure (CPAP) device. This device gives forced air through a mask when you breathe out. This keeps your airways open. A bilevel positive airway pressure (BIPAP) device. This  device gives forced air through a mask when you breathe in and when you breathe out to keep your airways open. Having surgery if other treatments do not work. If your sleep apnea is not treated, you may be at risk for: Heart failure. Heart attack. Stroke. Type 2 diabetes or a problem with your blood sugar called insulin  resistance. Follow these instructions at home: Medicines Take your medicines only as told by your health care provider. Avoid alcohol, medicines to help you relax, and certain pain medicines. These may make sleep apnea worse. General instructions Do not smoke, vape, or use products with nicotine or tobacco in them. If you need help quitting, talk with your provider. If you were given a PAP device to open your airway while you sleep, use it as told by your provider. If you're having surgery, make sure to tell your provider you have sleep apnea. You may need to bring your PAP device with you. Contact a health care provider if: The PAP device that you were given to use during sleep bothers you or does not seem to be working. You do not feel better or you feel worse. Get help right away if: You have trouble breathing. You have chest pain. You have trouble talking. One side of your body feels weak. A part of your face is hanging down. These symptoms may be an emergency. Call 911 right away. Do not wait to see if the symptoms will go away. Do not drive yourself to the hospital. This information is not intended to replace advice given to you by your health care provider.  Make sure you discuss any questions you have with your health care provider. Document Revised: 12/27/2022 Document Reviewed: 05/31/2022 Elsevier Patient Education  2024 Elsevier Inc.  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   2) no subjective sleep related symptoms.   PLAN : HST by watch pat .  Discussed risk factors for OSA, weight and distribution, core strength and sleep habits.  Advised to loose weight and to get regular exercise.   Shift work may get harder to tolerate, he needs more time in between long trips to get back to relax and sleep.   I plan to follow up either personally or through our NP within 5 months if HST was positive.

## 2023-09-12 ENCOUNTER — Ambulatory Visit
Admission: EM | Admit: 2023-09-12 | Discharge: 2023-09-12 | Disposition: A | Discharge status: Home / Self Care | Attending: Physician Assistant | Admitting: Physician Assistant

## 2023-09-12 ENCOUNTER — Other Ambulatory Visit: Payer: Self-pay

## 2023-09-12 ENCOUNTER — Ambulatory Visit (INDEPENDENT_AMBULATORY_CARE_PROVIDER_SITE_OTHER): Admitting: Radiology

## 2023-09-12 DIAGNOSIS — M19042 Primary osteoarthritis, left hand: Secondary | ICD-10-CM | POA: Diagnosis not present

## 2023-09-12 DIAGNOSIS — M67442 Ganglion, left hand: Secondary | ICD-10-CM | POA: Diagnosis not present

## 2023-09-12 DIAGNOSIS — R2232 Localized swelling, mass and lump, left upper limb: Secondary | ICD-10-CM | POA: Diagnosis not present

## 2023-09-12 MED ORDER — PREDNISONE 20 MG PO TABS: ORAL_TABLET | ORAL | 0 refills | Status: DC

## 2023-09-12 NOTE — Discharge Instructions (Addendum)
 You were seen today for concerns of left thumb pain and swelling. At this time your symptoms appear consistent with what is called a ganglion cyst.  Your x-ray was notable for some arthritis but did not show signs of an acute fracture or dislocation. I have included information about ganglion cyst as well as drainage and removal.  I recommend that you follow-up with an orthopedics provider to further assess your thumb and to provide more definitive management. Since it is not recommended that you take NSAID medications I am sending in a medication called prednisone .  This is a steroid that should help reduce inflammation and help with some of your pain.  We have also sent you home with of hand and wrist brace to help stabilize your thumb and hopefully reduce some of the inflammation that you are having.  I have sent in a script for Prednisone  taper to be taken in the morning with breakfast per the instructions on the container Remember that steroids can cause sleeplessness, irritability, increased hunger and elevated glucose levels so be mindful of these side effects. They should lessen as you progress to the lower doses of the taper.  [facility] [address], [address], [place], [place] [address] [phone]  [facility] [address] [place], [place] [address]  [phone]

## 2023-09-12 NOTE — ED Provider Notes (Addendum)
 [facility]    CSN: [id number] Arrival date & time: 09/12/23  0905      History   Chief Complaint Chief Complaint  Patient presents with   Hand Pain    HPI Larry Dawson is a 68 y.o. male.   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   Hydrocele    Hyperlipidemia    Hypertension    IBS (irritable bowel syndrome) 05/22/13   per chart note-pt denied    Patient Active Problem List   Diagnosis Date Noted   Shifting sleep-work schedule, affecting sleep 08/21/2023   At risk for obstructive sleep apnea 08/21/2023   Rash and nonspecific skin eruption 05/06/2023   Suspected sleep apnea 05/06/2023   Cheilitis 03/05/2023   Stage 3a chronic kidney disease (HCC) 12/20/2022   Lower urinary tract symptoms (LUTS) 06/26/2022   Lumbar degenerative disc disease 03/16/2021   Erectile dysfunction 10/04/2020   Dyslipidemia associated with type 2 diabetes mellitus (HCC) 08/06/2018   Dyslipidemia 08/06/2018   Hypertension associated with type 2 diabetes mellitus (HCC) 07/18/2011    Past Surgical History:  Procedure Laterality Date   HERNIA REPAIR  prior to 2013   HYDROCELE EXCISION Bilateral 11/23/2013   Procedure: HYDROCELECTOMY ADULT;  Surgeon: Livingston Rigg, MD;  Location: [facility];  Service: Urology;  Laterality: Bilateral;   VASECTOMY  prior to 2013       Home Medications    Prior to Admission medications   Medication Sig Start Date  End Date Taking? Authorizing Provider  predniSONE  (DELTASONE ) 20 MG tablet Take 60mg  PO daily x 2 days, then40mg  PO daily x 2 days, then 20mg  PO daily x 3 days 09/12/23  Yes Yale Golla E, PA-C  Ascorbic Acid (VITAMIN C PO) Take by mouth.    [provider]  atenolol -chlorthalidone  (TENORETIC ) 50-25 MG tablet TAKE 1 TABLET BY MOUTH DAILY 04/11/23 04/05/24  Sagardia, Miguel Jose, MD  cetirizine  (ZYRTEC ) 10 MG tablet Take 1 tablet (10 mg total) by mouth daily. 05/10/17   Burky, Natalie B, NP  chlorhexidine  (PERIDEX ) 0.12 % solution Use as directed 15 mLs in the mouth or throat 2 (two) times daily. 05/06/23   Sagardia, Miguel Jose, MD  cholecalciferol (VITAMIN D) 1000 units tablet Take 1,000 Units by mouth daily.    [provider]  cyclobenzaprine  (FLEXERIL ) 10 MG tablet Take 1 tablet (10 mg total) by mouth 3 (three) times daily as needed for muscle spasms. 12/04/21   Debbra Fairy, PA-C  empagliflozin  (JARDIANCE ) 10 MG TABS tablet Take 1 tablet (10 mg total) by mouth daily. 12/20/22   Elvira Hammersmith, MD  glipiZIDE  (GLUCOTROL ) 5 MG tablet TAKE 1 TABLET BY MOUTH 2 TIMES A DAY BEFORE A MEAL 05/11/23   Elvira Hammersmith, MD  ibuprofen  (ADVIL ) 600 MG tablet Take 1 tablet (600 mg total) by mouth every 6 (six) hours as  needed. 12/04/21   Debbra Fairy, PA-C  metFORMIN  (GLUCOPHAGE ) 500 MG tablet Take 1 tablet (500 mg total) by mouth 2 (two) times daily with a meal. 12/19/22   Sagardia, Isidro Margo, MD  Multiple Vitamins-Minerals (ZINC PO) Take by mouth.    [provider]  Omega-3 Fatty Acids (FISH OIL) 1000 MG CAPS Take by mouth.    [provider]  rosuvastatin  (CRESTOR ) 20 MG tablet Take 1 tablet (20 mg total) by mouth daily. 12/19/22   Elvira Hammersmith, MD  sildenafil  (VIAGRA ) 100 MG tablet Take 0.5-1 tablets (50-100 mg total) by mouth daily as needed for erectile dysfunction. 10/04/20   Sagardia, Miguel Jose, MD  Turmeric POWD by Does not apply route daily.    [provider]    Family History Family History  Problem Relation Age of Onset   Diabetes Father    Hypertension Father    Diabetes Brother    Hypertension Brother    Diabetes Brother    Diabetes Brother     Social History Social History   Tobacco Use   Smoking status: Former    Types: Cigarettes   Smokeless tobacco: Never  Vaping Use   Vaping status: Never Used  Substance Use Topics   Alcohol use: Not Currently    Comment: rarely   Drug use: No     Allergies   Ace inhibitors, Penicillins, and Latex   Review of Systems Review of Systems  Musculoskeletal:  Positive for arthralgias and joint swelling.     Physical Exam Triage Vital Signs ED Triage Vitals  Encounter Vitals Group     BP 09/12/23 0952 (!) 149/80     Systolic BP Percentile --      Diastolic BP Percentile --      Pulse Rate 09/12/23 0952 76     Resp 09/12/23 0952 19     Temp 09/12/23 0952 97.9 F (36.6 C)     Temp Source 09/12/23 0952 Oral     SpO2 09/12/23 0952 97 %     Weight 09/12/23 0954 230 lb (104.3 kg)     Height 09/12/23 0954 5\' 8"  (1.727 m)     Head Circumference --      Peak Flow --      Pain Score 09/12/23 0953 8     Pain Loc --      Pain Education --      Exclude from Growth Chart --    No data found.  Updated Vital Signs BP (!) 149/80 (BP Location: Right Arm)   Pulse 76   Temp 97.9 F (36.6 C) (Oral)   Resp 19   Ht 5\' 8"  (1.727 m)   Wt 230 lb (104.3 kg)   SpO2 97%   BMI 34.97 kg/m   Visual Acuity Right Eye Distance:   Left Eye Distance:   Bilateral Distance:    Right Eye Near:   Left Eye Near:    Bilateral Near:     Physical Exam Vitals reviewed.  Constitutional:      General: He is awake.     Appearance: Normal appearance. He is well-developed and well-groomed.  HENT:     Head: Normocephalic and atraumatic.  Eyes:     Extraocular Movements: Extraocular movements intact.     Conjunctiva/sclera: Conjunctivae normal.  Pulmonary:     Effort: Pulmonary  effort is normal.  Musculoskeletal:     Right hand: Normal.     Left hand: Swelling and tenderness present. Decreased range  of motion. Normal strength. Normal capillary refill. Normal pulse.       Hands:     Cervical back: Normal range of motion.     Comments: Pt has catching with flexion of thumb  He is able to fully flex and form a fist but this is painful    Neurological:     General: No focal deficit present.     Mental Status: He is alert and oriented to person, place, and time.     GCS: GCS eye subscore is 4. GCS verbal subscore is 5. GCS motor subscore is 6.  Psychiatric:        Attention and Perception: Attention normal.        Mood and Affect: Mood normal.        Speech: Speech normal.        Behavior: Behavior normal. Behavior is cooperative.      UC Treatments / Results  Labs (all labs ordered are listed, but only abnormal results are displayed) Labs Reviewed - No data to display  EKG   Radiology DG Finger Thumb Left Result Date: 09/12/2023 CLINICAL DATA:  Left thumb pain for 2 weeks, palpable abnormality EXAM: LEFT THUMB 2+V COMPARISON:  None Available. FINDINGS: Frontal, oblique, and lateral views of the left first digit are obtained. No fracture, subluxation, or dislocation. There is mild to moderate joint space narrowing and osteophyte formation of the first metacarpophalangeal and interphalangeal joints, compatible with osteoarthritis. No erosive changes. Mild osteoarthritis within the radial aspect of the carpus. Soft tissues appear unremarkable. IMPRESSION: 1. Mild to moderate osteoarthritis.  No acute fracture. Electronically Signed   By: Bobbye Burrow M.D.   On: 09/12/2023 10:14    Procedures Procedures (including critical care time)  Medications Ordered in UC Medications - No data to display  Initial Impression / Assessment and Plan / UC Course  I have reviewed the triage vital signs and the nursing notes.  Pertinent labs & imaging results that were  available during my care of the patient were reviewed by me and considered in my medical decision making (see chart for details).    His most recent A1c was 6.7%.  Most recent EGFR was less than 60  Final Clinical Impressions(s) / UC Diagnoses   Final diagnoses:  Ganglion cyst of finger of left hand  Patient presents today with concerns for swelling along the IP joint of the left thumb.  He reports that this has been present for about 2 weeks and is associated with a catching sensation when he tries to flex the thumb.  He is able to form a full fist but this is painful.  He also reports of burning sensation and pain along the thumb and the snuffbox.  He denies known injury or trauma to the area.  Imaging demonstrates mild to moderate osteoarthritis but no acute fracture or dislocation per radiology interpretation.  Physical exam shows a nodular formation along the IP joint that appears consistent with likely ganglion cyst.  Patient does have a catching halting movement with flexion.  Reviewed with patient that ganglion cysts are typically managed by orthopedics and I recommend follow-up with them for more definitive management.  Today we will provide a thumb spica splint to assist with stabilization and comfort.  I am also sending in a prednisone  taper to assist with inflammation.  I reviewed with patient that he does have decreased kidney function and it is not recommended that he takes NSAID medications as these can be rough  on the kidneys.  Recommend Tylenol  as needed for further pain management.  Contact information provided for orthopedic urgent cares in the area.  Recommend close follow-up with a hand specialist for further management.  Patient voiced agreement and understanding with recommendations.  Follow-up as needed.    Discharge Instructions      You were seen today for concerns of left thumb pain and swelling. At this time your symptoms appear consistent with what is called a ganglion  cyst.  Your x-ray was notable for some arthritis but did not show signs of an acute fracture or dislocation. I have included information about ganglion cyst as well as drainage and removal.  I recommend that you follow-up with an orthopedics provider to further assess your thumb and to provide more definitive management. Since it is not recommended that you take NSAID medications I am sending in a medication called prednisone .  This is a steroid that should help reduce inflammation and help with some of your pain.  We have also sent you home with of hand and wrist brace to help stabilize your thumb and hopefully reduce some of the inflammation that you are having.  I have sent in a script for Prednisone  taper to be taken in the morning with breakfast per the instructions on the container Remember that steroids can cause sleeplessness, irritability, increased hunger and elevated glucose levels so be mindful of these side effects. They should lessen as you progress to the lower doses of the taper.  [facility] [address], [address], [place], [place] [address] [phone]  [facility] [address] [place], [place] [address]  [phone]     ED Prescriptions     Medication Sig Dispense Auth. Provider   predniSONE  (DELTASONE ) 20 MG tablet Take 60mg  PO daily x 2 days, then40mg  PO daily x 2 days, then 20mg  PO daily x 3 days 13 tablet Fortunata Betty E, PA-C      PDMP not reviewed this encounter.   Jerona Mooring, PA-C 09/12/23 1113    Ryan Ogborn, Pearla Bottom, PA-C 09/12/23 1114

## 2023-09-12 NOTE — ED Triage Notes (Signed)
 Pt presents with complaints of left thumb pain x 2 weeks. Pt denies injury to the hand. Pt currently rates his overall thumb pain an 8/10. Describes the finger as burning and his hands as aching. OTC Ibuprofen  + Tylenol  taken with no relief or improvement in symptoms. Pt states there is a "knot" on the joint of the thumb.

## 2023-09-15 ENCOUNTER — Other Ambulatory Visit: Payer: Self-pay | Admitting: Emergency Medicine

## 2023-09-15 DIAGNOSIS — E1169 Type 2 diabetes mellitus with other specified complication: Secondary | ICD-10-CM

## 2023-09-15 DIAGNOSIS — E1159 Type 2 diabetes mellitus with other circulatory complications: Secondary | ICD-10-CM

## 2023-09-17 ENCOUNTER — Ambulatory Visit (INDEPENDENT_AMBULATORY_CARE_PROVIDER_SITE_OTHER): Admitting: Neurology

## 2023-09-17 DIAGNOSIS — Z024 Encounter for examination for driving license: Secondary | ICD-10-CM

## 2023-09-17 DIAGNOSIS — G4733 Obstructive sleep apnea (adult) (pediatric): Secondary | ICD-10-CM

## 2023-09-17 DIAGNOSIS — G4726 Circadian rhythm sleep disorder, shift work type: Secondary | ICD-10-CM

## 2023-09-17 DIAGNOSIS — Z9189 Other specified personal risk factors, not elsewhere classified: Secondary | ICD-10-CM
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
## 2023-09-20 ENCOUNTER — Ambulatory Visit: Admitting: Physician Assistant

## 2023-09-22 ENCOUNTER — Ambulatory Visit: Payer: Self-pay | Admitting: Neurology

## 2023-09-22 DIAGNOSIS — Z024 Encounter for examination for driving license: Secondary | ICD-10-CM | POA: Insufficient documentation

## 2023-09-22 DIAGNOSIS — G4733 Obstructive sleep apnea (adult) (pediatric): Secondary | ICD-10-CM | POA: Insufficient documentation
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
## 2023-09-23 NOTE — Telephone Encounter (Signed)
-----   Message from Potomac Dohmeier sent at 09/22/2023  9:16 PM EDT ----- recommend positive airway pressure therapy and since snoring was rather mild,  I think he could get by with a nasal interface /nasal pillow Judythe Nurse cradle, and an auto-titration CPAP device set  between 6 and 18 cm water pressure, 3 cm EPR, heated humidification.  Weight loss is rec.  Follow up for DOT with NP  ----- Message ----- From: Neomia Banner, MD Sent: 09/22/2023   9:14 PM EDT To: Neomia Banner, MD

## 2023-09-30 ENCOUNTER — Encounter: Payer: Self-pay | Admitting: Emergency Medicine

## 2023-10-02 ENCOUNTER — Ambulatory Visit (INDEPENDENT_AMBULATORY_CARE_PROVIDER_SITE_OTHER): Admitting: Emergency Medicine

## 2023-10-02 ENCOUNTER — Encounter: Payer: Self-pay | Admitting: Emergency Medicine

## 2023-10-02 VITALS — BP 118/74 | HR 68 | Temp 97.6°F | Ht 68.0 in | Wt 226.0 lb

## 2023-10-02 DIAGNOSIS — E785 Hyperlipidemia, unspecified: Secondary | ICD-10-CM | POA: Diagnosis not present

## 2023-10-02 DIAGNOSIS — Z7984 Long term (current) use of oral hypoglycemic drugs: Secondary | ICD-10-CM

## 2023-10-02 DIAGNOSIS — E1159 Type 2 diabetes mellitus with other circulatory complications: Secondary | ICD-10-CM | POA: Diagnosis not present

## 2023-10-02 DIAGNOSIS — I152 Hypertension secondary to endocrine disorders: Secondary | ICD-10-CM | POA: Diagnosis not present

## 2023-10-02 DIAGNOSIS — E1169 Type 2 diabetes mellitus with other specified complication: Secondary | ICD-10-CM | POA: Diagnosis not present

## 2023-10-02 LAB — CBC WITH DIFFERENTIAL/PLATELET
Basophils Absolute: 0.1 10*3/uL (ref 0.0–0.1)
Basophils Relative: 1 % (ref 0.0–3.0)
Eosinophils Absolute: 0.1 10*3/uL (ref 0.0–0.7)
Eosinophils Relative: 0.7 % (ref 0.0–5.0)
HCT: 42.9 % (ref 39.0–52.0)
Hemoglobin: 14.5 g/dL (ref 13.0–17.0)
Lymphocytes Relative: 33.2 % (ref 12.0–46.0)
Lymphs Abs: 2.3 10*3/uL (ref 0.7–4.0)
MCHC: 33.9 g/dL (ref 30.0–36.0)
MCV: 82.6 fl (ref 78.0–100.0)
Monocytes Absolute: 0.6 10*3/uL (ref 0.1–1.0)
Monocytes Relative: 8.6 % (ref 3.0–12.0)
Neutro Abs: 3.9 10*3/uL (ref 1.4–7.7)
Neutrophils Relative %: 56.5 % (ref 43.0–77.0)
Platelets: 267 10*3/uL (ref 150.0–400.0)
RBC: 5.19 Mil/uL (ref 4.22–5.81)
RDW: 13.4 % (ref 11.5–15.5)
WBC: 7 10*3/uL (ref 4.0–10.5)

## 2023-10-02 LAB — COMPREHENSIVE METABOLIC PANEL WITH GFR
ALT: 22 U/L (ref 0–53)
AST: 16 U/L (ref 0–37)
Albumin: 4.1 g/dL (ref 3.5–5.2)
Alkaline Phosphatase: 61 U/L (ref 39–117)
BUN: 16 mg/dL (ref 6–23)
CO2: 31 meq/L (ref 19–32)
Calcium: 9.4 mg/dL (ref 8.4–10.5)
Chloride: 94 meq/L — ABNORMAL LOW (ref 96–112)
Creatinine, Ser: 1.31 mg/dL (ref 0.40–1.50)
GFR: 56.55 mL/min — ABNORMAL LOW (ref >60.00)
Glucose, Bld: 373 mg/dL — ABNORMAL HIGH (ref 70–99)
Potassium: 3.6 meq/L (ref 3.5–5.1)
Sodium: 132 meq/L — ABNORMAL LOW (ref 135–145)
Total Bilirubin: 1.6 mg/dL — ABNORMAL HIGH (ref 0.2–1.2)
Total Protein: 7.3 g/dL (ref 6.0–8.3)

## 2023-10-02 LAB — POCT GLYCOSYLATED HEMOGLOBIN (HGB A1C): Hemoglobin A1C: 12.1 % — AB (ref 4.0–5.6)

## 2023-10-02 MED ORDER — TIRZEPATIDE 2.5 MG/0.5ML ~~LOC~~ SOAJ: 2.5000 mg | SUBCUTANEOUS | 2 refills | Status: DC

## 2023-10-02 MED ORDER — EMPAGLIFLOZIN 25 MG PO TABS: 25.0000 mg | ORAL_TABLET | Freq: Every day | ORAL | 3 refills | Status: DC

## 2023-10-02 NOTE — Progress Notes (Signed)
 Larry Dawson 68 y.o.   Chief Complaint  Patient presents with   Blood Sugar Problem    Patient here for because he sugar levels been around 320 nothing lower than that. He was advised that predniSONE  would make his sugar go up but its been about a week and its still up and has not gone down.    HISTORY OF PRESENT ILLNESS: This is a 68 y.o. male complaining of elevated blood sugar levels at home Took 5 days of prednisone  in the beginning of this month No other complaints or medical concerns today. Lab Results  Component Value Date   HGBA1C 6.7 (A) 03/05/2023     HPI   Prior to Admission medications   Medication Sig Start Date End Date Taking? Authorizing Provider  Ascorbic Acid (VITAMIN C PO) Take by mouth.   Yes [provider]  atenolol -chlorthalidone  (TENORETIC ) 50-25 MG tablet TAKE 1 TABLET BY MOUTH DAILY 04/11/23 04/05/24 Yes Gratia Disla, Emil Schanz, MD  cetirizine  (ZYRTEC ) 10 MG tablet Take 1 tablet (10 mg total) by mouth daily. 05/10/17  Yes Burky, Natalie B, NP  chlorhexidine  (PERIDEX ) 0.12 % solution Use as directed 15 mLs in the mouth or throat 2 (two) times daily. 05/06/23  Yes Mamoru Takeshita, Emil Schanz, MD  cholecalciferol (VITAMIN D) 1000 units tablet Take 1,000 Units by mouth daily.   Yes [provider]  cyclobenzaprine  (FLEXERIL ) 10 MG tablet Take 1 tablet (10 mg total) by mouth 3 (three) times daily as needed for muscle spasms. 12/04/21  Yes Nivia Colon, PA-C  empagliflozin  (JARDIANCE ) 10 MG TABS tablet Take 1 tablet (10 mg total) by mouth daily. 12/20/22  Yes Cerra Eisenhower, Emil Schanz, MD  glipiZIDE  (GLUCOTROL ) 5 MG tablet TAKE 1 TABLET BY MOUTH 2 TIMES A DAY BEFORE A MEAL 09/15/23  Yes Sajid Ruppert, Emil Schanz, MD  ibuprofen  (ADVIL ) 600 MG tablet Take 1 tablet (600 mg total) by mouth every 6 (six) hours as needed. 12/04/21  Yes Nivia Colon, PA-C  metFORMIN  (GLUCOPHAGE ) 500 MG tablet Take 1 tablet (500 mg total) by mouth 2 (two) times daily with a meal. 12/19/22  Yes  Davonda Ausley, Emil Schanz, MD  Multiple Vitamins-Minerals (ZINC PO) Take by mouth.   Yes [provider]  Omega-3 Fatty Acids (FISH OIL) 1000 MG CAPS Take by mouth.   Yes [provider]  rosuvastatin  (CRESTOR ) 20 MG tablet Take 1 tablet (20 mg total) by mouth daily. 12/19/22  Yes Antoniette Peake, Emil Schanz, MD  sildenafil  (VIAGRA ) 100 MG tablet Take 0.5-1 tablets (50-100 mg total) by mouth daily as needed for erectile dysfunction. 10/04/20  Yes Kalep Full, Emil Schanz, MD  Turmeric POWD by Does not apply route daily.   Yes [provider]  predniSONE  (DELTASONE ) 20 MG tablet Take 60mg  PO daily x 2 days, then40mg  PO daily x 2 days, then 20mg  PO daily x 3 days Patient not taking: Reported on 10/02/2023 09/12/23   Mecum, Erin E, PA-C    Allergies  Allergen Reactions   Ace Inhibitors Swelling    Suspected angioedema   Penicillins Hives, Rash and Other (See Comments)    Throat swells   Latex Hives, Swelling and Rash    Patient Active Problem List   Diagnosis Date Noted   Encounter for Department of Transportation (DOT) examination for driving license renewal 93/84/7974   OSA (obstructive sleep apnea) 09/22/2023   Shifting sleep-work schedule, affecting sleep 08/21/2023   At risk for obstructive sleep apnea 08/21/2023   Rash and nonspecific skin eruption 05/06/2023  Suspected sleep apnea 05/06/2023   Cheilitis 03/05/2023   Stage 3a chronic kidney disease (HCC) 12/20/2022   Lower urinary tract symptoms (LUTS) 06/26/2022   Lumbar degenerative disc disease 03/16/2021   Erectile dysfunction 10/04/2020   Dyslipidemia associated with type 2 diabetes mellitus (HCC) 08/06/2018   Dyslipidemia 08/06/2018   Hypertension associated with type 2 diabetes mellitus (HCC) 07/18/2011    Past Medical History:  Diagnosis Date   Allergy    Anal fissure    COPD (chronic obstructive pulmonary disease) (HCC)    Albuterol  PRN.   Hydrocele    Hyperlipidemia    Hypertension    IBS  (irritable bowel syndrome) 05/22/13   per chart note-pt denied    Past Surgical History:  Procedure Laterality Date   HERNIA REPAIR  prior to 2013   HYDROCELE EXCISION Bilateral 11/23/2013   Procedure: HYDROCELECTOMY ADULT;  Surgeon: Alm GORMAN Fragmin, MD;  Location: [facility];  Service: Urology;  Laterality: Bilateral;   VASECTOMY  prior to 2013    Social History   Socioeconomic History   Marital status: Married    Spouse name: Not on file   Number of children: Not on file   Years of education: Not on file   Highest education level: Some college, no degree  Occupational History   Not on file  Tobacco Use   Smoking status: Former    Types: Cigarettes   Smokeless tobacco: Never  Vaping Use   Vaping status: Never Used  Substance and Sexual Activity   Alcohol use: Not Currently    Comment: rarely   Drug use: No   Sexual activity: Not on file  Other Topics Concern   Not on file  Social History Narrative   Marital status: married      Employment: [employer]; drives all over.  Owns company.      Tobacco:  Quit smoking in 2005; smoked 25 years      Alcohol:  Rare beer      Drugs:  None      Exercise:  None   Daily caffeine    Social Drivers of Corporate investment banker Strain: Low Risk  (05/16/2023)   Overall Financial Resource Strain (CARDIA)    Difficulty of Paying Living Expenses: Not very hard  Food Insecurity: No Food Insecurity (05/16/2023)   Hunger Vital Sign    Worried About Running Out of Food in the Last Year: Never true    Ran Out of Food in the Last Year: Never true  Transportation Needs: No Transportation Needs (05/16/2023)   PRAPARE - Administrator, Civil Service (Medical): No    Lack of Transportation (Non-Medical): No  Physical Activity: Insufficiently Active (05/16/2023)   Exercise Vital Sign    Days of Exercise per Week: 2 days    Minutes of Exercise per Session: 40 min  Stress: Stress Concern Present (05/16/2023)    [employer] of Occupational Health - Occupational Stress Questionnaire    Feeling of Stress : To some extent  Social Connections: Socially Integrated (05/16/2023)   Social Connection and Isolation Panel    Frequency of Communication with Friends and Family: More than three times a week    Frequency of Social Gatherings with Friends and Family: Three times a week    Attends Religious Services: 1 to 4 times per year    Active Member of Clubs or Organizations: Yes    Attends Banker Meetings: More than 4 times per year  Marital Status: Married  Catering manager Violence: Not At Risk (05/17/2023)   Humiliation, Afraid, Rape, and Kick questionnaire    Fear of Current or Ex-Partner: No    Emotionally Abused: No    Physically Abused: No    Sexually Abused: No    Family History  Problem Relation Age of Onset   Diabetes Father    Hypertension Father    Diabetes Brother    Hypertension Brother    Diabetes Brother    Diabetes Brother      Review of Systems  Constitutional: Negative.  Negative for chills and fever.  HENT: Negative.  Negative for congestion and sore throat.   Respiratory: Negative.  Negative for cough and shortness of breath.   Cardiovascular: Negative.  Negative for chest pain and palpitations.  Gastrointestinal:  Negative for abdominal pain, nausea and vomiting.  Genitourinary: Negative.  Negative for dysuria and urgency.  Skin: Negative.  Negative for rash.  Neurological: Negative.  Negative for dizziness and headaches.  All other systems reviewed and are negative.   Today's Vitals   10/02/23 1436  BP: 118/74  Pulse: 68  Temp: 97.6 F (36.4 C)  TempSrc: Oral  SpO2: 95%  Weight: 226 lb (102.5 kg)  Height: 5' 8 (1.727 m)   Body mass index is 34.36 kg/m.   Physical Exam Vitals reviewed.  Constitutional:      Appearance: Normal appearance.  HENT:     Head: Normocephalic.     Mouth/Throat:     Mouth: Mucous membranes are moist.      Pharynx: Oropharynx is clear.   Eyes:     Extraocular Movements: Extraocular movements intact.     Pupils: Pupils are equal, round, and reactive to light.    Cardiovascular:     Rate and Rhythm: Normal rate and regular rhythm.     Pulses: Normal pulses.     Heart sounds: Normal heart sounds.  Pulmonary:     Effort: Pulmonary effort is normal.     Breath sounds: Normal breath sounds.   Musculoskeletal:     Cervical back: No tenderness.  Lymphadenopathy:     Cervical: No cervical adenopathy.   Skin:    General: Skin is warm and dry.     Capillary Refill: Capillary refill takes less than 2 seconds.   Neurological:     General: No focal deficit present.     Mental Status: He is alert and oriented to person, place, and time.   Psychiatric:        Mood and Affect: Mood normal.        Behavior: Behavior normal.     Results for orders placed or performed in visit on 10/02/23 (from the past 24 hours)  POCT HgB A1C     Status: Abnormal   Collection Time: 10/02/23  2:51 PM  Result Value Ref Range   Hemoglobin A1C 12.1 (A) 4.0 - 5.6 %   HbA1c POC (<> result, manual entry)     HbA1c, POC (prediabetic range)     HbA1c, POC (controlled diabetic range)      ASSESSMENT & PLAN: A total of 44 minutes was spent with the patient and counseling/coordination of care regarding preparing for this visit, review of most recent office visit notes, review of multiple chronic medical conditions and their management, cardiovascular risks associated with uncontrolled diabetes, review of all medications and changes made, review of most recent bloodwork results including interpretation of today's hemoglobin A1c, review of health maintenance items, education on  nutrition, prognosis, documentation, and need for follow up.   Problem List Items Addressed This Visit       Cardiovascular and Mediastinum   Hypertension associated with type 2 diabetes mellitus (HCC) - Primary   BP Readings from Last 3  Encounters:  10/02/23 118/74  09/12/23 (!) 149/80  08/21/23 (!) 143/82  Well-controlled hypertension Continue Tenoretic  50-25 mg daily Uncontrolled diabetes with hemoglobin A1c at 12.1 Cardiovascular risks associated with uncontrolled diabetes discussed Diet and nutrition discussed Benefits of exercise discussed Recommend to continue metformin  500 mg twice a day and glipizide  5 mg twice a day Increase Jardiance  to 25 mg daily and start weekly GLP-1 agonist as per insurance's formulary.  Recommend Mounjaro . Follow-up in 3 months       Relevant Medications   empagliflozin  (JARDIANCE ) 25 MG TABS tablet   tirzepatide  (MOUNJARO ) 2.5 MG/0.5ML Pen   Other Relevant Orders   POCT HgB A1C (Completed)   CBC with Differential/Platelet   Comprehensive metabolic panel with GFR     Endocrine   Dyslipidemia associated with type 2 diabetes mellitus (HCC)   Uncontrolled diabetes Hemoglobin A1c at 12.1 Continue metformin  500 mg twice a day and glipizide  5 mg twice a day Continue rosuvastatin  20 mg daily Increase Jardiance  to 25 mg and start weekly GLP-1 agonist.  Recommend Mounjaro . Diet and nutrition discussed      Relevant Medications   empagliflozin  (JARDIANCE ) 25 MG TABS tablet   tirzepatide  (MOUNJARO ) 2.5 MG/0.5ML Pen   Patient Instructions  Diabetes Mellitus and Nutrition, Adult When you have diabetes, or diabetes mellitus, it is very important to have healthy eating habits because your blood sugar (glucose) levels are greatly affected by what you eat and drink. Eating healthy foods in the right amounts, at about the same times every day, can help you: Manage your blood glucose. Lower your risk of heart disease. Improve your blood pressure. Reach or maintain a healthy weight. What can affect my meal plan? Every person with diabetes is different, and each person has different needs for a meal plan. Your health care provider may recommend that you work with a dietitian to make a meal  plan that is best for you. Your meal plan may vary depending on factors such as: The calories you need. The medicines you take. Your weight. Your blood glucose, blood pressure, and cholesterol levels. Your activity level. Other health conditions you have, such as heart or kidney disease. How do carbohydrates affect me? Carbohydrates, also called carbs, affect your blood glucose level more than any other type of food. Eating carbs raises the amount of glucose in your blood. It is important to know how many carbs you can safely have in each meal. This is different for every person. Your dietitian can help you calculate how many carbs you should have at each meal and for each snack. How does alcohol affect me? Alcohol can cause a decrease in blood glucose (hypoglycemia), especially if you use insulin  or take certain diabetes medicines by mouth. Hypoglycemia can be a life-threatening condition. Symptoms of hypoglycemia, such as sleepiness, dizziness, and confusion, are similar to symptoms of having too much alcohol. Do not drink alcohol if: Your health care provider tells you not to drink. You are pregnant, may be pregnant, or are planning to become pregnant. If you drink alcohol: Limit how much you have to: 0-1 drink a day for women. 0-2 drinks a day for men. Know how much alcohol is in your drink. In the [place] one drink equals  one 12 oz bottle of beer (355 mL), one 5 oz glass of wine (148 mL), or one 1 oz glass of hard liquor (44 mL). Keep yourself hydrated with water, diet soda, or unsweetened iced tea. Keep in mind that regular soda, juice, and other mixers may contain a lot of sugar and must be counted as carbs. What are tips for following this plan?  Reading food labels Start by checking the serving size on the Nutrition Facts label of packaged foods and drinks. The number of calories and the amount of carbs, fats, and other nutrients listed on the label are based on one serving of the  item. Many items contain more than one serving per package. Check the total grams (g) of carbs in one serving. Check the number of grams of saturated fats and trans fats in one serving. Choose foods that have a low amount or none of these fats. Check the number of milligrams (mg) of salt (sodium) in one serving. Most people should limit total sodium intake to less than 2,300 mg per day. Always check the nutrition information of foods labeled as low-fat or nonfat. These foods may be higher in added sugar or refined carbs and should be avoided. Talk to your dietitian to identify your daily goals for nutrients listed on the label. Shopping Avoid buying canned, pre-made, or processed foods. These foods tend to be high in fat, sodium, and added sugar. Shop around the outside edge of the grocery store. This is where you will most often find fresh fruits and vegetables, bulk grains, fresh meats, and fresh dairy products. Cooking Use low-heat cooking methods, such as baking, instead of high-heat cooking methods, such as deep frying. Cook using healthy oils, such as olive, canola, or sunflower oil. Avoid cooking with butter, cream, or high-fat meats. Meal planning Eat meals and snacks regularly, preferably at the same times every day. Avoid going long periods of time without eating. Eat foods that are high in fiber, such as fresh fruits, vegetables, beans, and whole grains. Eat 4-6 oz (112-168 g) of lean protein each day, such as lean meat, chicken, fish, eggs, or tofu. One ounce (oz) (28 g) of lean protein is equal to: 1 oz (28 g) of meat, chicken, or fish. 1 egg.  cup (62 g) of tofu. Eat some foods each day that contain healthy fats, such as avocado, nuts, seeds, and fish. What foods should I eat? Fruits Berries. Apples. Oranges. Peaches. Apricots. Plums. Grapes. Mangoes. Papayas. Pomegranates. Kiwi. Cherries. Vegetables Leafy greens, including lettuce, spinach, kale, chard, collard greens,  mustard greens, and cabbage. Beets. Cauliflower. Broccoli. Carrots. Green beans. Tomatoes. Peppers. Onions. Cucumbers. Brussels sprouts. Grains Whole grains, such as whole-wheat or whole-grain bread, crackers, tortillas, cereal, and pasta. Unsweetened oatmeal. Quinoa. Brown or wild rice. Meats and other proteins Seafood. Poultry without skin. Lean cuts of poultry and beef. Tofu. Nuts. Seeds. Dairy Low-fat or fat-free dairy products such as milk, yogurt, and cheese. The items listed above may not be a complete list of foods and beverages you can eat and drink. Contact a dietitian for more information. What foods should I avoid? Fruits Fruits canned with syrup. Vegetables Canned vegetables. Frozen vegetables with butter or cream sauce. Grains Refined white flour and flour products such as bread, pasta, snack foods, and cereals. Avoid all processed foods. Meats and other proteins Fatty cuts of meat. Poultry with skin. Breaded or fried meats. Processed meat. Avoid saturated fats. Dairy Full-fat yogurt, cheese, or milk. Beverages Sweetened drinks, such  as soda or iced tea. The items listed above may not be a complete list of foods and beverages you should avoid. Contact a dietitian for more information. Questions to ask a health care provider Do I need to meet with a certified diabetes care and education specialist? Do I need to meet with a dietitian? What number can I call if I have questions? When are the best times to check my blood glucose? Where to find more information: American Diabetes Association: diabetes.org Academy of Nutrition and Dietetics: eatright.[employer] of Diabetes and Digestive and Kidney Diseases: StageSync.si Association of Diabetes Care & Education Specialists: diabeteseducator.org Summary It is important to have healthy eating habits because your blood sugar (glucose) levels are greatly affected by what you eat and drink. It is important to use  alcohol carefully. A healthy meal plan will help you manage your blood glucose and lower your risk of heart disease. Your health care provider may recommend that you work with a dietitian to make a meal plan that is best for you. This information is not intended to replace advice given to you by your health care provider. Make sure you discuss any questions you have with your health care provider. Document Revised: 10/27/2019 Document Reviewed: 10/28/2019 Elsevier Patient Education  2024 Elsevier Inc.    Emil Schaumann, MD [facility]

## 2023-10-02 NOTE — Assessment & Plan Note (Signed)
 Uncontrolled diabetes Hemoglobin A1c at 12.1 Continue metformin  500 mg twice a day and glipizide  5 mg twice a day Continue rosuvastatin  20 mg daily Increase Jardiance  to 25 mg and start weekly GLP-1 agonist.  Recommend Mounjaro . Diet and nutrition discussed

## 2023-10-02 NOTE — Assessment & Plan Note (Signed)
 BP Readings from Last 3 Encounters:  10/02/23 118/74  09/12/23 (!) 149/80  08/21/23 (!) 143/82  Well-controlled hypertension Continue Tenoretic  50-25 mg daily Uncontrolled diabetes with hemoglobin A1c at 12.1 Cardiovascular risks associated with uncontrolled diabetes discussed Diet and nutrition discussed Benefits of exercise discussed Recommend to continue metformin  500 mg twice a day and glipizide  5 mg twice a day Increase Jardiance  to 25 mg daily and start weekly GLP-1 agonist as per insurance's formulary.  Recommend Mounjaro . Follow-up in 3 months

## 2023-10-02 NOTE — Patient Instructions (Signed)

## 2023-10-17 NOTE — Telephone Encounter (Signed)
 Patient has lvm asking for a callback in regards getting his CPAP machine.

## 2023-10-22 ENCOUNTER — Telehealth: Payer: Self-pay | Admitting: Emergency Medicine

## 2023-10-22 NOTE — Telephone Encounter (Signed)
 Contacted pt, Lvm req he review previous MC messages or call the office back to let us  know if he wishes to move forward with CPAP.

## 2023-10-22 NOTE — Telephone Encounter (Signed)
 Copied from CRM [id number]. Topic: General - Other >> Oct 22, 2023  2:48 PM Franky GRADE wrote: Reason for CRM: Patient is in need of a letter regarding a medication change for his A1C. He would like to speak with Dr.Sagardia Medical assistant as per patient she is aware of what is going on.

## 2023-10-22 NOTE — Telephone Encounter (Signed)
 Jahlen Bollman D, CMA  Joylene Carlean Jackson Macintosh; Tucker, Dolanda; Cain, Mitchell; Ziegler, Melissa New orders have been placed for the above pt, DOB: 2056-02-08 Thank

## 2023-10-22 NOTE — Telephone Encounter (Signed)
 Pt has called back in response to missed call from CMA.  The my chart message from CMA was relayed, pt wants to move forward with CPAP.

## 2023-10-24 NOTE — Telephone Encounter (Signed)
 Copied from CRM [id number]. Topic: General - Other >> Oct 24, 2023  9:13 AM Chasity T wrote: Reason for CRM: Patient is calling in because he has DOT physical coming up in [place] and needs a letter from his PCP regarding why his A1C went up and that the medication was changed. Please contact him back regarding this situation. >> Oct 24, 2023  3:57 PM Emylou G wrote: Patients wife called.. her number to call: Cassie [phone] .SABRA She is concerned since his appt Monday.. checking status of letter..  >> Oct 24, 2023  1:57 PM Franky GRADE wrote: Patient's spouse is calling to follow up on this request for the letter. He is scheduled for the DOT physical on Monday and needs the letter before than.

## 2023-10-24 NOTE — Telephone Encounter (Signed)
 Copied from CRM [id number]. Topic: General - Other >> Oct 24, 2023  9:13 AM Chasity T wrote: Reason for CRM: Patient is calling in because he has DOT physical coming up in [place] and needs a letter from his PCP regarding why his A1C went up and that the medication was changed. Please contact him back regarding this situation.

## 2023-10-24 NOTE — Telephone Encounter (Signed)
 Copied from CRM [id number]. Topic: General - Other >> Oct 24, 2023  9:13 AM Chasity T wrote: Reason for CRM: Patient is calling in because he has DOT physical coming up in [place] and needs a letter from his PCP regarding why his A1C went up and that the medication was changed. Please contact him back regarding this situation. >> Oct 24, 2023  1:57 PM Franky GRADE wrote: Patient's spouse is calling to follow up on this request for the letter. He is scheduled for the DOT physical on Monday and needs the letter before than.

## 2023-10-30 NOTE — Telephone Encounter (Signed)
 Multiple reasons for diabetes to be out of control.  Diet and nutrition, medication noncompliance, stress, etc. Medications needed to be adjusted for better control.

## 2023-11-01 ENCOUNTER — Encounter: Payer: Self-pay | Admitting: Radiology

## 2023-11-01 NOTE — Telephone Encounter (Signed)
 LVM for patient to call back regarding his DOT letter.

## 2023-11-04 NOTE — Telephone Encounter (Signed)
 Pt has called with a request for either Augustin, RN or Stephania, CMA to send a copy of his sleep test to his employer by email.  Pt provided [email or web address]  Attention Nor

## 2023-11-08 NOTE — Telephone Encounter (Signed)
Letter sent to patient Via My Chart.

## 2023-11-13 NOTE — Telephone Encounter (Signed)
 Email sent for the patient as requested

## 2023-11-13 NOTE — Telephone Encounter (Signed)
-----   Message from Olam CHRISTELLA Lather sent at 11/04/2023  9:34 AM EDT -----

## 2023-12-06 ENCOUNTER — Telehealth: Payer: Self-pay

## 2023-12-06 NOTE — Telephone Encounter (Signed)
 Patient was identified as falling into the [place] Measure - Diabetes.   Patient was: Attribution and/or data issue.  Validation/Investigation needed.  Explanation:  Patient had a last A1C check 10/02/2023, is not due for another check until  December. Has an upcoming visit with PCP in September

## 2023-12-17 ENCOUNTER — Other Ambulatory Visit: Payer: Self-pay | Admitting: Emergency Medicine

## 2023-12-17 DIAGNOSIS — E1169 Type 2 diabetes mellitus with other specified complication: Secondary | ICD-10-CM

## 2023-12-31 DIAGNOSIS — M25561 Pain in right knee: Secondary | ICD-10-CM | POA: Diagnosis not present

## 2023-12-31 DIAGNOSIS — R262 Difficulty in walking, not elsewhere classified: Secondary | ICD-10-CM | POA: Diagnosis not present

## 2023-12-31 DIAGNOSIS — M1711 Unilateral primary osteoarthritis, right knee: Secondary | ICD-10-CM | POA: Diagnosis not present

## 2023-12-31 DIAGNOSIS — M25661 Stiffness of right knee, not elsewhere classified: Secondary | ICD-10-CM | POA: Diagnosis not present

## 2024-01-06 ENCOUNTER — Ambulatory Visit: Admitting: Emergency Medicine

## 2024-01-06 ENCOUNTER — Encounter: Payer: Self-pay | Admitting: Emergency Medicine

## 2024-01-06 VITALS — BP 132/84 | HR 64 | Temp 97.6°F | Ht 68.0 in | Wt 231.0 lb

## 2024-01-06 DIAGNOSIS — E785 Hyperlipidemia, unspecified: Secondary | ICD-10-CM

## 2024-01-06 DIAGNOSIS — Z7984 Long term (current) use of oral hypoglycemic drugs: Secondary | ICD-10-CM

## 2024-01-06 DIAGNOSIS — N1831 Chronic kidney disease, stage 3a: Secondary | ICD-10-CM

## 2024-01-06 DIAGNOSIS — E1169 Type 2 diabetes mellitus with other specified complication: Secondary | ICD-10-CM | POA: Diagnosis not present

## 2024-01-06 DIAGNOSIS — E1122 Type 2 diabetes mellitus with diabetic chronic kidney disease: Secondary | ICD-10-CM

## 2024-01-06 DIAGNOSIS — I152 Hypertension secondary to endocrine disorders: Secondary | ICD-10-CM

## 2024-01-06 DIAGNOSIS — E1159 Type 2 diabetes mellitus with other circulatory complications: Secondary | ICD-10-CM | POA: Diagnosis not present

## 2024-01-06 LAB — POCT GLYCOSYLATED HEMOGLOBIN (HGB A1C): Hemoglobin A1C: 7.2 % — AB (ref 4.0–5.6)

## 2024-01-06 MED ORDER — TIRZEPATIDE 5 MG/0.5ML ~~LOC~~ SOAJ: 5.0000 mg | SUBCUTANEOUS | 3 refills | Status: AC

## 2024-01-06 NOTE — Progress Notes (Signed)
 Larry Dawson 68 y.o.   Chief Complaint  Patient presents with   Medical Management of Chronic Issues    3 month f/u    HISTORY OF PRESENT ILLNESS: This is a 68 y.o. male here for 76-month follow-up of uncontrolled diabetes Overall doing well.  Eating better Was able to start Mounjaro .  No significant side effects.  Tolerating it well No complaints or medical concerns today. Wt Readings from Last 3 Encounters:  01/06/24 231 lb (104.8 kg)  10/02/23 226 lb (102.5 kg)  09/12/23 230 lb (104.3 kg)   BP Readings from Last 3 Encounters:  01/06/24 132/84  10/02/23 118/74  09/12/23 (!) 149/80     HPI   Prior to Admission medications   Medication Sig Start Date End Date Taking? Authorizing Provider  Ascorbic Acid (VITAMIN C PO) Take by mouth.   Yes [provider]  atenolol -chlorthalidone  (TENORETIC ) 50-25 MG tablet TAKE 1 TABLET BY MOUTH DAILY 04/11/23 04/05/24 Yes Ethell Blatchford, Emil Schanz, MD  cetirizine  (ZYRTEC ) 10 MG tablet Take 1 tablet (10 mg total) by mouth daily. 05/10/17  Yes Burky, Natalie B, NP  chlorhexidine  (PERIDEX ) 0.12 % solution Use as directed 15 mLs in the mouth or throat 2 (two) times daily. 05/06/23  Yes Esteven Overfelt, Emil Schanz, MD  cholecalciferol (VITAMIN D) 1000 units tablet Take 1,000 Units by mouth daily.   Yes [provider]  cyclobenzaprine  (FLEXERIL ) 10 MG tablet Take 1 tablet (10 mg total) by mouth 3 (three) times daily as needed for muscle spasms. 12/04/21  Yes Nivia Colon, PA-C  empagliflozin  (JARDIANCE ) 25 MG TABS tablet Take 1 tablet (25 mg total) by mouth daily before breakfast. 10/02/23  Yes Katniss Weedman, Emil Schanz, MD  glipiZIDE  (GLUCOTROL ) 5 MG tablet TAKE 1 TABLET BY MOUTH 2 TIMES A DAY BEFORE A MEAL 09/15/23  Yes Sherrie Marsan, Emil Schanz, MD  ibuprofen  (ADVIL ) 600 MG tablet Take 1 tablet (600 mg total) by mouth every 6 (six) hours as needed. 12/04/21  Yes Nivia Colon, PA-C  metFORMIN  (GLUCOPHAGE ) 500 MG tablet Take 1 tablet (500 mg total) by  mouth 2 (two) times daily with a meal. 12/19/22  Yes Tamanna Whitson, Emil Schanz, MD  Multiple Vitamins-Minerals (ZINC PO) Take by mouth.   Yes [provider]  Omega-3 Fatty Acids (FISH OIL) 1000 MG CAPS Take by mouth.   Yes [provider]  rosuvastatin  (CRESTOR ) 20 MG tablet TAKE 1 TABLET BY MOUTH DAILY 12/17/23  Yes Lakeesha Fontanilla, Emil Schanz, MD  sildenafil  (VIAGRA ) 100 MG tablet Take 0.5-1 tablets (50-100 mg total) by mouth daily as needed for erectile dysfunction. 10/04/20  Yes Kenzee Bassin Jose, MD  tirzepatide  (MOUNJARO ) 2.5 MG/0.5ML Pen Inject 2.5 mg into the skin once a week. Will increase to 5 mg weekly after 3 to 4 weeks if side effects tolerated 10/02/23  Yes Elsye Mccollister, Emil Schanz, MD  Turmeric POWD by Does not apply route daily.   Yes [provider]    Allergies  Allergen Reactions   Ace Inhibitors Swelling    Suspected angioedema   Penicillins Hives, Rash and Other (See Comments)    Throat swells   Latex Hives, Swelling and Rash    Patient Active Problem List   Diagnosis Date Noted   Encounter for Department of Transportation (DOT) examination for driving license renewal [id number]/84/7974   OSA (obstructive sleep apnea) 09/22/2023   Shifting sleep-work schedule, affecting sleep 08/21/2023   At risk for obstructive sleep apnea 08/21/2023   Rash and nonspecific skin eruption 05/06/2023  Suspected sleep apnea 05/06/2023   Cheilitis 03/05/2023   Stage 3a chronic kidney disease (HCC) 12/20/2022   Lower urinary tract symptoms (LUTS) 06/26/2022   Lumbar degenerative disc disease 03/16/2021   Erectile dysfunction 10/04/2020   Dyslipidemia associated with type 2 diabetes mellitus (HCC) 08/06/2018   Dyslipidemia 08/06/2018   Hypertension associated with type 2 diabetes mellitus (HCC) 07/18/2011    Past Medical History:  Diagnosis Date   Allergy    Anal fissure    COPD (chronic obstructive pulmonary disease) (HCC)    Albuterol  PRN.   Hydrocele     Hyperlipidemia    Hypertension    IBS (irritable bowel syndrome) 05/22/13   per chart note-pt denied    Past Surgical History:  Procedure Laterality Date   HERNIA REPAIR  prior to 2013   HYDROCELE EXCISION Bilateral 11/23/2013   Procedure: HYDROCELECTOMY ADULT;  Surgeon: Alm GORMAN Fragmin, MD;  Location: [facility];  Service: Urology;  Laterality: Bilateral;   VASECTOMY  prior to 2013    Social History   Socioeconomic History   Marital status: Married    Spouse name: Not on file   Number of children: Not on file   Years of education: Not on file   Highest education level: Associate degree: occupational, Scientist, product/process development, or vocational program  Occupational History   Not on file  Tobacco Use   Smoking status: Former    Types: Cigarettes   Smokeless tobacco: Never  Vaping Use   Vaping status: Never Used  Substance and Sexual Activity   Alcohol use: Not Currently    Comment: rarely   Drug use: No   Sexual activity: Not on file  Other Topics Concern   Not on file  Social History Narrative   Marital status: married      Employment: [employer]; drives all over.  Owns company.      Tobacco:  Quit smoking in 2005; smoked 25 years      Alcohol:  Rare beer      Drugs:  None      Exercise:  None   Daily caffeine    Social Drivers of Health   Financial Resource Strain: Low Risk  (01/04/2024)   Overall Financial Resource Strain (CARDIA)    Difficulty of Paying Living Expenses: Not very hard  Food Insecurity: No Food Insecurity (01/04/2024)   Hunger Vital Sign    Worried About Running Out of Food in the Last Year: Never true    Ran Out of Food in the Last Year: Never true  Transportation Needs: No Transportation Needs (01/04/2024)   PRAPARE - Administrator, Civil Service (Medical): No    Lack of Transportation (Non-Medical): No  Physical Activity: Insufficiently Active (01/04/2024)   Exercise Vital Sign    Days of Exercise per Week: 3 days     Minutes of Exercise per Session: 40 min  Stress: No Stress Concern Present (01/04/2024)   [employer] of Occupational Health - Occupational Stress Questionnaire    Feeling of Stress: Not at all  Social Connections: Socially Integrated (01/04/2024)   Social Connection and Isolation Panel    Frequency of Communication with Friends and Family: Three times a week    Frequency of Social Gatherings with Friends and Family: Once a week    Attends Religious Services: More than 4 times per year    Active Member of [employer] or Organizations: Yes    Attends Banker Meetings: More than 4 times per year  Marital Status: Married  Catering manager Violence: Not At Risk (05/17/2023)   Humiliation, Afraid, Rape, and Kick questionnaire    Fear of Current or Ex-Partner: No    Emotionally Abused: No    Physically Abused: No    Sexually Abused: No    Family History  Problem Relation Age of Onset   Diabetes Father    Hypertension Father    Diabetes Brother    Hypertension Brother    Diabetes Brother    Diabetes Brother      Review of Systems  Constitutional: Negative.  Negative for chills and fever.  HENT: Negative.  Negative for congestion and sore throat.   Respiratory: Negative.  Negative for cough and shortness of breath.   Cardiovascular: Negative.  Negative for chest pain and palpitations.  Gastrointestinal:  Positive for constipation. Negative for abdominal pain, diarrhea, nausea and vomiting.  Genitourinary: Negative.  Negative for dysuria and hematuria.  Skin: Negative.  Negative for rash.  Neurological: Negative.  Negative for headaches.  All other systems reviewed and are negative.   Vitals:   01/06/24 0843  BP: 132/84  Pulse: 64  Temp: 97.6 F (36.4 C)  SpO2: 98%    Physical Exam Vitals reviewed.  Constitutional:      Appearance: Normal appearance.  HENT:     Head: Normocephalic.  Eyes:     Extraocular Movements: Extraocular movements intact.   Cardiovascular:     Rate and Rhythm: Normal rate.  Pulmonary:     Effort: Pulmonary effort is normal.  Abdominal:     Palpations: Abdomen is soft.     Tenderness: There is no abdominal tenderness.  Skin:    General: Skin is warm and dry.     Capillary Refill: Capillary refill takes less than 2 seconds.  Neurological:     General: No focal deficit present.     Mental Status: He is alert and oriented to person, place, and time.  Psychiatric:        Mood and Affect: Mood normal.        Behavior: Behavior normal.     Results for orders placed or performed in visit on 01/06/24 (from the past 24 hours)  POCT HgB A1C     Status: Abnormal   Collection Time: 01/06/24  8:55 AM  Result Value Ref Range   Hemoglobin A1C 7.2 (A) 4.0 - 5.6 %   HbA1c POC (<> result, manual entry)     HbA1c, POC (prediabetic range)     HbA1c, POC (controlled diabetic range)     ASSESSMENT & PLAN: A total of 42 minutes was spent with the patient and counseling/coordination of care regarding preparing for this visit, review of most recent office visit notes, review of multiple chronic medical conditions and their management, cardiovascular risks associated with diabetes and hypertension, review of all medications and changes made, review of most recent bloodwork results including interpretation of today's hemoglobin A1c, review of health maintenance items, education on nutrition, prognosis, documentation, and need for follow up.   Problem List Items Addressed This Visit       Cardiovascular and Mediastinum   Hypertension associated with type 2 diabetes mellitus (HCC) - Primary   BP Readings from Last 3 Encounters:  01/06/24 132/84  10/02/23 118/74  09/12/23 (!) 149/80  Well-controlled hypertension Continue Tenoretic  50-25 1 tablet daily Much improved hemoglobin A1c at 7.2 Tolerating Mounjaro  well Recommend increase dose to 5 mg weekly Continue Jardiance  25 mg daily and glipizide  5 mg twice a  day Continue metformin  500 mg twice a day Cardiovascular risks associated with hypertension and diabetes discussed Diet and nutrition discussed Benefits of exercise discussed Recommend follow-up in 6 months       Relevant Medications   tirzepatide  (MOUNJARO ) 5 MG/0.5ML Pen     Endocrine   Dyslipidemia associated with type 2 diabetes mellitus (HCC)   Chronic stable conditions Much improved diabetes with hemoglobin A1c of 7.2 Diet and nutrition once again discussed Continue Mounjaro , glipizide , Jardiance , and metformin  Continue rosuvastatin  20 mg daily      Relevant Medications   tirzepatide  (MOUNJARO ) 5 MG/0.5ML Pen   Other Relevant Orders   POCT HgB A1C (Completed)     Genitourinary   Stage 3a chronic kidney disease (HCC)   Advised to stay well-hydrated and avoid NSAIDs Continue Jardiance  25 mg daily        Other   Obesity, morbid (HCC)   Diet and nutrition discussed Advised to decrease amount of daily carbohydrate intake and daily calories and increase amount of plant-based protein in his diet Benefits of exercise discussed Wt Readings from Last 3 Encounters:  01/06/24 231 lb (104.8 kg)  10/02/23 226 lb (102.5 kg)  09/12/23 230 lb (104.3 kg)         Relevant Medications   tirzepatide  (MOUNJARO ) 5 MG/0.5ML Pen   Patient Instructions  Health Maintenance After Age 60 After age 53, you are at a higher risk for certain long-term diseases and infections as well as injuries from falls. Falls are a major cause of broken bones and head injuries in people who are older than age 16. Getting regular preventive care can help to keep you healthy and well. Preventive care includes getting regular testing and making lifestyle changes as recommended by your health care provider. Talk with your health care provider about: Which screenings and tests you should have. A screening is a test that checks for a disease when you have no symptoms. A diet and exercise plan that is right for  you. What should I know about screenings and tests to prevent falls? Screening and testing are the best ways to find a health problem early. Early diagnosis and treatment give you the best chance of managing medical conditions that are common after age 74. Certain conditions and lifestyle choices may make you more likely to have a fall. Your health care provider may recommend: Regular vision checks. Poor vision and conditions such as cataracts can make you more likely to have a fall. If you wear glasses, make sure to get your prescription updated if your vision changes. Medicine review. Work with your health care provider to regularly review all of the medicines you are taking, including over-the-counter medicines. Ask your health care provider about any side effects that may make you more likely to have a fall. Tell your health care provider if any medicines that you take make you feel dizzy or sleepy. Strength and balance checks. Your health care provider may recommend certain tests to check your strength and balance while standing, walking, or changing positions. Foot health exam. Foot pain and numbness, as well as not wearing proper footwear, can make you more likely to have a fall. Screenings, including: Osteoporosis screening. Osteoporosis is a condition that causes the bones to get weaker and break more easily. Blood pressure screening. Blood pressure changes and medicines to control blood pressure can make you feel dizzy. Depression screening. You may be more likely to have a fall if you have a fear of falling, feel  depressed, or feel unable to do activities that you used to do. Alcohol use screening. Using too much alcohol can affect your balance and may make you more likely to have a fall. Follow these instructions at home: Lifestyle Do not drink alcohol if: Your health care provider tells you not to drink. If you drink alcohol: Limit how much you have to: 0-1 drink a day for women. 0-2  drinks a day for men. Know how much alcohol is in your drink. In the [place] one drink equals one 12 oz bottle of beer (355 mL), one 5 oz glass of wine (148 mL), or one 1 oz glass of hard liquor (44 mL). Do not use any products that contain nicotine or tobacco. These products include cigarettes, chewing tobacco, and vaping devices, such as e-cigarettes. If you need help quitting, ask your health care provider. Activity  Follow a regular exercise program to stay fit. This will help you maintain your balance. Ask your health care provider what types of exercise are appropriate for you. If you need a cane or walker, use it as recommended by your health care provider. Wear supportive shoes that have nonskid soles. Safety  Remove any tripping hazards, such as rugs, cords, and clutter. Install safety equipment such as grab bars in bathrooms and safety rails on stairs. Keep rooms and walkways well-lit. General instructions Talk with your health care provider about your risks for falling. Tell your health care provider if: You fall. Be sure to tell your health care provider about all falls, even ones that seem minor. You feel dizzy, tiredness (fatigue), or off-balance. Take over-the-counter and prescription medicines only as told by your health care provider. These include supplements. Eat a healthy diet and maintain a healthy weight. A healthy diet includes low-fat dairy products, low-fat (lean) meats, and fiber from whole grains, beans, and lots of fruits and vegetables. Stay current with your vaccines. Schedule regular health, dental, and eye exams. Summary Having a healthy lifestyle and getting preventive care can help to protect your health and wellness after age 37. Screening and testing are the best way to find a health problem early and help you avoid having a fall. Early diagnosis and treatment give you the best chance for managing medical conditions that are more common for people who are  older than age 58. Falls are a major cause of broken bones and head injuries in people who are older than age 56. Take precautions to prevent a fall at home. Work with your health care provider to learn what changes you can make to improve your health and wellness and to prevent falls. This information is not intended to replace advice given to you by your health care provider. Make sure you discuss any questions you have with your health care provider. Document Revised: 08/15/2020 Document Reviewed: 08/15/2020 Elsevier Patient Education  2024 Elsevier Inc.      Emil Schaumann, MD [facility]

## 2024-01-06 NOTE — Assessment & Plan Note (Signed)
 Advised to stay well-hydrated and avoid NSAIDs. Continue Jardiance 25 mg daily

## 2024-01-06 NOTE — Patient Instructions (Signed)
 Health Maintenance After Age 67 After age 27, you are at a higher risk for certain long-term diseases and infections as well as injuries from falls. Falls are a major cause of broken bones and head injuries in people who are older than age 73. Getting regular preventive care can help to keep you healthy and well. Preventive care includes getting regular testing and making lifestyle changes as recommended by your health care provider. Talk with your health care provider about: Which screenings and tests you should have. A screening is a test that checks for a disease when you have no symptoms. A diet and exercise plan that is right for you. What should I know about screenings and tests to prevent falls? Screening and testing are the best ways to find a health problem early. Early diagnosis and treatment give you the best chance of managing medical conditions that are common after age 90. Certain conditions and lifestyle choices may make you more likely to have a fall. Your health care provider may recommend: Regular vision checks. Poor vision and conditions such as cataracts can make you more likely to have a fall. If you wear glasses, make sure to get your prescription updated if your vision changes. Medicine review. Work with your health care provider to regularly review all of the medicines you are taking, including over-the-counter medicines. Ask your health care provider about any side effects that may make you more likely to have a fall. Tell your health care provider if any medicines that you take make you feel dizzy or sleepy. Strength and balance checks. Your health care provider may recommend certain tests to check your strength and balance while standing, walking, or changing positions. Foot health exam. Foot pain and numbness, as well as not wearing proper footwear, can make you more likely to have a fall. Screenings, including: Osteoporosis screening. Osteoporosis is a condition that causes  the bones to get weaker and break more easily. Blood pressure screening. Blood pressure changes and medicines to control blood pressure can make you feel dizzy. Depression screening. You may be more likely to have a fall if you have a fear of falling, feel depressed, or feel unable to do activities that you used to do. Alcohol  use screening. Using too much alcohol  can affect your balance and may make you more likely to have a fall. Follow these instructions at home: Lifestyle Do not drink alcohol  if: Your health care provider tells you not to drink. If you drink alcohol : Limit how much you have to: 0-1 drink a day for women. 0-2 drinks a day for men. Know how much alcohol  is in your drink. In the [place] one drink equals one 12 oz bottle of beer (355 mL), one 5 oz glass of wine (148 mL), or one 1 oz glass of hard liquor (44 mL). Do not use any products that contain nicotine or tobacco. These products include cigarettes, chewing tobacco, and vaping devices, such as e-cigarettes. If you need help quitting, ask your health care provider. Activity  Follow a regular exercise program to stay fit. This will help you maintain your balance. Ask your health care provider what types of exercise are appropriate for you. If you need a cane or walker, use it as recommended by your health care provider. Wear supportive shoes that have nonskid soles. Safety  Remove any tripping hazards, such as rugs, cords, and clutter. Install safety equipment such as grab bars in bathrooms and safety rails on stairs. Keep rooms and walkways  well-lit. General instructions Talk with your health care provider about your risks for falling. Tell your health care provider if: You fall. Be sure to tell your health care provider about all falls, even ones that seem minor. You feel dizzy, tiredness (fatigue), or off-balance. Take over-the-counter and prescription medicines only as told by your health care provider. These include  supplements. Eat a healthy diet and maintain a healthy weight. A healthy diet includes low-fat dairy products, low-fat (lean) meats, and fiber from whole grains, beans, and lots of fruits and vegetables. Stay current with your vaccines. Schedule regular health, dental, and eye exams. Summary Having a healthy lifestyle and getting preventive care can help to protect your health and wellness after age 15. Screening and testing are the best way to find a health problem early and help you avoid having a fall. Early diagnosis and treatment give you the best chance for managing medical conditions that are more common for people who are older than age 42. Falls are a major cause of broken bones and head injuries in people who are older than age 64. Take precautions to prevent a fall at home. Work with your health care provider to learn what changes you can make to improve your health and wellness and to prevent falls. This information is not intended to replace advice given to you by your health care provider. Make sure you discuss any questions you have with your health care provider. Document Revised: 08/15/2020 Document Reviewed: 08/15/2020 Elsevier Patient Education  2024 [employer].

## 2024-01-06 NOTE — Assessment & Plan Note (Signed)
 Diet and nutrition discussed Advised to decrease amount of daily carbohydrate intake and daily calories and increase amount of plant-based protein in his diet Benefits of exercise discussed Wt Readings from Last 3 Encounters:  01/06/24 231 lb (104.8 kg)  10/02/23 226 lb (102.5 kg)  09/12/23 230 lb (104.3 kg)

## 2024-01-06 NOTE — Assessment & Plan Note (Signed)
 Chronic stable conditions Much improved diabetes with hemoglobin A1c of 7.2 Diet and nutrition once again discussed Continue Mounjaro , glipizide , Jardiance , and metformin  Continue rosuvastatin  20 mg daily

## 2024-01-06 NOTE — Assessment & Plan Note (Signed)
 BP Readings from Last 3 Encounters:  01/06/24 132/84  10/02/23 118/74  09/12/23 (!) 149/80  Well-controlled hypertension Continue Tenoretic  50-25 1 tablet daily Much improved hemoglobin A1c at 7.2 Tolerating Mounjaro  well Recommend increase dose to 5 mg weekly Continue Jardiance  25 mg daily and glipizide  5 mg twice a day Continue metformin  500 mg twice a day Cardiovascular risks associated with hypertension and diabetes discussed Diet and nutrition discussed Benefits of exercise discussed Recommend follow-up in 6 months

## 2024-01-07 DIAGNOSIS — M25561 Pain in right knee: Secondary | ICD-10-CM | POA: Diagnosis not present

## 2024-01-07 DIAGNOSIS — M1711 Unilateral primary osteoarthritis, right knee: Secondary | ICD-10-CM | POA: Diagnosis not present

## 2024-01-07 DIAGNOSIS — M25661 Stiffness of right knee, not elsewhere classified: Secondary | ICD-10-CM | POA: Diagnosis not present

## 2024-01-07 DIAGNOSIS — R262 Difficulty in walking, not elsewhere classified: Secondary | ICD-10-CM | POA: Diagnosis not present

## 2024-01-13 ENCOUNTER — Other Ambulatory Visit: Payer: Self-pay | Admitting: Emergency Medicine

## 2024-01-13 DIAGNOSIS — E1169 Type 2 diabetes mellitus with other specified complication: Secondary | ICD-10-CM

## 2024-01-13 DIAGNOSIS — E1159 Type 2 diabetes mellitus with other circulatory complications: Secondary | ICD-10-CM

## 2024-01-25 ENCOUNTER — Other Ambulatory Visit: Payer: Self-pay | Admitting: Emergency Medicine

## 2024-01-25 DIAGNOSIS — E1169 Type 2 diabetes mellitus with other specified complication: Secondary | ICD-10-CM

## 2024-01-25 DIAGNOSIS — E1159 Type 2 diabetes mellitus with other circulatory complications: Secondary | ICD-10-CM

## 2024-02-10 ENCOUNTER — Encounter: Payer: Self-pay | Admitting: Radiology

## 2024-03-16 ENCOUNTER — Emergency Department (HOSPITAL_COMMUNITY)

## 2024-03-16 ENCOUNTER — Other Ambulatory Visit: Payer: Self-pay

## 2024-03-16 ENCOUNTER — Ambulatory Visit: Payer: Self-pay

## 2024-03-16 ENCOUNTER — Inpatient Hospital Stay (HOSPITAL_COMMUNITY)
Admission: EM | Admit: 2024-03-16 | Discharge: 2024-03-18 | DRG: 065 | Disposition: A | Attending: Internal Medicine | Admitting: Internal Medicine

## 2024-03-16 DIAGNOSIS — I619 Nontraumatic intracerebral hemorrhage, unspecified: Principal | ICD-10-CM

## 2024-03-16 DIAGNOSIS — I629 Nontraumatic intracranial hemorrhage, unspecified: Secondary | ICD-10-CM

## 2024-03-16 LAB — CBC
HCT: 44.9 % (ref 39.0–52.0)
Hemoglobin: 14.7 g/dL (ref 13.0–17.0)
MCH: 27.6 pg (ref 26.0–34.0)
MCHC: 32.7 g/dL (ref 30.0–36.0)
MCV: 84.2 fL (ref 80.0–100.0)
Platelets: 260 K/uL (ref 150–400)
RBC: 5.33 MIL/uL (ref 4.22–5.81)
RDW: 12.7 % (ref 11.5–15.5)
WBC: 8.2 K/uL (ref 4.0–10.5)
nRBC: 0 % (ref 0.0–0.2)

## 2024-03-16 LAB — COMPREHENSIVE METABOLIC PANEL WITH GFR
ALT: 26 U/L (ref 0–44)
AST: 25 U/L (ref 15–41)
Albumin: 4.2 g/dL (ref 3.5–5.0)
Alkaline Phosphatase: 69 U/L (ref 38–126)
Anion gap: 11 (ref 5–15)
BUN: 12 mg/dL (ref 8–23)
CO2: 30 mmol/L (ref 22–32)
Calcium: 9.7 mg/dL (ref 8.9–10.3)
Chloride: 98 mmol/L (ref 98–111)
Creatinine, Ser: 1.17 mg/dL (ref 0.61–1.24)
GFR, Estimated: >60 mL/min (ref >60)
Glucose, Bld: 143 mg/dL — ABNORMAL HIGH (ref 70–99)
Potassium: 3.3 mmol/L — ABNORMAL LOW (ref 3.5–5.1)
Sodium: 139 mmol/L (ref 135–145)
Total Bilirubin: 1.2 mg/dL (ref 0.0–1.2)
Total Protein: 7.6 g/dL (ref 6.5–8.1)

## 2024-03-16 LAB — URINALYSIS, W/ REFLEX TO CULTURE (INFECTION SUSPECTED)
Bacteria, UA: NONE SEEN
Bilirubin Urine: NEGATIVE
Glucose, UA: NEGATIVE mg/dL
Hgb urine dipstick: NEGATIVE
Ketones, ur: NEGATIVE mg/dL
Leukocytes,Ua: NEGATIVE
Nitrite: NEGATIVE
Protein, ur: NEGATIVE mg/dL
Specific Gravity, Urine: 1.017 (ref 1.005–1.030)
pH: 6 (ref 5.0–8.0)

## 2024-03-16 LAB — TROPONIN T, HIGH SENSITIVITY: Troponin T High Sensitivity: <15 ng/L (ref 0–19)

## 2024-03-16 MED ORDER — GADOBUTROL 1 MMOL/ML IV SOLN
10.0000 mL | Freq: Once | INTRAVENOUS | Status: AC | PRN
  Administered 2024-03-16: 10 mL via INTRAVENOUS

## 2024-03-16 NOTE — ED Notes (Signed)
 Pt to MRI

## 2024-03-16 NOTE — ED Notes (Signed)
 EDP at [facility], planning for ED to ED transfer by POV for MRI

## 2024-03-16 NOTE — ED Notes (Signed)
 Out the door with wife, steady gait, no changes.

## 2024-03-16 NOTE — Consult Note (Signed)
 NEUROLOGY CONSULT NOTE   Date of service: March 16, 2024 Patient Name: Larry Dawson MRN:  [id number] DOB:  Mar 20, 1956 Chief Complaint: Sensation of feeling off-balance Requesting Provider: Dasie Faden, MD  History of Present Illness  Larry Dawson is a 68 y.o. male with a PMHx of allergy, anal fissure, COPD, hydrocele, HLD and HTN who presented to the [facility] ED today with complaints of feeling off balance for several days and a numb feeling to his head for approximately 4-5 days. The patient denied any head trauma. He reported recent labile glucose levels ranging from 300-80mg /dL. Also with urinary urgency with episodes of incontinence. He denied extremity weakness and vision issues.   CT head revealed a potential hemorrhagic mass. He was being sent over to [facility] for an MRI of the brain with and without contrast, which showed a spheroidal, well demarcated hemorrhagic lesion within the pons with minimal to no adjacent edema, suggestive of a hemorrhagic mass.    ROS  Comprehensive ROS performed and pertinent positives documented in HPI   Past History   Past Medical History:  Diagnosis Date   Allergy    Anal fissure    COPD (chronic obstructive pulmonary disease) (HCC)    Albuterol  PRN.   Hydrocele    Hyperlipidemia    Hypertension    IBS (irritable bowel syndrome) 05/22/13   per chart note-pt denied    Past Surgical History:  Procedure Laterality Date   HERNIA REPAIR  prior to 2013   HYDROCELE EXCISION Bilateral 11/23/2013   Procedure: HYDROCELECTOMY ADULT;  Surgeon: Alm GORMAN Fragmin, MD;  Location: [facility];  Service: Urology;  Laterality: Bilateral;   VASECTOMY  prior to 2013    Family History: Family History  Problem Relation Age of Onset   Diabetes Father    Hypertension Father    Diabetes Brother    Hypertension Brother    Diabetes Brother    Diabetes Brother     Social History  reports that he has quit smoking. His smoking use included  cigarettes. He has never used smokeless tobacco. He reports that he does not currently use alcohol. He reports that he does not use drugs.  Allergies  Allergen Reactions   Ace Inhibitors Swelling    Suspected angioedema   Penicillins Hives, Rash and Other (See Comments)    Throat swells   Latex Hives, Swelling and Rash    Medications  No current facility-administered medications for this encounter.  Current Outpatient Medications:    Ascorbic Acid (VITAMIN C PO), Take by mouth., Disp: , Rfl:    atenolol -chlorthalidone  (TENORETIC ) 50-25 MG tablet, TAKE 1 TABLET BY MOUTH DAILY, Disp: 90 tablet, Rfl: 3   cetirizine  (ZYRTEC ) 10 MG tablet, Take 1 tablet (10 mg total) by mouth daily., Disp: 30 tablet, Rfl: 0   chlorhexidine  (PERIDEX ) 0.12 % solution, Use as directed 15 mLs in the mouth or throat 2 (two) times daily., Disp: 473 mL, Rfl: 3   cholecalciferol (VITAMIN D) 1000 units tablet, Take 1,000 Units by mouth daily., Disp: , Rfl:    cyclobenzaprine  (FLEXERIL ) 10 MG tablet, Take 1 tablet (10 mg total) by mouth 3 (three) times daily as needed for muscle spasms., Disp: 30 tablet, Rfl: 0   empagliflozin  (JARDIANCE ) 25 MG TABS tablet, Take 1 tablet (25 mg total) by mouth daily before breakfast., Disp: 90 tablet, Rfl: 3   glipiZIDE  (GLUCOTROL ) 5 MG tablet, TAKE 1 TABLET BY MOUTH 2 TIMES A DAY BEFORE A MEAL, Disp: 60 tablet,  Rfl: 3   ibuprofen  (ADVIL ) 600 MG tablet, Take 1 tablet (600 mg total) by mouth every 6 (six) hours as needed., Disp: 30 tablet, Rfl: 0   metFORMIN  (GLUCOPHAGE ) 500 MG tablet, TAKE 1 TABLET BY MOUTH TWICE A DAY WITH A MEAL, Disp: 180 tablet, Rfl: 3   Multiple Vitamins-Minerals (ZINC PO), Take by mouth., Disp: , Rfl:    Omega-3 Fatty Acids (FISH OIL) 1000 MG CAPS, Take by mouth., Disp: , Rfl:    rosuvastatin  (CRESTOR ) 20 MG tablet, TAKE 1 TABLET BY MOUTH DAILY, Disp: 90 tablet, Rfl: 3   sildenafil  (VIAGRA ) 100 MG tablet, Take 0.5-1 tablets (50-100 mg total) by mouth daily as  needed for erectile dysfunction., Disp: 5 tablet, Rfl: 11   tirzepatide  (MOUNJARO ) 5 MG/0.5ML Pen, Inject 5 mg into the skin once a week., Disp: 6 mL, Rfl: 3   Turmeric POWD, by Does not apply route daily., Disp: , Rfl:   Vitals   Vitals:   04-06-24 2045 April 06, 2024 2154 04/06/2024 2200 April 06, 2024 2245  BP: 137/88  (!) 135/91   Pulse: 70 68 70 79  Resp: 13  16   Temp:      TempSrc:      SpO2: 100% 100% 100% 100%    There is no height or weight on file to calculate BMI.   Physical Exam   Constitutional: Appears well-developed and well-nourished.  Psych: Affect appropriate to situation.  Eyes: No scleral injection.  HENT: No OP obstruction.  Head: Normocephalic.  Respiratory: Effort normal, non-labored breathing.  Skin: WDI.   Neurologic Examination   Mental Status: Alert, oriented x 5, thought content appropriate.  Speech fluent without evidence of aphasia.  Able to follow all commands without difficulty. Cranial Nerves: II: Temporal visual fields intact with no extinction to DSS. PERRL  III,IV, VI: No ptosis. EOMI.  V: Temp sensation equal bilaterally  VII: Smile symmetric VIII: Hearing intact to voice IX,X: No hoarseness XI: Symmetric shoulder shrug XII: Midline tongue extension Motor: BUE 5/5 proximally and distally BLE 5/5 proximally and distally  No pronator drift.  Normal tone and bulk x 4.  Sensory: Temp and light touch intact throughout, bilaterally. No extinction to DSS.  Deep Tendon Reflexes: 1+ bilateral brachioradialis and biceps. Trace bilateral patellae. 0 bilateral achilles. Toes downgoing Cerebellar: No ataxia with FNF bilaterally. Normal H-S on the left. Ataxic H-S on the right.   Gait: Deferred  Labs/Imaging/Neurodiagnostic studies   CBC:  Recent Labs  Lab 04-06-24 1424  WBC 8.2  HGB 14.7  HCT 44.9  MCV 84.2  PLT 260   Basic Metabolic Panel:  Lab Results  Component Value Date   NA 139 04/06/24   K 3.3 (L) 2024/04/06   CO2 30 2024-04-06    GLUCOSE 143 (H) 04-06-24   BUN 12 04-06-24   CREATININE 1.17 2024-04-06   CALCIUM  9.7 2024/04/06   GFRNONAA >60 Apr 06, 2024   GFRAA 69 05/25/2020   Lipid Panel:  Lab Results  Component Value Date   LDLCALC 40 12/19/2022   HgbA1c:  Lab Results  Component Value Date   HGBA1C 7.2 (A) 01/06/2024   Prior CT head (12/04/21):  No acute intracranial pathology.   ASSESSMENT  68 y.o. male with a PMHx of allergy, anal fissure, COPD, hydrocele, HLD and HTN who presented to the [facility] ED today with complaints of feeling off balance for several days and a numb feeling to his head for approximately 4-5 days. The patient denied any head trauma. He reported recent labile  glucose levels ranging from 300-80mg /dL. Also with urinary urgency with episodes of incontinence. He denied extremity weakness and vision issues. CT head revealed a potential hemorrhagic mass. He was being sent over to [facility] for an MRI of the brain with and without contrast, which showed a spheroidal, well demarcated hemorrhagic lesion within the pons with minimal to no adjacent edema, suggestive of a hemorrhagic mass.  - Exam reveals RLE ataxia. No other abnormal findings appreciated.  - CT head: 1.9 x 1.8 x 1.6 cm hyperattenuating lesion in the pons concerning for hemorrhagic mass. Mild surrounding edema and mass effect. Recommend MRI with and without contrast for further evaluation. Mild expansion of the pons and no significant mass effect on the fourth ventricle. Chronic small vessel ischemic changes. 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 No visible surrounding infarct or enhancement to suggest a mass, but acute blood products limits assessment. A follow up MRI with contrast in approximately 4-6 months could exclude a mass. - EKG: Sinus rhythm Consider left atrial enlargement  Borderline T abnormalities, anterior leads Baseline wander in lead(s) - K 3.3 and glucose 143. Labs are otherwise unremarkable.  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 Lack of adjacent edema suggests that the mass-like lesion is most likely not an acute hypertensive hemorrhage.  - DDx for primary hemorrhagic brain masses includes glioblastoma, ependymoma, CNS germinoma and chordoma  - DDx for metastatic brain masses includes lung cancer, malignant melanoma, renal cell carcinoma, colorectal carcinoma, prostate cancer and hepatocellular carcinoma - Cavernous hemangioma with recent hemorrhage is unlikely given absence if any pontine lesion seen on the CT from 2023, unless it was quite small at that time and rapidly evolved over the intervening time period.    RECOMMENDATIONS  - Consider obtaining a PET scan to further characterize - CT of chest, abdomen and pelvis - Neurosurgery consult - Consider reviewing the images with Dr. Buckley of Neurooncology - DVT prophylaxis with SCDs - Avoid antiplatelet medications and anticoagulants - PT/OT/Speech - BP management ______________________________________________________________________    Larry Dawson, Ahliya Glatt, MD [facility]

## 2024-03-16 NOTE — ED Notes (Signed)
Up to b/r, steady gait 

## 2024-03-16 NOTE — ED Notes (Signed)
 Updated, no changes, resting comfortably, feel the same, family at [facility], pending CT

## 2024-03-16 NOTE — ED Notes (Signed)
 EDP at [facility]

## 2024-03-16 NOTE — ED Notes (Signed)
 MRI and charge RN notified of pt's arrival.

## 2024-03-16 NOTE — Plan of Care (Signed)
 Called by Dr. Emil at [facility] regarding this patient, who had a CT head done and the imaging study reveals a potential hemorrhagic mass.  He is being sent over for an MRI of the brain with and without contrast.  Presumably MRI is not going to happen at [facility] tonight.  Based on the MRI results, if this happens to be a primary mass with hemorrhage, he needs neurosurgical consultation. If it turns out that this is a primary ICH, then please feel free to reach out neurology for consultation.  His symptoms have been ongoing for a couple of weeks so no emergent treatment is necessary at this time. This was discussed with Dr. Emil.  Eligio Lav, MD On-call neurologist

## 2024-03-16 NOTE — ED Provider Notes (Addendum)
 Patient sent here to have MRI of brain.  He is neurologically stable at this time.  Awaiting results  10:34 PM  Patient had MRI with does show evidence of intraparenchymal hemorrhage of the pons.  Will consult neurology Dasie Faden, MD 03/16/24 VITO    Dasie Faden, MD 03/16/24 2239
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
## 2024-03-16 NOTE — ED Notes (Signed)
 Up to b/r, steady gait, wife present at side

## 2024-03-16 NOTE — Telephone Encounter (Signed)
 FYI Only or Action Required?: FYI only for provider: ED advised.  Patient was last seen in primary care on 01/06/2024 by [name], MD.  Called Nurse Triage reporting Neurologic Problem (/).  Symptoms began several days ago.  Interventions attempted: Nothing.  Symptoms are: gradually worsening.  Triage Disposition: Go to ED Now (Notify PCP)  Patient/caregiver understands and will follow disposition?: Yes   Copied from CRM [id number]. Topic: Clinical - Red Word Triage >> Mar 16, 2024 10:31 AM Avram MATSU wrote: Red Word that prompted transfer to Nurse Triage: slurred speech and overall don't feel well, balance is off and weakness Reason for Disposition  [1] Loss of bladder or bowel control (urine or bowel incontinence; wetting self, leaking stool) AND [2] new-onset  [1] Loss of speech or garbled speech AND [2] sudden onset AND [3] brief (now gone)  Answer Assessment - Initial Assessment Questions Spoke with pt and pts wife Cassie (on [place]). Reports pt had episode of mildly slurred speech and difficulty getting words out on Friday and Saturday. Resolved now. Speaking in clear full coherent sentences over the phone. Onset numbness of head, altered depth perception, feeling cold, and feeling a little off balance on Saturday. Able to walk around independently. Onset urinary incontinence 1 week ago. Denies weakness, numbness or tingling elsewhere. Denies facial droop. Denies CP. Denies hx of blood clot or CVA. CBG 307 Saturday. Takes Monjauro every Sunday. Ended up taking it on Saturday and brought CBG down to 134. Explained risks of current symptoms and advised ED now, agreeable to go. Has someone that can drive him. Advised EMS for worsening symptoms.   1. SYMPTOM: What is the main symptom you are concerned about? (e.g., weakness, numbness)     Episode of mildly slurred speech and difficulty getting words out on Friday and Saturday. Resolved now. Onset numbness of head, altered depth  perception and feeling off balance Saturday. Onset urinary incontinence 1 week ago.   2. ONSET: When did this start? (e.g., minutes, hours, days; while sleeping)     Episode of mildly slurred speech and difficulty getting words out on Saturday and Sunday. Resolved now. Onset numbness of head, alterted depth perception and feeling off balance Saturday. Onset urinary incontinence 1 week ago.   3. LAST NORMAL: When was the last time you (the patient) were normal (no symptoms)?     1 week ago  4. PATTERN Does this come and go, or has it been constant since it started?  Is it present now?     Mildly slurred speech and difficulty getting words out onset on Saturday, resolved now. Rest of symptoms constant.  5. CARDIAC SYMPTOMS: Have you had any of the following symptoms: chest pain, difficulty breathing, palpitations?     Denies  6. NEUROLOGIC SYMPTOMS: Have you had any of the following symptoms: headache, dizziness, vision loss, double vision, changes in speech, unsteady on your feet?     Denies dizziness or blurry vision  7. OTHER SYMPTOMS: Do you have any other symptoms?     Denies  Protocols used: Neurologic Deficit-A-AH

## 2024-03-16 NOTE — ED Triage Notes (Signed)
 Pt ambulatory to triage with complaints of feeling off balance for several days, and a numb feeling to his head for approx 4-5 days. PT denies trauma to the head. Pt reports recent labile glucose levels ranging from 300-80mg /dL. And urinary urgency with episodes of incontinence.   Denies extremity weakness, denies vision issues.
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
## 2024-03-17 ENCOUNTER — Inpatient Hospital Stay (HOSPITAL_COMMUNITY)

## 2024-03-17 ENCOUNTER — Encounter (HOSPITAL_COMMUNITY): Payer: Self-pay | Admitting: Internal Medicine

## 2024-03-17 DIAGNOSIS — Q279 Congenital malformation of peripheral vascular system, unspecified: Secondary | ICD-10-CM | POA: Diagnosis not present

## 2024-03-17 DIAGNOSIS — Z7985 Long-term (current) use of injectable non-insulin antidiabetic drugs: Secondary | ICD-10-CM | POA: Diagnosis not present

## 2024-03-17 DIAGNOSIS — E876 Hypokalemia: Secondary | ICD-10-CM | POA: Diagnosis present

## 2024-03-17 DIAGNOSIS — Z7984 Long term (current) use of oral hypoglycemic drugs: Secondary | ICD-10-CM | POA: Diagnosis not present

## 2024-03-17 DIAGNOSIS — Z833 Family history of diabetes mellitus: Secondary | ICD-10-CM | POA: Diagnosis not present

## 2024-03-17 DIAGNOSIS — Z8673 Personal history of transient ischemic attack (TIA), and cerebral infarction without residual deficits: Secondary | ICD-10-CM | POA: Diagnosis not present

## 2024-03-17 DIAGNOSIS — J449 Chronic obstructive pulmonary disease, unspecified: Secondary | ICD-10-CM | POA: Diagnosis present

## 2024-03-17 DIAGNOSIS — E785 Hyperlipidemia, unspecified: Secondary | ICD-10-CM | POA: Diagnosis present

## 2024-03-17 DIAGNOSIS — Z8249 Family history of ischemic heart disease and other diseases of the circulatory system: Secondary | ICD-10-CM | POA: Diagnosis not present

## 2024-03-17 DIAGNOSIS — R3915 Urgency of urination: Secondary | ICD-10-CM | POA: Diagnosis present

## 2024-03-17 DIAGNOSIS — E119 Type 2 diabetes mellitus without complications: Secondary | ICD-10-CM | POA: Diagnosis present

## 2024-03-17 DIAGNOSIS — R4781 Slurred speech: Secondary | ICD-10-CM | POA: Diagnosis present

## 2024-03-17 DIAGNOSIS — Z79899 Other long term (current) drug therapy: Secondary | ICD-10-CM | POA: Diagnosis not present

## 2024-03-17 DIAGNOSIS — R911 Solitary pulmonary nodule: Secondary | ICD-10-CM | POA: Diagnosis present

## 2024-03-17 DIAGNOSIS — R42 Dizziness and giddiness: Secondary | ICD-10-CM | POA: Diagnosis not present

## 2024-03-17 DIAGNOSIS — N179 Acute kidney failure, unspecified: Secondary | ICD-10-CM | POA: Diagnosis present

## 2024-03-17 DIAGNOSIS — Z880 Allergy status to penicillin: Secondary | ICD-10-CM | POA: Diagnosis not present

## 2024-03-17 DIAGNOSIS — I10 Essential (primary) hypertension: Secondary | ICD-10-CM | POA: Diagnosis present

## 2024-03-17 DIAGNOSIS — R278 Other lack of coordination: Secondary | ICD-10-CM | POA: Diagnosis present

## 2024-03-17 DIAGNOSIS — I613 Nontraumatic intracerebral hemorrhage in brain stem: Secondary | ICD-10-CM | POA: Diagnosis present

## 2024-03-17 DIAGNOSIS — E1169 Type 2 diabetes mellitus with other specified complication: Secondary | ICD-10-CM | POA: Diagnosis not present

## 2024-03-17 DIAGNOSIS — Z888 Allergy status to other drugs, medicaments and biological substances status: Secondary | ICD-10-CM | POA: Diagnosis not present

## 2024-03-17 DIAGNOSIS — Z6834 Body mass index (BMI) 34.0-34.9, adult: Secondary | ICD-10-CM | POA: Diagnosis not present

## 2024-03-17 DIAGNOSIS — I629 Nontraumatic intracranial hemorrhage, unspecified: Secondary | ICD-10-CM | POA: Diagnosis present

## 2024-03-17 DIAGNOSIS — G9389 Other specified disorders of brain: Secondary | ICD-10-CM | POA: Diagnosis not present

## 2024-03-17 DIAGNOSIS — Z91040 Latex allergy status: Secondary | ICD-10-CM | POA: Diagnosis not present

## 2024-03-17 DIAGNOSIS — Z87891 Personal history of nicotine dependence: Secondary | ICD-10-CM | POA: Diagnosis not present

## 2024-03-17 DIAGNOSIS — E66811 Obesity, class 1: Secondary | ICD-10-CM | POA: Diagnosis present

## 2024-03-17 DIAGNOSIS — I639 Cerebral infarction, unspecified: Secondary | ICD-10-CM | POA: Diagnosis not present

## 2024-03-17 DIAGNOSIS — I619 Nontraumatic intracerebral hemorrhage, unspecified: Secondary | ICD-10-CM | POA: Diagnosis not present

## 2024-03-17 LAB — ECHOCARDIOGRAM COMPLETE
AR max vel: 2.41 cm2
AV Area VTI: 2.27 cm2
AV Area mean vel: 2.4 cm2
AV Mean grad: 3 mmHg
AV Peak grad: 5.4 mmHg
Ao pk vel: 1.16 m/s
Area-P 1/2: 3.21 cm2
S' Lateral: 3.8 cm

## 2024-03-17 LAB — GLUCOSE, CAPILLARY
Glucose-Capillary: 102 mg/dL — ABNORMAL HIGH (ref 70–99)
Glucose-Capillary: 146 mg/dL — ABNORMAL HIGH (ref 70–99)

## 2024-03-17 LAB — CBG MONITORING, ED: Glucose-Capillary: 179 mg/dL — ABNORMAL HIGH (ref 70–99)

## 2024-03-17 MED ORDER — CHLORTHALIDONE 25 MG PO TABS
25.0000 mg | ORAL_TABLET | Freq: Every day | ORAL | Status: DC
  Filled 2024-03-17: qty 1

## 2024-03-17 MED ORDER — PANTOPRAZOLE SODIUM 40 MG IV SOLR: 40.0000 mg | Freq: Every day | INTRAVENOUS | Status: DC

## 2024-03-17 MED ORDER — STROKE: EARLY STAGES OF RECOVERY BOOK
Freq: Once | Status: AC
  Filled 2024-03-17: qty 1

## 2024-03-17 MED ORDER — INSULIN ASPART 100 UNIT/ML IJ SOLN: 0.0000 [IU] | Freq: Every day | INTRAMUSCULAR | Status: DC

## 2024-03-17 MED ORDER — IOHEXOL 350 MG/ML SOLN
75.0000 mL | Freq: Once | INTRAVENOUS | Status: AC | PRN
  Administered 2024-03-17: 75 mL via INTRAVENOUS

## 2024-03-17 MED ORDER — ACETAMINOPHEN 650 MG RE SUPP: 650.0000 mg | Freq: Four times a day (QID) | RECTAL | Status: DC | PRN

## 2024-03-17 MED ORDER — ATENOLOL-CHLORTHALIDONE 50-25 MG PO TABS: 1.0000 | ORAL_TABLET | Freq: Every day | ORAL | Status: DC

## 2024-03-17 MED ORDER — OXYCODONE HCL 5 MG PO TABS: 5.0000 mg | ORAL_TABLET | ORAL | Status: DC | PRN

## 2024-03-17 MED ORDER — POTASSIUM CHLORIDE 20 MEQ PO PACK
40.0000 meq | PACK | Freq: Once | ORAL | Status: AC
  Administered 2024-03-17: 40 meq via ORAL
  Filled 2024-03-17: qty 2

## 2024-03-17 MED ORDER — INSULIN ASPART 100 UNIT/ML IJ SOLN
0.0000 [IU] | Freq: Three times a day (TID) | INTRAMUSCULAR | Status: DC
  Administered 2024-03-17: 2 [IU] via SUBCUTANEOUS
  Administered 2024-03-18: 1 [IU] via SUBCUTANEOUS
  Administered 2024-03-18: 2 [IU] via SUBCUTANEOUS
  Filled 2024-03-17 (×2): qty 2
  Filled 2024-03-17: qty 1

## 2024-03-17 MED ORDER — ONDANSETRON HCL 4 MG PO TABS: 4.0000 mg | ORAL_TABLET | Freq: Four times a day (QID) | ORAL | Status: DC | PRN

## 2024-03-17 MED ORDER — ENOXAPARIN SODIUM 40 MG/0.4ML IJ SOSY: 40.0000 mg | PREFILLED_SYRINGE | INTRAMUSCULAR | Status: DC

## 2024-03-17 MED ORDER — PANTOPRAZOLE SODIUM 40 MG PO TBEC
40.0000 mg | DELAYED_RELEASE_TABLET | Freq: Every day | ORAL | Status: DC
  Administered 2024-03-17 – 2024-03-18 (×2): 40 mg via ORAL
  Filled 2024-03-17 (×2): qty 1

## 2024-03-17 MED ORDER — ATENOLOL 50 MG PO TABS
50.0000 mg | ORAL_TABLET | Freq: Every day | ORAL | Status: DC
  Administered 2024-03-17 – 2024-03-18 (×2): 50 mg via ORAL
  Filled 2024-03-17: qty 1
  Filled 2024-03-17: qty 2

## 2024-03-17 MED ORDER — ONDANSETRON HCL 4 MG/2ML IJ SOLN: 4.0000 mg | Freq: Four times a day (QID) | INTRAMUSCULAR | Status: DC | PRN

## 2024-03-17 MED ORDER — ACETAMINOPHEN 325 MG PO TABS: 650.0000 mg | ORAL_TABLET | Freq: Four times a day (QID) | ORAL | Status: DC | PRN

## 2024-03-17 MED ORDER — ALUM & MAG HYDROXIDE-SIMETH 200-200-20 MG/5ML PO SUSP
15.0000 mL | Freq: Four times a day (QID) | ORAL | Status: DC | PRN
  Administered 2024-03-17: 15 mL via ORAL
  Filled 2024-03-17: qty 30

## 2024-03-17 NOTE — H&P (Signed)
 History and Physical    Larry Dawson FMW:[id number] DOB: 07-11-1955 DOA: 03/16/2024  PCP: [name], MD   Chief Complaint: abnortmal imaging  HPI: Larry Dawson is a 68 y.o. male with medical history significant of hypertension, hyperlipidemia, COPD who presents emergency department due to dizziness.  Patient's had intermittent episodes of dizziness particular when driving as well as feeling unsteady on his feet.  He presented to the ER for further assessment.  On arrival he was afebrile and hemodynamically stable.  At [place] [facility] he had a CT head which showed possible hemorrhagic mass the patient was transferred to [facility] for.  Labs were obtained which demonstrated CMP unrevealing, CBC unrevealing, troponin less than 15, urinalysis negative for infection.  MR brain was obtained which showed stable intraparenchymal hemorrhage in the pons.  Neurology was consulted and recommended admission for further workup.  On admission patient endorsed feeling dizzy but denied other complaints   Review of Systems: Review of Systems  Constitutional: Negative.   HENT: Negative.    Eyes: Negative.   Respiratory: Negative.    Cardiovascular: Negative.   Gastrointestinal: Negative.   Genitourinary: Negative.   Musculoskeletal: Negative.   Skin: Negative.   Neurological:  Positive for dizziness and headaches.  Endo/Heme/Allergies: Negative.      As per HPI otherwise 10 point review of systems negative.   Allergies  Allergen Reactions   Ace Inhibitors Swelling    Suspected angioedema   Penicillins Anaphylaxis, Hives, Rash and Other (See Comments)    Throat swells   Latex Hives, Swelling and Rash    Past Medical History:  Diagnosis Date   Allergy    Anal fissure    COPD (chronic obstructive pulmonary disease) (HCC)    Albuterol  PRN.   Hydrocele    Hyperlipidemia    Hypertension    IBS (irritable bowel syndrome) 05/22/13   per chart note-pt denied    Past Surgical  History:  Procedure Laterality Date   HERNIA REPAIR  prior to 2013   HYDROCELE EXCISION Bilateral 11/23/2013   Procedure: HYDROCELECTOMY ADULT;  Surgeon: Alm GORMAN Fragmin, MD;  Location: [facility];  Service: Urology;  Laterality: Bilateral;   VASECTOMY  prior to 2013     reports that he has quit smoking. His smoking use included cigarettes. He has never used smokeless tobacco. He reports that he does not currently use alcohol. He reports that he does not use drugs.  Family History  Problem Relation Age of Onset   Diabetes Father    Hypertension Father    Diabetes Brother    Hypertension Brother    Diabetes Brother    Diabetes Brother     Prior to Admission medications   Medication Sig Start Date End Date Taking? Authorizing Provider  Ascorbic Acid (VITAMIN C PO) Take 1 tablet by mouth daily.   Yes [provider]  atenolol -chlorthalidone  (TENORETIC ) 50-25 MG tablet TAKE 1 TABLET BY MOUTH DAILY 04/11/23 04/05/24 Yes Sagardia, Emil Schanz, MD  cetirizine  (ZYRTEC ) 10 MG tablet Take 1 tablet (10 mg total) by mouth daily. 05/10/17  Yes Burky, Natalie B, NP  cholecalciferol (VITAMIN D) 1000 units tablet Take 1,000 Units by mouth daily.   Yes [provider]  glipiZIDE  (GLUCOTROL ) 5 MG tablet TAKE 1 TABLET BY MOUTH 2 TIMES A DAY BEFORE A MEAL 01/25/24  Yes Sagardia, Emil Schanz, MD  metFORMIN  (GLUCOPHAGE ) 500 MG tablet TAKE 1 TABLET BY MOUTH TWICE A DAY WITH A MEAL 01/13/24  Yes  [name], MD  Multiple Vitamins-Minerals (ZINC PO) Take 1 tablet by mouth daily.   Yes [provider]  Omega-3 Fatty Acids (FISH OIL) 1000 MG CAPS Take 1 capsule by mouth daily.   Yes [provider]  sildenafil  (VIAGRA ) 100 MG tablet Take 0.5-1 tablets (50-100 mg total) by mouth daily as needed for erectile dysfunction. 10/04/20  Yes Sagardia, Emil Schanz, MD  tirzepatide  (MOUNJARO ) 5 MG/0.5ML Pen Inject 5 mg into the skin once a week. 01/06/24  Yes Sagardia,  Emil Schanz, MD  Turmeric POWD Take by mouth daily.   Yes [provider]  rosuvastatin  (CRESTOR ) 20 MG tablet TAKE 1 TABLET BY MOUTH DAILY Patient not taking: Reported on 03/17/2024 12/17/23   [name], MD    Physical Exam: Vitals:   03/16/24 2200 03/16/24 2245 03/17/24 0045 03/17/24 0145  BP: (!) 135/91  121/86 134/82  Pulse: 70 79 77 72  Resp: 16  20 17   Temp:      TempSrc:      SpO2: 100% 100% 99% 100%   Physical Exam Constitutional:      Appearance: He is normal weight.  HENT:     Head: Normocephalic.     Nose: Nose normal.     Mouth/Throat:     Mouth: Mucous membranes are moist.     Pharynx: Oropharynx is clear.  Eyes:     Conjunctiva/sclera: Conjunctivae normal.     Pupils: Pupils are equal, round, and reactive to light.  Cardiovascular:     Rate and Rhythm: Normal rate and regular rhythm.     Pulses: Normal pulses.     Heart sounds: Normal heart sounds.  Pulmonary:     Effort: Pulmonary effort is normal.     Breath sounds: Normal breath sounds.  Abdominal:     General: Abdomen is flat. Bowel sounds are normal.     Palpations: Abdomen is soft.  Musculoskeletal:        General: Normal range of motion.     Cervical back: Normal range of motion.  Skin:    General: Skin is warm.     Capillary Refill: Capillary refill takes less than 2 seconds.  Neurological:     General: No focal deficit present.     Mental Status: He is alert. Mental status is at baseline.  Psychiatric:        Mood and Affect: Mood normal.        Labs on Admission: I have personally reviewed the patients's labs and imaging studies.  Assessment/Plan Principal Problem:   Intracranial hemorrhage (HCC)   # Dizziness most likely secondary to hemorrhagic mass - Patient endorsed dizziness and presented to outside hospital where CT was obtained which showed hemorrhage in the pons - Neurology consulted and recommended MRI which showed possible hemorrhagic mass in the  pons  Plan: Appreciate neurology recommendations PT/OT  # Hypertension-continue atenolol , chlorthalidone   # Hypokalemia-replete potassium   Admission status: Inpatient Telemetry  Certification: The appropriate patient status for this patient is INPATIENT. Inpatient status is judged to be reasonable and necessary in order to provide the required intensity of service to ensure the patient's safety. The patient's presenting symptoms, physical exam findings, and initial radiographic and laboratory data in the context of their chronic comorbidities is felt to place them at high risk for further clinical deterioration. Furthermore, it is not anticipated that the patient will be medically stable for discharge from the hospital within 2 midnights of admission.   * I  certify that at the point of admission it is my clinical judgment that the patient will require inpatient hospital care spanning beyond 2 midnights from the point of admission due to high intensity of service, high risk for further deterioration and high frequency of surveillance required.DEWAINE Lamar Dess MD [facility] If 7PM-7AM, please contact night-coverage [email or web address]  03/17/2024, 2:17 AM

## 2024-03-17 NOTE — Progress Notes (Signed)
   Brief Progress Note   _____________________________________________________________________________________________________________  Patient Name: Larry Dawson Patient DOB: 1955/12/19 Date: @TODAY @      Data: Reviewed labs, vital signs, and notes.    Action: No action required at this time.    Response:    _____________________________________________________________________________________________________________  [facility] RN [facility] [address] Jarion Hawthorne Please contact us  directly via secure chat (search for [facility]) or by calling us  at [phone] [facility]).

## 2024-03-17 NOTE — Plan of Care (Signed)
  Problem: Education: Goal: Knowledge of patient specific risk factors will improve (DELETE if not current risk factor) Outcome: Progressing   Problem: Health Behavior/Discharge Planning: Goal: Ability to manage health-related needs will improve Outcome: Progressing Goal: Goals will be collaboratively established with patient/family Outcome: Progressing

## 2024-03-17 NOTE — Progress Notes (Addendum)
 PROGRESS NOTE  Larry Dawson  DOB: Dec 15, 1955  PCP: [name], MD FMW:[id number]  DOA: 03/16/2024  LOS: 0 days  Hospital Day: 2  Subjective: Patient was seen and examined this morning. Pleasant elderly African-American male.  Propped up in bed.  Not in distress.  Dizziness improving. We discussed about the CT scan and MRI findings. Remains afebrile, hemodynamically stable with blood pressure in 120s   Brief narrative: Larry Dawson is a 68 y.o. male with PMH significant for DM2, HTN, HLD, COPD. For the last 2 weeks, patient has been feeling lightheaded, off balance.  He reports chronically difficult to control blood pressure and recent labile blood glucose level.   On 12/8, wife noticed slurring of speech.  He called his PCPs office and as recommended, patient presented to ED at [facility]. No history of trauma  In the ED, afebrile, heart rate in 60s, blood pressure 152/85, breathing on room air Initial labs with unremarkable CBC, potassium low at 3.3, glucose elevated to 143, troponin normal Urinalysis unremarkable EKG with normal sinus rhythm at 67 bpm, QTc 432 ms. CT head showed -1.9 x 1.8 x 1.6 cm hyperattenuating lesion in the pons concerning for hemorrhagic mass with mild surrounding edema and mass effect. - Chronic as well as extremities and remote lacunar infarcts in the bilateral basal ganglia and thalami and remote cortical infarcts in the bilateral parietal lobes  MRI brain showed stable intraparenchymal hemorrhage in the pons with no visible surrounding infarct or enhancement to suggest a mass.  Recommended follow-up MRI with contrast in 4 to 6 months to exclude a mass.  Neurology was consulted Admitted to [facility] at [facility]  Assessment and plan: Acute pontine hemorrhage Presented with insidious symptoms of lightheadedness, dizziness for 2 weeks and a sudden onset of slurring of speech on the day of presentation CT head and MRI brain as above suggestive of  1.9 cm size pontine hemorrhage. Neurology was consulted Per neurologist Dr. Voncile, he discussed the case and images with neurosurgeon Dr. [name].  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  Lack of adjacent edema suggest that the masslike lesion is most likely not an acute hypertensive hemorrhage..   Patient could be having a primary hemorrhagic brain mass or metastatic brain mass from remote primary.   As assisted, I have requested for CT chest, abdomen pelvis with contrast.  Stroke workup initiated Obtain echo, carotid duplex A1c 7.2 in September 2025 Obtain lipid panel, TSH PT/OT eval Passed bedside swallow screen in the ED Dizziness improving.  May need meclizine as needed if symptoms persist Neurology to follow Was not on any antiplatelet or anticoagulant  Hypertension Patient reports difficult to control blood pressure.  Patient probably has chronic uncontrolled blood pressure given his imaging findings of acute and chronic brainstem infarcts. PTA meds- atenolol  50 mg daily, chlorthalidone  25 mg daily  Currently blood pressure seems to spontaneously controlled 122 and 130s Okay to continue atenolol  but I would hold chlorthalidone  for now  Type 2 diabetes mellitus A1c 7.2 12/28/2023 PTA meds-Mounjaro  weekly, glipizide  5 mg twice daily, metformin  500 mg twice daily. Currently on SSI/Accu-Cheks Recent Labs  Lab 03/17/24 1144  GLUCAP 179*    HLD Does not seem to be taking Crestor  Pending repeat lipid panel Lipid Panel     Component Value Date/Time   CHOL 109 12/19/2022 1414   CHOL 115 11/25/2019 1117   TRIG 116.0  12/19/2022 1414   HDL 45.20 12/19/2022 1414   HDL 46 11/25/2019 1117   CHOLHDL 2 12/19/2022 1414   VLDL 23.2 12/19/2022 1414   LDLCALC 40 12/19/2022 1414   LDLCALC 56 11/25/2019 1117   LDLDIRECT 68.0 12/06/2021 1420   LABVLDL 13 11/25/2019 1117   COPD Used to smoke  in the past.  Does not need any inhalers on a regular basis  Hypokalemia Potassium level was low at 3.3.  Replacement was given Recent Labs  Lab 03/16/24 1424  K 3.3*    Nutrition Status:         Mobility: Previously independent.  Pending PT eval  PT Orders: Active   PT Follow up Rec: No Pt Follow Up12/12/2023 1030    Goals of care   Code Status: Full Code     DVT prophylaxis:  Place and maintain sequential compression device Start: 03/17/24 0139 SCDs Start: 03/17/24 0125   Antimicrobials: None Fluid: None Consultants: Neurology Family Communication: None at bedside  Status: Inpatient Level of care:  Telemetry   Patient is from: Home Needs to continue in-hospital care: Stroke workup needed Anticipated d/c to: Hopefully home in 1 to 2 days, pending PT eval    Diet:  Diet Order             Diet heart healthy/carb modified Fluid consistency: Thin  Diet effective now                   Scheduled Meds:  [START ON 03/18/2024]  stroke: early stages of recovery book   Does not apply Once   atenolol   50 mg Oral Daily   insulin  aspart  0-5 Units Subcutaneous QHS   insulin  aspart  0-9 Units Subcutaneous TID WC    PRN meds: acetaminophen  **OR** acetaminophen , ondansetron  **OR** ondansetron  (ZOFRAN ) IV, oxyCODONE    Infusions:    Antimicrobials: Anti-infectives (From admission, onward)    None       Objective: Vitals:   03/17/24 1100 03/17/24 1200  BP: (!) 142/81   Pulse: 74 71  Resp: 17 19  Temp:    SpO2: 100% 100%   No intake or output data in the 24 hours ending 03/17/24 1245 There were no vitals filed for this visit. Weight change:  There is no height or weight on file to calculate BMI.   Physical Exam: General exam: Pleasant, elderly African-American male.  Not in pain Skin: No rashes, lesions or ulcers. HEENT: Atraumatic, normocephalic, no obvious bleeding Lungs: Clear to auscultation bilaterally,  CVS: S1, S2, no murmur,    GI/Abd: Soft, nontender, nondistended, bowel sound present,   CNS: Alert, awake Monito x 3. Psychiatry: Mood appropriate Extremities: No pedal edema, no calf tenderness,   Data Review: I have personally reviewed the laboratory data and studies available.  F/u labs ordered Unresulted Labs (From admission, onward)     Start     Ordered   03/18/24 0500  Lipid panel  (Labs)  Tomorrow morning,   R       Comments: Fasting    03/17/24 0932   03/18/24 0500  TSH  Tomorrow morning,   R        03/17/24 0933   03/18/24 0500  Basic metabolic panel with GFR  Tomorrow morning,   R        03/17/24 0941            Signed, Chapman Rota, MD [facility] 03/17/2024

## 2024-03-17 NOTE — Progress Notes (Signed)
 Echocardiogram Attempted exam at 205pm - patient being taken to Vascular  Juliene JINNY Rucks 03/17/2024, 2:11 PM

## 2024-03-17 NOTE — Plan of Care (Signed)
 Patient seen and examined.  No change from the exam from Dr. Lindzen. Discussed the case with neurosurgery and sent a message to neurointerventional radiology for possible cath angio in a couple of weeks followed by neurosurgical evaluation by Dr. Janjua for possible minimally invasive options-not a candidate for open neurosurgery. Will follow  Maitri Schnoebelen, MD

## 2024-03-17 NOTE — ED Notes (Signed)
 Pt to vascular.

## 2024-03-17 NOTE — ED Provider Notes (Signed)
 Appears stable, awake and alert looking at his phone.  Blood pressures remained stable, currently in the 130s systolic.  He has been seen by Dr. Merrianne with neurology who recommends hospitalist admission  Discussed with Dr. Dena for admission   Midge Golas, MD 03/17/24 [phone]

## 2024-03-17 NOTE — Progress Notes (Signed)
 Echocardiogram 2D Echocardiogram has been performed.  Larry Dawson 03/17/2024, 4:33 PM

## 2024-03-17 NOTE — Evaluation (Signed)
 Physical Therapy Evaluation Patient Details Name: Larry Dawson MRN: [id number] DOB: [birth year] Today's Date: 03/17/2024  History of Present Illness  68 yo M adm 12/8  with dizziness,  CT possible hemorrhagic mass the patient was transferred to [facility] MRI (+) stable intraparenchymal hemorrhage in the pons PMH HTN HLD COPD IBS Hernia repair  Clinical Impression  Pt admitted with above diagnosis. PTA pt lived at home with wife, independent and working as a pharmacologist. Pt currently with functional limitations due to the deficits listed below (see PT Problem List). On eval, pt demo independent bed mobility and transfers. Supervision amb 400' without AD. No LOB but pt reporting dizziness with head turns. Strength intact and symmetrical. Pt will benefit from acute skilled PT to increase their independence and safety with mobility to allow discharge. PT to follow acutely to further assess balance and stairs. No follow up services indicated.          If plan is discharge home, recommend the following: Assistance with cooking/housework;Help with stairs or ramp for entrance   Can travel by private vehicle        Equipment Recommendations None recommended by PT  Recommendations for Other Services       Functional Status Assessment Patient has had a recent decline in their functional status and demonstrates the ability to make significant improvements in function in a reasonable and predictable amount of time.     Precautions / Restrictions Precautions Precautions: None Recall of Precautions/Restrictions: Intact      Mobility  Bed Mobility Overal bed mobility: Independent                  Transfers Overall transfer level: Independent Equipment used: None                    Ambulation/Gait Ambulation/Gait assistance: Supervision Gait Distance (Feet): 400 Feet Assistive device: None Gait Pattern/deviations: Step-through pattern Gait velocity: WFL Gait velocity  interpretation: >2.62 ft/sec, indicative of community ambulatory   General Gait Details: steady gait without AD. Pt reports dizziness with head turns. No LOB noted.  Stairs            Wheelchair Mobility     Tilt Bed    Modified Rankin (Stroke Patients Only)       Balance Overall balance assessment: Mild deficits observed, not formally tested                                           Pertinent Vitals/Pain Pain Assessment Pain Assessment: No/denies pain    Home Living Family/patient expects to be discharged to:: Private residence Living Arrangements: Spouse/significant other Available Help at Discharge: Family;Available 24 hours/day Type of Home: House Home Access: Stairs to enter Entrance Stairs-Rails: Right Entrance Stairs-Number of Steps: 4   Home Layout: Two level;Bed/bath upstairs Home Equipment: None      Prior Function Prior Level of Function : Independent/Modified Independent;Working/employed;Driving               ADLs Comments: retired Mining Engineer, now works as a Adult Nurse Extremity Assessment: Overall WFL for tasks assessed    Lower Extremity Assessment Lower Extremity Assessment: Overall WFL for tasks assessed    Cervical / Trunk Assessment Cervical / Trunk Assessment: Normal  Communication   Communication Communication: No apparent  difficulties    Cognition Arousal: Alert Behavior During Therapy: WFL for tasks assessed/performed   PT - Cognitive impairments: No apparent impairments                         Following commands: Intact       Cueing Cueing Techniques: Verbal cues     General Comments General comments (skin integrity, edema, etc.): VSS on RA    Exercises     Assessment/Plan    PT Assessment Patient needs continued PT services  PT Problem List Decreased balance;Decreased mobility;Decreased activity  tolerance       PT Treatment Interventions Therapeutic activities;Gait training;Therapeutic exercise;Patient/family education;Stair training;Balance training;Functional mobility training    PT Goals (Current goals can be found in the Care Plan section)  Acute Rehab PT Goals Patient Stated Goal: home PT Goal Formulation: With patient Time For Goal Achievement: 03/31/24 Potential to Achieve Goals: Good    Frequency Min 2X/week     Co-evaluation               AM-PAC PT 6 Clicks Mobility  Outcome Measure Help needed turning from your back to your side while in a flat bed without using bedrails?: None Help needed moving from lying on your back to sitting on the side of a flat bed without using bedrails?: None Help needed moving to and from a bed to a chair (including a wheelchair)?: None Help needed standing up from a chair using your arms (e.g., wheelchair or bedside chair)?: None Help needed to walk in hospital room?: A Little Help needed climbing 3-5 steps with a railing? : A Little 6 Click Score: 22    End of Session Equipment Utilized During Treatment: Gait belt Activity Tolerance: Patient tolerated treatment well Patient left: in chair;with call bell/phone within reach Nurse Communication: Mobility status PT Visit Diagnosis: Unsteadiness on feet (R26.81)    Time: 9070-9056 PT Time Calculation (min) (ACUTE ONLY): 14 min   Charges:   PT Evaluation $PT Eval Moderate Complexity: 1 Mod   PT General Charges $$ ACUTE PT VISIT: 1 Visit         Sari MATSU., PT  Office # [phone]   Erven Sari Shaker 03/17/2024, 10:32 AM

## 2024-03-18 ENCOUNTER — Inpatient Hospital Stay (HOSPITAL_COMMUNITY)

## 2024-03-18 ENCOUNTER — Telehealth: Payer: Self-pay | Admitting: Pulmonary Disease

## 2024-03-18 ENCOUNTER — Other Ambulatory Visit (HOSPITAL_COMMUNITY): Payer: Self-pay

## 2024-03-18 DIAGNOSIS — G9389 Other specified disorders of brain: Secondary | ICD-10-CM

## 2024-03-18 DIAGNOSIS — I629 Nontraumatic intracranial hemorrhage, unspecified: Secondary | ICD-10-CM | POA: Diagnosis not present

## 2024-03-18 DIAGNOSIS — E1169 Type 2 diabetes mellitus with other specified complication: Secondary | ICD-10-CM

## 2024-03-18 DIAGNOSIS — E785 Hyperlipidemia, unspecified: Secondary | ICD-10-CM

## 2024-03-18 DIAGNOSIS — I619 Nontraumatic intracerebral hemorrhage, unspecified: Secondary | ICD-10-CM

## 2024-03-18 LAB — BASIC METABOLIC PANEL WITH GFR
Anion gap: 6 (ref 5–15)
BUN: 11 mg/dL (ref 8–23)
CO2: 32 mmol/L (ref 22–32)
Calcium: 9 mg/dL (ref 8.9–10.3)
Chloride: 100 mmol/L (ref 98–111)
Creatinine, Ser: 1.52 mg/dL — ABNORMAL HIGH (ref 0.61–1.24)
GFR, Estimated: 50 mL/min — ABNORMAL LOW (ref >60)
Glucose, Bld: 114 mg/dL — ABNORMAL HIGH (ref 70–99)
Potassium: 3.4 mmol/L — ABNORMAL LOW (ref 3.5–5.1)
Sodium: 138 mmol/L (ref 135–145)

## 2024-03-18 LAB — GLUCOSE, CAPILLARY
Glucose-Capillary: 140 mg/dL — ABNORMAL HIGH (ref 70–99)
Glucose-Capillary: 165 mg/dL — ABNORMAL HIGH (ref 70–99)

## 2024-03-18 LAB — LIPID PANEL
Cholesterol: 107 mg/dL (ref 0–200)
HDL: 38 mg/dL — ABNORMAL LOW (ref >40)
LDL Cholesterol: 56 mg/dL (ref 0–99)
Total CHOL/HDL Ratio: 2.8 ratio
Triglycerides: 67 mg/dL (ref <150)
VLDL: 13 mg/dL (ref 0–40)

## 2024-03-18 LAB — TSH: TSH: 1.147 u[IU]/mL (ref 0.350–4.500)

## 2024-03-18 MED ORDER — IOHEXOL 350 MG/ML SOLN
75.0000 mL | Freq: Once | INTRAVENOUS | Status: AC | PRN
  Administered 2024-03-18: 75 mL via INTRAVENOUS

## 2024-03-18 MED ORDER — ATENOLOL 50 MG PO TABS
50.0000 mg | ORAL_TABLET | Freq: Every day | ORAL | 1 refills | Status: DC
  Filled 2024-03-18: qty 30, 30d supply, fill #0

## 2024-03-18 NOTE — Progress Notes (Signed)
 Physical Therapy Treatment and DISCHARGE Patient Details Name: Larry Dawson MRN: [id number] DOB: [birth year] Today's Date: 03/18/2024   History of Present Illness 68 yo M adm 12/8  with dizziness,  CT possible hemorrhagic mass the patient was transferred to [facility] MRI (+) stable intraparenchymal hemorrhage in the pons PMH HTN HLD COPD IBS Hernia repair.    PT Comments  Pt demonstrates improved balance and no dizziness throughout mobility this session. Pt ambulates the unit without LOB and tolerates community level distances. Stairs trialed per home set up, pt uses single rail for additional support to ascend and descend stairs and is able to do so safely. Head turns assessed while ambulating, pt's pace decreases but no LOB occurs and dizziness (-). Balance assessed with narrow BOS, pt demonstrates difficulty with L tandem stance but romberg and R tandem stance WNL. Education provided regarding using cane for additional support as needed during ambulation and symptom monitoring. Pt reports no further mobility concerns at this time and PT goals have been met. Discharging from PT at this time, reconsult as needed for mobility changes.     If plan is discharge home, recommend the following: A little help with walking and/or transfers (Wife at home and can provide assist as needed)   Can travel by private vehicle        Equipment Recommendations  None recommended by PT    Recommendations for Other Services       Precautions / Restrictions Precautions Precautions: None Restrictions Weight Bearing Restrictions Per Provider Order: No     Mobility  Bed Mobility Overal bed mobility: Independent             General bed mobility comments: Pt safe with bed mobility, no concerns.    Transfers Overall transfer level: Independent Equipment used: None               General transfer comment: Pt steady upon standing without DME, demonstrates good strength required for transfer.     Ambulation/Gait Ambulation/Gait assistance: Independent Gait Distance (Feet): 300 Feet Assistive device: None Gait Pattern/deviations: WFL(Within Functional Limits)   Gait velocity interpretation: >4.37 ft/sec, indicative of normal walking speed   General Gait Details: Pt does not veer and has no LOB with ambulation. Pt pace decreases with head turns but does not veer from line of progression. No dizziness noted with head turns this session.   Stairs Stairs: Yes Stairs assistance: Modified independent (Device/Increase time) Stair Management: One rail Right, Alternating pattern, Forwards Number of Stairs: 10 General stair comments: Pt demonstrates safe mechanics on stairs.   Wheelchair Mobility     Tilt Bed    Modified Rankin (Stroke Patients Only) Modified Rankin (Stroke Patients Only) Pre-Morbid Rankin Score: No symptoms Modified Rankin: No significant disability     Balance Overall balance assessment: Needs assistance Sitting-balance support: No upper extremity supported Sitting balance-Leahy Scale: Normal Sitting balance - Comments: No concerns for sitting balance.   Standing balance support: No upper extremity supported Standing balance-Leahy Scale: Good Standing balance comment: Pt steady in static stance, increased postural sway noted with decreased BOS but pt is able to correct by using single upper extremity support on a stable surface as needed.     Tandem Stance - Right Leg: 30 Tandem Stance - Left Leg: 10 Rhomberg - Eyes Opened: 30 Rhomberg - Eyes Closed: 30                Communication Communication Communication: No apparent difficulties  Cognition Arousal: Alert Behavior  During Therapy: WFL for tasks assessed/performed   PT - Cognitive impairments: No apparent impairments                         Following commands: Intact      Cueing Cueing Techniques: Verbal cues, Visual cues  Exercises      General Comments General  comments (skin integrity, edema, etc.): No significant skin abnormalities noted. Cranial nerve screen WNL, assessed due to facial numbness reported leading to admission.      Pertinent Vitals/Pain Pain Assessment Pain Assessment: No/denies pain    Home Living                          Prior Function            PT Goals (current goals can now be found in the care plan section) Progress towards PT goals: Goals met/education completed, patient discharged from PT    Frequency     (Discharging from PT services)      PT Plan      Co-evaluation              AM-PAC PT 6 Clicks Mobility   Outcome Measure  Help needed turning from your back to your side while in a flat bed without using bedrails?: None Help needed moving from lying on your back to sitting on the side of a flat bed without using bedrails?: None Help needed moving to and from a bed to a chair (including a wheelchair)?: None Help needed standing up from a chair using your arms (e.g., wheelchair or bedside chair)?: None Help needed to walk in hospital room?: None Help needed climbing 3-5 steps with a railing? : None 6 Click Score: 24    End of Session   Activity Tolerance: Patient tolerated treatment well Patient left: in chair;with call bell/phone within reach Nurse Communication: Mobility status PT Visit Diagnosis: Unsteadiness on feet (R26.81)     Time: 9042-8981 PT Time Calculation (min) (ACUTE ONLY): 21 min  Charges:    $Gait Training: 8-22 mins PT General Charges $$ ACUTE PT VISIT: 1 Visit                     Sabra Morel, PT, DPT  Acute Rehabilitation Services         Office: [phone]     Sabra MARLA Morel 03/18/2024, 11:31 AM

## 2024-03-18 NOTE — Plan of Care (Signed)

## 2024-03-18 NOTE — Discharge Summary (Signed)
 PATIENT DETAILS Name: Larry Dawson Age: 68 y.o. Sex: male Date of Birth: Mar 26, 1956 MRN: [id number]. Admitting Physician: Lamar Dess, MD ERE:[id number], Emil Schanz, MD  Admit Date: 03/16/2024 Discharge date: 03/18/2024  Recommendations for Outpatient Follow-up:  Follow up with PCP in 1-2 weeks Please obtain CMP/CBC in one week Please ensure follow-up with stroke clinic at [facility], [facility] neurosurgery-Dr. Gildardo repeat CT head Please ensure follow-up with pulmonology clinic-has a enlarging lung nodule.  Admitted From:  Home  Disposition: Home   Discharge Condition: good  CODE STATUS:   Code Status: Full Code   Diet recommendation:  Diet Order             Diet - low sodium heart healthy           Diet Carb Modified           Diet heart healthy/carb modified Fluid consistency: Thin  Diet effective now                    Brief Summary: 68 year old with HTN, HLD, DM-2, COPD-who presented feeling lightheaded-off balance-upon further evaluation-he was found to have either a ICH or a hemorrhagic mass.  See below for further details  Significant studies 9/29>> A1c: 7.2 12/8>> CT head: 1.9 x 1.8 x 1.6 hypoattenuating lesion in the pons concerning for hemorrhagic mass. 12/8>> MRI brain: Stable intraparenchymal hemorrhage in the pons without visible surrounding infarct or enhancement to suggest mass. 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 12/9>> B/L carotid Doppler: No significant stenosis 12/9>> echo: EF 50-55%. 12/10>> LDL 56 12/10>> CTA head/neck: No stenosis/aneurysm   Brief Hospital Course: [place] ICH versus hemorrhagic mass Overall better-ambulating independently Neurology-discussed extensively with interventional radiology and neurosurgery-recommendations are for repeat CT head in around 8 weeks, and to follow-up with GI needs to clinic and with [facility]  neurosurgery-Dr. [name]. Per neuro-stable for discharge-blood pressure has been adequately controlled this morning.  Enlarging right upper lobe lung nodule Discussed with PCCM PA-C-Paul Hoffman-he will try to get follow-up appointment as an outpatient. PCP to ensure outpatient follow-up with pulmonology for further continued workup.  HTN Blood pressure seems to be well-controlled with atenolol -chlorthalidone  continues to be on hold  AKI Mild Stable for close outpatient monitoring with PCP Continue to hold chlorthalidone  for now.  HLD LDL stable-not taking Crestor -since LDL at goal-okay to hold off-and follow-up with PCP.  DM-2 Resume oral hypoglycemics/outpatient regimen post discharge  Class 1 Obesity: Estimated body mass index is 34.77 kg/m as calculated from the following:   Height as of this encounter: 5' 7.5 (1.715 m).   Weight as of this encounter: 102.2 kg.    Discharge Diagnoses:  Principal Problem:   Intracranial hemorrhage [facility])  Discharge Instructions:  Activity:  As tolerated   Discharge Instructions     Ambulatory referral to Neurology   Complete by: As directed    An appointment is requested in approximately: 4 weeks   Ambulatory referral to Neurosurgery   Complete by: As directed    Please Select To Department: [facility] for Nerve or Spine  Please select To Department: [facility] AT [place] for Cranial or Neurovascular   Diet - low sodium heart healthy   Complete by: As directed    Diet Carb Modified   Complete by: As directed    Discharge instructions   Complete by: As directed    Follow with Primary MD  [name], MD in 1-2  weeks  Please follow-up with neurology, neurosurgery, pulmonology-their office will give you a call-with a follow-up appointment.  Your blood pressure is currently just controlled with atenolol -continue to hold chlorthalidone  for now.  You have incidental finding-you have a small lung  nodule in the right upper lobe of the right lung-pulmonology clinic will call you with a follow-up appointment-it is important that you follow-up with them-in some cases-these nodules can turn cancerous.  Please get a complete blood count and chemistry panel checked by your Primary MD at your next visit, and again as instructed by your Primary MD.  Get Medicines reviewed and adjusted: Please take all your medications with you for your next visit with your Primary MD  Laboratory/radiological data: Please request your Primary MD to go over all hospital tests and procedure/radiological results at the follow up, please ask your Primary MD to get all Hospital records sent to his/her office.  In some cases, they will be blood work, cultures and biopsy results pending at the time of your discharge. Please request that your primary care M.D. follows up on these results.  Also Note the following: If you experience worsening of your admission symptoms, develop shortness of breath, life threatening emergency, suicidal or homicidal thoughts you must seek medical attention immediately by calling 911 or calling your MD immediately  if symptoms less severe.  You must read complete instructions/literature along with all the possible adverse reactions/side effects for all the Medicines you take and that have been prescribed to you. Take any new Medicines after you have completely understood and accpet all the possible adverse reactions/side effects.   Do not drive when taking Pain medications or sleeping medications (Benzodaizepines)  Do not take more than prescribed Pain, Sleep and Anxiety Medications. It is not advisable to combine anxiety,sleep and pain medications without talking with your primary care practitioner  Special Instructions: If you have smoked or chewed Tobacco  in the last 2 yrs please stop smoking, stop any regular Alcohol  and or any Recreational drug use.  Wear Seat belts while  driving.  Please note: You were cared for by a hospitalist during your hospital stay. Once you are discharged, your primary care physician will handle any further medical issues. Please note that NO REFILLS for any discharge medications will be authorized once you are discharged, as it is imperative that you return to your primary care physician (or establish a relationship with a primary care physician if you do not have one) for your post hospital discharge needs so that they can reassess your need for medications and monitor your lab values.   Increase activity slowly   Complete by: As directed       Allergies as of 03/18/2024       Reactions   Ace Inhibitors Swelling   Suspected angioedema   Penicillins Anaphylaxis, Hives, Rash, Other (See Comments)   Throat swells   Latex Hives, Swelling, Rash        Medication List     STOP taking these medications    atenolol -chlorthalidone  50-25 MG tablet Commonly known as: TENORETIC    rosuvastatin  20 MG tablet Commonly known as: CRESTOR        TAKE these medications    atenolol  50 MG tablet Commonly known as: TENORMIN  Take 1 tablet (50 mg total) by mouth daily. Start taking on: March 19, 2024   cetirizine  10 MG tablet Commonly known as: ZYRTEC  Take 1 tablet (10 mg total) by mouth daily.   cholecalciferol 1000 units tablet Commonly  known as: VITAMIN D Take 1,000 Units by mouth daily.   Fish Oil 1000 MG Caps Take 1 capsule by mouth daily.   glipiZIDE  5 MG tablet Commonly known as: GLUCOTROL  TAKE 1 TABLET BY MOUTH 2 TIMES A DAY BEFORE A MEAL   metFORMIN  500 MG tablet Commonly known as: GLUCOPHAGE  TAKE 1 TABLET BY MOUTH TWICE A DAY WITH A MEAL   sildenafil  100 MG tablet Commonly known as: Viagra  Take 0.5-1 tablets (50-100 mg total) by mouth daily as needed for erectile dysfunction.   tirzepatide  5 MG/0.5ML Pen Commonly known as: MOUNJARO  Inject 5 mg into the skin once a week.   Turmeric Powd Take by mouth  daily.   VITAMIN C PO Take 1 tablet by mouth daily.   ZINC PO Take 1 tablet by mouth daily.        Follow-up Information     [name], MD. Schedule an appointment as soon as possible for a visit in 1 week(s).   Specialty: Internal Medicine Contact information: [address] [place] [address] [phone]         [facility] Follow up.   Specialty: Neurology Why: Office will call with date/time, If you dont hear from them,please give them a call Contact information: [address] [place]   [address] [phone]        [name] Dino HERO, MD Follow up.   Specialty: Neurosurgery Why: Office will call with date/time, If you dont hear from them,please give them a call Contact information: [address] [place] [address] [place] [address] [phone]                Allergies  Allergen Reactions   Ace Inhibitors Swelling    Suspected angioedema   Penicillins Anaphylaxis, Hives, Rash and Other (See Comments)    Throat swells   Latex Hives, Swelling and Rash     Other Procedures/Studies:  TODAY-DAY OF DISCHARGE:  Subjective:   Larry Dawson today has no headache,no chest abdominal pain,no new weakness tingling or numbness, feels much better wants to go home today.   Objective:   Blood pressure 125/74, pulse 64, temperature 98 F (36.7 C), temperature source Oral, resp. rate 15, height 5' 7.5 (1.715 m), weight 102.2 kg, SpO2 98%. No intake or output data in the 24 hours ending 03/18/24 1253 Filed Weights   03/17/24 2000  Weight: 102.2 kg    Exam: Awake Alert, Oriented *3, No new F.N deficits, Normal affect [place].AT,PERRAL Supple Neck,No JVD, No cervical lymphadenopathy appriciated.  Symmetrical Chest wall movement, Good air movement bilaterally, CTAB RRR,No Gallops,Rubs or new Murmurs, No Parasternal Heave +ve B.Sounds, Abd Soft, Non tender, No organomegaly appriciated, No  rebound -guarding or rigidity. No Cyanosis, Clubbing or edema, No new Rash or bruise   PERTINENT RADIOLOGIC STUDIES:    PERTINENT LAB RESULTS: CBC: Recent Labs    03/16/24 1424  WBC 8.2  HGB 14.7  HCT 44.9  PLT 260   CMET CMP     Component Value Date/Time   NA 138 03/18/2024 0350   NA 139 05/25/2020 1114   K 3.4 (L) 03/18/2024 0350   CL 100 03/18/2024 0350   CO2 32 03/18/2024 0350   GLUCOSE 114 (H) 03/18/2024 0350   BUN 11 03/18/2024 0350   BUN 20 05/25/2020 1114   CREATININE 1.52 (H) 03/18/2024 0350   CREATININE 1.27 11/19/2015 1212   CALCIUM  9.0 03/18/2024 0350   PROT 7.6 03/16/2024 1424   PROT  7.3 05/25/2020 1114   ALBUMIN 4.2 03/16/2024 1424   ALBUMIN 4.4 05/25/2020 1114   AST 25 03/16/2024 1424   ALT 26 03/16/2024 1424   ALKPHOS 69 03/16/2024 1424   BILITOT 1.2 03/16/2024 1424   BILITOT 1.4 (H) 05/25/2020 1114   GFR 56.55 (L) 10/02/2023 1508   GFRNONAA 50 (L) 03/18/2024 0350   GFRNONAA 61 11/19/2015 1212    GFR Estimated Creatinine Clearance: 54.2 mL/min (A) (by C-G formula based on SCr of 1.52 mg/dL (H)). No results for input(s): LIPASE, AMYLASE in the last 72 hours. No results for input(s): CKTOTAL, CKMB, CKMBINDEX, TROPONINI in the last 72 hours. Invalid input(s): POCBNP No results for input(s): DDIMER in the last 72 hours. No results for input(s): HGBA1C in the last 72 hours. Recent Labs    03/18/24 0350  CHOL 107  HDL 38*  LDLCALC 56  TRIG 67  CHOLHDL 2.8   Recent Labs    03/18/24 0350  TSH 1.147   No results for input(s): VITAMINB12, FOLATE, FERRITIN, TIBC, IRON, RETICCTPCT in the last 72 hours. Coags: No results for input(s): INR in the last 72 hours.  Invalid input(s): PT Microbiology: No results found for this or any previous visit (from the past 240 hours).  FURTHER DISCHARGE INSTRUCTIONS:  Get Medicines reviewed and adjusted: Please take all your medications with you for your next visit  with your Primary MD  Laboratory/radiological data: Please request your Primary MD to go over all hospital tests and procedure/radiological results at the follow up, please ask your Primary MD to get all Hospital records sent to his/her office.  In some cases, they will be blood work, cultures and biopsy results pending at the time of your discharge. Please request that your primary care M.D. goes through all the records of your hospital data and follows up on these results.  Also Note the following: If you experience worsening of your admission symptoms, develop shortness of breath, life threatening emergency, suicidal or homicidal thoughts you must seek medical attention immediately by calling 911 or calling your MD immediately  if symptoms less severe.  You must read complete instructions/literature along with all the possible adverse reactions/side effects for all the Medicines you take and that have been prescribed to you. Take any new Medicines after you have completely understood and accpet all the possible adverse reactions/side effects.   Do not drive when taking Pain medications or sleeping medications (Benzodaizepines)  Do not take more than prescribed Pain, Sleep and Anxiety Medications. It is not advisable to combine anxiety,sleep and pain medications without talking with your primary care practitioner  Special Instructions: If you have smoked or chewed Tobacco  in the last 2 yrs please stop smoking, stop any regular Alcohol  and or any Recreational drug use.  Wear Seat belts while driving.  Please note: You were cared for by a hospitalist during your hospital stay. Once you are discharged, your primary care physician will handle any further medical issues. Please note that NO REFILLS for any discharge medications will be authorized once you are discharged, as it is imperative that you return to your primary care physician (or establish a relationship with a primary care physician  if you do not have one) for your post hospital discharge needs so that they can reassess your need for medications and monitor your lab values.  Total Time spent coordinating discharge including counseling, education and face to face time equals greater than 30 minutes.  SignedBETHA Donalda Applebaum 03/18/2024 12:53 PM

## 2024-03-18 NOTE — Plan of Care (Signed)
°  Problem: Education: Goal: Ability to describe self-care measures that may prevent or decrease complications (Diabetes Survival Skills Education) will improve Outcome: Progressing   Problem: Coping: Goal: Ability to adjust to condition or change in health will improve Outcome: Progressing   Problem: Health Behavior/Discharge Planning: Goal: Ability to identify and utilize available resources and services will improve Outcome: Progressing Goal: Ability to manage health-related needs will improve Outcome: Progressing   Problem: Nutritional: Goal: Maintenance of adequate nutrition will improve Outcome: Progressing   Problem: Education: Goal: Knowledge of secondary prevention will improve (MUST DOCUMENT ALL) Outcome: Not Progressing Goal: Knowledge of patient specific risk factors will improve (DELETE if not current risk factor) Outcome: Not Progressing   Problem: Education: Goal: Knowledge of patient specific risk factors will improve (DELETE if not current risk factor) Outcome: Not Progressing   Problem: Ischemic Stroke/TIA Tissue Perfusion: Goal: Complications of ischemic stroke/TIA will be minimized Outcome: Progressing

## 2024-03-18 NOTE — Progress Notes (Signed)
 OT Cancellation Note  Patient Details Name: MAIJOR HORNIG MRN: [id number] DOB: April 03, 1956   Cancelled Treatment:    Reason Eval/Treat Not Completed: OT screened, no needs identified, will sign off  Leita Howell, OTR/L,CBIS  Supplemental OT - [facility] and [facility] Secure Chat Preferred   03/18/2024, 11:32 AM

## 2024-03-18 NOTE — TOC Transition Note (Signed)
 Transition of Care [facility]) - Discharge Note   Patient Details  Name: Larry Dawson MRN: [id number] Date of Birth: 11-02-1955  [facility] of [facility]) CM/SW Contact:  Landry DELENA Senters, RN Phone Number: 03/18/2024, 1:18 PM   Clinical Narrative:     Patient lives at home with his wife and daughter, his daughter will transport patient home today. Wife and daughter provide support at home and help with transportation needs. DME reviewed--no needs identified. Patient has PCP, manages own medications.  No further needs identified by therapy.   During CM conversation, patient and daughter requested clarification about diagnosis and plan of treatment. CM did contact Dr. Raenelle, who came back to clarify needed information with patient and daughter.  No further needs identified by CM.  Final next level of care: Home/Self Care Barriers to Discharge: No Barriers Identified   Patient Goals and CMS Choice            Discharge Placement                       Discharge Plan and Services Additional resources added to the After Visit Summary for                                       Social Drivers of Health (SDOH) Interventions SDOH Screenings   Food Insecurity: No Food Insecurity (03/17/2024)  Housing: Low Risk  (03/17/2024)  Transportation Needs: No Transportation Needs (03/17/2024)  Utilities: Not At Risk (03/17/2024)  Alcohol Screen: Low Risk  (01/04/2024)  Depression (PHQ2-9): Low Risk  (05/17/2023)  Financial Resource Strain: Low Risk  (01/04/2024)  Physical Activity: Insufficiently Active (01/04/2024)  Social Connections: Socially Integrated (03/17/2024)  Stress: No Stress Concern Present (01/04/2024)  Tobacco Use: Medium Risk (03/17/2024)  Health Literacy: Adequate Health Literacy (05/17/2023)     Readmission Risk Interventions     No data to display

## 2024-03-18 NOTE — Progress Notes (Addendum)
 NEUROLOGY CONSULT FOLLOW UP NOTE   Date of service: March 18, 2024 Patient Name: Larry Dawson MRN:  [id number] DOB:  02/04/1956  Interval Hx/subjective  Patient seen and examined CT head, CT chest abdomen pelvis, MRI brain reviewed. Discussed with neurosurgery and neurointerventional radiology. No clear vascular lesion identified on CT head and MRI brain with contrast. 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  Will need follow-up imaging after resolution of the blood to determine the true etiology, if at all.  Vitals   Vitals:   03/17/24 2000 03/18/24 0411 03/18/24 0530 03/18/24 0736  BP: 133/80 98/65 102/64 117/77  Pulse: 75 75 70 70  Resp: 20 (!) 21 15   Temp: 98.5 F (36.9 C) 98.5 F (36.9 C) 98.4 F (36.9 C) 97.8 F (36.6 C)  TempSrc: Oral Oral Oral Oral  SpO2: 98%  98%   Weight: 102.2 kg     Height: 5' 7.5 (1.715 m)        Body mass index is 34.77 kg/m.  Physical Exam   General Awake alert in no distress HEENT: Normocephalic atraumatic Lungs: Clear Cardiovascular: Regular rhythm Neurological exam Awake alert oriented x 3 No dysarthria No aphasia Cranial nerves II to XII intact Motor examination with no drift in any of the 4 extremities Sensation intact to light touch Coordination exam: Ataxic heel-knee-shin on the right. Has been walking with therapists-stable gait per report  Medications  Current Facility-Administered Medications:     stroke: early stages of recovery book, , Does not apply, Once, Dahal, Binaya, MD   acetaminophen  (TYLENOL ) tablet 650 mg, 650 mg, Oral, Q6H PRN **OR** acetaminophen  (TYLENOL ) suppository 650 mg, 650 mg, Rectal, Q6H PRN, Dena Charleston, MD   alum & mag hydroxide-simeth (MAALOX/MYLANTA) 200-200-20 MG/5ML suspension 15 mL, 15 mL, Oral, Q6H PRN, Dahal, Chapman, MD, 15 mL at 03/17/24 1804   atenolol  (TENORMIN ) tablet 50 mg, 50 mg, Oral, Daily, 50 mg at 03/17/24 1010 **AND** [DISCONTINUED]  chlorthalidone  (HYGROTON ) tablet 25 mg, 25 mg, Oral, Daily, Dahal, Binaya, MD   insulin  aspart (novoLOG ) injection 0-5 Units, 0-5 Units, Subcutaneous, QHS, Dahal, Binaya, MD   insulin  aspart (novoLOG ) injection 0-9 Units, 0-9 Units, Subcutaneous, TID WC, Dahal, Binaya, MD, 2 Units at 03/17/24 1148   ondansetron  (ZOFRAN ) tablet 4 mg, 4 mg, Oral, Q6H PRN **OR** ondansetron  (ZOFRAN ) injection 4 mg, 4 mg, Intravenous, Q6H PRN, Dorrell, Robert, MD   oxyCODONE  (Oxy IR/ROXICODONE ) immediate release tablet 5 mg, 5 mg, Oral, Q4H PRN, Dorrell, Robert, MD   pantoprazole  (PROTONIX ) EC tablet 40 mg, 40 mg, Oral, QAC breakfast, Dahal, Binaya, MD, 40 mg at 03/17/24 1857  Labs and Diagnostic Imaging   CBC:  Recent Labs  Lab 03/16/24 1424  WBC 8.2  HGB 14.7  HCT 44.9  MCV 84.2  PLT 260    Basic Metabolic Panel:  Lab Results  Component Value Date   NA 138 03/18/2024   K 3.4 (L) 03/18/2024   CO2 32 03/18/2024   GLUCOSE 114 (H) 03/18/2024   BUN 11 03/18/2024   CREATININE 1.52 (H) 03/18/2024   CALCIUM  9.0 03/18/2024   GFRNONAA 50 (L) 03/18/2024   GFRAA 69 05/25/2020   Lipid Panel:  Lab Results  Component Value Date   LDLCALC 56 03/18/2024   HgbA1c:  Lab Results  Component Value Date   HGBA1C 7.2 (A) 01/06/2024    Personally reviewed imaging: CT head: 0.9 x 1.8 x 1.6 cm hyperattenuating lesion in the pons concerning for hemorrhagic mass.  Mild  surrounding edema and mass effect.  MRI recommended for further evaluation MRI of the brain with and without contrast redemonstrates the hemorrhagic lesion-no visible surrounding infarct or enhancement to suggest mass but blood product limits assessment.  Follow-up MRI in 4 to 6 months recommended to exclude mass. CT chest abdomen pelvis with a 10 mm right upper lobe ground glass nodule increased from 7 mm 2 years ago.  Radiology reading recommends thoracic oncology referral.  I discussed with neuroradiologist/neurointerventional radiologist, were of  the opinion that this looks stable.  Stable sub-5 mm pulmonary nodules without suspicious features consistent with benign etiology.  Steatosis.  Prostatic enlargement.  Three-vessel coronary artery calcifications interval increased in the left main coronary artery.   Assessment   Larry AMBROCIO is a 68 y.o. male Past history of allergy, COPD, hypertension, hyperlipidemia who presented to the hospital with complaints of feeling off balance for multiple days and a numb feeling in his head for about 4 to 5 days.  No head trauma.  Glucose levels have been labile recently.  Examination with mild right sided lower extremity dysmetria. CT head with a lesion in the pons with hemorrhagic features-hemorrhagic mass versus ICH-MRI of the brain done, which redemonstrates this mass but unable to provide clarity as to whether this is an underlying vascular malformation versus primary ICH versus mass.  Presence of blood products precludes clear answers at this time. Malignancy workup with right upper lobe lung nodule with mild increase in size which according to the neuroradiologist that I personally discussed the case with seems to be similar to size than before. At this point differentials of his pontine lesion are hemorrhagic mass versus ICH. His symptoms have been ongoing for multiple days to weeks, hence acute blood pressure management not required ICH score 1 for infratentorial origin of the mass if this turns out eventually to be an ICH.   Recommendations  As discussed above, discussed with neurointerventional radiology and neurosurgery.  Recommend outpatient follow-up in 2 to 3 months with repeat imaging. I would recommend referral to [facility] neurology stroke clinic for 8 to 12 weeks as well as [facility] neurosurgery, Dr. [name], in 8 to 12 weeks.  Repeat CT can be done at that time. I would recommend checking with pulmonology to see if they would like to proceed with the right upper lobe findings  outpatient. Blood pressure management-eventual blood pressure goal outpatient would be normotension. A1c goal less than 7.  He is currently 7.2.  Management per primary team His LDL is at goal of 56. He is not taking his rosuvastatin  at home.  With his LDL at goal, okay to hold off.  I would recommend rechecking his lipid panel in 6 months to reassess the need for statins for goal LDL less than 70. I would hold off on any antiplatelets or anticoagulants at this time and would not start until he has been evaluated with repeat scanning by the outpatient providers. Therapy assessments CTA head and neck has been ordered to complete the stroke risk factor workup-I will follow-up on that with you.  Plan discussed with Dr. Raenelle. ______________________________________________________________________   Bonney Eligio Lav, MD [facility]    Addendum CTA head and neck: no acute findings. Plan as above.

## 2024-03-19 ENCOUNTER — Ambulatory Visit: Payer: Self-pay

## 2024-03-19 ENCOUNTER — Telehealth: Payer: Self-pay | Admitting: *Deleted

## 2024-03-19 NOTE — Telephone Encounter (Signed)
 FYI Only or Action Required?: FYI only for provider: appointment scheduled on 03/23/24.  Patient was last seen in primary care on 01/06/2024 by [name], MD.  Called Nurse Triage reporting Numbness and Fatigue.  Symptoms began several weeks ago.  Interventions attempted: Rest, hydration, or home remedies.  Symptoms are: gradually worsening.  Triage Disposition: Call EMS 911 Now  Patient/caregiver understands and will follow disposition?: No, refuses disposition    Copied from CRM #[id number]. Topic: Clinical - Red Word Triage >> Mar 19, 2024  1:23 PM [name] wrote: Red Word that prompted transfer to Nurse Triage: Patient called in stating that he was released from the hospital yesterday and is having worsening symptoms. His head is hurting really bad, the hospital stated he might have had a stroke and there could be a possible brain bleed. He said he was released from the hospital yesterday and told to follow up with his pcp but he still is not feeling good. He also stated he is experiencing extreme fatigue. Reason for Disposition  [1] Numbness (i.e., loss of sensation) of the face, arm / hand, or leg / foot on one side of the body AND [2] sudden onset AND [3] present now  Answer Assessment - Initial Assessment Questions Pt admitted from 12/8 - 12/10 for lightheadedness, feeling off balance head numbness and mildly slurred speech. ED notes state pt either had ICH or a hemorrhagic mass. MRI showed hemorrhage in the pons. Pt states he had a brain bleed but that neuro unsure if he had a stroke. Discharged and told to wait 4-6 weeks for blood to dissipate to do f/u head CT to see if pt had CVA. Referral sent to neurology.   Pt calling to schedule f/u appt. Reports head still feels fatigued, numb in his head and still mildly slurring speech. Speech sounds slightly slurred over the phone to this RN. Overall speaking in clear coherent full sentences. Symptoms have not worsened.   Reports football sized area on left thigh that's felt numb and itchy for past few years. Worse today. New onset mouth numbness today. Denies feeling paralyzed in any of his extremities and able to walk around fine. Denies changes to vision.  Advised calling 911 d/t new onset numbness in mouth and worse numbness in left thigh. Pt declines, stated he just discharged from hospital yesterday. States neuro told him he may experience more numbness over the next 4-6 weeks until the blood dissipates to be able to re-evaluate if pt had a stroke or not. Called [facility] and spoke with Hadassah to notify of 911 refusal and relay what pt states neuro told him about possibility of developing new areas of numbness over the next 4-6 weeks. Unable to work pt in for sooner hospital f/u appt at home office, ok  to schedule soonest appt on Monday 12/15 with PCP. Advised pt to call 911 or return to ED for worsening symptoms. Pt verbalized understanding.    1. SYMPTOM: What is the main symptom you are concerned about? (e.g., weakness, numbness)     Head and mouth numbness. Severe numbness and itching of left thigh. Lethargy.   2. ONSET: When did this start? (e.g., minutes, hours, days; while sleeping)     Head numbness and lethargy before going to the hospital. Numbness and itching of left thigh onset several years ago. Worse today. Mouth numbness onset this morning.  3. LAST NORMAL: When was the last time you (the patient) were normal (no symptoms)?  2-3 weeks before going to the hospital  4. PATTERN Does this come and go, or has it been constant since it started?  Is it present now?     Head and mouth numbness constant. Leg numbness and itchy comes and goes.   5. CARDIAC SYMPTOMS: Have you had any of the following symptoms: chest pain, difficulty breathing, palpitations?     Denies  6. NEUROLOGIC SYMPTOMS: Have you had any of the following symptoms: headache, dizziness, vision loss, double vision, changes  in speech, unsteady on your feet?     Mild slurred speech according to his wife since before going to the hospital. Unchanged since leaving hospital.  7. OTHER SYMPTOMS: Do you have any other symptoms?     Onset or urinary urgency and incontinence prior to going to hospital for brain bleed  Protocols used: Neurologic Deficit-A-AH

## 2024-03-19 NOTE — Transitions of Care (Post Inpatient/ED Visit) (Signed)
° °  03/19/2024  Name: Larry Dawson MRN: [id number] DOB: 05/12/1955  Today's TOC FU Call Status: Today's TOC FU Call Status:: Unsuccessful Call (1st Attempt) Unsuccessful Call (1st Attempt) Date: 03/19/24  Attempted to reach the patient regarding the most recent Inpatient visit.  Left HIPAA compliant voice message   Follow Up Plan: Additional outreach attempts will be made to reach the patient to complete the Transitions of Care (Post Inpatient/ED visit) call.   Pls call/ message for questions,  Jacynda Brunke Mckinney Lipa Knauff, RN, BSN, CCRN Alumnus RN Care Manager  Transitions of Care  [facility] - [facility] [phone]: direct office

## 2024-03-20 ENCOUNTER — Telehealth: Payer: Self-pay

## 2024-03-20 NOTE — Care Plan (Addendum)
 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   Repeat CTH here similar in size 1.9 x 2.2 cm along central pons.  No further neurological workup is necessary. Patient is to follow up with [facility] Neurology and NSG as scheduled. Can consider meclizine PRN if dizzy spells. Would consider PT/OT in the outpatient setting, can discuss with PCP.  If any questions or concerns please reach out via secure to [facility] neurology consult team.

## 2024-03-20 NOTE — Transitions of Care (Post Inpatient/ED Visit) (Signed)
° °  03/20/2024  Name: Larry Dawson MRN: [id number] DOB: 1956-01-29  Today's TOC FU Call Status: Today's TOC FU Call Status:: Successful TOC FU Call Completed TOC FU Call Complete Date: 03/20/24  Patient's Name and Date of Birth confirmed. Name, DOB (patients wife Larry Dawson / dpr on call with patient assisting with health history.)  Transition Care Management Follow-up Telephone Call Date of Discharge: 03/18/24 Discharge Facility: [facility]) Type of Discharge: Inpatient Admission Primary Inpatient Discharge Diagnosis:: lightheadedness/ off balance; ICH: overnight admission How have you been since you were released from the hospital?: Worse Any questions or concerns?: Yes Patient Questions/Concerns:: Patient states he is feeling worse than he did when he was in the hospital.  He states his balance is much worse.  Wife states patient is acting totally different than he did upon leaving the hospitall on 03/18/24.  Wife state patients words a more slurred today.  Patient advised to go to ED today for further evaluation.  Patient and wife verbalized understanding and agreement. Patient Questions/Concerns Addressed: Other: (advised to go to ED today for further evaluation.)  Unable to complete telephone TOC assessment.   Patient  reports symptoms of worsening balance and overall feeling worse. Wife states patient is totally different today than when he was discharged from the hospital on 03/18/24. She states patients words are more slurred. Both patient and wife state they have noticed a change in patients functional ability today.  Patient advised to go to the ED today for further evaluation.  Patient and wife verbalized understanding and agreement.    Arvin Seip RN, BSN, CCM [employer], Population Health Case Manager Phone: [phone]

## 2024-03-23 ENCOUNTER — Ambulatory Visit: Payer: Self-pay | Admitting: Emergency Medicine

## 2024-03-23 ENCOUNTER — Ambulatory Visit: Admitting: Emergency Medicine

## 2024-03-23 ENCOUNTER — Encounter: Payer: Self-pay | Admitting: Emergency Medicine

## 2024-03-23 VITALS — BP 160/100 | HR 64 | Temp 98.2°F | Ht 67.5 in | Wt 234.0 lb

## 2024-03-23 DIAGNOSIS — I629 Nontraumatic intracranial hemorrhage, unspecified: Secondary | ICD-10-CM

## 2024-03-23 DIAGNOSIS — Z7984 Long term (current) use of oral hypoglycemic drugs: Secondary | ICD-10-CM | POA: Diagnosis not present

## 2024-03-23 DIAGNOSIS — Z09 Encounter for follow-up examination after completed treatment for conditions other than malignant neoplasm: Secondary | ICD-10-CM

## 2024-03-23 DIAGNOSIS — E1159 Type 2 diabetes mellitus with other circulatory complications: Secondary | ICD-10-CM

## 2024-03-23 DIAGNOSIS — E785 Hyperlipidemia, unspecified: Secondary | ICD-10-CM | POA: Diagnosis not present

## 2024-03-23 DIAGNOSIS — I152 Hypertension secondary to endocrine disorders: Secondary | ICD-10-CM | POA: Diagnosis not present

## 2024-03-23 DIAGNOSIS — E1169 Type 2 diabetes mellitus with other specified complication: Secondary | ICD-10-CM | POA: Diagnosis not present

## 2024-03-23 DIAGNOSIS — Z7985 Long-term (current) use of injectable non-insulin antidiabetic drugs: Secondary | ICD-10-CM

## 2024-03-23 DIAGNOSIS — N1831 Chronic kidney disease, stage 3a: Secondary | ICD-10-CM

## 2024-03-23 LAB — CBC WITH DIFFERENTIAL/PLATELET
Basophils Absolute: 0.1 K/uL (ref 0.0–0.1)
Basophils Relative: 1.2 % (ref 0.0–3.0)
Eosinophils Absolute: 0.1 K/uL (ref 0.0–0.7)
Eosinophils Relative: 1.2 % (ref 0.0–5.0)
HCT: 42.9 % (ref 39.0–52.0)
Hemoglobin: 14.5 g/dL (ref 13.0–17.0)
Lymphocytes Relative: 27.9 % (ref 12.0–46.0)
Lymphs Abs: 1.9 K/uL (ref 0.7–4.0)
MCHC: 33.7 g/dL (ref 30.0–36.0)
MCV: 82.5 fl (ref 78.0–100.0)
Monocytes Absolute: 0.6 K/uL (ref 0.1–1.0)
Monocytes Relative: 8 % (ref 3.0–12.0)
Neutro Abs: 4.3 K/uL (ref 1.4–7.7)
Neutrophils Relative %: 61.7 % (ref 43.0–77.0)
Platelets: 267 K/uL (ref 150.0–400.0)
RBC: 5.2 Mil/uL (ref 4.22–5.81)
RDW: 13.6 % (ref 11.5–15.5)
WBC: 6.9 K/uL (ref 4.0–10.5)

## 2024-03-23 LAB — COMPREHENSIVE METABOLIC PANEL WITH GFR
ALT: 19 U/L (ref 0–53)
AST: 17 U/L (ref 0–37)
Albumin: 4.2 g/dL (ref 3.5–5.2)
Alkaline Phosphatase: 51 U/L (ref 39–117)
BUN: 14 mg/dL (ref 6–23)
CO2: 31 meq/L (ref 19–32)
Calcium: 9.7 mg/dL (ref 8.4–10.5)
Chloride: 104 meq/L (ref 96–112)
Creatinine, Ser: 1.26 mg/dL (ref 0.40–1.50)
GFR: 59.06 mL/min — ABNORMAL LOW (ref >60.00)
Glucose, Bld: 103 mg/dL — ABNORMAL HIGH (ref 70–99)
Potassium: 3.8 meq/L (ref 3.5–5.1)
Sodium: 140 meq/L (ref 135–145)
Total Bilirubin: 0.9 mg/dL (ref 0.2–1.2)
Total Protein: 7.5 g/dL (ref 6.0–8.3)

## 2024-03-23 NOTE — Patient Instructions (Signed)
 Restart atenolol  chlorthalidone  as before Continue all other medications Follow-up with neurology and neurosurgeons as recommended Monitor blood pressure readings at home daily for the next couple of weeks and contact the office if numbers persistently abnormal  Hypertension, Adult High blood pressure (hypertension) is when the force of blood pumping through the arteries is too strong. The arteries are the blood vessels that carry blood from the heart throughout the body. Hypertension forces the heart to work harder to pump blood and may cause arteries to become narrow or stiff. Untreated or uncontrolled hypertension can lead to a heart attack, heart failure, a stroke, kidney disease, and other problems. A blood pressure reading consists of a higher number over a lower number. Ideally, your blood pressure should be below 120/80. The first (top) number is called the systolic pressure. It is a measure of the pressure in your arteries as your heart beats. The second (bottom) number is called the diastolic pressure. It is a measure of the pressure in your arteries as the heart relaxes. What are the causes? The exact cause of this condition is not known. There are some conditions that result in high blood pressure. What increases the risk? Certain factors may make you more likely to develop high blood pressure. Some of these risk factors are under your control, including: Smoking. Not getting enough exercise or physical activity. Being overweight. Having too much fat, sugar, calories, or salt (sodium) in your diet. Drinking too much alcohol. Other risk factors include: Having a personal history of heart disease, diabetes, high cholesterol, or kidney disease. Stress. Having a family history of high blood pressure and high cholesterol. Having obstructive sleep apnea. Age. The risk increases with age. What are the signs or symptoms? High blood pressure may not cause symptoms. Very high blood  pressure (hypertensive crisis) may cause: Headache. Fast or irregular heartbeats (palpitations). Shortness of breath. Nosebleed. Nausea and vomiting. Vision changes. Severe chest pain, dizziness, and seizures. How is this diagnosed? This condition is diagnosed by measuring your blood pressure while you are seated, with your arm resting on a flat surface, your legs uncrossed, and your feet flat on the floor. The cuff of the blood pressure monitor will be placed directly against the skin of your upper arm at the level of your heart. Blood pressure should be measured at least twice using the same arm. Certain conditions can cause a difference in blood pressure between your right and left arms. If you have a high blood pressure reading during one visit or you have normal blood pressure with other risk factors, you may be asked to: Return on a different day to have your blood pressure checked again. Monitor your blood pressure at home for 1 week or longer. If you are diagnosed with hypertension, you may have other blood or imaging tests to help your health care provider understand your overall risk for other conditions. How is this treated? This condition is treated by making healthy lifestyle changes, such as eating healthy foods, exercising more, and reducing your alcohol intake. You may be referred for counseling on a healthy diet and physical activity. Your health care provider may prescribe medicine if lifestyle changes are not enough to get your blood pressure under control and if: Your systolic blood pressure is above 130. Your diastolic blood pressure is above 80. Your personal target blood pressure may vary depending on your medical conditions, your age, and other factors. Follow these instructions at home: Eating and drinking  Eat a diet that  is high in fiber and potassium, and low in sodium, added sugar, and fat. An example of this eating plan is called the DASH diet. DASH stands for  Dietary Approaches to Stop Hypertension. To eat this way: Eat plenty of fresh fruits and vegetables. Try to fill one half of your plate at each meal with fruits and vegetables. Eat whole grains, such as whole-wheat pasta, brown rice, or whole-grain bread. Fill about one fourth of your plate with whole grains. Eat or drink low-fat dairy products, such as skim milk or low-fat yogurt. Avoid fatty cuts of meat, processed or cured meats, and poultry with skin. Fill about one fourth of your plate with lean proteins, such as fish, chicken without skin, beans, eggs, or tofu. Avoid pre-made and processed foods. These tend to be higher in sodium, added sugar, and fat. Reduce your daily sodium intake. Many people with hypertension should eat less than 1,500 mg of sodium a day. Do not drink alcohol if: Your health care provider tells you not to drink. You are pregnant, may be pregnant, or are planning to become pregnant. If you drink alcohol: Limit how much you have to: 0-1 drink a day for women. 0-2 drinks a day for men. Know how much alcohol is in your drink. In the [place] one drink equals one 12 oz bottle of beer (355 mL), one 5 oz glass of wine (148 mL), or one 1 oz glass of hard liquor (44 mL). Lifestyle  Work with your health care provider to maintain a healthy body weight or to lose weight. Ask what an ideal weight is for you. Get at least 30 minutes of exercise that causes your heart to beat faster (aerobic exercise) most days of the week. Activities may include walking, swimming, or biking. Include exercise to strengthen your muscles (resistance exercise), such as Pilates or lifting weights, as part of your weekly exercise routine. Try to do these types of exercises for 30 minutes at least 3 days a week. Do not use any products that contain nicotine or tobacco. These products include cigarettes, chewing tobacco, and vaping devices, such as e-cigarettes. If you need help quitting, ask your health  care provider. Monitor your blood pressure at home as told by your health care provider. Keep all follow-up visits. This is important. Medicines Take over-the-counter and prescription medicines only as told by your health care provider. Follow directions carefully. Blood pressure medicines must be taken as prescribed. Do not skip doses of blood pressure medicine. Doing this puts you at risk for problems and can make the medicine less effective. Ask your health care provider about side effects or reactions to medicines that you should watch for. Contact a health care provider if you: Think you are having a reaction to a medicine you are taking. Have headaches that keep coming back (recurring). Feel dizzy. Have swelling in your ankles. Have trouble with your vision. Get help right away if you: Develop a severe headache or confusion. Have unusual weakness or numbness. Feel faint. Have severe pain in your chest or abdomen. Vomit repeatedly. Have trouble breathing. These symptoms may be an emergency. Get help right away. Call 911. Do not wait to see if the symptoms will go away. Do not drive yourself to the hospital. Summary Hypertension is when the force of blood pumping through your arteries is too strong. If this condition is not controlled, it may put you at risk for serious complications. Your personal target blood pressure may vary depending on your  medical conditions, your age, and other factors. For most people, a normal blood pressure is less than 120/80. Hypertension is treated with lifestyle changes, medicines, or a combination of both. Lifestyle changes include losing weight, eating a healthy, low-sodium diet, exercising more, and limiting alcohol. This information is not intended to replace advice given to you by your health care provider. Make sure you discuss any questions you have with your health care provider. Document Revised: 01/31/2021 Document Reviewed:  01/31/2021 Elsevier Patient Education  2024 [employer].

## 2024-03-23 NOTE — Assessment & Plan Note (Signed)
 Advised to stay well-hydrated and avoid NSAIDs. Continue Jardiance 25 mg daily

## 2024-03-23 NOTE — Assessment & Plan Note (Signed)
 Most recent brain imaging report reviewed Clinically stable.  No red flag signs or symptoms Needs to follow-up with both neurologist and neurosurgeon Blood pressure control needs to be optimized.

## 2024-03-23 NOTE — Assessment & Plan Note (Signed)
 Chronic stable conditions Much improved diabetes with hemoglobin A1c of 7.2 Diet and nutrition once again discussed Continue Mounjaro , glipizide , Jardiance , and metformin  Continue rosuvastatin  20 mg daily

## 2024-03-23 NOTE — Progress Notes (Signed)
 Larry Dawson 67 y.o.   Chief Complaint  Patient presents with   Follow-up    Pt states that he is not doing good today pt states that the front of his head feels tight and that he does not feel good at all today     HISTORY OF PRESENT ILLNESS: This is a 68 y.o. male here for hospital discharge follow-up Feels tired and has frontal headache. Discharge summary as follows: Expand All Collapse All    PATIENT DETAILS Name: Larry Dawson Age: 68 y.o. Sex: male Date of Birth: April 14, 1955 MRN: [id number]. Admitting Physician: Lamar Dess, MD ERE:Djhjmipj, Emil Schanz, MD   Admit Date: 03/16/2024 Discharge date: 03/18/2024   Recommendations for Outpatient Follow-up:  Follow up with PCP in 1-2 weeks Please obtain CMP/CBC in one week Please ensure follow-up with stroke clinic at [facility], [facility] neurosurgery-Dr. Gildardo repeat CT head Please ensure follow-up with pulmonology clinic-has a enlarging lung nodule.    Brief Summary: 68 year old with HTN, HLD, DM-2, COPD-who presented feeling lightheaded-off balance-upon further evaluation-he was found to have either a ICH or a hemorrhagic mass.  See below for further details   Significant studies 9/29>> A1c: 7.2 12/8>> CT head: 1.9 x 1.8 x 1.6 hypoattenuating lesion in the pons concerning for hemorrhagic mass. 12/8>> MRI brain: Stable intraparenchymal hemorrhage in the pons without visible surrounding infarct or enhancement to suggest mass. 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 12/9>> B/L carotid Doppler: No significant stenosis 12/9>> echo: EF 50-55%. 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 [name]. Per neuro-stable for discharge-blood pressure has been adequately controlled this morning.   Enlarging right upper lobe lung nodule Discussed with PCCM PA-C-Paul Hoffman-he will try to get follow-up appointment as an outpatient. PCP to ensure outpatient follow-up with pulmonology for further continued workup.   HTN Blood pressure seems to be well-controlled with atenolol -chlorthalidone  continues to be on hold   AKI Mild Stable for close outpatient monitoring with PCP Continue to hold chlorthalidone  for now.   HLD LDL stable-not taking Crestor -since LDL at goal-okay to hold off-and follow-up with PCP.   DM-2 Resume oral hypoglycemics/outpatient regimen post discharge   Class 1 Obesity: Estimated body mass index is 34.77 kg/m as calculated from the following:   Height as of this encounter: 5' 7.5 (1.715 m).   Weight as of this encounter: 102.2 kg.      Discharge Diagnoses:  Principal Problem:   Intracranial hemorrhage (HCC)  Also went to emergency department 2 days ago complaining of headache.  Assessment as follows:  Progress Notes - documented in this encounter  Cynthia Nguyen, DO - 03/20/2024 6:38 PM EST Formatting of this note might be different from the original. 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  Repeat CTH here similar in size 1.9 x 2.2 cm along central pons. No further neurological workup is necessary. Patient is to follow up with [facility] Neurology and  NSG as scheduled. Can consider meclizine PRN if dizzy spells. Would consider PT/OT in the outpatient setting, can discuss with PCP.  If any questions or concerns please reach out  via secure to [facility] neurology consult team. Electronically signed by Montie Miyamoto, DO at 03/20/2024 6:47 PM EST Electronically signed by Montie Miyamoto, DO at 03/20/2024 6:47 PM EST  HPI     Prior to Admission medications  Medication Sig Start Date End Date Taking? Authorizing Provider  Ascorbic Acid (VITAMIN C PO) Take 1 tablet by mouth daily.   Yes [provider]  atenolol  (TENORMIN ) 50 MG tablet Take 1 tablet (50 mg total) by mouth daily. 03/19/24  Yes Ghimire, Donalda HERO, MD  cetirizine  (ZYRTEC ) 10 MG tablet Take 1 tablet (10 mg total) by mouth daily. 05/10/17  Yes Burky, Natalie B, NP  cholecalciferol (VITAMIN D) 1000 units tablet Take 1,000 Units by mouth daily.   Yes [provider]  glipiZIDE  (GLUCOTROL ) 5 MG tablet TAKE 1 TABLET BY MOUTH 2 TIMES A DAY BEFORE A MEAL 01/25/24  Yes Roshawna Colclasure, Emil Schanz, MD  metFORMIN  (GLUCOPHAGE ) 500 MG tablet TAKE 1 TABLET BY MOUTH TWICE A DAY WITH A MEAL 01/13/24  Yes Jonn Chaikin, Emil Schanz, MD  Multiple Vitamins-Minerals (ZINC PO) Take 1 tablet by mouth daily.   Yes [provider]  Omega-3 Fatty Acids (FISH OIL) 1000 MG CAPS Take 1 capsule by mouth daily.   Yes [provider]  sildenafil  (VIAGRA ) 100 MG tablet Take 0.5-1 tablets (50-100 mg total) by mouth daily as needed for erectile dysfunction. 10/04/20  Yes Kaliah Haddaway, Emil Schanz, MD  tirzepatide  (MOUNJARO ) 5 MG/0.5ML Pen Inject 5 mg into the skin once a week. 01/06/24  Yes Long Brimage, Emil Schanz, MD  Turmeric POWD Take by mouth daily.   Yes [provider]    Allergies[1]  Patient Active Problem List   Diagnosis Date Noted   Intracranial hemorrhage (HCC) 03/17/2024   Obesity, morbid (HCC) 01/06/2024   Encounter for Department of Transportation (DOT) examination for driving license renewal 93/84/7974   OSA (obstructive sleep apnea) 09/22/2023   Shifting sleep-work schedule, affecting sleep 08/21/2023   At risk for obstructive sleep apnea 08/21/2023    Rash and nonspecific skin eruption 05/06/2023   Suspected sleep apnea 05/06/2023   Cheilitis 03/05/2023   Stage 3a chronic kidney disease (HCC) 12/20/2022   Lower urinary tract symptoms (LUTS) 06/26/2022   Lumbar degenerative disc disease 03/16/2021   Erectile dysfunction 10/04/2020   Dyslipidemia associated with type 2 diabetes mellitus (HCC) 08/06/2018   Dyslipidemia 08/06/2018   Hypertension associated with type 2 diabetes mellitus (HCC) 07/18/2011    Past Medical History:  Diagnosis Date   Allergy    Anal fissure    COPD (chronic obstructive pulmonary disease) (HCC)    Albuterol  PRN.   Hydrocele    Hyperlipidemia    Hypertension    IBS (irritable bowel syndrome) 05/22/13   per chart note-pt denied    Past Surgical History:  Procedure Laterality Date   HERNIA REPAIR  prior to 2013   HYDROCELE EXCISION Bilateral 11/23/2013   Procedure: HYDROCELECTOMY ADULT;  Surgeon: Alm GORMAN Fragmin, MD;  Location: [facility];  Service: Urology;  Laterality: Bilateral;   VASECTOMY  prior to 2013    Social History   Socioeconomic History   Marital status: Married    Spouse name: Not on file   Number of children: Not on file   Years of education: Not on file   Highest education  level: Associate degree: occupational, scientist, product/process development, or vocational program  Occupational History   Not on file  Tobacco Use   Smoking status: Former    Types: Cigarettes   Smokeless tobacco: Never  Vaping Use   Vaping status: Never Used  Substance and Sexual Activity   Alcohol use: Not Currently    Comment: rarely   Drug use: No   Sexual activity: Yes    Partners: Female  Other Topics Concern   Not on file  Social History Narrative   Marital status: married      Employment: [employer]; drives all over.  Owns company.      Tobacco:  Quit smoking in 2005; smoked 25 years      Alcohol:  Rare beer      Drugs:  None      Exercise:  None   Daily caffeine    Social Drivers of  Health   Tobacco Use: Medium Risk (03/23/2024)   Patient History    Smoking Tobacco Use: Former    Smokeless Tobacco Use: Never    Passive Exposure: Not on file  Financial Resource Strain: Low Risk (01/04/2024)   Overall Financial Resource Strain (CARDIA)    Difficulty of Paying Living Expenses: Not very hard  Food Insecurity: No Food Insecurity (03/17/2024)   Epic    Worried About Programme Researcher, Broadcasting/film/video in the Last Year: Never true    Ran Out of Food in the Last Year: Never true  Transportation Needs: No Transportation Needs (03/17/2024)   Epic    Lack of Transportation (Medical): No    Lack of Transportation (Non-Medical): No  Physical Activity: Insufficiently Active (01/04/2024)   Exercise Vital Sign    Days of Exercise per Week: 3 days    Minutes of Exercise per Session: 40 min  Stress: No Stress Concern Present (01/04/2024)   [employer] of Occupational Health - Occupational Stress Questionnaire    Feeling of Stress: Not at all  Social Connections: Socially Integrated (03/17/2024)   Social Connection and Isolation Panel    Frequency of Communication with Friends and Family: Three times a week    Frequency of Social Gatherings with Friends and Family: Once a week    Attends Religious Services: More than 4 times per year    Active Member of Clubs or Organizations: Yes    Attends Banker Meetings: More than 4 times per year    Marital Status: Married  Catering Manager Violence: Not At Risk (03/17/2024)   Epic    Fear of Current or Ex-Partner: No    Emotionally Abused: No    Physically Abused: No    Sexually Abused: No  Depression (PHQ2-9): Low Risk (05/17/2023)   Depression (PHQ2-9)    PHQ-2 Score: 0  Alcohol Screen: Low Risk (01/04/2024)   Alcohol Screen    Last Alcohol Screening Score (AUDIT): 1  Housing: Low Risk (03/17/2024)   Epic    Unable to Pay for Housing in the Last Year: No    Number of Times Moved in the Last Year: 0    Homeless in the Last Year: No   Utilities: Not At Risk (03/17/2024)   Epic    Threatened with loss of utilities: No  Health Literacy: Adequate Health Literacy (05/17/2023)   B1300 Health Literacy    Frequency of need for help with medical instructions: Never    Family History  Problem Relation Age of Onset   Diabetes Father    Hypertension Father  Diabetes Brother    Hypertension Brother    Diabetes Brother    Diabetes Brother      Review of Systems  Constitutional:  Positive for malaise/fatigue. Negative for fever.  HENT: Negative.  Negative for congestion and sore throat.   Respiratory: Negative.  Negative for cough and shortness of breath.   Cardiovascular: Negative.  Negative for chest pain and palpitations.  Gastrointestinal:  Negative for abdominal pain, diarrhea, nausea and vomiting.  Genitourinary: Negative.  Negative for dysuria and hematuria.  Skin: Negative.   Neurological:  Positive for headaches. Negative for dizziness.  All other systems reviewed and are negative.   Vitals:   03/23/24 1033  BP: (!) 160/100  Pulse: 64  Temp: 98.2 F (36.8 C)  SpO2: 98%    Physical Exam Vitals reviewed.  Constitutional:      Appearance: Normal appearance.  HENT:     Head: Normocephalic.     Mouth/Throat:     Mouth: Mucous membranes are moist.     Pharynx: Oropharynx is clear.  Eyes:     Extraocular Movements: Extraocular movements intact.     Conjunctiva/sclera: Conjunctivae normal.     Pupils: Pupils are equal, round, and reactive to light.  Cardiovascular:     Rate and Rhythm: Normal rate and regular rhythm.     Pulses: Normal pulses.     Heart sounds: Normal heart sounds.  Pulmonary:     Effort: Pulmonary effort is normal.     Breath sounds: Normal breath sounds.  Musculoskeletal:     Cervical back: No tenderness.  Lymphadenopathy:     Cervical: No cervical adenopathy.  Skin:    General: Skin is warm and dry.  Neurological:     General: No focal deficit present.     Mental Status:  He is alert and oriented to person, place, and time.     Cranial Nerves: No cranial nerve deficit.     Sensory: No sensory deficit.     Motor: No weakness.     Coordination: Coordination normal.     Gait: Gait normal.  Psychiatric:        Mood and Affect: Mood normal.        Behavior: Behavior normal.      ASSESSMENT & PLAN: A total of 43 minutes was spent with the patient and counseling/coordination of care regarding preparing for this visit, review of most recent office visit notes, review of most recent hospital discharge summary, diagnosis of uncontrolled hypertension and cardiovascular risks associated with this, review of multiple chronic medical conditions and their management, review of all medications and changes made, review of most recent bloodwork results, review of health maintenance items, education on nutrition, prognosis, documentation, and need for follow up.   Problem List Items Addressed This Visit       Cardiovascular and Mediastinum   Hypertension associated with type 2 diabetes mellitus (HCC)   BP Readings from Last 3 Encounters:  03/23/24 (!) 160/100  03/18/24 125/74  01/06/24 132/84  Restart atenolol  chlorthalidone  50-25 daily Monitor blood pressure readings at home daily for the next couple of weeks and contact the office if numbers persistently abnormal Diet and nutrition discussed ED precautions given Lab Results  Component Value Date   HGBA1C 7.2 (A) 01/06/2024  Continue Mounjaro  5 mg weekly, Jardiance  25 mg daily and glipizide  5 mg twice a day Continue metformin  500 mg twice a day        Relevant Orders   CBC with Differential/Platelet  Comprehensive metabolic panel with GFR     Endocrine   Dyslipidemia associated with type 2 diabetes mellitus (HCC)   Chronic stable conditions Much improved diabetes with hemoglobin A1c of 7.2 Diet and nutrition once again discussed Continue Mounjaro , glipizide , Jardiance , and metformin  Continue  rosuvastatin  20 mg daily        Genitourinary   Stage 3a chronic kidney disease (HCC)   Advised to stay well-hydrated and avoid NSAIDs Continue Jardiance  25 mg daily        Other   Intracranial hemorrhage (HCC) - Primary   Most recent brain imaging report reviewed Clinically stable.  No red flag signs or symptoms Needs to follow-up with both neurologist and neurosurgeon Blood pressure control needs to be optimized.      RESOLVED: Dyslipidemia   Other Visit Diagnoses       Hospital discharge follow-up          Patient Instructions  Restart atenolol  chlorthalidone  as before Continue all other medications Follow-up with neurology and neurosurgeons as recommended Monitor blood pressure readings at home daily for the next couple of weeks and contact the office if numbers persistently abnormal  Hypertension, Adult High blood pressure (hypertension) is when the force of blood pumping through the arteries is too strong. The arteries are the blood vessels that carry blood from the heart throughout the body. Hypertension forces the heart to work harder to pump blood and may cause arteries to become narrow or stiff. Untreated or uncontrolled hypertension can lead to a heart attack, heart failure, a stroke, kidney disease, and other problems. A blood pressure reading consists of a higher number over a lower number. Ideally, your blood pressure should be below 120/80. The first (top) number is called the systolic pressure. It is a measure of the pressure in your arteries as your heart beats. The second (bottom) number is called the diastolic pressure. It is a measure of the pressure in your arteries as the heart relaxes. What are the causes? The exact cause of this condition is not known. There are some conditions that result in high blood pressure. What increases the risk? Certain factors may make you more likely to develop high blood pressure. Some of these risk factors are under your  control, including: Smoking. Not getting enough exercise or physical activity. Being overweight. Having too much fat, sugar, calories, or salt (sodium) in your diet. Drinking too much alcohol. Other risk factors include: Having a personal history of heart disease, diabetes, high cholesterol, or kidney disease. Stress. Having a family history of high blood pressure and high cholesterol. Having obstructive sleep apnea. Age. The risk increases with age. What are the signs or symptoms? High blood pressure may not cause symptoms. Very high blood pressure (hypertensive crisis) may cause: Headache. Fast or irregular heartbeats (palpitations). Shortness of breath. Nosebleed. Nausea and vomiting. Vision changes. Severe chest pain, dizziness, and seizures. How is this diagnosed? This condition is diagnosed by measuring your blood pressure while you are seated, with your arm resting on a flat surface, your legs uncrossed, and your feet flat on the floor. The cuff of the blood pressure monitor will be placed directly against the skin of your upper arm at the level of your heart. Blood pressure should be measured at least twice using the same arm. Certain conditions can cause a difference in blood pressure between your right and left arms. If you have a high blood pressure reading during one visit or you have normal blood pressure with other  risk factors, you may be asked to: Return on a different day to have your blood pressure checked again. Monitor your blood pressure at home for 1 week or longer. If you are diagnosed with hypertension, you may have other blood or imaging tests to help your health care provider understand your overall risk for other conditions. How is this treated? This condition is treated by making healthy lifestyle changes, such as eating healthy foods, exercising more, and reducing your alcohol intake. You may be referred for counseling on a healthy diet and physical  activity. Your health care provider may prescribe medicine if lifestyle changes are not enough to get your blood pressure under control and if: Your systolic blood pressure is above 130. Your diastolic blood pressure is above 80. Your personal target blood pressure may vary depending on your medical conditions, your age, and other factors. Follow these instructions at home: Eating and drinking  Eat a diet that is high in fiber and potassium, and low in sodium, added sugar, and fat. An example of this eating plan is called the DASH diet. DASH stands for Dietary Approaches to Stop Hypertension. To eat this way: Eat plenty of fresh fruits and vegetables. Try to fill one half of your plate at each meal with fruits and vegetables. Eat whole grains, such as whole-wheat pasta, brown rice, or whole-grain bread. Fill about one fourth of your plate with whole grains. Eat or drink low-fat dairy products, such as skim milk or low-fat yogurt. Avoid fatty cuts of meat, processed or cured meats, and poultry with skin. Fill about one fourth of your plate with lean proteins, such as fish, chicken without skin, beans, eggs, or tofu. Avoid pre-made and processed foods. These tend to be higher in sodium, added sugar, and fat. Reduce your daily sodium intake. Many people with hypertension should eat less than 1,500 mg of sodium a day. Do not drink alcohol if: Your health care provider tells you not to drink. You are pregnant, may be pregnant, or are planning to become pregnant. If you drink alcohol: Limit how much you have to: 0-1 drink a day for women. 0-2 drinks a day for men. Know how much alcohol is in your drink. In the [place] one drink equals one 12 oz bottle of beer (355 mL), one 5 oz glass of wine (148 mL), or one 1 oz glass of hard liquor (44 mL). Lifestyle  Work with your health care provider to maintain a healthy body weight or to lose weight. Ask what an ideal weight is for you. Get at least 30  minutes of exercise that causes your heart to beat faster (aerobic exercise) most days of the week. Activities may include walking, swimming, or biking. Include exercise to strengthen your muscles (resistance exercise), such as Pilates or lifting weights, as part of your weekly exercise routine. Try to do these types of exercises for 30 minutes at least 3 days a week. Do not use any products that contain nicotine or tobacco. These products include cigarettes, chewing tobacco, and vaping devices, such as e-cigarettes. If you need help quitting, ask your health care provider. Monitor your blood pressure at home as told by your health care provider. Keep all follow-up visits. This is important. Medicines Take over-the-counter and prescription medicines only as told by your health care provider. Follow directions carefully. Blood pressure medicines must be taken as prescribed. Do not skip doses of blood pressure medicine. Doing this puts you at risk for problems and can make  the medicine less effective. Ask your health care provider about side effects or reactions to medicines that you should watch for. Contact a health care provider if you: Think you are having a reaction to a medicine you are taking. Have headaches that keep coming back (recurring). Feel dizzy. Have swelling in your ankles. Have trouble with your vision. Get help right away if you: Develop a severe headache or confusion. Have unusual weakness or numbness. Feel faint. Have severe pain in your chest or abdomen. Vomit repeatedly. Have trouble breathing. These symptoms may be an emergency. Get help right away. Call 911. Do not wait to see if the symptoms will go away. Do not drive yourself to the hospital. Summary Hypertension is when the force of blood pumping through your arteries is too strong. If this condition is not controlled, it may put you at risk for serious complications. Your personal target blood pressure may vary  depending on your medical conditions, your age, and other factors. For most people, a normal blood pressure is less than 120/80. Hypertension is treated with lifestyle changes, medicines, or a combination of both. Lifestyle changes include losing weight, eating a healthy, low-sodium diet, exercising more, and limiting alcohol. This information is not intended to replace advice given to you by your health care provider. Make sure you discuss any questions you have with your health care provider. Document Revised: 01/31/2021 Document Reviewed: 01/31/2021 Elsevier Patient Education  2024 Elsevier Inc.    Emil Schaumann, MD [facility]    [1]  Allergies Allergen Reactions   Ace Inhibitors Swelling    Suspected angioedema   Penicillins Anaphylaxis, Hives, Rash and Other (See Comments)    Throat swells   Latex Hives, Swelling and Rash

## 2024-03-23 NOTE — Assessment & Plan Note (Signed)
 BP Readings from Last 3 Encounters:  03/23/24 (!) 160/100  03/18/24 125/74  01/06/24 132/84  Restart atenolol  chlorthalidone  50-25 daily Monitor blood pressure readings at home daily for the next couple of weeks and contact the office if numbers persistently abnormal Diet and nutrition discussed ED precautions given Lab Results  Component Value Date   HGBA1C 7.2 (A) 01/06/2024  Continue Mounjaro  5 mg weekly, Jardiance  25 mg daily and glipizide  5 mg twice a day Continue metformin  500 mg twice a day

## 2024-03-24 ENCOUNTER — Encounter: Payer: Self-pay | Admitting: Neurosurgery

## 2024-03-24 ENCOUNTER — Ambulatory Visit: Admitting: Neurosurgery

## 2024-03-24 VITALS — BP 170/103 | HR 101 | Temp 97.7°F | Ht 67.5 in | Wt 242.0 lb

## 2024-03-24 DIAGNOSIS — I629 Nontraumatic intracranial hemorrhage, unspecified: Secondary | ICD-10-CM

## 2024-03-24 DIAGNOSIS — D1802 Hemangioma of intracranial structures: Secondary | ICD-10-CM | POA: Diagnosis not present

## 2024-03-24 DIAGNOSIS — I613 Nontraumatic intracerebral hemorrhage in brain stem: Secondary | ICD-10-CM | POA: Diagnosis not present
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
## 2024-03-26 ENCOUNTER — Inpatient Hospital Stay

## 2024-04-01 ENCOUNTER — Encounter: Payer: Self-pay | Admitting: *Deleted

## 2024-04-01 ENCOUNTER — Encounter (HOSPITAL_COMMUNITY): Payer: Self-pay

## 2024-04-01 ENCOUNTER — Emergency Department (HOSPITAL_COMMUNITY)

## 2024-04-01 ENCOUNTER — Telehealth: Payer: Self-pay | Admitting: Neurosurgery

## 2024-04-01 ENCOUNTER — Other Ambulatory Visit: Payer: Self-pay

## 2024-04-01 ENCOUNTER — Inpatient Hospital Stay (HOSPITAL_COMMUNITY)
Admission: EM | Admit: 2024-04-01 | Discharge: 2024-04-20 | DRG: 023 | Disposition: A | Discharge status: Rehabilitation Facility | Attending: Internal Medicine | Admitting: Internal Medicine

## 2024-04-01 DIAGNOSIS — I613 Nontraumatic intracerebral hemorrhage in brain stem: Secondary | ICD-10-CM | POA: Diagnosis not present

## 2024-04-01 DIAGNOSIS — H9190 Unspecified hearing loss, unspecified ear: Secondary | ICD-10-CM | POA: Diagnosis present

## 2024-04-01 DIAGNOSIS — Z888 Allergy status to other drugs, medicaments and biological substances status: Secondary | ICD-10-CM

## 2024-04-01 DIAGNOSIS — G936 Cerebral edema: Secondary | ICD-10-CM | POA: Diagnosis present

## 2024-04-01 DIAGNOSIS — J449 Chronic obstructive pulmonary disease, unspecified: Secondary | ICD-10-CM | POA: Diagnosis present

## 2024-04-01 DIAGNOSIS — R29703 NIHSS score 3: Secondary | ICD-10-CM | POA: Diagnosis present

## 2024-04-01 DIAGNOSIS — Z7985 Long-term (current) use of injectable non-insulin antidiabetic drugs: Secondary | ICD-10-CM

## 2024-04-01 DIAGNOSIS — E785 Hyperlipidemia, unspecified: Secondary | ICD-10-CM | POA: Diagnosis present

## 2024-04-01 DIAGNOSIS — I629 Nontraumatic intracranial hemorrhage, unspecified: Secondary | ICD-10-CM

## 2024-04-01 DIAGNOSIS — Q283 Other malformations of cerebral vessels: Secondary | ICD-10-CM | POA: Diagnosis not present

## 2024-04-01 DIAGNOSIS — Z833 Family history of diabetes mellitus: Secondary | ICD-10-CM

## 2024-04-01 DIAGNOSIS — K219 Gastro-esophageal reflux disease without esophagitis: Secondary | ICD-10-CM | POA: Diagnosis present

## 2024-04-01 DIAGNOSIS — H4922 Sixth [abducent] nerve palsy, left eye: Secondary | ICD-10-CM | POA: Diagnosis present

## 2024-04-01 DIAGNOSIS — G8191 Hemiplegia, unspecified affecting right dominant side: Secondary | ICD-10-CM | POA: Diagnosis present

## 2024-04-01 DIAGNOSIS — Z880 Allergy status to penicillin: Secondary | ICD-10-CM

## 2024-04-01 DIAGNOSIS — E876 Hypokalemia: Secondary | ICD-10-CM | POA: Diagnosis present

## 2024-04-01 DIAGNOSIS — Z79899 Other long term (current) drug therapy: Secondary | ICD-10-CM

## 2024-04-01 DIAGNOSIS — R911 Solitary pulmonary nodule: Secondary | ICD-10-CM | POA: Diagnosis present

## 2024-04-01 DIAGNOSIS — R471 Dysarthria and anarthria: Secondary | ICD-10-CM | POA: Diagnosis present

## 2024-04-01 DIAGNOSIS — Z8249 Family history of ischemic heart disease and other diseases of the circulatory system: Secondary | ICD-10-CM

## 2024-04-01 DIAGNOSIS — Z91040 Latex allergy status: Secondary | ICD-10-CM

## 2024-04-01 DIAGNOSIS — E1165 Type 2 diabetes mellitus with hyperglycemia: Secondary | ICD-10-CM | POA: Diagnosis present

## 2024-04-01 DIAGNOSIS — R1310 Dysphagia, unspecified: Secondary | ICD-10-CM | POA: Diagnosis present

## 2024-04-01 DIAGNOSIS — Z87891 Personal history of nicotine dependence: Secondary | ICD-10-CM

## 2024-04-01 DIAGNOSIS — R278 Other lack of coordination: Secondary | ICD-10-CM | POA: Diagnosis present

## 2024-04-01 DIAGNOSIS — Z87892 Personal history of anaphylaxis: Secondary | ICD-10-CM

## 2024-04-01 DIAGNOSIS — K589 Irritable bowel syndrome without diarrhea: Secondary | ICD-10-CM | POA: Diagnosis present

## 2024-04-01 DIAGNOSIS — Z6835 Body mass index (BMI) 35.0-35.9, adult: Secondary | ICD-10-CM

## 2024-04-01 DIAGNOSIS — R443 Hallucinations, unspecified: Secondary | ICD-10-CM | POA: Diagnosis not present

## 2024-04-01 DIAGNOSIS — E1159 Type 2 diabetes mellitus with other circulatory complications: Secondary | ICD-10-CM | POA: Diagnosis present

## 2024-04-01 DIAGNOSIS — H510 Palsy (spasm) of conjugate gaze: Secondary | ICD-10-CM | POA: Diagnosis present

## 2024-04-01 DIAGNOSIS — I152 Hypertension secondary to endocrine disorders: Secondary | ICD-10-CM | POA: Diagnosis present

## 2024-04-01 DIAGNOSIS — E66812 Obesity, class 2: Secondary | ICD-10-CM | POA: Diagnosis present

## 2024-04-01 DIAGNOSIS — Z7984 Long term (current) use of oral hypoglycemic drugs: Secondary | ICD-10-CM

## 2024-04-01 DIAGNOSIS — Z794 Long term (current) use of insulin: Secondary | ICD-10-CM

## 2024-04-01 DIAGNOSIS — R29810 Facial weakness: Secondary | ICD-10-CM | POA: Diagnosis present

## 2024-04-01 DIAGNOSIS — D1802 Hemangioma of intracranial structures: Secondary | ICD-10-CM | POA: Diagnosis present

## 2024-04-01 HISTORY — DX: Type 2 diabetes mellitus without complications: E11.9

## 2024-04-01 LAB — COMPREHENSIVE METABOLIC PANEL WITH GFR
ALT: 20 U/L (ref 0–44)
AST: 20 U/L (ref 15–41)
Albumin: 4.1 g/dL (ref 3.5–5.0)
Alkaline Phosphatase: 58 U/L (ref 38–126)
Anion gap: 12 (ref 5–15)
BUN: 15 mg/dL (ref 8–23)
CO2: 27 mmol/L (ref 22–32)
Calcium: 10.1 mg/dL (ref 8.9–10.3)
Chloride: 98 mmol/L (ref 98–111)
Creatinine, Ser: 1.2 mg/dL (ref 0.61–1.24)
GFR, Estimated: >60 mL/min
Glucose, Bld: 116 mg/dL — ABNORMAL HIGH (ref 70–99)
Potassium: 3.1 mmol/L — ABNORMAL LOW (ref 3.5–5.1)
Sodium: 136 mmol/L (ref 135–145)
Total Bilirubin: 1.3 mg/dL — ABNORMAL HIGH (ref 0.0–1.2)
Total Protein: 7.6 g/dL (ref 6.5–8.1)

## 2024-04-01 LAB — URINALYSIS, W/ REFLEX TO CULTURE (INFECTION SUSPECTED)
Bilirubin Urine: NEGATIVE
Glucose, UA: NEGATIVE mg/dL
Hgb urine dipstick: NEGATIVE
Ketones, ur: NEGATIVE mg/dL
Leukocytes,Ua: NEGATIVE
Nitrite: NEGATIVE
Protein, ur: NEGATIVE mg/dL
Specific Gravity, Urine: 1.018 (ref 1.005–1.030)
pH: 5 (ref 5.0–8.0)

## 2024-04-01 LAB — URINE DRUG SCREEN
Amphetamines: NEGATIVE
Barbiturates: NEGATIVE
Benzodiazepines: NEGATIVE
Cocaine: NEGATIVE
Fentanyl: NEGATIVE
Methadone Scn, Ur: NEGATIVE
Opiates: NEGATIVE
Tetrahydrocannabinol: NEGATIVE

## 2024-04-01 LAB — CBC WITH DIFFERENTIAL/PLATELET
Abs Immature Granulocytes: 0.03 K/uL (ref 0.00–0.07)
Basophils Absolute: 0.1 K/uL (ref 0.0–0.1)
Basophils Relative: 1 %
Eosinophils Absolute: 0.1 K/uL (ref 0.0–0.5)
Eosinophils Relative: 1 %
HCT: 45.9 % (ref 39.0–52.0)
Hemoglobin: 15.6 g/dL (ref 13.0–17.0)
Immature Granulocytes: 0 %
Lymphocytes Relative: 23 %
Lymphs Abs: 2.4 K/uL (ref 0.7–4.0)
MCH: 28 pg (ref 26.0–34.0)
MCHC: 34 g/dL (ref 30.0–36.0)
MCV: 82.3 fL (ref 80.0–100.0)
Monocytes Absolute: 0.9 K/uL (ref 0.1–1.0)
Monocytes Relative: 8 %
Neutro Abs: 6.9 K/uL (ref 1.7–7.7)
Neutrophils Relative %: 67 %
Platelets: 320 K/uL (ref 150–400)
RBC: 5.58 MIL/uL (ref 4.22–5.81)
RDW: 12.6 % (ref 11.5–15.5)
WBC: 10.4 K/uL (ref 4.0–10.5)
nRBC: 0 % (ref 0.0–0.2)

## 2024-04-01 LAB — RESP PANEL BY RT-PCR (RSV, FLU A&B, COVID)  RVPGX2
Influenza A by PCR: NEGATIVE
Influenza B by PCR: NEGATIVE
Resp Syncytial Virus by PCR: NEGATIVE
SARS Coronavirus 2 by RT PCR: NEGATIVE

## 2024-04-01 LAB — SODIUM: Sodium: 136 mmol/L (ref 135–145)

## 2024-04-01 LAB — CBG MONITORING, ED: Glucose-Capillary: 84 mg/dL (ref 70–99)

## 2024-04-01 MED ORDER — SODIUM CHLORIDE 3 % IV SOLN
INTRAVENOUS | Status: DC
  Filled 2024-04-01 (×3): qty 500

## 2024-04-01 MED ORDER — SODIUM CHLORIDE 3 % IV BOLUS
250.0000 mL | Freq: Once | INTRAVENOUS | Status: AC
  Administered 2024-04-01: 250 mL via INTRAVENOUS
  Filled 2024-04-01: qty 500

## 2024-04-01 NOTE — Telephone Encounter (Signed)
 Patient's daughter called in to tell us  her dad's symptoms was getting worse and wanted to advice of what to do. He was experiencing slurred words, not being able to focus, numbness on the right side, coughing while eating and drinking and not being able to hear or see unless person or object is right in front of him. Contacted Dr. Janjua and he advised daughter to call 911 and be transported to [facility] ER.

## 2024-04-01 NOTE — Progress Notes (Signed)
 Larry Dawson                                          MRN: [id number]   04/01/2024   The [facility] Quality Team Specialist reviewed this patient medical record for the purposes of chart review for care gap closure. The following were reviewed: chart review for care gap closure-glycemic status assessment and kidney health evaluation for diabetes:eGFR  and uACR.    [facility]BCI Quality Team

## 2024-04-01 NOTE — ED Provider Notes (Addendum)
 " [facility] Provider Note   CSN: [id number] Arrival date & time: 04/01/24  1529     Patient presents with: Brain Stem Bleed   Larry Dawson is a 68 y.o. male.  With a history of intracranial hemorrhage 2 weeks ago.  Seen here no emergent intervention at that time and was discharged home.  Has been having difficulty with increasing weakness worse on the right side in the upper and lower extremity.  Also reporting some numbness in the upper lower extremity in the last 3 to 4 days.  Family is having difficulty caring for him at home.  No anticoagulation   HPI     Prior to Admission medications  Medication Sig Start Date End Date Taking? Authorizing Provider  Arginine 500 MG CAPS Take 1 capsule by mouth daily.   Yes [provider]  Ascorbic Acid (VITAMIN C PO) Take 1 tablet by mouth daily.   Yes [provider]  atenolol -chlorthalidone  (TENORETIC ) 50-25 MG tablet Take 1 tablet by mouth daily.   Yes [provider]  cetirizine  (ZYRTEC ) 10 MG tablet Take 1 tablet (10 mg total) by mouth daily. Patient taking differently: Take 10 mg by mouth daily as needed for allergies. 05/10/17  Yes Burky, Natalie B, NP  cholecalciferol (VITAMIN D) 1000 units tablet Take 1,000 Units by mouth daily.   Yes [provider]  glipiZIDE  (GLUCOTROL ) 5 MG tablet TAKE 1 TABLET BY MOUTH 2 TIMES A DAY BEFORE A MEAL 01/25/24  Yes Sagardia, Emil Schanz, MD  metFORMIN  (GLUCOPHAGE ) 500 MG tablet TAKE 1 TABLET BY MOUTH TWICE A DAY WITH A MEAL 01/13/24  Yes Sagardia, Emil Schanz, MD  Multiple Vitamins-Minerals (ZINC PO) Take 1 tablet by mouth daily.   Yes [provider]  Omega-3 Fatty Acids (FISH OIL) 1000 MG CAPS Take 1 capsule by mouth daily.   Yes [provider]  omeprazole  (PRILOSEC) 20 MG capsule Take 20 mg by mouth daily.   Yes [provider]  sildenafil  (VIAGRA ) 100 MG tablet Take 0.5-1 tablets (50-100 mg  total) by mouth daily as needed for erectile dysfunction. 10/04/20  Yes Sagardia, Miguel Jose, MD  tirzepatide  (MOUNJARO ) 5 MG/0.5ML Pen Inject 5 mg into the skin once a week. 01/06/24  Yes Sagardia, Emil Schanz, MD  atenolol  (TENORMIN ) 50 MG tablet Take 1 tablet (50 mg total) by mouth daily. Patient not taking: Reported on 04/01/2024 03/19/24   Raenelle Donalda HERO, MD  rosuvastatin  (CRESTOR ) 20 MG tablet Take 20 mg by mouth at bedtime. Patient not taking: Reported on 04/01/2024 03/19/24   [provider]    Allergies: Ace inhibitors, Penicillins, and Latex    Review of Systems  Updated Vital Signs BP (!) 145/80   Pulse 73   Temp 99 F (37.2 C) (Oral)   Resp 18   SpO2 97%   Physical Exam Vitals and nursing note reviewed.  HENT:     Head: Normocephalic and atraumatic.  Eyes:     Pupils: Pupils are equal, round, and reactive to light.  Cardiovascular:     Rate and Rhythm: Normal rate and regular rhythm.  Pulmonary:     Effort: Pulmonary effort is normal.     Breath sounds: Normal breath sounds.  Abdominal:     Palpations: Abdomen is soft.     Tenderness: There is no abdominal tenderness.  Skin:    General: Skin is warm and dry.  Neurological:     General: No focal  deficit present.     Mental Status: He is alert and oriented to person, place, and time.     Sensory: No sensory deficit.     Motor: No weakness.     Comments: Clear fluent speech  Psychiatric:        Mood and Affect: Mood normal.     (all labs ordered are listed, but only abnormal results are displayed) Labs Reviewed  URINALYSIS, W/ REFLEX TO CULTURE (INFECTION SUSPECTED) - Abnormal; Notable for the following components:      Result Value   Bacteria, UA RARE (*)    All other components within normal limits  COMPREHENSIVE METABOLIC PANEL WITH GFR - Abnormal; Notable for the following components:   Potassium 3.1 (*)    Glucose, Bld 116 (*)    Total Bilirubin 1.3 (*)    All other components within  normal limits  RESP PANEL BY RT-PCR (RSV, FLU A&B, COVID)  [id number]  CBC WITH DIFFERENTIAL/PLATELET  SODIUM  URINE DRUG SCREEN  CBG MONITORING, ED    EKG: None  Radiology: CT Head Wo Contrast Result Date: 04/01/2024 CLINICAL DATA:  History of brainstem bleed, increased or worsened mobility issues and weakness worsened speech EXAM: CT HEAD WITHOUT CONTRAST TECHNIQUE: Contiguous axial images were obtained from the base of the skull through the vertex without intravenous contrast. RADIATION DOSE REDUCTION: This exam was performed according to the departmental dose-optimization program which includes automated exposure control, adjustment of the mA and/or kV according to patient size and/or use of iterative reconstruction technique. COMPARISON:  Head CT 03/18/2024, MRI 03/16/2024, head CT 03/16/2024 FINDINGS: Brain: Focal acute hemorrhage within the pons, this now measures 2.5 x 2.3 cm on series 2, image 10, previously 1.8 x 1.6 cm. Increased surrounding hypodense edema with slightly expanded appearance of the pons. Fourth ventricle remains patent. Slight increased effacement of prepontine cistern/CSF spaces. Otherwise no interval new hemorrhage. Atrophy and chronic small vessel ischemic changes of the white matter. Chronic lacunar infarct in the basal ganglia and thalamus. No midline shift. Vascular: Mild carotid vascular calcification. Skull: Normal. Negative for fracture or focal lesion. Sinuses/Orbits: No acute finding. Other: None IMPRESSION: 1. Interval increase in size of known pontine hemorrhage, now measuring 2.5 x 2.3 cm, previously 1.8 x 1.6 cm. Increased surrounding hypodense edema with slightly expanded appearance of the pons. Slight increased effacement of prepontine cistern/CSF spaces. Fourth ventricle remains patent. 2. Atrophy and chronic small vessel ischemic changes of the white matter. Chronic lacunar infarcts in the basal ganglia and thalamus. Electronically Signed   By: Luke Bun  M.D.   On: 04/01/2024 16:17   DG Chest Portable 1 View Result Date: 04/01/2024 CLINICAL DATA:  Cough, confusion EXAM: PORTABLE CHEST 1 VIEW COMPARISON:  September 09, 2007 FINDINGS: The cardiomediastinal silhouette is unchanged in contour.Tortuous thoracic aorta. No pleural effusion. No pneumothorax. No acute pleuroparenchymal abnormality. IMPRESSION: No acute cardiopulmonary abnormality. Electronically Signed   By: Corean Salter M.D.   On: 04/01/2024 15:49     .Critical Care  Performed by: Pamella Ozell LABOR, DO Authorized by: Pamella Ozell LABOR, DO   Critical care provider statement:    Critical care time (minutes):  30   Critical care was necessary to treat or prevent imminent or life-threatening deterioration of the following conditions:  CNS failure or compromise   Critical care was time spent personally by me on the following activities:  Development of treatment plan with patient or surrogate, discussions with consultants, evaluation of patient's response to treatment, examination of  patient, ordering and review of laboratory studies, ordering and review of radiographic studies, ordering and performing treatments and interventions, pulse oximetry, re-evaluation of patient's condition, review of old charts and obtaining history from patient or surrogate   I assumed direction of critical care for this patient from another provider in my specialty: no     Care discussed with: admitting provider      Medications Ordered in the ED  sodium chloride  3% (hypertonic) IV bolus 250 mL (0 mLs Intravenous Stopped 04/01/24 2011)    Clinical Course as of 04/01/24 2242  Wed Apr 01, 2024  1812 CT head shows interval increase in bleeding.  Discussed with Dr. Clois (neurosurgery on-call).   No need for emergent neurosurgical intervention at this time.  He recommends admission to neuro ICU under neurology service.  SBP goal <160 [MP]  1845 Discussed with Dr. Lindzen (neurology) who will admit patient to  neuro ICU [MP]  2058 I did discuss the case further with Dr. Towanda (oncoming evening neurologist) who is evaluate the patient personally.  He has also spoken with the neurosurgery team.  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  Patient would benefit from admission to medicine progressive floor.  Dr. Janjua is aware and will evaluate the patient early on Friday morning once bleeding and swelling have hopefully subsided.  There may be opportunity for neurosurgical intervention at that time [MP]  2241 Discussed with Dr. Alfornia who will accept patient for admission to progressive bed. [MP]    Clinical Course User Index [MP] Pamella Ozell LABOR, DO                                 Medical Decision Making 68 year old male with history as above seen here for recent intracranial hemorrhage.  No neurosurgical intervention when that happened 2 weeks ago.  Has been recovering home but worsening right-sided weakness and family is having difficulty caring for him at home.  No focal neurologic deficits on my exam.  Will obtain repeat imaging to observe interval change of known brainstem hemorrhage basic laboratory workup and reach out to neurosurgery after imaging has resulted for further recommendation  Amount and/or Complexity of Data Reviewed Labs: ordered. Radiology: ordered.  Risk Decision regarding hospitalization.        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

## 2024-04-01 NOTE — H&P (Signed)
 " History and Physical    Larry Dawson FMW:[id number] DOB: Aug 11, 1955 DOA: 04/01/2024  PCP: [name], MD  Patient coming from: Home  Chief Complaint: Trouble walking  HPI: Larry Dawson is a 68 y.o. male with medical history significant of COPD, hypertension, hyperlipidemia, type 2 diabetes.  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  Patient presents to the ED today with complaints of worsening right upper and lower extremity weakness/numbness, ataxia, intermittent diplopia, slurring of speech, and difficulty swallowing.  Also having mild headaches.  Not on anticoagulation or antiplatelet agents.  Denies nausea or vomiting.  Denies chest pain or shortness of breath.  No other complaints.  ED Course: Vital signs stable on arrival.  CBC unremarkable.  CMP noted potassium 3.1, glucose 116, T. bili 1.3.  UA not suggestive of infection.  UDS negative.  COVID/influenza/RSV PCR negative.    Chest x-ray showing no acute cardiopulmonary abnormality.    CT head showing interval increase in size of known pontine hemorrhage with increased surrounding hypodense edema with slightly expanded appearance of the pons.  Slight increased effacement of prepontine cistern/CSF spaces.  Both neurology and neurosurgery were consulted by EDP.  Neurology had discussions with both neuroradiology and neurosurgery team and suspecting that this is likely a rebleed into potential vascular malformation.  Recommended admission to progressive care unit by hospitalist service.  Neurology had initially ordered hypertonic saline which was later stopped and recommending sodium checks every 6 hours x 2 for now to ensure no hypernatremia.  Neurosurgery will evaluate the patient on Friday morning and will plan for surgery in a few weeks once bleeding and swelling have hopefully subsided.  Brain MRI  ordered.  Review of Systems:  Review of Systems  All other systems reviewed and are negative.   Past Medical History:  Diagnosis Date   Allergy    Anal fissure    COPD (chronic obstructive pulmonary disease) (HCC)    Albuterol  PRN.   Hydrocele    Hyperlipidemia    Hypertension    IBS (irritable bowel syndrome) 05/22/13   per chart note-pt denied    Past Surgical History:  Procedure Laterality Date   HERNIA REPAIR  prior to 2013   HYDROCELE EXCISION Bilateral 11/23/2013   Procedure: HYDROCELECTOMY ADULT;  Surgeon: Alm GORMAN Fragmin, MD;  Location: [facility];  Service: Urology;  Laterality: Bilateral;   VASECTOMY  prior to 2013     reports that he has quit smoking. His smoking use included cigarettes. He has never used smokeless tobacco. He reports that he does not currently use alcohol. He reports that he does not use drugs.  Allergies[1]  Family History  Problem Relation Age of Onset   Diabetes Father    Hypertension Father    Diabetes Brother    Hypertension Brother    Diabetes Brother    Diabetes Brother     Prior to Admission medications  Medication Sig Start Date End Date Taking? Authorizing Provider  Arginine 500 MG CAPS Take 1 capsule by mouth daily.   Yes [provider]  Ascorbic Acid (VITAMIN C PO) Take 1 tablet by mouth daily.   Yes [provider]  atenolol -chlorthalidone  (TENORETIC ) 50-25 MG tablet Take 1 tablet by mouth daily.   Yes [provider]  cetirizine  (ZYRTEC ) 10 MG tablet Take 1 tablet (10 mg total) by mouth daily. Patient taking differently: Take 10 mg by mouth daily as  needed for allergies. 05/10/17  Yes Burky, Natalie B, NP  cholecalciferol (VITAMIN D) 1000 units tablet Take 1,000 Units by mouth daily.   Yes [provider]  glipiZIDE  (GLUCOTROL ) 5 MG tablet TAKE 1 TABLET BY MOUTH 2 TIMES A DAY BEFORE A MEAL 01/25/24  Yes Sagardia, Emil Schanz, MD  metFORMIN  (GLUCOPHAGE ) 500 MG tablet TAKE 1  TABLET BY MOUTH TWICE A DAY WITH A MEAL 01/13/24  Yes Sagardia, Emil Schanz, MD  Multiple Vitamins-Minerals (ZINC PO) Take 1 tablet by mouth daily.   Yes [provider]  Omega-3 Fatty Acids (FISH OIL) 1000 MG CAPS Take 1 capsule by mouth daily.   Yes [provider]  omeprazole  (PRILOSEC) 20 MG capsule Take 20 mg by mouth daily.   Yes [provider]  sildenafil  (VIAGRA ) 100 MG tablet Take 0.5-1 tablets (50-100 mg total) by mouth daily as needed for erectile dysfunction. 10/04/20  Yes Sagardia, Miguel Jose, MD  tirzepatide  (MOUNJARO ) 5 MG/0.5ML Pen Inject 5 mg into the skin once a week. 01/06/24  Yes Sagardia, Emil Schanz, MD  atenolol  (TENORMIN ) 50 MG tablet Take 1 tablet (50 mg total) by mouth daily. Patient not taking: Reported on 04/01/2024 03/19/24   Raenelle Donalda HERO, MD  rosuvastatin  (CRESTOR ) 20 MG tablet Take 20 mg by mouth at bedtime. Patient not taking: Reported on 04/01/2024 03/19/24   [provider]    Physical Exam: Vitals:   04/01/24 2244 04/01/24 2245 04/01/24 2246 04/01/24 2300  BP:      Pulse: 71 72 73 74  Resp: 17 17 19 17   Temp:      TempSrc:      SpO2: 100% 99% 96% 97%    Physical Exam Vitals reviewed.  Constitutional:      General: He is not in acute distress. HENT:     Head: Normocephalic and atraumatic.  Eyes:     Extraocular Movements: Extraocular movements intact.     Pupils: Pupils are equal, round, and reactive to light.  Cardiovascular:     Rate and Rhythm: Normal rate and regular rhythm.     Heart sounds: Normal heart sounds.  Pulmonary:     Effort: Pulmonary effort is normal. No respiratory distress.     Breath sounds: Normal breath sounds.  Abdominal:     General: Bowel sounds are normal.     Palpations: Abdomen is soft.     Tenderness: There is no abdominal tenderness. There is no guarding.  Musculoskeletal:     Cervical back: Normal range of motion.     Right lower leg: No edema.     Left lower leg: No  edema.  Skin:    General: Skin is warm and dry.  Neurological:     General: No focal deficit present.     Mental Status: He is alert and oriented to person, place, and time.     Cranial Nerves: No cranial nerve deficit.     Sensory: No sensory deficit.     Motor: No weakness.     Labs on Admission: I have personally reviewed following labs and imaging studies  CBC: Recent Labs  Lab 04/01/24 1631  WBC 10.4  NEUTROABS 6.9  HGB 15.6  HCT 45.9  MCV 82.3  PLT 320   Basic Metabolic Panel: Recent Labs  Lab 04/01/24 1740 04/01/24 1927  NA 136 136  K 3.1*  --   CL 98  --   CO2 27  --   GLUCOSE 116*  --  BUN 15  --   CREATININE 1.20  --   CALCIUM  10.1  --    GFR: Estimated Creatinine Clearance: 71.2 mL/min (by C-G formula based on SCr of 1.2 mg/dL). Liver Function Tests: Recent Labs  Lab 04/01/24 1740  AST 20  ALT 20  ALKPHOS 58  BILITOT 1.3*  PROT 7.6  ALBUMIN 4.1   No results for input(s): LIPASE, AMYLASE in the last 168 hours. No results for input(s): AMMONIA in the last 168 hours. Coagulation Profile: No results for input(s): INR, PROTIME in the last 168 hours. Cardiac Enzymes: No results for input(s): CKTOTAL, CKMB, CKMBINDEX, TROPONINI in the last 168 hours. BNP (last 3 results) No results for input(s): PROBNP in the last 8760 hours. HbA1C: No results for input(s): HGBA1C in the last 72 hours. CBG: Recent Labs  Lab 04/01/24 1535  GLUCAP 84   Lipid Profile: No results for input(s): CHOL, HDL, LDLCALC, TRIG, CHOLHDL, LDLDIRECT in the last 72 hours. Thyroid  Function Tests: No results for input(s): TSH, T4TOTAL, FREET4, T3FREE, THYROIDAB in the last 72 hours. Anemia Panel: No results for input(s): VITAMINB12, FOLATE, FERRITIN, TIBC, IRON, RETICCTPCT in the last 72 hours. Urine analysis:    Component Value Date/Time   COLORURINE YELLOW 04/01/2024 2028   APPEARANCEUR CLEAR 04/01/2024 2028    LABSPEC 1.018 04/01/2024 2028   PHURINE 5.0 04/01/2024 2028   GLUCOSEU NEGATIVE 04/01/2024 2028   HGBUR NEGATIVE 04/01/2024 2028   BILIRUBINUR NEGATIVE 04/01/2024 2028   KETONESUR NEGATIVE 04/01/2024 2028   PROTEINUR NEGATIVE 04/01/2024 2028   NITRITE NEGATIVE 04/01/2024 2028   LEUKOCYTESUR NEGATIVE 04/01/2024 2028    Radiological Exams on Admission: CT Head Wo Contrast Result Date: 04/01/2024 CLINICAL DATA:  History of brainstem bleed, increased or worsened mobility issues and weakness worsened speech EXAM: CT HEAD WITHOUT CONTRAST TECHNIQUE: Contiguous axial images were obtained from the base of the skull through the vertex without intravenous contrast. RADIATION DOSE REDUCTION: This exam was performed according to the departmental dose-optimization program which includes automated exposure control, adjustment of the mA and/or kV according to patient size and/or use of iterative reconstruction technique. COMPARISON:  Head CT 03/18/2024, MRI 03/16/2024, head CT 03/16/2024 FINDINGS: Brain: Focal acute hemorrhage within the pons, this now measures 2.5 x 2.3 cm on series 2, image 10, previously 1.8 x 1.6 cm. Increased surrounding hypodense edema with slightly expanded appearance of the pons. Fourth ventricle remains patent. Slight increased effacement of prepontine cistern/CSF spaces. Otherwise no interval new hemorrhage. Atrophy and chronic small vessel ischemic changes of the white matter. Chronic lacunar infarct in the basal ganglia and thalamus. No midline shift. Vascular: Mild carotid vascular calcification. Skull: Normal. Negative for fracture or focal lesion. Sinuses/Orbits: No acute finding. Other: None IMPRESSION: 1. Interval increase in size of known pontine hemorrhage, now measuring 2.5 x 2.3 cm, previously 1.8 x 1.6 cm. Increased surrounding hypodense edema with slightly expanded appearance of the pons. Slight increased effacement of prepontine cistern/CSF spaces. Fourth ventricle remains  patent. 2. Atrophy and chronic small vessel ischemic changes of the white matter. Chronic lacunar infarcts in the basal ganglia and thalamus. Electronically Signed   By: Luke Bun M.D.   On: 04/01/2024 16:17   DG Chest Portable 1 View Result Date: 04/01/2024 CLINICAL DATA:  Cough, confusion EXAM: PORTABLE CHEST 1 VIEW COMPARISON:  September 09, 2007 FINDINGS: The cardiomediastinal silhouette is unchanged in contour.Tortuous thoracic aorta. No pleural effusion. No pneumothorax. No acute pleuroparenchymal abnormality. IMPRESSION: No acute cardiopulmonary abnormality. Electronically Signed   By: Corean  Peacock M.D.   On: 04/01/2024 15:49    EKG: Independently reviewed.  Sinus rhythm, no significant change since previous tracing.  Assessment and Plan  Pontine hemorrhage Recent hospital admission 12/8-12/10 for this problem.  Now presenting with complaints of worsening right-sided weakness/numbness, ataxia, intermittent diplopia, slurring of speech, and difficulty swallowing.  Endorsing mild headaches.  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  Both neurology and neurosurgery have been consulted.  Neurology initially ordered hypertonic saline which was later stopped and recommending sodium checks every 6 hours x 2 for now to ensure no hypernatremia.  Neurology feels that the patient can be admitted to progressive care unit.  Patient is not on anticoagulation or antiplatelet agents.  Keep SBP below 140.  Neurology ordered repeat head CT for 4 AM to ensure stability of the noted hemorrhage.  Keep n.p.o. for now.  Hypertension Keep SBP below 140 per neurology.  IV hydralazine  PRN ordered.  Mild hypokalemia Replace potassium and continue to monitor labs.  COPD Stable, no signs of acute exacerbation.  Does not appear to be on inhalers at home.  Type 2  diabetes Hemoglobin A1c 7.2 on 01/06/2024.  Placed on sensitive sliding scale insulin  every 4 hours.  DVT prophylaxis: SCDs Code Status: Full Code (discussed with the patient) Family Communication: Wife at bedside. Level of care: Progressive Care Unit Admission status: It is my clinical opinion that admission to INPATIENT is reasonable and necessary because of the expectation that this patient will require hospital care that crosses at least 2 midnights to treat this condition based on the medical complexity of the problems presented.  Given the aforementioned information, the predictability of an adverse outcome is felt to be significant.  Editha Ram MD [facility]  If 7PM-7AM, please contact night-coverage [email or web address]  04/01/2024, 11:52 PM       [1]  Allergies Allergen Reactions   Ace Inhibitors Swelling    Suspected angioedema   Penicillins Anaphylaxis, Hives, Rash and Other (See Comments)    Throat swells   Latex Hives, Swelling and Rash   "

## 2024-04-01 NOTE — ED Notes (Signed)
 Patient transported to MRI

## 2024-04-01 NOTE — H&P (Incomplete)
 " History and Physical    Larry Dawson FMW:[id number] DOB: [birth year] DOA: 04/01/2024  PCP: [name], MD  Patient coming from: {Blank single:19197::Home,SNF,ALF/ILF,Group Home,BHH,CIR,Hospice,Homeless,[facility] ED,[facility] ED,Outside Hospital,***}  Chief Complaint: ***  HPI: Larry Dawson is a 68 y.o. male with medical history significant of COPD, hypertension, hyperlipidemia, type 2 diabetes.  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  Patient presents to the ED today due to worsening right-sided weakness/numbness, intermittent diplopia, slurring of speech, and difficulty swallowing.   Both neurology and neurosurgery were consulted.  Neurology had discussions with both neuroradiology and neurosurgery team and suspecting that this is likely a rebleed into potential vascular malformation.  Recommended admission to progressive care unit by hospitalist service.  Neurosurgery will evaluate the patient on Friday morning once bleeding and swelling have hopefully subsided and there may be opportunity for neurosurgical intervention at that time.   =  ED Course: ***  Review of Systems:  ROS  Past Medical History:  Diagnosis Date   Allergy    Anal fissure    COPD (chronic obstructive pulmonary disease) (HCC)    Albuterol  PRN.   Hydrocele    Hyperlipidemia    Hypertension    IBS (irritable bowel syndrome) 05/22/13   per chart note-pt denied    Past Surgical History:  Procedure Laterality Date   HERNIA REPAIR  prior to 2013   HYDROCELE EXCISION Bilateral 11/23/2013   Procedure: HYDROCELECTOMY ADULT;  Surgeon: Alm GORMAN Fragmin, MD;  Location: [facility];  Service: Urology;  Laterality: Bilateral;   VASECTOMY  prior to 2013     reports that he has quit smoking. His smoking use included cigarettes. He has never used smokeless  tobacco. He reports that he does not currently use alcohol. He reports that he does not use drugs.  Allergies[1]  Family History  Problem Relation Age of Onset   Diabetes Father    Hypertension Father    Diabetes Brother    Hypertension Brother    Diabetes Brother    Diabetes Brother     Prior to Admission medications  Medication Sig Start Date End Date Taking? Authorizing Provider  Arginine 500 MG CAPS Take 1 capsule by mouth daily.   Yes [provider]  Ascorbic Acid (VITAMIN C PO) Take 1 tablet by mouth daily.   Yes [provider]  atenolol -chlorthalidone  (TENORETIC ) 50-25 MG tablet Take 1 tablet by mouth daily.   Yes [provider]  cetirizine  (ZYRTEC ) 10 MG tablet Take 1 tablet (10 mg total) by mouth daily. Patient taking differently: Take 10 mg by mouth daily as needed for allergies. 05/10/17  Yes Burky, Natalie B, NP  cholecalciferol (VITAMIN D) 1000 units tablet Take 1,000 Units by mouth daily.   Yes [provider]  glipiZIDE  (GLUCOTROL ) 5 MG tablet TAKE 1 TABLET BY MOUTH 2 TIMES A DAY BEFORE A MEAL 01/25/24  Yes Sagardia, Emil Schanz, MD  metFORMIN  (GLUCOPHAGE ) 500 MG tablet TAKE 1 TABLET BY MOUTH TWICE A DAY WITH A MEAL 01/13/24  Yes Sagardia, Emil Schanz, MD  Multiple Vitamins-Minerals (ZINC PO) Take 1 tablet by mouth daily.   Yes [provider]  Omega-3 Fatty Acids (FISH OIL) 1000 MG CAPS Take 1 capsule by mouth daily.   Yes [provider]  omeprazole  (PRILOSEC) 20 MG capsule Take 20 mg by mouth daily.   Yes [provider]  sildenafil  (VIAGRA ) 100 MG tablet Take 0.5-1  tablets (50-100 mg total) by mouth daily as needed for erectile dysfunction. 10/04/20  Yes Sagardia, Miguel Jose, MD  tirzepatide  (MOUNJARO ) 5 MG/0.5ML Pen Inject 5 mg into the skin once a week. 01/06/24  Yes Sagardia, Emil Schanz, MD  atenolol  (TENORMIN ) 50 MG tablet Take 1 tablet (50 mg total) by mouth daily. Patient not taking: Reported  on 04/01/2024 03/19/24   Raenelle Donalda HERO, MD  rosuvastatin  (CRESTOR ) 20 MG tablet Take 20 mg by mouth at bedtime. Patient not taking: Reported on 04/01/2024 03/19/24   [provider]    Physical Exam: Vitals:   04/01/24 2244 04/01/24 2245 04/01/24 2246 04/01/24 2300  BP:      Pulse: 71 72 73 74  Resp: 17 17 19 17   Temp:      TempSrc:      SpO2: 100% 99% 96% 97%    Physical Exam  Labs on Admission: I have personally reviewed following labs and imaging studies  CBC: Recent Labs  Lab 04/01/24 1631  WBC 10.4  NEUTROABS 6.9  HGB 15.6  HCT 45.9  MCV 82.3  PLT 320   Basic Metabolic Panel: Recent Labs  Lab 04/01/24 1740 04/01/24 1927  NA 136 136  K 3.1*  --   CL 98  --   CO2 27  --   GLUCOSE 116*  --   BUN 15  --   CREATININE 1.20  --   CALCIUM  10.1  --    GFR: Estimated Creatinine Clearance: 71.2 mL/min (by C-G formula based on SCr of 1.2 mg/dL). Liver Function Tests: Recent Labs  Lab 04/01/24 1740  AST 20  ALT 20  ALKPHOS 58  BILITOT 1.3*  PROT 7.6  ALBUMIN 4.1   No results for input(s): LIPASE, AMYLASE in the last 168 hours. No results for input(s): AMMONIA in the last 168 hours. Coagulation Profile: No results for input(s): INR, PROTIME in the last 168 hours. Cardiac Enzymes: No results for input(s): CKTOTAL, CKMB, CKMBINDEX, TROPONINI in the last 168 hours. BNP (last 3 results) No results for input(s): PROBNP in the last 8760 hours. HbA1C: No results for input(s): HGBA1C in the last 72 hours. CBG: Recent Labs  Lab 04/01/24 1535  GLUCAP 84   Lipid Profile: No results for input(s): CHOL, HDL, LDLCALC, TRIG, CHOLHDL, LDLDIRECT in the last 72 hours. Thyroid  Function Tests: No results for input(s): TSH, T4TOTAL, FREET4, T3FREE, THYROIDAB in the last 72 hours. Anemia Panel: No results for input(s): VITAMINB12, FOLATE, FERRITIN, TIBC, IRON, RETICCTPCT in the last 72 hours. Urine  analysis:    Component Value Date/Time   COLORURINE YELLOW 04/01/2024 2028   APPEARANCEUR CLEAR 04/01/2024 2028   LABSPEC 1.018 04/01/2024 2028   PHURINE 5.0 04/01/2024 2028   GLUCOSEU NEGATIVE 04/01/2024 2028   HGBUR NEGATIVE 04/01/2024 2028   BILIRUBINUR NEGATIVE 04/01/2024 2028   KETONESUR NEGATIVE 04/01/2024 2028   PROTEINUR NEGATIVE 04/01/2024 2028   NITRITE NEGATIVE 04/01/2024 2028   LEUKOCYTESUR NEGATIVE 04/01/2024 2028    Radiological Exams on Admission: CT Head Wo Contrast Result Date: 04/01/2024 CLINICAL DATA:  History of brainstem bleed, increased or worsened mobility issues and weakness worsened speech EXAM: CT HEAD WITHOUT CONTRAST TECHNIQUE: Contiguous axial images were obtained from the base of the skull through the vertex without intravenous contrast. RADIATION DOSE REDUCTION: This exam was performed according to the departmental dose-optimization program which includes automated exposure control, adjustment of the mA and/or kV according to patient size and/or use of iterative reconstruction technique. COMPARISON:  Head CT 03/18/2024,  MRI 03/16/2024, head CT 03/16/2024 FINDINGS: Brain: Focal acute hemorrhage within the pons, this now measures 2.5 x 2.3 cm on series 2, image 10, previously 1.8 x 1.6 cm. Increased surrounding hypodense edema with slightly expanded appearance of the pons. Fourth ventricle remains patent. Slight increased effacement of prepontine cistern/CSF spaces. Otherwise no interval new hemorrhage. Atrophy and chronic small vessel ischemic changes of the white matter. Chronic lacunar infarct in the basal ganglia and thalamus. No midline shift. Vascular: Mild carotid vascular calcification. Skull: Normal. Negative for fracture or focal lesion. Sinuses/Orbits: No acute finding. Other: None IMPRESSION: 1. Interval increase in size of known pontine hemorrhage, now measuring 2.5 x 2.3 cm, previously 1.8 x 1.6 cm. Increased surrounding hypodense edema with slightly  expanded appearance of the pons. Slight increased effacement of prepontine cistern/CSF spaces. Fourth ventricle remains patent. 2. Atrophy and chronic small vessel ischemic changes of the white matter. Chronic lacunar infarcts in the basal ganglia and thalamus. Electronically Signed   By: Luke Bun M.D.   On: 04/01/2024 16:17   DG Chest Portable 1 View Result Date: 04/01/2024 CLINICAL DATA:  Cough, confusion EXAM: PORTABLE CHEST 1 VIEW COMPARISON:  September 09, 2007 FINDINGS: The cardiomediastinal silhouette is unchanged in contour.Tortuous thoracic aorta. No pleural effusion. No pneumothorax. No acute pleuroparenchymal abnormality. IMPRESSION: No acute cardiopulmonary abnormality. Electronically Signed   By: Corean Salter M.D.   On: 04/01/2024 15:49    EKG: Independently reviewed. ***  Assessment and Plan   MRI Brain with and without contrast. - Hypertonic saline that was started by me earlier was discontinued. Would recommend checking Sodium Q6H x 2 to ensure no hypernatremia. - avoid anticoagulation, DVT prophylaxis and antiplatelet. - Keep SBP below 140. - admit to progressive service under medicine team. - Neurosurgery to evaluate him on Friday. - repeat CT Head in AM to ensure stability of the noted hemorrhage.  DVT prophylaxis: {Blank single:19197::Lovenox ,SQ Heparin,IV heparin gtt,Xarelto,Eliquis,Coumadin,SCDs,***} Code Status: {Blank single:19197::Full Code,DNR,DNR/DNI,Comfort Care,***} Family Communication: ***  Consults called: ***  Level of care: {Blank single:19197::Med-Surg,Telemetry bed,Progressive Care Unit,Step Down Unit} Admission status: *** Time Spent: 75+ minutes***  Editha Ram MD [facility]  If 7PM-7AM, please contact night-coverage [email or web address]  04/01/2024, 11:52 PM        [1] Allergies Allergen Reactions   Ace Inhibitors Swelling    Suspected angioedema   Penicillins Anaphylaxis, Hives, Rash  and Other (See Comments)    Throat swells   Latex Hives, Swelling and Rash  "

## 2024-04-01 NOTE — ED Triage Notes (Signed)
 Patient BIB EMS from home c/o worsening stroke symptoms. Patient was diagnosed 2 weeks ago with a brain stem bleed. Symptoms have been getting progressively worse. Patient is having mobility issues on the right side with weakness and numbness. Patient's speech is getting worse. Patient has also been having swallowing issues for about a week now.

## 2024-04-01 NOTE — Consult Note (Signed)
 NEUROLOGY CONSULT NOTE   Date of service: April 01, 2024 Patient Name: Larry Dawson MRN:  [id number] DOB:  [birth year] Chief Complaint: Pontine hemorrhage Requesting Provider: Pamella Ozell LABOR, DO  History of Present Illness  Larry Dawson is a 68 y.o. male with hx of COPD, HTN, HLD, IBS, hydrocele who initially presented to [facility] on 03/16/24 with feeling off balance for several days, head numbness for 4-5 days. 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 He was admitted to [facility]. He had MRI Brain with and without contrast which showed stable IPH. Repeat CT Head 2 days later along with CTA head and neck shows stable IPH with no vascular malformation or aneurysm.  Wife and daughter at the bedside reports a gradual decline over the last couple of weeks. Now unable to stand and very unsteady on his feet, R side giving him trouble, difficulties with swallowing, coughing with food intake, and slurring his speech and intermittent diplopia.  Repeat CT Head today with increase in the size of pontine hemorrhage.  Neurology consulted for further evaluation and workup.   ROS  Comprehensive ROS performed and pertinent positives documented in HPI   Past History   Past Medical History:  Diagnosis Date   Allergy    Anal fissure    COPD (chronic obstructive pulmonary disease) (HCC)    Albuterol  PRN.   Hydrocele    Hyperlipidemia    Hypertension    IBS (irritable bowel syndrome) 05/22/13   per chart note-pt denied    Past Surgical History:  Procedure Laterality Date   HERNIA REPAIR  prior to 2013   HYDROCELE EXCISION Bilateral 11/23/2013   Procedure: HYDROCELECTOMY ADULT;  Surgeon: Alm GORMAN Fragmin, MD;  Location: [facility];  Service: Urology;  Laterality: Bilateral;   VASECTOMY  prior to 2013    Family History: Family History  Problem Relation Age of Onset   Diabetes Father    Hypertension Father    Diabetes Brother     Hypertension Brother    Diabetes Brother    Diabetes Brother     Social History  reports that he has quit smoking. His smoking use included cigarettes. He has never used smokeless tobacco. He reports that he does not currently use alcohol. He reports that he does not use drugs.  Allergies[1]  Medications  Current Medications[2]  Vitals   Vitals:   04/01/24 1904 04/01/24 1915 04/01/24 1930 04/01/24 1945  BP:  (!) 145/91 (!) 142/81 (!) 145/80  Pulse:  78 72 71  Resp:  16 18 18   Temp: 99 F (37.2 C)     TempSrc: Oral     SpO2:  100% 98% 98%    There is no height or weight on file to calculate BMI.   Physical Exam   General: Laying comfortably in bed; in no acute distress.  HENT: Normal oropharynx and mucosa. Normal external appearance of ears and nose.  Neck: Supple, no pain or tenderness  CV: No JVD. No peripheral edema.  Pulmonary: Symmetric Chest rise. Normal respiratory effort.  Abdomen: Soft to touch, non-tender.  Ext: No cyanosis, edema, or deformity  Skin: No rash. Normal palpation of skin.   Musculoskeletal: Normal digits and nails by inspection. No clubbing.   Neurologic Examination  Mental status/Cognition: Alert, oriented to self, place, month and year, good attention.  Speech/language: slurred speech, fluent, comprehension intact, object naming intact, repetition intact.  Cranial nerves:   CN II Pupils  equal and reactive to light, no VF deficits   CN III,IV,VI EOM intact with ?mild dysconjugate gaze noted to right horizaontal gaze, no gaze preference or deviation, has direction changing nystagmus to horizontal gaze.   CN V Slight numbness to light touch in lower face on the right.   CN VII Mild R facial droop, more noticeable with speech   CN VIII normal hearing to speech   CN IX & X normal palatal elevation, no uvular deviation.   CN XI 5/5 head turn and 5/5 shoulder shrug bilaterally    CN XII midline tongue protrusion   Motor:  Muscle bulk:  normal, tone normal. Mvmt Root Nerve  Muscle Right Left Comments  SA C5/6 Ax Deltoid 5 5   EF C5/6 Mc Biceps 5 5   EE C6/7/8 Rad Triceps 5 5   WF C6/7 Med FCR     WE C7/8 PIN ECU     F Ab C8/T1 U ADM/FDI 4+ 5   HF L1/2/3 Fem Illopsoas 4+ 4+   KE L2/3/4 Fem Quad 5 5   DF L4/5 D Peron Tib Ant 5 5   PF S1/2 Tibial Grc/Sol 4+ 5    Sensation:  Light touch Subjective pins and needle sensation on entire right side but objectively intact   Pin prick    Temperature    Vibration Intact BL  Proprioception    Coordination/Complex Motor:  - Finger to Nose with slight ataxia in BL uppers  - Heel to shin with significant ataxia in BL lowers - Rapid alternating movement are slowed on the right compared to left - Gait: deferred for patient safety.   Labs/Imaging/Neurodiagnostic studies   CBC:  Recent Labs  Lab 2024-04-02 1631  WBC 10.4  NEUTROABS 6.9  HGB 15.6  HCT 45.9  MCV 82.3  PLT 320   Basic Metabolic Panel:  Lab Results  Component Value Date   NA 136 Apr 02, 2024   K 3.1 (L) April 02, 2024   CO2 27 April 02, 2024   GLUCOSE 116 (H) 04-02-24   BUN 15 04-02-2024   CREATININE 1.20 02-Apr-2024   CALCIUM  10.1 Apr 02, 2024   GFRNONAA >60 04/02/24   GFRAA 69 05/25/2020   Lipid Panel:  Lab Results  Component Value Date   LDLCALC 56 03/18/2024   HgbA1c:  Lab Results  Component Value Date   HGBA1C 7.2 (A) 01/06/2024   Urine Drug Screen: No results found for: LABOPIA, COCAINSCRNUR, LABBENZ, AMPHETMU, THCU, LABBARB  Alcohol Level No results found for: ETH INR No results found for: INR APTT No results found for: APTT AED levels: No results found for: PHENYTOIN, ZONISAMIDE, LAMOTRIGINE, LEVETIRACETA  CT Head without contrast(Personally reviewed): 1. Interval increase in size of known pontine hemorrhage, now measuring 2.5 x 2.3 cm, previously 1.8 x 1.6 cm. Increased surrounding hypodense edema with slightly expanded appearance of the pons. Slight increased  effacement of prepontine cistern/CSF spaces. Fourth ventricle remains patent. 2. Atrophy and chronic small vessel ischemic changes of the white matter. Chronic lacunar infarcts in the basal ganglia and thalamus.  CT angio Head and Neck with contrast 03/18/24(Personally reviewed): 1. Unchanged parenchymal hemorrhage in the pons. 2. Mild atherosclerosis without a large vessel occlusion, significant proximal stenosis, aneurysm, or vascular malformation in the head or neck.  MRI Brain12/8/25(Personally reviewed): 1. Stable intraparenchymal hemorrhage in the pons. No visible surrounding infarct or enhancement to suggest a mass, but acute blood products limits assessment. A follow up MRI with contrast in approximately 4-6 months could exclude a mass.  ASSESSMENT  Larry Dawson is a 68 y.o. male with hx of COPD, HTN, HLD, IBS, hydrocele who initially presented to [facility] on 03/16/24 with feeling off balance for several days, head numbness for 4-5 days. He was found to have expansion of the earlier noted pontine hemorrhage.  The bleed appears odd for a primary hemorrhage and I had extensive discussion with neuro radiology and with neurosurgery teams. It is atypical for a primary hemorrhage or hemorrhagic transformation of infarct to rebleed a couple weeks later and expand this way. There is also very little associated cytotoxic edema. Unlikely to be hemorrhagic met given lack of associated vasogenic edema and no lesions anywhere else in the brain concerning for mets. Suspect this is likely a rebleed into a potential vascular malformation.  RECOMMENDATIONS  - MRI Brain with and without contrast. - Hypertonic saline that was started by me earlier was discontinued. Would recommend checking Sodium Q6H x 2 to ensure no hypernatremia. - avoid anticoagulation, DVT prophylaxis and antiplatelet. - Keep SBP below 140. - admit to progressive service under medicine team. - Neurosurgery to evaluate him on  Friday. - repeat CT Head in AM to ensure stability of the noted hemorrhage. ______________________________________________________________________  Plan discussed with patient, wife and daughter at the bedside.  I personally spent a total of 80 minutes in the care of the patient today including preparing to see the patient, getting/reviewing separately obtained history, performing a medically appropriate exam/evaluation, counseling and educating, placing orders, referring and communicating with other health care professionals, documenting clinical information in the EHR, independently interpreting results, communicating results, and coordinating care.   Signed, Viviano Bir, MD [facility]     [1]  Allergies Allergen Reactions   Ace Inhibitors Swelling    Suspected angioedema   Penicillins Anaphylaxis, Hives, Rash and Other (See Comments)    Throat swells   Latex Hives, Swelling and Rash  [2]  Current Facility-Administered Medications:    sodium chloride  (hypertonic) 3 % solution, , Intravenous, Continuous, Jodell Weitman, MD, Last Rate: 75 mL/hr at 04/01/24 1941, New Bag at 04/01/24 1941   sodium chloride  3% (hypertonic) IV bolus 250 mL, 250 mL, Intravenous, Once, Raynetta Osterloh, MD, Last Rate: 500 mL/hr at 04/01/24 1941, 250 mL at 04/01/24 1941  Current Outpatient Medications:    Ascorbic Acid (VITAMIN C PO), Take 1 tablet by mouth daily., Disp: , Rfl:    atenolol  (TENORMIN ) 50 MG tablet, Take 1 tablet (50 mg total) by mouth daily., Disp: 30 tablet, Rfl: 1   cetirizine  (ZYRTEC ) 10 MG tablet, Take 1 tablet (10 mg total) by mouth daily., Disp: 30 tablet, Rfl: 0   cholecalciferol (VITAMIN D) 1000 units tablet, Take 1,000 Units by mouth daily., Disp: , Rfl:    glipiZIDE  (GLUCOTROL ) 5 MG tablet, TAKE 1 TABLET BY MOUTH 2 TIMES A DAY BEFORE A MEAL, Disp: 60 tablet, Rfl: 3   metFORMIN  (GLUCOPHAGE ) 500 MG tablet, TAKE 1 TABLET BY MOUTH TWICE A DAY WITH A MEAL, Disp:  180 tablet, Rfl: 3   Multiple Vitamins-Minerals (ZINC PO), Take 1 tablet by mouth daily., Disp: , Rfl:    Omega-3 Fatty Acids (FISH OIL) 1000 MG CAPS, Take 1 capsule by mouth daily., Disp: , Rfl:    rosuvastatin  (CRESTOR ) 20 MG tablet, Take 20 mg by mouth at bedtime., Disp: , Rfl:    sildenafil  (VIAGRA ) 100 MG tablet, Take 0.5-1 tablets (50-100 mg total) by mouth daily as needed for erectile dysfunction., Disp: 5 tablet, Rfl: 11   tirzepatide  (MOUNJARO ) 5  MG/0.5ML Pen, Inject 5 mg into the skin once a week., Disp: 6 mL, Rfl: 3   Turmeric POWD, Take by mouth daily., Disp: , Rfl:

## 2024-04-01 NOTE — ED Notes (Signed)
 Patient transported to CT

## 2024-04-02 ENCOUNTER — Inpatient Hospital Stay (HOSPITAL_COMMUNITY)

## 2024-04-02 DIAGNOSIS — Z8249 Family history of ischemic heart disease and other diseases of the circulatory system: Secondary | ICD-10-CM | POA: Diagnosis not present

## 2024-04-02 DIAGNOSIS — G8191 Hemiplegia, unspecified affecting right dominant side: Secondary | ICD-10-CM | POA: Diagnosis present

## 2024-04-02 DIAGNOSIS — Z87891 Personal history of nicotine dependence: Secondary | ICD-10-CM | POA: Diagnosis not present

## 2024-04-02 DIAGNOSIS — I69391 Dysphagia following cerebral infarction: Secondary | ICD-10-CM | POA: Diagnosis not present

## 2024-04-02 DIAGNOSIS — E119 Type 2 diabetes mellitus without complications: Secondary | ICD-10-CM | POA: Diagnosis not present

## 2024-04-02 DIAGNOSIS — I110 Hypertensive heart disease with heart failure: Secondary | ICD-10-CM

## 2024-04-02 DIAGNOSIS — K5900 Constipation, unspecified: Secondary | ICD-10-CM | POA: Diagnosis not present

## 2024-04-02 DIAGNOSIS — G936 Cerebral edema: Secondary | ICD-10-CM | POA: Diagnosis present

## 2024-04-02 DIAGNOSIS — I152 Hypertension secondary to endocrine disorders: Secondary | ICD-10-CM | POA: Diagnosis present

## 2024-04-02 DIAGNOSIS — J449 Chronic obstructive pulmonary disease, unspecified: Secondary | ICD-10-CM | POA: Diagnosis present

## 2024-04-02 DIAGNOSIS — E1169 Type 2 diabetes mellitus with other specified complication: Secondary | ICD-10-CM | POA: Diagnosis not present

## 2024-04-02 DIAGNOSIS — I509 Heart failure, unspecified: Secondary | ICD-10-CM

## 2024-04-02 DIAGNOSIS — Q283 Other malformations of cerebral vessels: Secondary | ICD-10-CM | POA: Diagnosis not present

## 2024-04-02 DIAGNOSIS — K5901 Slow transit constipation: Secondary | ICD-10-CM | POA: Diagnosis not present

## 2024-04-02 DIAGNOSIS — Q049 Congenital malformation of brain, unspecified: Secondary | ICD-10-CM

## 2024-04-02 DIAGNOSIS — Q282 Arteriovenous malformation of cerebral vessels: Secondary | ICD-10-CM | POA: Diagnosis not present

## 2024-04-02 DIAGNOSIS — E66812 Obesity, class 2: Secondary | ICD-10-CM | POA: Diagnosis present

## 2024-04-02 DIAGNOSIS — Z7985 Long-term (current) use of injectable non-insulin antidiabetic drugs: Secondary | ICD-10-CM | POA: Diagnosis not present

## 2024-04-02 DIAGNOSIS — F54 Psychological and behavioral factors associated with disorders or diseases classified elsewhere: Secondary | ICD-10-CM | POA: Diagnosis not present

## 2024-04-02 DIAGNOSIS — D1800 Hemangioma unspecified site: Secondary | ICD-10-CM | POA: Diagnosis not present

## 2024-04-02 DIAGNOSIS — Z6835 Body mass index (BMI) 35.0-35.9, adult: Secondary | ICD-10-CM | POA: Diagnosis not present

## 2024-04-02 DIAGNOSIS — Z794 Long term (current) use of insulin: Secondary | ICD-10-CM | POA: Diagnosis not present

## 2024-04-02 DIAGNOSIS — I613 Nontraumatic intracerebral hemorrhage in brain stem: Secondary | ICD-10-CM | POA: Diagnosis present

## 2024-04-02 DIAGNOSIS — E785 Hyperlipidemia, unspecified: Secondary | ICD-10-CM

## 2024-04-02 DIAGNOSIS — Z833 Family history of diabetes mellitus: Secondary | ICD-10-CM | POA: Diagnosis not present

## 2024-04-02 DIAGNOSIS — E1165 Type 2 diabetes mellitus with hyperglycemia: Secondary | ICD-10-CM | POA: Diagnosis present

## 2024-04-02 DIAGNOSIS — Z888 Allergy status to other drugs, medicaments and biological substances status: Secondary | ICD-10-CM | POA: Diagnosis not present

## 2024-04-02 DIAGNOSIS — H4922 Sixth [abducent] nerve palsy, left eye: Secondary | ICD-10-CM | POA: Diagnosis present

## 2024-04-02 DIAGNOSIS — E876 Hypokalemia: Secondary | ICD-10-CM

## 2024-04-02 DIAGNOSIS — R443 Hallucinations, unspecified: Secondary | ICD-10-CM | POA: Diagnosis not present

## 2024-04-02 DIAGNOSIS — E1159 Type 2 diabetes mellitus with other circulatory complications: Secondary | ICD-10-CM | POA: Diagnosis not present

## 2024-04-02 DIAGNOSIS — Z91040 Latex allergy status: Secondary | ICD-10-CM | POA: Diagnosis not present

## 2024-04-02 DIAGNOSIS — Z7984 Long term (current) use of oral hypoglycemic drugs: Secondary | ICD-10-CM | POA: Diagnosis not present

## 2024-04-02 DIAGNOSIS — Z79899 Other long term (current) drug therapy: Secondary | ICD-10-CM | POA: Diagnosis not present

## 2024-04-02 DIAGNOSIS — I629 Nontraumatic intracranial hemorrhage, unspecified: Secondary | ICD-10-CM | POA: Diagnosis not present

## 2024-04-02 DIAGNOSIS — M79604 Pain in right leg: Secondary | ICD-10-CM | POA: Diagnosis not present

## 2024-04-02 DIAGNOSIS — R1310 Dysphagia, unspecified: Secondary | ICD-10-CM | POA: Diagnosis present

## 2024-04-02 DIAGNOSIS — I10 Essential (primary) hypertension: Secondary | ICD-10-CM | POA: Diagnosis not present

## 2024-04-02 DIAGNOSIS — K219 Gastro-esophageal reflux disease without esophagitis: Secondary | ICD-10-CM | POA: Diagnosis present

## 2024-04-02 LAB — GLUCOSE, CAPILLARY
Glucose-Capillary: 107 mg/dL — ABNORMAL HIGH (ref 70–99)
Glucose-Capillary: 113 mg/dL — ABNORMAL HIGH (ref 70–99)
Glucose-Capillary: 106 mg/dL — ABNORMAL HIGH (ref 70–99)
Glucose-Capillary: 116 mg/dL — ABNORMAL HIGH (ref 70–99)
Glucose-Capillary: 201 mg/dL — ABNORMAL HIGH (ref 70–99)
Glucose-Capillary: 118 mg/dL — ABNORMAL HIGH (ref 70–99)
Glucose-Capillary: 106 mg/dL — ABNORMAL HIGH (ref 70–99)

## 2024-04-02 LAB — HIV ANTIBODY (ROUTINE TESTING W REFLEX): HIV Screen 4th Generation wRfx: NONREACTIVE

## 2024-04-02 LAB — CBG MONITORING, ED: Glucose-Capillary: 126 mg/dL — ABNORMAL HIGH (ref 70–99)

## 2024-04-02 LAB — SODIUM
Sodium: 139 mmol/L (ref 135–145)
Sodium: 138 mmol/L (ref 135–145)

## 2024-04-02 MED ORDER — INSULIN ASPART 100 UNIT/ML IJ SOLN
0.0000 [IU] | INTRAMUSCULAR | Status: DC
  Administered 2024-04-02: 3 [IU] via SUBCUTANEOUS
  Administered 2024-04-03: 2 [IU] via SUBCUTANEOUS
  Filled 2024-04-02: qty 3
  Filled 2024-04-02: qty 2

## 2024-04-02 MED ORDER — ACETAMINOPHEN 650 MG RE SUPP: 650.0000 mg | Freq: Four times a day (QID) | RECTAL | Status: DC | PRN

## 2024-04-02 MED ORDER — HYDRALAZINE HCL 20 MG/ML IJ SOLN
5.0000 mg | INTRAMUSCULAR | Status: DC | PRN
  Filled 2024-04-02: qty 1

## 2024-04-02 MED ORDER — POTASSIUM CHLORIDE 10 MEQ/100ML IV SOLN
10.0000 meq | INTRAVENOUS | Status: AC
  Administered 2024-04-02 (×4): 10 meq via INTRAVENOUS
  Filled 2024-04-02 (×4): qty 100

## 2024-04-02 MED ORDER — ACETAMINOPHEN 325 MG PO TABS
650.0000 mg | ORAL_TABLET | Freq: Four times a day (QID) | ORAL | Status: DC | PRN
  Administered 2024-04-02 – 2024-04-12 (×8): 650 mg via ORAL
  Filled 2024-04-02 (×3): qty 2

## 2024-04-02 MED ORDER — GADOBUTROL 1 MMOL/ML IV SOLN
10.0000 mL | Freq: Once | INTRAVENOUS | Status: AC | PRN
  Administered 2024-04-02: 10 mL via INTRAVENOUS

## 2024-04-02 NOTE — Progress Notes (Signed)
 " PROGRESS NOTE    Larry Dawson  FMW:[id number] DOB: 1955/07/28 DOA: 04/01/2024 PCP: [name], MD   Brief Narrative: This 68 yrs old Male with PMH significant for COPD, hypertension, hyperlipidemia, type 2 diabetes presented in the ED with complaints of worsening right upper and lower extremity weakness / Numbness, ataxia, intermittent diplopia, slurring of speech, and difficulty swallowing.  Recent hospital admission 12/8-12/10 for  lightheadedness /off-balance and was found to have pontine intracranial hemorrhage versus hemorrhagic mass.  He is not on any anticoagulation or antiplatelet agents.  CT head showing interval increase in size of known pontine hemorrhage with increased surrounding hypodense edema with slightly expanded appearance of the pons.    Patient was admitted for further evaluation. Patient was evaluated by Neurology and Neurosurgery.   Assessment & Plan:   Principal Problem:   Intracranial hemorrhage (HCC) Active Problems:   Hypertension associated with type 2 diabetes mellitus (HCC)   COPD (chronic obstructive pulmonary disease) (HCC)   Hypokalemia   Pontine hemorrhage (HCC)   Cavernous malformation   Pontine hemorrhage: Patient was recently hospitalized from 12/8-12/10 for pontine hemorrhage.   Now presenting with complaints of worsening right-sided weakness /numbness, ataxia, intermittent diplopia, slurring of speech, and difficulty swallowing.  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  Both Neurology and Neurosurgery have been consulted.  Neurology initially ordered hypertonic saline which was later stopped and recommended sodium checks every 6 hours x 2  to ensure no hypernatremia.  Patient is not on anticoagulation or antiplatelet agents.  Keep SBP below 140.   Repeat CT head showed stable CT appearance of 2.5 cm  central pontine hemorrhage and /or cavernous venous malformation.  No new intracranial abnormality. Neurosurgery would not recommend urgent surgical intervention, however will need craniotomy for resection of this cavernoma.   Typically these lesions are not resected urgently but rather 2 to 4 weeks after the hemorrhage occurs. PT and OT evaluation.   Hypertension: Keep SBP below 140 per neurology.   Continue IV hydralazine  PRN.   Mild hypokalemia: Replaced. Continue to monitor.   COPD: Stable, no signs of acute exacerbation.   Does not appear to be on inhalers at home.   Type 2 diabetes: Hemoglobin A1c 7.2 on 01/06/2024.   Placed on sensitive sliding scale insulin  every 4 hours.  DVT prophylaxis: SCDs Code Status: Full code Family Communication: No family at bedside.  Disposition Plan:  Admitted for pontine hemorrhage.  Neurosurgery and neurology is following. Patient is not medically ready for discharge.  Consultants:  Neurology Neurosurgery  Procedures: CT head  Antimicrobials:  Anti-infectives (From admission, onward)    None      Subjective: Patient was seen and examined at bedside.  Overnight events noted. Patient was sitting at the bedside commode denies any headache, dizziness, weakness.   He reports feeling much improved.  Objective: Vitals:   04/02/24 0713 04/02/24 0748 04/02/24 0801 04/02/24 1130  BP: (!) 145/76 (!) 140/80 139/86 111/64  Pulse: 72 74 71 80  Resp: 14 16 15 18   Temp:  98.1 F (36.7 C)  98.5 F (36.9 C)  TempSrc:  Oral  Oral  SpO2: 96% 93%  96%  Weight:      Height:        Intake/Output Summary (Last 24 hours) at 04/02/2024 1157 Last data filed at 04/02/2024 0700 Gross per 24 hour  Intake 376.61 ml  Output 220  ml  Net 156.61 ml   Filed Weights   04/02/24 0213  Weight: 102.2 kg    Examination:  General exam: Appears calm and comfortable, not in any acute distress. Respiratory system: Clear to auscultation. Respiratory  effort normal.  [id number] Cardiovascular system: S1 & S2 heard, RRR. No JVD, murmurs, rubs, gallops or clicks.  Gastrointestinal system: Abdomen is non distended, soft and non tender. Normal bowel sounds heard. Central nervous system: Alert and oriented x 3. No focal neurological deficits. Extremities: No edema, no cyanosis, no clubbing. Skin: No rashes, lesions or ulcers Psychiatry: Judgement and insight appear normal. Mood & affect appropriate.   Data Reviewed: I have personally reviewed following labs and imaging studies  CBC: Recent Labs  Lab 04/01/24 1631  WBC 10.4  NEUTROABS 6.9  HGB 15.6  HCT 45.9  MCV 82.3  PLT 320   Basic Metabolic Panel: Recent Labs  Lab 04/01/24 1740 04/01/24 1927 04/02/24 0127 04/02/24 0656  NA 136 136 139 138  K 3.1*  --   --   --   CL 98  --   --   --   CO2 27  --   --   --   GLUCOSE 116*  --   --   --   BUN 15  --   --   --   CREATININE 1.20  --   --   --   CALCIUM  10.1  --   --   --    GFR: Estimated Creatinine Clearance: 68 mL/min (by C-G formula based on SCr of 1.2 mg/dL). Liver Function Tests: Recent Labs  Lab 04/01/24 1740  AST 20  ALT 20  ALKPHOS 58  BILITOT 1.3*  PROT 7.6  ALBUMIN 4.1   No results for input(s): LIPASE, AMYLASE in the last 168 hours. No results for input(s): AMMONIA in the last 168 hours. Coagulation Profile: No results for input(s): INR, PROTIME in the last 168 hours. Cardiac Enzymes: No results for input(s): CKTOTAL, CKMB, CKMBINDEX, TROPONINI in the last 168 hours. BNP (last 3 results) No results for input(s): PROBNP in the last 8760 hours. HbA1C: No results for input(s): HGBA1C in the last 72 hours. CBG: Recent Labs  Lab 04/02/24 0135 04/02/24 0239 04/02/24 0417 04/02/24 0751 04/02/24 1134  GLUCAP 126* 107* 113* 106* 116*   Lipid Profile: No results for input(s): CHOL, HDL, LDLCALC, TRIG, CHOLHDL, LDLDIRECT in the last 72 hours. Thyroid  Function Tests: No  results for input(s): TSH, T4TOTAL, FREET4, T3FREE, THYROIDAB in the last 72 hours. Anemia Panel: No results for input(s): VITAMINB12, FOLATE, FERRITIN, TIBC, IRON, RETICCTPCT in the last 72 hours. Sepsis Labs: No results for input(s): PROCALCITON, LATICACIDVEN in the last 168 hours.  Recent Results (from the past 240 hours)  Resp panel by RT-PCR (RSV, Flu A&B, Covid) Anterior Nasal Swab     Status: None   Collection Time: 04/01/24  3:45 PM   Specimen: Anterior Nasal Swab  Result Value Ref Range Status   SARS Coronavirus 2 by RT PCR NEGATIVE NEGATIVE Final   Influenza A by PCR NEGATIVE NEGATIVE Final   Influenza B by PCR NEGATIVE NEGATIVE Final    Comment: (NOTE) The Xpert Xpress SARS-CoV-2/FLU/RSV plus assay is intended as an aid in the diagnosis of influenza from Nasopharyngeal swab specimens and should not be used as a sole basis for treatment. Nasal washings and aspirates are unacceptable for Xpert Xpress SARS-CoV-2/FLU/RSV testing.  Fact Sheet for Patients: bloggercourse.com  Fact Sheet for Healthcare Providers: seriousbroker.it  This  test is not yet approved or cleared by the [place]  FDA and has been authorized for detection and/or diagnosis of SARS-CoV-2 by FDA under an Emergency Use Authorization (EUA). This EUA will remain in effect (meaning this test can be used) for the duration of the COVID-19 declaration under Section 564(b)(1) of the Act, 21 U.S.C. section 360bbb-3(b)(1), unless the authorization is terminated or revoked.     Resp Syncytial Virus by PCR NEGATIVE NEGATIVE Final    Comment: (NOTE) Fact Sheet for Patients: bloggercourse.com  Fact Sheet for Healthcare Providers: seriousbroker.it  This test is not yet approved or cleared by the [place]  FDA and has been authorized for detection and/or diagnosis of SARS-CoV-2  by FDA under an Emergency Use Authorization (EUA). This EUA will remain in effect (meaning this test can be used) for the duration of the COVID-19 declaration under Section 564(b)(1) of the Act, 21 U.S.C. section 360bbb-3(b)(1), unless the authorization is terminated or revoked.  Performed at [facility], [address], [place], [place] [address]     Radiology Studies: CT HEAD WO CONTRAST ( ) Result Date: 04/02/2024 EXAM: CT HEAD WITHOUT CONTRAST 04/02/2024 04:29:38 AM TECHNIQUE: CT of the head was performed without the administration of intravenous contrast. Automated exposure control, iterative reconstruction, and/or weight based adjustment of the mA/kV was utilized to reduce the radiation dose to as low as reasonably achievable. COMPARISON: Head CT and brain MRI yesterday and earlier. CLINICAL HISTORY: 68 year old male. 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 FINDINGS: BRAIN AND VENTRICLES: Rounded, hyperdense and stippled intra-axial mass lesion of the pons is stable, roughly 25 mm diameter (series 2 image 8). Mild pontine expansion again noted but little if any edema surrounding the hyperdense lesion. No extra-axial or intraventricular extension. Stable gray-white differentiation otherwise. No hydrocephalus. No extra-axial collection. Calcified atherosclerosis at the skull base. ORBITS: No acute abnormality. SINUSES: Stable paranasal sinus, middle ear and mastoid aeration. Right sphenoid mucoperiosteal thickening again noted. SOFT TISSUES AND SKULL: No acute soft tissue abnormality. No skull fracture. IMPRESSION: 1. 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 2. No new intracranial abnormality. Electronically signed by: Helayne Hurst MD 04/02/2024 04:50 AM EST RP Workstation: [id number]   MR BRAIN W WO CONTRAST Result Date: 04/02/2024 EXAM: MRI BRAIN WITH AND WITHOUT CONTRAST 04/02/2024 12:42:02 AM TECHNIQUE:  Multiplanar multisequence MRI of the head/brain was performed with and without the administration of intravenous contrast. COMPARISON: None available. 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 Please get CISS sequences as well as T1 stealth sequences at the request of neurosurgery. FINDINGS: BRAIN AND VENTRICLES: Interval enlargement of the pontine hemorrhage which now measures 2.7 x 2.3 x 1.9 cm and demonstrates popcorn like heterogeneous T2 and T1 signal without evidence of enhancement. Mild surrounding edema. No hydrocephalus. The sella is unremarkable. Normal flow voids. No abnormal enhancement. Moderate T2 hyperintensities in the white matter, compatible with chronic microvascular ischemic change. ORBITS: No acute abnormality. SINUSES: No acute abnormality. BONES AND SOFT TISSUES: Normal bone marrow signal and enhancement. No acute soft tissue abnormality. Findings discussed with Dr. Khaliqdina via telephone at 12:54 AM. IMPRESSION: 1. 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 A solitary metastasis is a less likely consideration. Electronically signed by: Gilmore Molt 04/02/2024 01:06 AM EST RP Workstation: [id number]   CT Head Wo Contrast Result Date: 04/01/2024 CLINICAL DATA:  History of brainstem bleed, increased or worsened mobility  issues and weakness worsened speech EXAM: CT HEAD WITHOUT CONTRAST TECHNIQUE: Contiguous axial images were obtained from the base of the skull through the vertex without intravenous contrast. RADIATION DOSE REDUCTION: This exam was performed according to the departmental dose-optimization program which includes automated exposure control, adjustment of the mA and/or kV according to patient size and/or use of iterative reconstruction technique. COMPARISON:  Head CT 03/18/2024, MRI 03/16/2024, head CT 03/16/2024 FINDINGS: Brain:  Focal acute hemorrhage within the pons, this now measures 2.5 x 2.3 cm on series 2, image 10, previously 1.8 x 1.6 cm. Increased surrounding hypodense edema with slightly expanded appearance of the pons. Fourth ventricle remains patent. Slight increased effacement of prepontine cistern/CSF spaces. Otherwise no interval new hemorrhage. Atrophy and chronic small vessel ischemic changes of the white matter. Chronic lacunar infarct in the basal ganglia and thalamus. No midline shift. Vascular: Mild carotid vascular calcification. Skull: Normal. Negative for fracture or focal lesion. Sinuses/Orbits: No acute finding. Other: None IMPRESSION: 1. Interval increase in size of known pontine hemorrhage, now measuring 2.5 x 2.3 cm, previously 1.8 x 1.6 cm. Increased surrounding hypodense edema with slightly expanded appearance of the pons. Slight increased effacement of prepontine cistern/CSF spaces. Fourth ventricle remains patent. 2. Atrophy and chronic small vessel ischemic changes of the white matter. Chronic lacunar infarcts in the basal ganglia and thalamus. Electronically Signed   By: Luke Bun M.D.   On: 04/01/2024 16:17   DG Chest Portable 1 View Result Date: 04/01/2024 CLINICAL DATA:  Cough, confusion EXAM: PORTABLE CHEST 1 VIEW COMPARISON:  September 09, 2007 FINDINGS: The cardiomediastinal silhouette is unchanged in contour.Tortuous thoracic aorta. No pleural effusion. No pneumothorax. No acute pleuroparenchymal abnormality. IMPRESSION: No acute cardiopulmonary abnormality. Electronically Signed   By: Corean Salter M.D.   On: 04/01/2024 15:49   Scheduled Meds:  insulin  aspart  0-9 Units Subcutaneous Q4H   Continuous Infusions:   LOS: 0 days    Time spent: 50 mins    Darcel Dawley, MD [facility]   If 7PM-7AM, please contact night-coverage  "

## 2024-04-02 NOTE — Consult Note (Signed)
 "   Referring Physician:  No referring provider defined for this encounter.  Primary Physician:  [name], MD  History of Present Illness: 04/02/2024 [name] is here today with a chief complaint of worsening gait. 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  He was admitted approximately 2 weeks ago for this.  He has seen my partner Dr. Janjua for this.  He presented back to the emergency department today with worsening right upper and lower extremity weakness, double vision, slurring of his speech, and difficulty swallowing.  He also has worsening balance.  He denied headache.  His symptoms have been worsening over the past 2 to 3 days.  He was reevaluated from an imaging standpoint and found to have an enlargement of his pontine hemorrhage.  The symptoms are causing a significant impact on the patient's life.   I have utilized the care everywhere function in epic to review the outside records available from external health systems.   Review of Systems:  A 10 point review of systems is negative, except for the pertinent positives and negatives detailed in the HPI.  Past Medical History: Past Medical History:  Diagnosis Date   Allergy    Anal fissure    COPD (chronic obstructive pulmonary disease) (HCC)    Albuterol  PRN.   Hydrocele    Hyperlipidemia    Hypertension    IBS (irritable bowel syndrome) 05/22/13   per chart note-pt denied    Past Surgical History: Past Surgical History:  Procedure Laterality Date   HERNIA REPAIR  prior to 2013   HYDROCELE EXCISION Bilateral 11/23/2013   Procedure: HYDROCELECTOMY ADULT;  Surgeon: Alm GORMAN Fragmin, MD;  Location: [facility];  Service: Urology;  Laterality: Bilateral;   VASECTOMY  prior to 2013    Allergies: Allergies as of 04/01/2024 - Review Complete 04/01/2024  Allergen Reaction Noted   Ace inhibitors Swelling 04/28/2015   Penicillins Anaphylaxis, Hives, Rash,  and Other (See Comments) 07/18/2011   Latex Hives, Swelling, and Rash 07/18/2011    Medications: Current Medications[1]  Social History: Social History[2]  Family Medical History: Family History  Problem Relation Age of Onset   Diabetes Father    Hypertension Father    Diabetes Brother    Hypertension Brother    Diabetes Brother    Diabetes Brother     Physical Examination: Vitals:   04/02/24 0213 04/02/24 0402  BP: 130/77 (!) 155/80  Pulse: 74 69  Resp: (!) 22 15  Temp: 97.6 F (36.4 C) 97.8 F (36.6 C)  SpO2: 96% 97%    General: Patient is in no apparent distress. Attention to examination is appropriate.  Neck:   Supple.  Full range of motion.  Respiratory: Patient is breathing without any difficulty.   NEUROLOGICAL:     Awake, alert, oriented to person, place, and time.  Speech  shows very mild dysarthria.  Cranial Nerves: Pupils equal round and reactive to light.  Facial tone shows possible mild right facial.  Facial sensation is symmetric. Shoulder shrug is symmetric. Tongue protrusion is midline.  There is no pronator drift.  Strength:  Moves all extremities well with apparent full strength 5 out of 5.  He has diminished light touch sensation in his right lower extremity worse than his right upper extremity.  Bilateral upper and lower extremity sensation is intact to light touch.    No evidence of dysmetria noted.  Gait is untested.   Medical Decision Making  Imaging:  CT Head 04/02/2024  IMPRESSION: 1. 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 2. No new intracranial abnormality.   Electronically signed by: Helayne Hurst MD 04/02/2024 04:50 AM EST RP Workstation: [id number]  MRI Brain wwo 04/02/2024 IMPRESSION: 1. 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 A solitary metastasis is a less likely consideration.   Electronically signed by: Gilmore Molt 04/02/2024 01:06 AM EST RP Workstation: [id number]  I have personally reviewed the images and agree with the above interpretation.  Assessment and Plan: [name] is a pleasant 68 y.o. 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  He is having significant symptoms from this due to dysfunction in this pons.  I would not recommend urgent surgical intervention.  He will however need consideration of craniotomy for resection of this cavernoma.  I will discuss his case with my partner, who will come and talk to the patient tomorrow to make arrangements.  Typically, these lesions are not resected urgently but rather 2 to 4 weeks after a hemorrhage occurs.  I have recommended formal speech therapy evaluation for swallowing difficulty.  He is cleared to work with physical and Occupational Therapy.      Thank you for involving me in the care of this patient.      Jordana Dugue K. Clois MD, MPHS Neurosurgery     [1]  Current Facility-Administered Medications:    acetaminophen  (TYLENOL ) tablet 650 mg, 650 mg, Oral, Q6H PRN **OR** acetaminophen  (TYLENOL ) suppository 650 mg, 650 mg, Rectal, Q6H PRN, Alfornia Madison, MD   hydrALAZINE  (APRESOLINE ) injection 5 mg, 5 mg, Intravenous, Q4H PRN, Rathore, Vasundhra, MD   insulin  aspart (novoLOG ) injection 0-9 Units, 0-9 Units, Subcutaneous, Q4H, Alfornia Madison, MD [2]  Social History Tobacco Use   Smoking status: Former    Types: Cigarettes   Smokeless tobacco: Never  Vaping Use   Vaping status: Never Used  Substance Use Topics   Alcohol use: Not Currently    Comment: rarely   Drug use: No   "

## 2024-04-02 NOTE — Plan of Care (Signed)
" °  Problem: Education: Goal: Ability to describe self-care measures that may prevent or decrease complications (Diabetes Survival Skills Education) will improve Outcome: Progressing Goal: Individualized Educational Video(s) Outcome: Progressing   Problem: Coping: Goal: Ability to adjust to condition or change in health will improve Outcome: Progressing   Problem: Fluid Volume: Goal: Ability to maintain a balanced intake and output will improve Outcome: Progressing   Problem: Health Behavior/Discharge Planning: Goal: Ability to identify and utilize available resources and services will improve Outcome: Progressing Goal: Ability to manage health-related needs will improve Outcome: Progressing   Problem: Metabolic: Goal: Ability to maintain appropriate glucose levels will improve Outcome: Progressing   Problem: Nutritional: Goal: Maintenance of adequate nutrition will improve Outcome: Progressing Goal: Progress toward achieving an optimal weight will improve Outcome: Progressing   Problem: Skin Integrity: Goal: Risk for impaired skin integrity will decrease Outcome: Progressing   Problem: Tissue Perfusion: Goal: Adequacy of tissue perfusion will improve Outcome: Progressing   Problem: Education: Goal: Knowledge of secondary prevention will improve (MUST DOCUMENT ALL) Outcome: Progressing   Problem: Spontaneous Subarachnoid Hemorrhage Tissue Perfusion: Goal: Complications of Spontaneous Subarachnoid Hemorrhage will be minimized Outcome: Progressing   Problem: Health Behavior/Discharge Planning: Goal: Ability to manage health-related needs will improve Outcome: Progressing Goal: Goals will be collaboratively established with patient/family Outcome: Progressing   Problem: Self-Care: Goal: Ability to participate in self-care as condition permits will improve Outcome: Progressing Goal: Verbalization of feelings and concerns over difficulty with self-care will  improve Outcome: Progressing Goal: Ability to communicate needs accurately will improve Outcome: Progressing   "

## 2024-04-02 NOTE — Progress Notes (Signed)
 Pt daughter voiced concern of pt eyes looking different. This RN assessed pt and pt had difficulty moving both eyes to the left. Pt also c/o headache. MD Clois notified. See new orders.

## 2024-04-02 NOTE — Progress Notes (Addendum)
 STROKE TEAM PROGRESS NOTE    SIGNIFICANT HOSPITAL EVENTS Presented with worsening of right side weakness numbness ataxia, slurred speech, difficulty swallowing and intermittent diplopia.  He was admitted on 03/16/2024 and found to have a hemorrhagic pontine mass with surrounding edema.  Repeat CT head in the emergency room with increased size of the pontine hemorrhage and he was admitted to the floor  INTERIM HISTORY/SUBJECTIVE Patient sitting in the chair in no apparent distress.  He states that his right sided symptoms became worse with intermittent double vision, also had some slurred speech and trouble swallowing Neurosurgery has been consulted and will follow-up with Dr. [name]  CBC    Component Value Date/Time   WBC 10.4 04/01/2024 1631   RBC 5.58 04/01/2024 1631   HGB 15.6 04/01/2024 1631   HGB 15.0 02/19/2019 1033   HCT 45.9 04/01/2024 1631   HCT 44.6 02/19/2019 1033   PLT 320 04/01/2024 1631   PLT 261 02/19/2019 1033   MCV 82.3 04/01/2024 1631   MCV 84 02/19/2019 1033   MCH 28.0 04/01/2024 1631   MCHC 34.0 04/01/2024 1631   RDW 12.6 04/01/2024 1631   RDW 12.9 02/19/2019 1033   LYMPHSABS 2.4 04/01/2024 1631   LYMPHSABS 2.3 02/19/2019 1033   MONOABS 0.9 04/01/2024 1631   EOSABS 0.1 04/01/2024 1631   EOSABS 0.1 02/19/2019 1033   BASOSABS 0.1 04/01/2024 1631   BASOSABS 0.1 02/19/2019 1033    BMET    Component Value Date/Time   NA 138 04/02/2024 0656   NA 139 05/25/2020 1114   K 3.1 (L) 04/01/2024 1740   CL 98 04/01/2024 1740   CO2 27 04/01/2024 1740   GLUCOSE 116 (H) 04/01/2024 1740   BUN 15 04/01/2024 1740   BUN 20 05/25/2020 1114   CREATININE 1.20 04/01/2024 1740   CREATININE 1.27 11/19/2015 1212   CALCIUM  10.1 04/01/2024 1740   GFRNONAA >60 04/01/2024 1740   GFRNONAA 61 11/19/2015 1212    IMAGING past 24 hours CT HEAD WO CONTRAST ( ) Result Date: 04/02/2024 EXAM: CT HEAD WITHOUT CONTRAST 04/02/2024 04:29:38 AM TECHNIQUE: CT of the head was performed  without the administration of intravenous contrast. Automated exposure control, iterative reconstruction, and/or weight based adjustment of the mA/kV was utilized to reduce the radiation dose to as low as reasonably achievable. COMPARISON: Head CT and brain MRI yesterday and earlier. CLINICAL HISTORY: 68 year old male. 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 FINDINGS: BRAIN AND VENTRICLES: Rounded, hyperdense and stippled intra-axial mass lesion of the pons is stable, roughly 25 mm diameter (series 2 image 8). Mild pontine expansion again noted but little if any edema surrounding the hyperdense lesion. No extra-axial or intraventricular extension. Stable gray-white differentiation otherwise. No hydrocephalus. No extra-axial collection. Calcified atherosclerosis at the skull base. ORBITS: No acute abnormality. SINUSES: Stable paranasal sinus, middle ear and mastoid aeration. Right sphenoid mucoperiosteal thickening again noted. SOFT TISSUES AND SKULL: No acute soft tissue abnormality. No skull fracture. IMPRESSION: 1. 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 2. No new intracranial abnormality. Electronically signed by: Helayne Hurst MD 04/02/2024 04:50 AM EST RP Workstation: [id number]   MR BRAIN W WO CONTRAST Result Date: 04/02/2024 EXAM: MRI BRAIN WITH AND WITHOUT CONTRAST 04/02/2024 12:42:02 AM TECHNIQUE: Multiplanar multisequence MRI of the head/brain was performed with and without the administration of intravenous contrast. COMPARISON: None available. 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 Please get CISS sequences as well as T1 stealth sequences at the request of neurosurgery. FINDINGS: BRAIN  AND VENTRICLES: Interval enlargement of the pontine hemorrhage which now measures 2.7 x 2.3 x 1.9 cm and demonstrates popcorn like heterogeneous T2 and T1 signal without evidence of enhancement. Mild surrounding edema. No  hydrocephalus. The sella is unremarkable. Normal flow voids. No abnormal enhancement. Moderate T2 hyperintensities in the white matter, compatible with chronic microvascular ischemic change. ORBITS: No acute abnormality. SINUSES: No acute abnormality. BONES AND SOFT TISSUES: Normal bone marrow signal and enhancement. No acute soft tissue abnormality. Findings discussed with Dr. Khaliqdina via telephone at 12:54 AM. IMPRESSION: 1. 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 A solitary metastasis is a less likely consideration. Electronically signed by: Gilmore Molt 04/02/2024 01:06 AM EST RP Workstation: [id number]   CT Head Wo Contrast Result Date: 04/01/2024 CLINICAL DATA:  History of brainstem bleed, increased or worsened mobility issues and weakness worsened speech EXAM: CT HEAD WITHOUT CONTRAST TECHNIQUE: Contiguous axial images were obtained from the base of the skull through the vertex without intravenous contrast. RADIATION DOSE REDUCTION: This exam was performed according to the departmental dose-optimization program which includes automated exposure control, adjustment of the mA and/or kV according to patient size and/or use of iterative reconstruction technique. COMPARISON:  Head CT 03/18/2024, MRI 03/16/2024, head CT 03/16/2024 FINDINGS: Brain: Focal acute hemorrhage within the pons, this now measures 2.5 x 2.3 cm on series 2, image 10, previously 1.8 x 1.6 cm. Increased surrounding hypodense edema with slightly expanded appearance of the pons. Fourth ventricle remains patent. Slight increased effacement of prepontine cistern/CSF spaces. Otherwise no interval new hemorrhage. Atrophy and chronic small vessel ischemic changes of the white matter. Chronic lacunar infarct in the basal ganglia and thalamus. No midline shift. Vascular: Mild carotid vascular calcification.  Skull: Normal. Negative for fracture or focal lesion. Sinuses/Orbits: No acute finding. Other: None IMPRESSION: 1. Interval increase in size of known pontine hemorrhage, now measuring 2.5 x 2.3 cm, previously 1.8 x 1.6 cm. Increased surrounding hypodense edema with slightly expanded appearance of the pons. Slight increased effacement of prepontine cistern/CSF spaces. Fourth ventricle remains patent. 2. Atrophy and chronic small vessel ischemic changes of the white matter. Chronic lacunar infarcts in the basal ganglia and thalamus. Electronically Signed   By: Luke Bun M.D.   On: 04/01/2024 16:17   DG Chest Portable 1 View Result Date: 04/01/2024 CLINICAL DATA:  Cough, confusion EXAM: PORTABLE CHEST 1 VIEW COMPARISON:  September 09, 2007 FINDINGS: The cardiomediastinal silhouette is unchanged in contour.Tortuous thoracic aorta. No pleural effusion. No pneumothorax. No acute pleuroparenchymal abnormality. IMPRESSION: No acute cardiopulmonary abnormality. Electronically Signed   By: Corean Salter M.D.   On: 04/01/2024 15:49    Vitals:   04/02/24 0713 04/02/24 0748 04/02/24 0801 04/02/24 1130  BP: (!) 145/76 (!) 140/80 139/86 111/64  Pulse: 72 74 71 80  Resp: 14 16 15 18   Temp:  98.1 F (36.7 C)  98.5 F (36.9 C)  TempSrc:  Oral  Oral  SpO2: 96% 93%  96%  Weight:      Height:         PHYSICAL EXAM General:  Alert, well-nourished, well-developed patient in no acute distress Psych:  Mood and affect appropriate for situation CV: Regular rate and rhythm on monitor Respiratory:  Regular, unlabored respirations on room air GI: Abdomen soft and nontender   NEURO:  Mental Status: AA&Ox3, patient is able to give clear and coherent history Speech/Language: speech is without aphasia,  has dysarthria.  Naming, repetition, fluency, and comprehension intact.  Cranial Nerves:  II: PERRL. Visual fields full.  III, IV, VI: EOMI. Eyelids elevate symmetrically.  Left 6th nerve palsy V: Sensation is  intact to light touch and symmetrical to face.  VII: Right facial droop VIII: hearing intact to voice. IX, X: Palate elevates symmetrically. Phonation is normal.  KP:Dynloizm shrug 5/5. XII: tongue is midline without fasciculations. Motor: 5/5 strength left arm and leg, right arm 4/5 with drift, right leg 5/5 Tone: is normal and bulk is normal Sensation- Intact to light touch bilaterally. Extinction absent to light touch to DSS.   Coordination: Ataxia in both arm and leg on right side Gait- deferred  ASSESSMENT/PLAN  [name] is a 68 y.o. male with history of  COPD, HTN, HLD, IBS, hydrocele who initially presented to [facility] on 03/16/24 with feeling off balance for several days, head numbness for 4-5 days. 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 He was admitted to [facility]. He had MRI Brain with and without contrast which showed stable IPH. Repeat CT Head 2 days later along with CTA head and neck shows stable IPH with no vascular malformation or aneurysm.   Wife and daughter at the bedside reports a gradual decline over the last couple of weeks. Now unable to stand and very unsteady on his feet, R side giving him trouble, difficulties with swallowing, coughing with food intake, and slurring his speech and intermittent diplopia.   Repeat CT Head today with increase in the size of pontine hemorrhage.   Intracerebral Hemorrhage:  right pontine with edema  Etiology: Possible cavernoma CT head  1. Interval increase in size of known pontine hemorrhage, now measuring 2.5 x 2.3 cm, previously 1.8 x 1.6 cm. Increased surrounding hypodense edema with slightly expanded appearance of the pons. Slight increased effacement of prepontine cistern/CSF spaces.Fourth ventricle remains patent. 2. Atrophy and chronic small vessel ischemic changes of the white matter. Chronic lacunar infarcts in the basal ganglia and thalamus. MRI  1. Interval  enlargement of the pontine hemorrhage, which now demonstrates an appearance most consistent with a large hemorrhagic pontine cavernous malformation (as detailed above). A solitary metastasis is a less likely consideration. Repeat CT head stable Carotid Doppler 12/25 negative 2D Echo 12/25 EF 50 to 55% with mild concentric LVH.  Left atrium mildly dilated Neurosurgery following. Will need DSA  LDL 56 HgbA1c 7.2 VTE prophylaxis -SCDs No antithrombotic prior to admission, now on No antithrombotic  Therapy recommendations:  Pending Disposition: Pending  Hx of Stroke/TIA Chronic lacunar infarcts in the basal ganglia and thalamus.  Hypertension Home meds: Atenolol -chlorthalidone  50/25 mg, Stable Blood pressure goal less than 140  Hyperlipidemia Home meds: Crestor  20 mg,  resumed in hospital LDL 56, goal < 7o Continue statin at discharge  Diabetes type II UnControlled Home meds: Mounjaro , metformin , glipizide  HgbA1c 7.2, goal < 7.0 CBGs SSI Recommend close follow-up with PCP for better DM control   Dysphagia Patient has post-stroke dysphagia, SLP consulted    Diet   Diet NPO time specified   Advance diet as tolerated  Other Stroke Risk Factors Obesity, Body mass index is 35.29 kg/m., BMI >/= 30 associated with increased stroke risk, recommend weight loss, diet and exercise as appropriate  Coronary artery disease Congestive heart failure  Hospital day # 0    Karna Geralds DNP, ACNPC-AG  [facility]   To contact Stroke Continuity provider, please refer to [email or web address]. After  hours, contact General Neurology

## 2024-04-02 NOTE — Progress Notes (Signed)
" °   04/02/24 0402  Vitals  Temp 97.8 F (36.6 C)  Temp Source Oral  BP (!) 155/80  MAP (mmHg) 103  BP Location Left Arm  BP Method Automatic  Patient Position (if appropriate) Lying  Pulse Rate 69  Pulse Rate Source Monitor  ECG Heart Rate 69  Resp 15  Level of Consciousness  Level of Consciousness Alert  MEWS COLOR  MEWS Score Color Green  Oxygen Therapy  SpO2 97 %  O2 Device Room Air  Pain Assessment  Pain Scale 0-10  Pain Score 0  MEWS Score  MEWS Temp 0  MEWS Systolic 0  MEWS Pulse 0  MEWS RR 0  MEWS LOC 0  MEWS Score 0   Patient is an admission via ED. He is alert and oriented x4; mild weakness to RUE and RLE. Potassium 3.1, repleted. "

## 2024-04-02 NOTE — Evaluation (Signed)
 Clinical/Bedside Swallow Evaluation Patient Details  Name: Larry Dawson MRN: [id number] Date of Birth: [birth year]  Today's Date: 04/02/2024 Time: SLP Start Time (ACUTE ONLY): 1314 SLP Stop Time (ACUTE ONLY): 1328 SLP Time Calculation (min) (ACUTE ONLY): 14 min  Past Medical History:  Past Medical History:  Diagnosis Date   Allergy    Anal fissure    COPD (chronic obstructive pulmonary disease) (HCC)    Albuterol  PRN.   Hydrocele    Hyperlipidemia    Hypertension    IBS (irritable bowel syndrome) 05/22/13   per chart note-pt denied   Past Surgical History:  Past Surgical History:  Procedure Laterality Date   HERNIA REPAIR  prior to 2013   HYDROCELE EXCISION Bilateral 11/23/2013   Procedure: HYDROCELECTOMY ADULT;  Surgeon: Alm GORMAN Fragmin, MD;  Location: [facility];  Service: Urology;  Laterality: Bilateral;   VASECTOMY  prior to 2013   HPI:  [name] admitted with  worsening gait. 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     He was admitted approximately 2 weeks ago for this, presented back to the emergency department with worsening right upper and lower extremity weakness, double vision, slurring of his speech, and difficulty swallowing.  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 Pt will need instrumental assessment of swallowing while admitted, but does not need to remain NPO in the meantime. Can initiate mechanical soft diet with thin liquids and give pills whole in puree.   Pt has a strong cough response and has continued to tolerate meals without negative consequence. His main complaint has been occasional reflux/regurgitation.  Informed pt and wife of signs of aspiration and warned him to stop eating or drinking with any worsening. Will f/u with MBS when possible.    SLP Visit Diagnosis: Dysphagia, oropharyngeal phase (R13.12)    Aspiration Risk  Mild aspiration risk    Diet Recommendation           Other Recommendations Oral Care Recommendations: Oral care BID     Swallow Evaluation Recommendations Recommendations: PO diet PO Diet Recommendation: Dysphagia 3 (Mechanical soft);Thin liquids (Level 0) Liquid Administration via: Cup Medication Administration: Whole meds with puree Supervision: Patient able to self-feed Swallowing strategies  : Slow rate;Small bites/sips Oral care recommendations: Oral care BID (2x/day)   Assistance Recommended at Discharge    Functional Status Assessment    Frequency and Duration            Prognosis Prognosis for improved oropharyngeal function: Good      Swallow Study   General HPI: [name] admitted with  worsening gait. 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     He was admitted approximately 2 weeks ago for this, presented back to the emergency department with worsening right upper and lower extremity weakness, double vision, slurring of his speech, and difficulty swallowing.  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 Type of Study: Bedside Swallow Evaluation Previous Swallow Assessment: none Diet Prior to this Study: NPO Temperature Spikes Noted: No Respiratory Status: Room air History of Recent Intubation: No Behavior/Cognition: Alert;Cooperative;Pleasant mood  Oral Cavity Assessment: Within Functional Limits Oral Care Completed by SLP: No Oral Cavity - Dentition: Adequate natural dentition Vision: Functional for self-feeding Self-Feeding Abilities: Able to feed self Patient Positioning: Upright in  chair Baseline Vocal Quality: Normal Volitional Cough: Strong Volitional Swallow: Unable to elicit    Oral/Motor/Sensory Function Overall Oral Motor/Sensory Function: Other (comment) (All WNL except mild right nasolabial fold)   Ice Chips Ice chips: Not tested   Thin Liquid Thin Liquid: Impaired Presentation: Cup;Straw;Self Fed Pharyngeal  Phase Impairments: Cough - Immediate    Nectar Thick Nectar Thick Liquid: Not tested   Honey Thick Honey Thick Liquid: Not tested   Puree Puree: Within functional limits Presentation: Self Fed;Spoon   Solid     Solid: Within functional limits      Maryanne Huneycutt, Consuelo Fitch 04/02/2024,1:37 PM

## 2024-04-03 ENCOUNTER — Inpatient Hospital Stay (HOSPITAL_COMMUNITY)

## 2024-04-03 DIAGNOSIS — Q282 Arteriovenous malformation of cerebral vessels: Secondary | ICD-10-CM

## 2024-04-03 DIAGNOSIS — I69391 Dysphagia following cerebral infarction: Secondary | ICD-10-CM | POA: Diagnosis not present

## 2024-04-03 DIAGNOSIS — I110 Hypertensive heart disease with heart failure: Secondary | ICD-10-CM | POA: Diagnosis not present

## 2024-04-03 DIAGNOSIS — Z7984 Long term (current) use of oral hypoglycemic drugs: Secondary | ICD-10-CM | POA: Diagnosis not present

## 2024-04-03 DIAGNOSIS — Z7985 Long-term (current) use of injectable non-insulin antidiabetic drugs: Secondary | ICD-10-CM | POA: Diagnosis not present

## 2024-04-03 DIAGNOSIS — I509 Heart failure, unspecified: Secondary | ICD-10-CM | POA: Diagnosis not present

## 2024-04-03 DIAGNOSIS — G936 Cerebral edema: Secondary | ICD-10-CM | POA: Diagnosis not present

## 2024-04-03 DIAGNOSIS — E119 Type 2 diabetes mellitus without complications: Secondary | ICD-10-CM | POA: Diagnosis not present

## 2024-04-03 DIAGNOSIS — I613 Nontraumatic intracerebral hemorrhage in brain stem: Secondary | ICD-10-CM | POA: Diagnosis not present

## 2024-04-03 DIAGNOSIS — E785 Hyperlipidemia, unspecified: Secondary | ICD-10-CM | POA: Diagnosis not present

## 2024-04-03 DIAGNOSIS — I629 Nontraumatic intracranial hemorrhage, unspecified: Secondary | ICD-10-CM | POA: Diagnosis not present

## 2024-04-03 LAB — GLUCOSE, CAPILLARY
Glucose-Capillary: 116 mg/dL — ABNORMAL HIGH (ref 70–99)
Glucose-Capillary: 114 mg/dL — ABNORMAL HIGH (ref 70–99)
Glucose-Capillary: 148 mg/dL — ABNORMAL HIGH (ref 70–99)
Glucose-Capillary: 109 mg/dL — ABNORMAL HIGH (ref 70–99)

## 2024-04-03 LAB — CBC
HCT: 44.7 % (ref 39.0–52.0)
Hemoglobin: 14.9 g/dL (ref 13.0–17.0)
MCH: 27.5 pg (ref 26.0–34.0)
MCHC: 33.3 g/dL (ref 30.0–36.0)
MCV: 82.5 fL (ref 80.0–100.0)
Platelets: 251 K/uL (ref 150–400)
RBC: 5.42 MIL/uL (ref 4.22–5.81)
RDW: 12.6 % (ref 11.5–15.5)
WBC: 7.5 K/uL (ref 4.0–10.5)
nRBC: 0 % (ref 0.0–0.2)

## 2024-04-03 LAB — BASIC METABOLIC PANEL WITH GFR
Anion gap: 10 (ref 5–15)
BUN: 14 mg/dL (ref 8–23)
CO2: 29 mmol/L (ref 22–32)
Calcium: 9.2 mg/dL (ref 8.9–10.3)
Chloride: 100 mmol/L (ref 98–111)
Creatinine, Ser: 1.35 mg/dL — ABNORMAL HIGH (ref 0.61–1.24)
GFR, Estimated: 58 mL/min — ABNORMAL LOW
Glucose, Bld: 117 mg/dL — ABNORMAL HIGH (ref 70–99)
Potassium: 3.3 mmol/L — ABNORMAL LOW (ref 3.5–5.1)
Sodium: 139 mmol/L (ref 135–145)

## 2024-04-03 LAB — MAGNESIUM: Magnesium: 1.4 mg/dL — ABNORMAL LOW (ref 1.7–2.4)

## 2024-04-03 LAB — PHOSPHORUS: Phosphorus: 3.4 mg/dL (ref 2.5–4.6)

## 2024-04-03 MED ORDER — MAGNESIUM SULFATE 2 GM/50ML IV SOLN
2.0000 g | Freq: Once | INTRAVENOUS | Status: AC
  Administered 2024-04-03: 2 g via INTRAVENOUS
  Filled 2024-04-03: qty 50

## 2024-04-03 MED ORDER — METFORMIN HCL 500 MG PO TABS
500.0000 mg | ORAL_TABLET | Freq: Two times a day (BID) | ORAL | Status: DC
  Administered 2024-04-04 – 2024-04-12 (×17): 500 mg via ORAL
  Filled 2024-04-03 (×5): qty 1

## 2024-04-03 MED ORDER — POTASSIUM CHLORIDE 10 MEQ/100ML IV SOLN
10.0000 meq | INTRAVENOUS | Status: AC
  Administered 2024-04-03 (×2): 10 meq via INTRAVENOUS
  Filled 2024-04-03 (×3): qty 100

## 2024-04-03 MED ORDER — ROSUVASTATIN CALCIUM 20 MG PO TABS
20.0000 mg | ORAL_TABLET | Freq: Every day | ORAL | Status: DC
  Administered 2024-04-03 – 2024-04-12 (×10): 20 mg via ORAL
  Filled 2024-04-03 (×3): qty 1

## 2024-04-03 MED ORDER — POTASSIUM CHLORIDE 10 MEQ/100ML IV SOLN
10.0000 meq | INTRAVENOUS | Status: AC
  Administered 2024-04-03 (×2): 10 meq via INTRAVENOUS

## 2024-04-03 NOTE — Progress Notes (Signed)
" °  Progress Note   Patient: Larry Dawson FMW:[id number] DOB: 10/18/1955 DOA: 04/01/2024     1 DOS: the patient was seen and examined on 04/03/2024 at 11:45AM      Brief hospital course: 68 y.o. M with obesity, HTN, DM and recent diagnosis of pontine hemorrhage who presented with worsening right sided weakness, found to have interval progression of hemorrhage.     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     Physical Exam: BP 131/82   Pulse 81   Temp 98.5 F (36.9 C) (Oral)   Resp 16   Ht 5' 7 (1.702 m)   Wt 102.2 kg   SpO2 95%   BMI 35.29 kg/m   Adult male, sitting up in chair, interactive and appropriate RRR, no murmurs, no peripheral edema Respiratory normal, lungs clear without rales or wheezes Extraocular movement appear normal, right face seems to be drooping a little bit, speech fluent, some ataxia in the right arm    Data Reviewed: Discussed with neurosurgery Basic metabolic panel and CBC reviewed and summarized above    Family Communication: Wife and daughters at the bedside    Disposition: Status is: Inpatient         Author: [name], MD 04/03/2024 5:17 PM  For on call review [email or web address]    "

## 2024-04-03 NOTE — Progress Notes (Signed)
 68 year old gentleman with a second hemorrhage in the brainstem.  Patient was seen by me in clinic and we suspected this to be an underlying cavernoma.  Historically speaking, he had a CT scan a couple years ago and on that CT, there is no suspicion of a cavernoma in the pons.  However with this behavior, the differential diagnosis makes a cavernoma rise to the top.  I went over the imaging characteristics of this with him, his wife and their 2 daughters.  We talked about the following options:  First option would be is to do nothing.  With this, he would have, likely, recurrent hemorrhages but this would allow the cavernoma to come to the surface making the morbidity of his surgery lower, if he so desires.  The drawback obviously is that the recurrent hemorrhages would lead to deficits.  The reason why this is a reasonable option is that the deficits incurred by hemorrhages often are not as profound as can be incurred with surgery and patients typically recover well from these.  The second option is to pursue radiosurgery.  The benefit of this is that it is a noninvasive option however the drawback is that radiosurgery typically does not work for 18 to 24 months.  During that time recurrent hemorrhages are also possible.  Radiosurgery has been shown to decrease the risk of hemorrhage from these cavernoma's.  The third option would be is to be do a resection.  The benefit of this would be removal of this cavernoma or, near removal, lowering the risk of hemorrhage but the drawback is that it could result in significant neurological deficits due to the location.  I told him about the risk of double vision, swallowing difficulties requiring feeding tube, paralysis, numbness among others.  I told him that oftentimes with rehabilitation, some of the symptoms can be made better however they can also be permanent.  After discussing this in great detail we decided that they are going to think about this over  the coming week as the hematoma liquefies.  After that we are going to talk again and decide what the best that strategy is.  I have discussed this in detail with Dr. Jonel as well who was present for this conversation for the most part.

## 2024-04-03 NOTE — Progress Notes (Addendum)
 STROKE TEAM PROGRESS NOTE    SIGNIFICANT HOSPITAL EVENTS Presented with worsening of right side weakness numbness ataxia, slurred speech, difficulty swallowing and intermittent diplopia.  He was admitted on 03/16/2024 and found to have a hemorrhagic pontine mass with surrounding edema.  Repeat CT head in the emergency room with increased size of the pontine hemorrhage and he was admitted to the floor  INTERIM HISTORY/SUBJECTIVE Family at the bedside.  Patient is laying in bed in no apparent distress.  He complains of right side tingling, and his whole face feels numb Dr. [name] plans on TSA in the beginning of January  CBC    Component Value Date/Time   WBC 7.5 04/03/2024 0506   RBC 5.42 04/03/2024 0506   HGB 14.9 04/03/2024 0506   HGB 15.0 02/19/2019 1033   HCT 44.7 04/03/2024 0506   HCT 44.6 02/19/2019 1033   PLT 251 04/03/2024 0506   PLT 261 02/19/2019 1033   MCV 82.5 04/03/2024 0506   MCV 84 02/19/2019 1033   MCH 27.5 04/03/2024 0506   MCHC 33.3 04/03/2024 0506   RDW 12.6 04/03/2024 0506   RDW 12.9 02/19/2019 1033   LYMPHSABS 2.4 04/01/2024 1631   LYMPHSABS 2.3 02/19/2019 1033   MONOABS 0.9 04/01/2024 1631   EOSABS 0.1 04/01/2024 1631   EOSABS 0.1 02/19/2019 1033   BASOSABS 0.1 04/01/2024 1631   BASOSABS 0.1 02/19/2019 1033    BMET    Component Value Date/Time   NA 139 04/03/2024 0506   NA 139 05/25/2020 1114   K 3.3 (L) 04/03/2024 0506   CL 100 04/03/2024 0506   CO2 29 04/03/2024 0506   GLUCOSE 117 (H) 04/03/2024 0506   BUN 14 04/03/2024 0506   BUN 20 05/25/2020 1114   CREATININE 1.35 (H) 04/03/2024 0506   CREATININE 1.27 11/19/2015 1212   CALCIUM  9.2 04/03/2024 0506   GFRNONAA 58 (L) 04/03/2024 0506   GFRNONAA 61 11/19/2015 1212    IMAGING past 24 hours DG Swallowing Func-Speech Pathology Result Date: 04/03/2024 Table formatting from the original result was not included. Modified Barium Swallow Study Patient Details Name: Larry Dawson MRN: [id number]  Date of Birth: 05-27-1955 Today's Date: 04/03/2024 HPI/PMH: [name] admitted with  worsening gait. 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     He was admitted approximately 2 weeks ago for this, presented back to the emergency department with worsening right upper and lower extremity weakness, double vision, slurring of his speech, and difficulty swallowing.  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 There is a potential plan for craniotomy in the future.   Clinical Impression Pt demonstrates mild orophaygneal dysphagia with finding of delayed swallow intiation across textures. Pt has good oral containment, but consistently has bolus pool in pyriforms prior to swallow initiation. No penetration or aspiration occurred as a result during study, but suspect pt may have occasional coughing due to timing impairment. Esophageal sweep unrevealing.  Currently, risk of significant, detrimental aspiration is low, though pt has potential for a change in function with surgery. Advised pt to be aware of function and changes with biofeedback and education about swallowing. Recommended reflux precautions - sleeping on a wedge at 30 degress given report of regurgitation. Pt may resume regualr diet and thin liquids. No need for SLP at this time but please reorder as needed. Factors that may increase risk of adverse event in  presence of aspiration Noe & Lianne 2021): No data recorded Recommendations/Plan: Swallowing Evaluation Recommendations Swallowing Evaluation Recommendations Recommendations: PO diet PO Diet Recommendation: Regular; Thin liquids (Level 0) Liquid Administration via: Cup; Straw Medication Administration: Whole meds with liquid Supervision: Patient able to self-feed Swallowing strategies  : Slow rate; Small bites/sips Postural changes: Position pt  fully upright for meals; Stay upright 30-60 min after meals Oral care recommendations: Pt independent with oral care Treatment Plan Treatment Plan Treatment recommendations: No treatment recommended at this time Follow-up recommendations: No SLP follow up Recommendations Recommendations for follow up therapy are one component of a multi-disciplinary discharge planning process, led by the attending physician.  Recommendations may be updated based on patient status, additional functional criteria and insurance authorization. Assessment: Orofacial Exam: Orofacial Exam Oral Cavity - Dentition: Adequate natural dentition Anatomy: Anatomy: WFL Boluses Administered: Boluses Administered Boluses Administered: Thin liquids (Level 0); Mildly thick liquids (Level 2, nectar thick); Moderately thick liquids (Level 3, honey thick); Puree; Solid  Oral Impairment Domain: Oral Impairment Domain Lip Closure: Interlabial escape, no progression to anterior lip Tongue control during bolus hold: Cohesive bolus between tongue to palatal seal Bolus preparation/mastication: Timely and efficient chewing and mashing Bolus transport/lingual motion: Brisk tongue motion Oral residue: Trace residue lining oral structures Location of oral residue : Tongue Initiation of pharyngeal swallow : Pyriform sinuses  Pharyngeal Impairment Domain: Pharyngeal Impairment Domain Soft palate elevation: No bolus between soft palate (SP)/pharyngeal wall (PW) Laryngeal elevation: Complete superior movement of thyroid  cartilage with complete approximation of arytenoids to epiglottic petiole Anterior hyoid excursion: Complete anterior movement Epiglottic movement: Complete inversion Laryngeal vestibule closure: Complete, no air/contrast in laryngeal vestibule Pharyngeal stripping wave : Present - complete Pharyngoesophageal segment opening: Complete distension and complete duration, no obstruction of flow Tongue base retraction: No contrast between tongue base and  posterior pharyngeal wall (PPW) Pharyngeal residue: Trace residue within or on pharyngeal structures Location of pharyngeal residue: Valleculae; Pyriform sinuses  Esophageal Impairment Domain: Esophageal Impairment Domain Esophageal clearance upright position: Complete clearance, esophageal coating Pill: Pill Consistency administered: Thin liquids (Level 0) Thin liquids (Level 0): [facility] Penetration/Aspiration Scale Score: Penetration/Aspiration Scale Score 1.  Material does not enter airway: Thin liquids (Level 0); Mildly thick liquids (Level 2, nectar thick); Moderately thick liquids (Level 3, honey thick); Puree; Solid; Pill Compensatory Strategies: Compensatory Strategies Compensatory strategies: No   General Information: Caregiver present: No  Diet Prior to this Study: Dysphagia 3 (mechanical soft); Thin liquids (Level 0)   Temperature : Normal   Respiratory Status: WFL   Supplemental O2: None (Room air)   History of Recent Intubation: No  Behavior/Cognition: Alert; Cooperative; Pleasant mood Self-Feeding Abilities: Needs assist with self-feeding; Needs set-up for self-feeding Baseline vocal quality/speech: Normal Volitional Cough: Able to elicit Volitional Swallow: Unable to elicit Exam Limitations: No limitations Goal Planning: Prognosis for improved oropharyngeal function: Good No data recorded No data recorded No data recorded No data recorded Pain: No data recorded End of Session: Start Time:SLP Start Time (ACUTE ONLY): 0920 Stop Time: SLP Stop Time (ACUTE ONLY): 0947 Time Calculation:SLP Time Calculation (min) (ACUTE ONLY): 27 min Charges: SLP Evaluations $ SLP Speech Visit: 1 Visit SLP Evaluations $BSS Swallow: 1 Procedure $MBS Swallow: 1 Procedure $Swallowing Treatment: 1 Procedure SLP visit diagnosis: SLP Visit Diagnosis: Dysphagia, oropharyngeal phase (R13.12) Past Medical History: Past Medical History: Diagnosis Date  Allergy   Anal fissure   COPD (chronic obstructive pulmonary disease) (HCC)    Albuterol  PRN.  Hydrocele   Hyperlipidemia   Hypertension  IBS (irritable bowel syndrome) 05/22/13  per chart note-pt denied Past Surgical History: Past Surgical History: Procedure Laterality Date  HERNIA REPAIR  prior to 2013  HYDROCELE EXCISION Bilateral 11/23/2013  Procedure: HYDROCELECTOMY ADULT;  Surgeon: Alm GORMAN Fragmin, MD;  Location: [facility];  Service: Urology;  Laterality: Bilateral;  VASECTOMY  prior to 2013 DeBlois, Consuelo Fitch 04/03/2024, 10:56 AM   Vitals:   04/02/24 2014 04/02/24 2100 04/02/24 2308 04/03/24 0344  BP: 135/75 132/80 138/81 136/78  Pulse: 74 76 74 74  Resp: 18  18 18   Temp: 98.6 F (37 C)  97.8 F (36.6 C) 97.6 F (36.4 C)  TempSrc:   Oral Oral  SpO2: 98% 97% 98% 93%  Weight:      Height:         PHYSICAL EXAM General:  Alert, well-nourished, well-developed patient in no acute distress Psych:  Mood and affect appropriate for situation CV: Regular rate and rhythm on monitor Respiratory:  Regular, unlabored respirations on room air GI: Abdomen soft and nontender   NEURO:  Mental Status: AA&Ox3, patient is able to give clear and coherent history Speech/Language: speech is without aphasia, has dysarthria.  Naming, repetition, fluency, and comprehension intact.  Cranial Nerves:  II: PERRL. Visual fields full.  III, IV, VI: EOMI. Eyelids elevate symmetrically.  Left 6th nerve palsy V: Numbness of face VII: Right facial droop VIII: hearing intact to voice. IX, X: Palate elevates symmetrically. Phonation is normal.  KP:Dynloizm shrug 5/5. XII: tongue is midline without fasciculations. Motor: 5/5 strength left arm and leg, right arm 4/5 with drift, right leg 5/5 Tone: is normal and bulk is normal Sensation- Intact to light touch bilaterally. Extinction absent to light touch to DSS.  Subjective numbness on the right side of body Coordination: Ataxia in both arm and leg on right side Gait- deferred  ASSESSMENT/PLAN  Mr. JACUB WAITERS is a 68 y.o. male with history of  COPD, HTN, HLD, IBS, hydrocele who initially [facility] with feeling off balance for several days, head numbness for 4-5 days. 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 He was admitted to [facility]. He had MRI Brain with and without contrast which showed stable IPH. Repeat CT Head 2 days later along with CTA head and neck shows stable IPH with no vascular malformation or aneurysm.   Wife and daughter at the bedside reports a gradual decline over the last couple of weeks. Now unable to stand and very unsteady on his feet, R side giving him trouble, difficulties with swallowing, coughing with food intake, and slurring his speech and intermittent diplopia.   Repeat CT Head today with increase in the size of pontine hemorrhage.   Intracerebral Hemorrhage:  right pontine with edema  Etiology: Possible cavernoma CT head  1. Interval increase in size of known pontine hemorrhage, now measuring 2.5 x 2.3 cm, previously 1.8 x 1.6 cm. Increased surrounding hypodense edema with slightly expanded appearance of the pons. Slight increased effacement of prepontine cistern/CSF spaces.Fourth ventricle remains patent. 2. Atrophy and chronic small vessel ischemic changes of the white matter. Chronic lacunar infarcts in the basal ganglia and thalamus. MRI  1. 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 A solitary metastasis is a less likely consideration. Repeat CT head stable Carotid Doppler 12/25 negative 2D Echo 12/25 EF 50 to 55%  with mild concentric LVH.  Left atrium mildly dilated Neurosurgery following: ongoing discussions with patient and family regarding treatment options and timing. LDL 56 HgbA1c 7.2 VTE prophylaxis -SCDs No antithrombotic prior to admission, now on  No antithrombotic  Therapy recommendations:  Pending Disposition: Pending  Hx of Stroke/TIA Chronic lacunar infarcts in the basal ganglia and thalamus.  Hypertension Home meds: Atenolol -chlorthalidone  50/25 mg, Stable Blood pressure goal less than 140  Hyperlipidemia Home meds: Crestor  20 mg,  resumed in hospital LDL 56, goal < 70 Continue statin at discharge  Diabetes type II UnControlled Home meds: Mounjaro , metformin , glipizide  HgbA1c 7.2, goal < 7.0 CBGs SSI Recommend close follow-up with PCP for better DM control   Dysphagia Patient has post-stroke dysphagia, SLP consulted    Diet   Diet regular Room service appropriate? Yes; Fluid consistency: Thin   Advance diet as tolerated  Hypokalemia Hypomagnesemia  K 3.3, Mg 1.4 - replaced by primary   Other Stroke Risk Factors Obesity, Body mass index is 35.29 kg/m., BMI >/= 30 associated with increased stroke risk, recommend weight loss, diet and exercise as appropriate  Coronary artery disease Congestive heart failure  Hospital day # 1    Karna Geralds DNP, ACNPC-AG  [facility]   I have edited this note to reflect my assessment and plan.   Thedford Homans, MD Vascular Neurology  To contact Stroke Continuity provider, please refer to [email or web address]. After hours, contact General Neurology

## 2024-04-03 NOTE — Hospital Course (Signed)
 68 y.o. M with obesity, HTN, DM and recent diagnosis of pontine hemorrhage who presented with worsening right sided weakness, found to have interval progression of hemorrhage.

## 2024-04-03 NOTE — Progress Notes (Signed)
 Modified Barium Swallow Study  Patient Details  Name: Larry Dawson MRN: [id number] Date of Birth: 1955-06-20  Today's Date: 04/03/2024  Modified Barium Swallow completed.  Full report located under Chart Review in the Imaging Section.  History of Present Illness [name] admitted with  worsening gait. 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     He was admitted approximately 2 weeks ago for this, presented back to the emergency department with worsening right upper and lower extremity weakness, double vision, slurring of his speech, and difficulty swallowing.  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 There is a potential plan for craniotomy in the future.    Clinical Impression Pt demonstrates mild orophaygneal dysphagia with finding of delayed swallow intiation across textures. Pt has good oral containment, but consistently has bolus pool in pyriforms prior to swallow initiation. No penetration or aspiration occurred as a result during study, but suspect pt may have occasional coughing due to timing impairment. Esophageal sweep unrevealing.  Currently, risk of significant, detrimental aspiration is low, though pt has potential for a change in function with surgery. Advised pt to be aware of function and changes with biofeedback and education about swallowing. Recommended reflux precautions - sleeping on a wedge at 30 degress given report of regurgitation. Pt may resume regualr diet and thin liquids. No need for SLP at this time but please reorder as needed. Factors that may increase risk of adverse event in presence of aspiration Noe & Lianne 2021):    Swallow Evaluation Recommendations Recommendations: PO diet PO Diet Recommendation: Regular;Thin liquids (Level 0) Liquid Administration via: Cup;Straw Medication Administration:  Whole meds with liquid Supervision: Patient able to self-feed Swallowing strategies  : Slow rate;Small bites/sips Postural changes: Position pt fully upright for meals;Stay upright 30-60 min after meals Oral care recommendations: Pt independent with oral care      Sinclair Arrazola, Consuelo Fitch 04/03/2024,10:54 AM

## 2024-04-04 DIAGNOSIS — Q283 Other malformations of cerebral vessels: Secondary | ICD-10-CM | POA: Diagnosis not present

## 2024-04-04 DIAGNOSIS — I613 Nontraumatic intracerebral hemorrhage in brain stem: Secondary | ICD-10-CM | POA: Diagnosis not present

## 2024-04-04 DIAGNOSIS — I629 Nontraumatic intracranial hemorrhage, unspecified: Secondary | ICD-10-CM | POA: Diagnosis not present

## 2024-04-04 MED ORDER — SENNOSIDES-DOCUSATE SODIUM 8.6-50 MG PO TABS
1.0000 | ORAL_TABLET | Freq: Once | ORAL | Status: AC
  Administered 2024-04-04: 1 via ORAL
  Filled 2024-04-04: qty 1

## 2024-04-04 MED ORDER — POLYETHYLENE GLYCOL 3350 17 G PO PACK
17.0000 g | PACK | Freq: Once | ORAL | Status: AC
  Administered 2024-04-04: 17 g via ORAL
  Filled 2024-04-04: qty 1

## 2024-04-04 MED ORDER — ALUM & MAG HYDROXIDE-SIMETH 200-200-20 MG/5ML PO SUSP
30.0000 mL | ORAL | Status: DC | PRN
  Administered 2024-04-04: 30 mL via ORAL
  Filled 2024-04-04: qty 30

## 2024-04-04 NOTE — Progress Notes (Addendum)
 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  This is primarily managed by my partner Dr.Janjua.  Dr. Janjua had a long discussion with him yesterday.  He and his wife have discussed his options, and have elected to move forward with surgical intervention.  Dr. Janjua and his team will schedule surgical intervention.  Allowing his hemorrhage to liquefy for few days will be optimal for  the surgery, so we will plan for surgery with Dr. Janjua in the coming 2 weeks.  Reeves Daisy MD, [facility] Department of Neurosurgery

## 2024-04-04 NOTE — Plan of Care (Signed)

## 2024-04-04 NOTE — Evaluation (Signed)
 Physical Therapy Evaluation Patient Details Name: Larry Dawson MRN: [id number] DOB: [birth year] Today's Date: 04/04/2024  History of Present Illness  68 y.o. M with recent diagnosis of pontine hemorrhage who presented with worsening right sided weakness, found to have interval progression of hemorrhage. 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   Clinical Impression  Pt admitted with above. 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 Pt now presenting with R hemiparesis, double vision, impaired balance, and requires mod to maxA for mobility. Pt with R LE impaired coordination and ataxia requiring use of RW and modAx2 for safe amb. Aware patient and medical team are still determining best course of action regarding patient hemorrhage however at this time PT recommending inpatient rehab program > 3 hrs a day as pt is motivated and demo's excellent rehab potential and has a good home set up and excellent family support. Acute PT to cont to follow.        If plan is discharge home, recommend the following: A lot of help with walking and/or transfers;A lot of help with bathing/dressing/bathroom;Assist for transportation;Help with stairs or ramp for entrance;Assistance with cooking/housework   Can travel by private vehicle        Equipment Recommendations Rolling walker (2 wheels)  Recommendations for Other Services  Rehab consult    Functional Status Assessment Patient has had a recent decline in their functional status and demonstrates the ability to make significant improvements in function in a reasonable and predictable amount of time.     Precautions / Restrictions Precautions Precautions: Fall Recall of Precautions/Restrictions: Intact Restrictions Weight Bearing Restrictions Per Provider Order: No      Mobility  Bed Mobility               General bed  mobility comments: pt received up in chair    Transfers Overall transfer level: Needs assistance Equipment used: Rolling walker (2 wheels) Transfers: Sit to/from Stand Sit to Stand: Mod assist           General transfer comment: assist to keep R hand on RW and verbal cues to push up with L UE on arm rest, modA to power up, completed 3 sit to stands    Ambulation/Gait Ambulation/Gait assistance: Mod assist, +2 safety/equipment Gait Distance (Feet): 50 Feet Assistive device: Rolling walker (2 wheels) Gait Pattern/deviations: Step-to pattern, Decreased stance time - right, Decreased step length - left, Decreased dorsiflexion - right, Scissoring, Ataxic Gait velocity: dec Gait velocity interpretation: <1.31 ft/sec, indicative of household ambulator   General Gait Details: verbal cues to pick up R foot to clear it, keep R foot on R side of walker to prevent scissoring, due to impaired sensation pt looking down at feet  Stairs            Wheelchair Mobility     Tilt Bed    Modified Rankin (Stroke Patients Only) Modified Rankin (Stroke Patients Only) Pre-Morbid Rankin Score: No significant disability Modified Rankin: Moderately severe disability     Balance Overall balance assessment: Needs assistance         Standing balance support: Bilateral upper extremity supported, During functional activity, Reliant on assistive device for balance Standing balance-Leahy Scale: Zero Standing balance comment: reliant on RW and external support  Pertinent Vitals/Pain Pain Assessment Pain Assessment: Faces Faces Pain Scale: Hurts little more Pain Location: posterior headache Pain Descriptors / Indicators: Headache Pain Intervention(s): Patient requesting pain meds-RN notified    Home Living Family/patient expects to be discharged to:: Private residence Living Arrangements: Spouse/significant other Available Help at Discharge:  Family;Available 24 hours/day Type of Home: House Home Access: Stairs to enter Entrance Stairs-Rails: Right Entrance Stairs-Number of Steps: 4 Alternate Level Stairs-Number of Steps: flight Home Layout: Two level;Bed/bath upstairs Home Equipment: None      Prior Function Prior Level of Function : Independent/Modified Independent;Working/employed;Driving             Mobility Comments: indep, no AD, worked driving tour buses ADLs Comments: indep     Extremity/Trunk Assessment   Upper Extremity Assessment Upper Extremity Assessment: RUE deficits/detail RUE Deficits / Details: grossly 3-/5, impaired coordination (both gross and fine motor), 75% sensory impairment, reports UE to feel rough RUE Sensation: decreased light touch RUE Coordination: decreased fine motor;decreased gross motor    Lower Extremity Assessment Lower Extremity Assessment: RLE deficits/detail RLE Deficits / Details: grossly 3/5, impaired coordination/ataxia, 75% sensory deficit RLE Sensation: decreased light touch;decreased proprioception RLE Coordination: decreased gross motor    Cervical / Trunk Assessment Cervical / Trunk Assessment: Normal  Communication   Communication Communication: Impaired Factors Affecting Communication: Difficulty expressing self;Reduced clarity of speech    Cognition Arousal: Alert Behavior During Therapy: WFL for tasks assessed/performed   PT - Cognitive impairments: No apparent impairments                         Following commands: Impaired Following commands impaired: Follows one step commands with increased time (delayed response time)     Cueing Cueing Techniques: Verbal cues, Visual cues, Tactile cues     General Comments General comments (skin integrity, edema, etc.): pt with R ptosis of eye    Exercises General Exercises - Lower Extremity Long Arc Quad: AROM, Right, 10 reps, Seated (with 5 sec hold at end range) Hip Flexion/Marching: AROM,  Right, 10 reps, Seated (with 5 sec hold at top) Toe Raises: AROM, Right, 10 reps, Seated (with 5 sec hold at top)   Assessment/Plan    PT Assessment Patient needs continued PT services  PT Problem List Decreased strength;Decreased activity tolerance;Decreased mobility;Decreased balance;Decreased coordination       PT Treatment Interventions DME instruction;Gait training;Stair training;Functional mobility training;Therapeutic activities;Neuromuscular re-education;Balance training;Therapeutic exercise    PT Goals (Current goals can be found in the Care Plan section)  Acute Rehab PT Goals Patient Stated Goal: home PT Goal Formulation: With patient Time For Goal Achievement: 04/18/24 Potential to Achieve Goals: Fair    Frequency Min 4X/week     Co-evaluation               AM-PAC PT 6 Clicks Mobility  Outcome Measure Help needed turning from your back to your side while in a flat bed without using bedrails?: A Lot Help needed moving from lying on your back to sitting on the side of a flat bed without using bedrails?: A Lot Help needed moving to and from a bed to a chair (including a wheelchair)?: A Lot Help needed standing up from a chair using your arms (e.g., wheelchair or bedside chair)?: A Lot Help needed to walk in hospital room?: A Lot Help needed climbing 3-5 steps with a railing? : Total 6 Click Score: 11    End of Session Equipment Utilized During Treatment: Gait  belt Activity Tolerance: Patient tolerated treatment well Patient left: in chair;with call bell/phone within reach;with chair alarm set;with family/visitor present Nurse Communication: Mobility status;Patient requests pain meds (for headache) PT Visit Diagnosis: Unsteadiness on feet (R26.81);Muscle weakness (generalized) (M62.81);Difficulty in walking, not elsewhere classified (R26.2);Hemiplegia and hemiparesis Hemiplegia - Right/Left: Right Hemiplegia - dominant/non-dominant: Dominant Hemiplegia -  caused by: Nontraumatic intracerebral hemorrhage    Time: 8761-8686 PT Time Calculation (min) (ACUTE ONLY): 35 min   Charges:   PT Evaluation $PT Eval Moderate Complexity: 1 Mod PT Treatments $Gait Training: 8-22 mins PT General Charges $$ ACUTE PT VISIT: 1 Visit         Norene Ames, PT, DPT Acute Rehabilitation Services Secure chat preferred Office #: [phone]   Norene CHRISTELLA Ames 04/04/2024, 3:06 PM

## 2024-04-04 NOTE — Progress Notes (Signed)
 Inpatient Rehab Admissions Coordinator Note:   Per PT patient was screened for CIR candidacy by Ifeanyichukwu Wickham SHAUNNA Yvone Cohens, CCC-SLP. At this time, pt is not medically ready for potential CIR admission. Pt has pending surgical intervention. Will not pursue a rehab consult at this time. CIR admissions team will follow to monitor for medical readiness. A consult order will be  placed if pt appears to be an appropriate candidate.   Tinnie Yvone Cohens, MS, CCC-SLP Admissions Coordinator [phone] 04/04/2024 5:59 PM

## 2024-04-04 NOTE — Progress Notes (Signed)
" °  Progress Note   Patient: Larry Dawson FMW:[id number] DOB: 01/20/1956 DOA: 04/01/2024     2 DOS: the patient was seen and examined on 04/04/2024 at 9:10AM      Brief hospital course: 68 y.o. M with obesity, HTN, DM and recent diagnosis of pontine hemorrhage who presented with worsening right sided weakness, found to have interval progression of hemorrhage.     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  Still has numbness in the right hand and ataxia in the right hand.     Physical Exam: BP 117/84 (BP Location: Left Arm)   Pulse 76   Temp 97.7 F (36.5 C) (Oral)   Resp 18   Ht 5' 7 (1.702 m)   Wt 102.2 kg   SpO2 96%   BMI 35.29 kg/m   Adult male, sitting up in recliner, watching television RRR, no murmurs, no peripheral edema Respiratory rate normal, lungs clear without rales or wheezes Pupils are equal.  Mild right eyelid droop.  EOMI without nystagmus.  Face reportedly numb bilaterally in all dermatomes.  Cranial nerve VIII within normal limits, cranial nerves IX and X show equal palate elevation, cranial nerve XI shows sternocleidomastoid strong, cranial nerve XII midline.   Motor strength 5 -/5 in the right upper extremity extension, otherwise seems 5 -/5 in both legs, and 5/5 in the left arm.  He has some ataxia with finger-to-nose testing  of the right hand, left is normal.  Sensory examination is abnormal to light touch in the right hand.  Naming is grossly intact, recent and remote recall seems normal, general fund of knowledge seems normal, attention span and concentration normal        Data Reviewed: Discussed with neurosurgery      Family Communication:     Disposition: Status is: Inpatient         Author: [name], MD 04/04/2024 11:32 AM  For on call review [email or web address]    "

## 2024-04-05 DIAGNOSIS — I629 Nontraumatic intracranial hemorrhage, unspecified: Secondary | ICD-10-CM | POA: Diagnosis not present

## 2024-04-05 DIAGNOSIS — Q283 Other malformations of cerebral vessels: Secondary | ICD-10-CM | POA: Diagnosis not present

## 2024-04-05 DIAGNOSIS — I613 Nontraumatic intracerebral hemorrhage in brain stem: Secondary | ICD-10-CM | POA: Diagnosis not present

## 2024-04-05 LAB — BASIC METABOLIC PANEL WITH GFR
Anion gap: 12 (ref 5–15)
BUN: 15 mg/dL (ref 8–23)
CO2: 24 mmol/L (ref 22–32)
Calcium: 9.4 mg/dL (ref 8.9–10.3)
Chloride: 100 mmol/L (ref 98–111)
Creatinine, Ser: 1.17 mg/dL (ref 0.61–1.24)
GFR, Estimated: >60 mL/min
Glucose, Bld: 113 mg/dL — ABNORMAL HIGH (ref 70–99)
Potassium: 3.6 mmol/L (ref 3.5–5.1)
Sodium: 135 mmol/L (ref 135–145)

## 2024-04-05 LAB — CBC
HCT: 44.4 % (ref 39.0–52.0)
Hemoglobin: 15.2 g/dL (ref 13.0–17.0)
MCH: 27.9 pg (ref 26.0–34.0)
MCHC: 34.2 g/dL (ref 30.0–36.0)
MCV: 81.6 fL (ref 80.0–100.0)
Platelets: 292 K/uL (ref 150–400)
RBC: 5.44 MIL/uL (ref 4.22–5.81)
RDW: 12.6 % (ref 11.5–15.5)
WBC: 8 K/uL (ref 4.0–10.5)
nRBC: 0 % (ref 0.0–0.2)

## 2024-04-05 LAB — MAGNESIUM: Magnesium: 1.8 mg/dL (ref 1.7–2.4)

## 2024-04-05 LAB — HEMOGLOBIN A1C
Hgb A1c MFr Bld: 6.8 % — ABNORMAL HIGH (ref 4.8–5.6)
Mean Plasma Glucose: 148.46 mg/dL

## 2024-04-05 MED ORDER — ORAL CARE MOUTH RINSE: 15.0000 mL | OROMUCOSAL | Status: DC | PRN

## 2024-04-05 NOTE — Progress Notes (Signed)
" °  Progress Note   Patient: Larry Dawson FMW:[id number] DOB: 05/20/1955 DOA: 04/01/2024     3 DOS: the patient was seen and examined on 04/05/2024 at 9:03 AM      Brief hospital course: 68 y.o. M with obesity, HTN, DM and recent diagnosis of pontine hemorrhage who presented with worsening right sided weakness, found to have interval progression of hemorrhage.     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 Cytotoxic edema Neurosurgery planning surgery later this admission - PT/OT  Hypertension Blood pressure normal off medication - Hold home atenolol -chlorthalidone  - As needed hydralazine   Type 2 diabetes, well-controlled, with hyperglycemia Glucoses controlled in the last 24 hours, hemoglobin A1c well-controlled - Continue SS corrections - Continue metformin  - Hold glipizide  - Continue Crestor   Class II obesity BMI 35.2, complicates care - Hold home Mounjaro   Hypokalemia Hypomagnesemia Supplemented  COPD No active symptoms             Subjective: No clinical change     Physical Exam: BP 138/82 (BP Location: Right Arm)   Pulse 90   Temp 98 F (36.7 C) (Oral)   Resp 16   Ht 5' 7 (1.702 m)   Wt 102.2 kg   SpO2 95%   BMI 35.29 kg/m   Patient was seen and examined, his neurological exam remained stable Adult male, sitting up in recliner, watching television RRR, no murmurs, no peripheral edema Respiratory rate normal, lungs clear without rales or wheezes Pupils are equal.  Mild right eyelid droop.  EOMI without nystagmus.  Face reportedly numb bilaterally in all dermatomes.  Cranial nerve VIII within normal limits, cranial nerves IX and X show equal palate elevation, cranial nerve XI shows sternocleidomastoid strong, cranial nerve XII midline.   Motor strength 5 -/5 in the right upper extremity extension, otherwise seems 5 -/5 in both legs, and 5/5 in the left arm.  He has some ataxia with finger-to-nose testing of  the right hand, left is normal.  Sensory examination is abnormal to light touch in the right hand.  Naming is grossly intact, recent and remote recall seems normal, general fund of knowledge seems normal, attention span and concentration normal        Data Reviewed: Discussed with neurosurgery Basic metabolic panel normal CBC normal Hemoglobin A1c 6.8%    Family Communication:     Disposition: Status is: Inpatient         Author: [name], MD 04/05/2024 6:24 PM  For on call review [email or web address]    "

## 2024-04-05 NOTE — Progress Notes (Signed)
 Occupational Therapy Evaluation:  Clinical Impression: Larry Dawson is typically independent in ADL and mobility. 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 Today he presents with deficits in vision, RUE (motor and sensory), RLE, balance. Pt overall requires set up to max A for ADL, and mod to max A for pivot transfers with RW. He is also reporting double vision - initially he was not reporting any, but with assessment he identified in LLQ. He will benefit from skilled OT acutely as well as post-acute at comprehensive intensive rehab of >3 hours daily. He is HIGHLY motivated and eager to maximize his safety and independence. Next session plan to continue to assess vision (currently using partial occlusion on his own glasses - very unusual placement of tape) as well as continue functional WB for RUE during functional transfers and ADL.     04/05/24 1100  OT Visit Information  Last OT Received On 04/05/24  Assistance Needed +2 (for amb)  History of Present Illness 68 y.o. M with recent diagnosis of pontine hemorrhage who presented with worsening right sided weakness, found to have interval progression of hemorrhage. 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  Precautions  Precautions Fall  Recall of Precautions/Restrictions Intact  Restrictions  Weight Bearing Restrictions Per Provider Order No  Home Living  Family/patient expects to be discharged to: Private residence  Living Arrangements Spouse/significant other  Available Help at Discharge Family;Available 24 hours/day  Type of Home House  Home Access Stairs to enter  Entrance Stairs-Number of Steps 4  Entrance Stairs-Rails Right  Home Layout Two level;Bed/bath upstairs  Alternate Level Stairs-Number of Steps flight  Alternate Level Stairs-Rails Right  Bathroom Shower/Tub Walk-in shower  Bathroom Toilet Handicapped  height  Home Equipment None  Additional Comments drives a tour bus  Prior Function  Prior Level of Function  Independent/Modified Independent;Working/employed;Driving  Mobility Comments indep, no AD, worked driving tour buses  ADLs Comments indep, working drives a tour bus  Pain Assessment  Pain Assessment Faces  Faces Pain Scale 4  Pain Location posterior headache  Pain Descriptors / Indicators Headache  Pain Intervention(s) Patient requesting pain meds-RN notified  Cognition  Arousal Alert  Behavior During Therapy WFL for tasks assessed/performed  Cognition No family/caregiver present to determine baseline;Cognition impaired  Awareness Online awareness impaired  Memory impairment (select all impairments) Short-term memory  OT - Cognition Comments will continue to assess. Pt frequently repeating himself with stories. most pleasant and motivated patient. slow processing  Following Commands  Following commands Impaired  Following commands impaired Follows one step commands with increased time (delayed response time)  Cueing  Cueing Techniques Verbal cues;Visual cues;Tactile cues  Communication  Communication Impaired  Factors Affecting Communication Difficulty expressing self;Reduced clarity of speech  Upper Extremity Assessment  Upper Extremity Assessment Right hand dominant;RUE deficits/detail  RUE Deficits / Details grossly 3-/5, impaired coordination (both gross and fine motor), 75% sensory impairment, reports UE to feel tingly, decreased proprioception (also vision impacted)  RUE Sensation decreased light touch;decreased proprioception  RUE Coordination decreased fine motor;decreased gross motor  Lower Extremity Assessment  Lower Extremity Assessment Defer to PT evaluation  Cervical / Trunk Assessment  Cervical / Trunk Assessment Normal  Vision- History  Baseline Vision/History 1 Wears glasses  Ability to See in Adequate Light 1 Impaired  Patient Visual Report Diplopia   Vision- Assessment  Vision Assessment? Yes  Eye Alignment [facility]  Ocular Range  of Motion [facility]  Tracking/Visual Pursuits Impaired - to be further tested in functional context (increased time and effort to track, results in chin/head turns)  Diplopia Assessment Disappears with one eye closed;Objects split side to side;Present all the time/all directions (present in lower gaze)  Depth Perception Undershoots  Additional Comments Pt denied double vision throughout assessment until trying to perform target in [place], then after taping reports I can read the signs  ADL  Overall ADL's  Needs assistance/impaired  Eating/Feeding Minimal assistance;Sitting  Grooming Wash/dry face;Oral care;Minimal assistance;Sitting  Grooming Details (indicate cue type and reason) good seated balance, extra time and effort  Upper Body Bathing Moderate assistance  Lower Body Bathing Moderate assistance  Upper Body Dressing  Minimal assistance;Sitting  Upper Body Dressing Details (indicate cue type and reason) and extra time  Lower Body Dressing Maximal assistance;Sit to/from Scientist, Research (life Sciences) Minimal assistance;Stand-pivot;BSC/3in1;Rolling walker (2 wheels)  Toilet Transfer Details (indicate cue type and reason) pushes up through RW with BUE  Functional mobility during ADLs Moderate assistance;+2 for safety/equipment;Rolling walker (2 wheels) (only SPT today)  General ADL Comments decreased sensation and motor control of RUE, visual deficits decreased balance, strength.  Bed Mobility  Overal bed mobility Needs Assistance  Bed Mobility Supine to Sit  Supine to sit Supervision;Used rails;HOB elevated  General bed mobility comments no physical assist for bed mobility  Transfers  Overall transfer level Needs assistance  Equipment used Rolling walker (2 wheels)  Transfers Sit to/from Stand  Sit to Stand Mod assist;From elevated surface  General transfer comment vc for safe hand placement, assist to keep R  hand on RW modA to power up from bed, completed 3 sit to stands from recliner without RW  Balance  Overall balance assessment Needs assistance  Sitting-balance support No upper extremity supported  Sitting balance-Leahy Scale Normal  Sitting balance - Comments No concerns for sitting balance.  Standing balance support Bilateral upper extremity supported;During functional activity;Reliant on assistive device for balance  Standing balance-Leahy Scale Poor  Standing balance comment reliant on RW and external support  OT - End of Session  Equipment Utilized During Treatment Gait belt;Rolling walker (2 wheels)  Activity Tolerance Patient tolerated treatment well  Patient left in chair;with call bell/phone within reach;with chair alarm set  Nurse Communication Mobility status;Precautions  OT Assessment  OT Recommendation/Assessment Patient needs continued OT Services  OT Visit Diagnosis Unsteadiness on feet (R26.81);Other abnormalities of gait and mobility (R26.89);Muscle weakness (generalized) (M62.81);Low vision, both eyes (H54.2);Other symptoms and signs involving the nervous system (R29.898);Hemiplegia and hemiparesis  Hemiplegia - Right/Left Right  Hemiplegia - dominant/non-dominant Dominant  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  OT Problem List Decreased strength;Decreased range of motion;Decreased activity tolerance;Impaired balance (sitting and/or standing);Impaired vision/perception;Decreased coordination;Decreased safety awareness;Decreased knowledge of use of DME or AE;Decreased knowledge of precautions;Impaired UE functional use  OT Plan  OT Frequency (ACUTE ONLY) Min 2X/week  OT Treatment/Interventions (ACUTE ONLY) Self-care/ADL training;Therapeutic exercise;Neuromuscular education;DME and/or AE instruction;Therapeutic activities;Visual/perceptual remediation/compensation;Patient/family education;Balance training  AM-PAC OT 6 Clicks Daily Activity Outcome  Measure (Version 2)  Help from another person eating meals? 3  Help from another person taking care of personal grooming? 3  Help from another person toileting, which includes using toliet, bedpan, or urinal? 2  Help from another person bathing (including washing, rinsing, drying)? 2  Help from another person to put on and taking off regular upper body clothing? 3  Help from another person to put on and taking off regular lower body clothing? 2  6 Click Score 15  Progressive Mobility  What is the highest level of mobility based on the mobility assessment? Level 4 (Ambulates with assistance) - Balance while stepping forward/back - Complete  Activity Pivoted/transferred from bed to chair  OT Recommendation  Recommendations for Other Services Rehab consult;PT consult  Follow Up Recommendations Acute inpatient rehab (3hours/day)  Patient can return home with the following Two people to help with walking and/or transfers;A lot of help with bathing/dressing/bathroom;Assistance with cooking/housework;Assist for transportation;Help with stairs or ramp for entrance  Functional Status Assessent Patient has had a recent decline in their functional status and demonstrates the ability to make significant improvements in function in a reasonable and predictable amount of time.  OT Equipment Other (comment) (defer to next level of care)  Individuals Consulted  Consulted and Agree with Results and Recommendations Patient  Acute Rehab OT Goals  Patient Stated Goal get back to independence and tour bus  OT Goal Formulation With patient  Time For Goal Achievement 04/19/24  Potential to Achieve Goals Good  OT Time Calculation  OT Start Time (ACUTE ONLY) 1034  OT Stop Time (ACUTE ONLY) 1120  OT Time Calculation (min) 46 min  OT General Charges  $OT Visit 1 Visit  OT Evaluation  $OT Eval Moderate Complexity 1 Mod  OT Treatments  $Self Care/Home Management  8-22 mins  $Therapeutic Activity 8-22 mins    Leita DEL OTR/L Acute Rehabilitation Services Office: [phone]

## 2024-04-05 NOTE — Progress Notes (Signed)
 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  This is primarily managed by my partner Dr.Janjua.  Allowing his hemorrhage to liquefy for few days will be optimal for surgery, so we will plan for surgery with Dr. Janjua in the coming 2 weeks.  - Continue PTOT  Reeves Daisy MD, [facility] Department of Neurosurgery

## 2024-04-06 ENCOUNTER — Other Ambulatory Visit: Payer: Self-pay

## 2024-04-06 DIAGNOSIS — I629 Nontraumatic intracranial hemorrhage, unspecified: Secondary | ICD-10-CM | POA: Diagnosis not present

## 2024-04-06 DIAGNOSIS — Q283 Other malformations of cerebral vessels: Secondary | ICD-10-CM | POA: Diagnosis not present

## 2024-04-06 DIAGNOSIS — I613 Nontraumatic intracerebral hemorrhage in brain stem: Secondary | ICD-10-CM | POA: Diagnosis not present

## 2024-04-06 DIAGNOSIS — D1802 Hemangioma of intracranial structures: Secondary | ICD-10-CM

## 2024-04-06 NOTE — Progress Notes (Signed)
" °  Progress Note   Patient: Larry Dawson FMW:[id number] DOB: 07/28/1955 DOA: 04/01/2024     4 DOS: the patient was seen and examined on 04/06/2024 at 1:56 PM      Brief hospital course: 68 y.o. M with obesity, HTN, DM and recent diagnosis of pontine hemorrhage who presented with worsening right sided weakness, found to have interval progression of hemorrhage.     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  Vision improved with taping of glasses.     Physical Exam: BP 132/88 (BP Location: Right Arm)   Pulse 80   Temp 98.5 F (36.9 C) (Oral)   Resp 18   Ht 5' 7 (1.702 m)   Wt 102.2 kg   SpO2 90%   BMI 35.29 kg/m   Adult male, sitting up in recliner, talking with friends RRR, no murmurs, no peripheral edema Respiratory rate normal, lungs clear without rales or wheezes Stable right eyelid droop, stable amblyopia, cranial nerve VIII within normal limits, motor strength 5-/5 in the right arm, 5/5 in the left.      Data Reviewed:    Family Communication:     Disposition: Status is: Inpatient         Author: [name], MD 04/06/2024 3:17 PM  For on call review [email or web address]    "

## 2024-04-06 NOTE — Care Management Important Message (Signed)
 Important Message  Patient Details  Name: Larry Dawson MRN: [id number] Date of Birth: 1955-11-04   Important Message Given:  Yes - Medicare IM     Jennie Laneta Dragon 04/06/2024, 1:57 PM

## 2024-04-06 NOTE — Progress Notes (Signed)
 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  This is primarily managed by our partner Dr.Janjua. Allowing his hemorrhage to liquefy for few days will be optimal for surgery, so we will plan for surgery with Dr. Janjua in the coming 2 weeks. - Continue PTOT  Penne LELON Sharps MD Department of Neurosurgery

## 2024-04-06 NOTE — Progress Notes (Signed)
 Physical Therapy Treatment Patient Details Name: Larry Dawson MRN: [id number] DOB: [birth year] Today's Date: 04/06/2024   History of Present Illness 68 y.o. M with recent diagnosis of pontine hemorrhage who presented with worsening right sided weakness, found to have interval progression of hemorrhage. 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 Pt with improved R UE and LE strength compared to Saturday and able to maintain grip on RW with R hand this date during ambulation. Pt continues with double vision however improved with occluded glasses given by OT yesterday. Focused on R LE strengthening, sitting EOB balance, and gait progression this session. Pt continues to demonstrate R sided overall weakness, impaired balance, impaired coordination and requires modAx2 for OOB mobility. Pt to continue to benefit from aggressive inpatient rehab program > 3 hrs as pt was indep PTA and demonstrates excellent rehab potential. Aware we are still awaiting on surgical intervention. Acute PT to cont to follow.    If plan is discharge home, recommend the following: A lot of help with walking and/or transfers;A lot of help with bathing/dressing/bathroom;Assist for transportation;Help with stairs or ramp for entrance;Assistance with cooking/housework   Can travel by private vehicle        Equipment Recommendations  Rolling walker (2 wheels)    Recommendations for Other Services Rehab consult     Precautions / Restrictions Precautions Precautions: Fall Recall of Precautions/Restrictions: Intact Restrictions Weight Bearing Restrictions Per Provider Order: No     Mobility  Bed Mobility Overal bed mobility: Needs Assistance Bed Mobility: Rolling, Sidelying to Sit Rolling: Contact guard assist Sidelying to sit: Min assist       General bed mobility  comments: HOB elevated, increased time, rolled to the R, minA for trunk elevation due to limited use of R UE to push up from    Transfers Overall transfer level: Needs assistance Equipment used: Rolling walker (2 wheels) Transfers: Sit to/from Stand Sit to Stand: Min assist           General transfer comment: vc for safe hand placement, assist to keep R hand on RW minA to power up from bed, bed height elevated    Ambulation/Gait Ambulation/Gait assistance: Mod assist, +2 safety/equipment Gait Distance (Feet): 70 Feet (x1, 20x1) Assistive device: Rolling walker (2 wheels) Gait Pattern/deviations: Step-to pattern, Decreased stance time - right, Decreased step length - left, Decreased dorsiflexion - right, Scissoring, Ataxic Gait velocity: dec Gait velocity interpretation: <1.31 ft/sec, indicative of household ambulator   General Gait Details: verbal and tactile cues at hip flexors cues to pick up R foot to clear it, verbal cues to keep R foot on R side of walker to prevent scissoring, due to impaired sensation pt looking down at feet, progressive trunk flexion requiring increased assist to maintain upright posture. pt with diaphoresis however denies dizziness or pain, BP 137/89, RN aware   Stairs             Wheelchair Mobility     Tilt Bed    Modified Rankin (Stroke Patients Only) Modified Rankin (Stroke Patients Only) Pre-Morbid Rankin Score: No significant disability Modified Rankin: Moderately severe disability     Balance Overall balance assessment: Needs assistance Sitting-balance support: Single extremity supported, Feet supported Sitting balance-Leahy Scale: Fair Sitting balance - Comments: pt with tendency to lean L, able to correct with verbal cues, verbal cues to keep R UE on bed,  difficulty for pt to maintain due to impaired sensation and strength   Standing balance support: Bilateral upper extremity supported, During functional activity, Reliant on  assistive device for balance Standing balance-Leahy Scale: Poor Standing balance comment: reliant on RW and external support                            Communication Communication Communication: Impaired Factors Affecting Communication: Difficulty expressing self;Reduced clarity of speech;Hearing impaired  Cognition Arousal: Alert Behavior During Therapy: WFL for tasks assessed/performed   PT - Cognitive impairments: No apparent impairments                         Following commands: Impaired Following commands impaired: Follows one step commands with increased time (delayed response time but also HOH)    Cueing Cueing Techniques: Verbal cues, Visual cues, Tactile cues  Exercises General Exercises - Lower Extremity Quad Sets: AROM, Right, 10 reps, Supine (with 5 sec hold) Hip ABduction/ADduction: AROM, Right, 10 reps, Supine Straight Leg Raises: AROM, Right, 10 reps, Supine Other Exercises Other Exercises: bridges in bed, assist to keep R LE in hooklying position to match L    General Comments General comments (skin integrity, edema, etc.): VSS, pt with diaphoresis during amb, pt with R eye ptosis, wears L eye occluded glases to minimize double vision      Pertinent Vitals/Pain Pain Assessment Pain Assessment: No/denies pain    Home Living                          Prior Function            PT Goals (current goals can now be found in the care plan section) Acute Rehab PT Goals Patient Stated Goal: home PT Goal Formulation: With patient Time For Goal Achievement: 04/18/24 Potential to Achieve Goals: Fair Progress towards PT goals: Progressing toward goals    Frequency    Min 4X/week      PT Plan      Co-evaluation              AM-PAC PT 6 Clicks Mobility   Outcome Measure  Help needed turning from your back to your side while in a flat bed without using bedrails?: A Lot Help needed moving from lying on your back to  sitting on the side of a flat bed without using bedrails?: A Lot Help needed moving to and from a bed to a chair (including a wheelchair)?: A Lot Help needed standing up from a chair using your arms (e.g., wheelchair or bedside chair)?: A Lot Help needed to walk in hospital room?: A Lot Help needed climbing 3-5 steps with a railing? : Total 6 Click Score: 11    End of Session Equipment Utilized During Treatment: Gait belt Activity Tolerance: Patient tolerated treatment well Patient left: in chair;with call bell/phone within reach;with chair alarm set;with family/visitor present Nurse Communication: Mobility status;Patient requests pain meds (for headache) PT Visit Diagnosis: Unsteadiness on feet (R26.81);Muscle weakness (generalized) (M62.81);Difficulty in walking, not elsewhere classified (R26.2);Hemiplegia and hemiparesis Hemiplegia - Right/Left: Right Hemiplegia - dominant/non-dominant: Dominant Hemiplegia - caused by: Nontraumatic intracerebral hemorrhage     Time: 0823-0857 PT Time Calculation (min) (ACUTE ONLY): 34 min  Charges:    $Gait Training: 8-22 mins $Therapeutic Exercise: 8-22 mins PT General Charges $$ ACUTE PT VISIT: 1 Visit  Norene Ames, PT, DPT Acute Rehabilitation Services Secure chat preferred Office #: [phone]    Norene CHRISTELLA Ames 04/06/2024, 9:07 AM

## 2024-04-06 NOTE — Plan of Care (Signed)

## 2024-04-06 NOTE — Telephone Encounter (Signed)
 Patient was seen in the ER and is currently admitted.

## 2024-04-07 ENCOUNTER — Inpatient Hospital Stay (HOSPITAL_COMMUNITY)

## 2024-04-07 DIAGNOSIS — I629 Nontraumatic intracranial hemorrhage, unspecified: Secondary | ICD-10-CM | POA: Diagnosis not present

## 2024-04-07 DIAGNOSIS — I613 Nontraumatic intracerebral hemorrhage in brain stem: Secondary | ICD-10-CM | POA: Diagnosis not present

## 2024-04-07 DIAGNOSIS — Q283 Other malformations of cerebral vessels: Secondary | ICD-10-CM | POA: Diagnosis not present

## 2024-04-07 NOTE — Progress Notes (Signed)
" °  Progress Note   Patient: Larry Dawson FMW:[id number] DOB: [birth year] DOA: 04/01/2024     5 DOS: the patient was seen and examined on 04/07/2024        Brief hospital course: 68 y.o. M with obesity, HTN, DM and recent diagnosis of pontine hemorrhage who presented with worsening right sided weakness, found to have interval progression of hemorrhage.     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  More slurred in speech.  Had discussed with Dr. Janjua, this was expected, to some degree, only concerning if severe changes in vital signs.  Discussed with Dr. Claudene NSGY.  - Resume tele and continuous pulse ox    Hypertension Blood pressure normal off home medication - Hold home atenolol , chlorthalidone  - As needed hydralazine   Type 2 diabetes, well-controlled, with hyperglycemia A1c well-controlled Glucose here good - Continue SS corrections - Continue metformin  - Hold glipizide  - Continue Crestor   Class II obesity BMI 35.2, complicates care - Hold home Mounjaro   Hypokalemia Hypomagnesemia Supplemented  COPD No active symptoms             Subjective: Worsening ataxia and slurred spech and weakness tonight     Physical Exam: BP 127/82 (BP Location: Left Arm)   Pulse 85   Temp 98.1 F (36.7 C) (Oral)   Resp 16   Ht 5' 7 (1.702 m)   Wt 102.2 kg   SpO2 95%   BMI 35.29 kg/m   Adult male, sitting up in recliner, talking with friends RRR, no murmurs, no peripheral edema Respiratory rate normal, lungs clear without rales or wheezes Stable right eyelid droop, stable amblyopia, cranial nerve VIII within normal limits, motor strength 5-/5 in the right arm, 5/5 in the left.      Data Reviewed: CT head   Family Communication:     Disposition: Status is: Inpatient         Author: [name], MD 04/07/2024 7:05 PM  For on call review [email or web address]    "

## 2024-04-07 NOTE — Progress Notes (Signed)
 Physical Therapy Treatment Patient Details Name: Larry Dawson MRN: [id number] DOB: 1956-03-05 Today's Date: 04/07/2024   History of Present Illness 68 y.o. M with recent diagnosis of pontine hemorrhage who presented with worsening right sided weakness, found to have interval progression of hemorrhage. 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 Pt did require increased assist to mod/maxA for sit to stand and was only able to amb 10' this date with RW. Pt unable to move R LE with maxA, demo'd R lateral bias in sitting and standing, unable to grip walker with R hand this date, and demonstrated more diaphoresis with movement today than yesterday. RN made aware, VSS stable. Per RN MD to discuss surgery tomorrow with patient. Acute PT to cont to follow and re-assess mobiltity and d/c recommendations as medical plan is executed.     If plan is discharge home, recommend the following: A lot of help with walking and/or transfers;A lot of help with bathing/dressing/bathroom;Assist for transportation;Help with stairs or ramp for entrance;Assistance with cooking/housework   Can travel by private vehicle        Equipment Recommendations  Rolling walker (2 wheels)    Recommendations for Other Services Rehab consult     Precautions / Restrictions Precautions Precautions: Fall Recall of Precautions/Restrictions: Intact Restrictions Weight Bearing Restrictions Per Provider Order: No     Mobility  Bed Mobility Overal bed mobility: Needs Assistance Bed Mobility: Rolling, Sidelying to Sit Rolling: Contact guard assist Sidelying to sit: Min assist       General bed mobility comments: HOB elevated, increased time, rolled to the R, minA for trunk elevation due to limited use of R UE to push up from    Transfers Overall  transfer level: Needs assistance Equipment used: Rolling walker (2 wheels) Transfers: Sit to/from Stand Sit to Stand: Mod assist           General transfer comment: pt increased difficulty today compared to yesterday requiring increased assist to power up, pt unable to maintain grip on with R UE on walker this date, modA required to maintain upright standing, R lateral bias    Ambulation/Gait Ambulation/Gait assistance: +2 safety/equipment, Max assist Gait Distance (Feet): 20 Feet Assistive device: Rolling walker (2 wheels) Gait Pattern/deviations: Step-to pattern, Decreased stance time - right, Decreased step length - left, Decreased dorsiflexion - right, Scissoring, Ataxic Gait velocity: dec Gait velocity interpretation: <1.31 ft/sec, indicative of household ambulator   General Gait Details: pt with inability to clear R foot this date, significant R lateral lean, maxA to keep patient in midline, assist to keep R hand on RW   Stairs             Wheelchair Mobility     Tilt Bed    Modified Rankin (Stroke Patients Only) Modified Rankin (Stroke Patients Only) Pre-Morbid Rankin Score: No significant disability Modified Rankin: Severe disability     Balance Overall balance assessment: Needs assistance Sitting-balance support: Single extremity supported, Feet supported Sitting balance-Leahy Scale: Fair Sitting balance - Comments: pt with tendency to lean L, able to correct with verbal cues, verbal cues to keep R UE on bed, difficulty for pt to maintain due to impaired sensation and strength   Standing balance support: Bilateral upper extremity supported, During functional activity, Reliant on assistive device for balance Standing balance-Leahy Scale: Poor Standing balance comment: reliant on  RW and external support                            Communication Communication Communication: Impaired Factors Affecting Communication: Difficulty expressing  self;Reduced clarity of speech;Hearing impaired  Cognition Arousal: Alert Behavior During Therapy: WFL for tasks assessed/performed   PT - Cognitive impairments: No apparent impairments                         Following commands: Impaired Following commands impaired: Follows one step commands with increased time (delayed response time but also HOH)    Cueing Cueing Techniques: Verbal cues, Visual cues, Tactile cues  Exercises General Exercises - Lower Extremity Quad Sets: AROM, Right, 10 reps, Supine (with 5 sec hold) Other Exercises Other Exercises: R UE AROM to shld in flexion, abduction Other Exercises: AROM to R elbow    General Comments General comments (skin integrity, edema, etc.): VSS, pt diaphoretic with movement. pt reports feeling weaker today      Pertinent Vitals/Pain Pain Assessment Pain Assessment: No/denies pain    Home Living                          Prior Function            PT Goals (current goals can now be found in the care plan section) Acute Rehab PT Goals Patient Stated Goal: home PT Goal Formulation: With patient Time For Goal Achievement: 04/18/24 Potential to Achieve Goals: Fair Progress towards PT goals: Not progressing toward goals - comment (weaker today)    Frequency    Min 4X/week      PT Plan      Co-evaluation              AM-PAC PT 6 Clicks Mobility   Outcome Measure  Help needed turning from your back to your side while in a flat bed without using bedrails?: A Lot Help needed moving from lying on your back to sitting on the side of a flat bed without using bedrails?: A Lot Help needed moving to and from a bed to a chair (including a wheelchair)?: A Lot Help needed standing up from a chair using your arms (e.g., wheelchair or bedside chair)?: A Lot Help needed to walk in hospital room?: Total Help needed climbing 3-5 steps with a railing? : Total 6 Click Score: 10    End of Session  Equipment Utilized During Treatment: Gait belt Activity Tolerance: Patient limited by fatigue Patient left: in chair;with call bell/phone within reach;with chair alarm set Nurse Communication: Mobility status (regression in mobility) PT Visit Diagnosis: Unsteadiness on feet (R26.81);Muscle weakness (generalized) (M62.81);Difficulty in walking, not elsewhere classified (R26.2);Hemiplegia and hemiparesis Hemiplegia - Right/Left: Right Hemiplegia - dominant/non-dominant: Dominant Hemiplegia - caused by: Nontraumatic intracerebral hemorrhage     Time: 9155-9086 PT Time Calculation (min) (ACUTE ONLY): 29 min  Charges:    $Gait Training: 8-22 mins $Therapeutic Activity: 8-22 mins PT General Charges $$ ACUTE PT VISIT: 1 Visit                     Norene Ames, PT, DPT Acute Rehabilitation Services Secure chat preferred Office #: [phone]    Norene CHRISTELLA Ames 04/07/2024, 11:28 AM

## 2024-04-07 NOTE — Progress Notes (Signed)
 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  This is primarily managed by our partner Dr.Janjua. Allowing his hemorrhage to liquefy for few days will be optimal for surgery, so we will plan for surgery with Dr. Janjua in the coming 2 weeks. - Continue PTOT  Penne LELON Sharps MD Department of Neurosurgery

## 2024-04-07 NOTE — Plan of Care (Signed)
 " Problem: Education: Goal: Ability to describe self-care measures that may prevent or decrease complications (Diabetes Survival Skills Education) will improve 04/07/2024 0008 by Marvis Kenneth SAILOR, RN Outcome: Progressing 04/06/2024 2106 by Marvis Kenneth SAILOR, RN Outcome: Progressing Goal: Individualized Educational Video(s) 04/07/2024 0008 by Marvis Kenneth SAILOR, RN Outcome: Progressing 04/06/2024 2106 by Marvis Kenneth SAILOR, RN Outcome: Progressing   Problem: Coping: Goal: Ability to adjust to condition or change in health will improve 04/07/2024 0008 by Marvis Kenneth SAILOR, RN Outcome: Progressing 04/06/2024 2106 by Marvis Kenneth SAILOR, RN Outcome: Progressing   Problem: Fluid Volume: Goal: Ability to maintain a balanced intake and output will improve 04/07/2024 0008 by Marvis Kenneth SAILOR, RN Outcome: Progressing 04/06/2024 2106 by Marvis Kenneth SAILOR, RN Outcome: Progressing   Problem: Health Behavior/Discharge Planning: Goal: Ability to identify and utilize available resources and services will improve 04/07/2024 0008 by Marvis Kenneth SAILOR, RN Outcome: Progressing 04/06/2024 2106 by Marvis Kenneth SAILOR, RN Outcome: Progressing Goal: Ability to manage health-related needs will improve 04/07/2024 0008 by Marvis Kenneth SAILOR, RN Outcome: Progressing 04/06/2024 2106 by Marvis Kenneth SAILOR, RN Outcome: Progressing   Problem: Metabolic: Goal: Ability to maintain appropriate glucose levels will improve 04/07/2024 0008 by Marvis Kenneth SAILOR, RN Outcome: Progressing 04/06/2024 2106 by Marvis Kenneth SAILOR, RN Outcome: Progressing   Problem: Nutritional: Goal: Maintenance of adequate nutrition will improve 04/07/2024 0008 by Marvis Kenneth SAILOR, RN Outcome: Progressing 04/06/2024 2106 by Marvis Kenneth SAILOR, RN Outcome: Progressing Goal: Progress toward achieving an optimal weight will improve 04/07/2024 0008 by Marvis Kenneth SAILOR, RN Outcome: Progressing 04/06/2024 2106 by  Marvis Kenneth SAILOR, RN Outcome: Progressing   Problem: Skin Integrity: Goal: Risk for impaired skin integrity will decrease 04/07/2024 0008 by Marvis Kenneth SAILOR, RN Outcome: Progressing 04/06/2024 2106 by Marvis Kenneth SAILOR, RN Outcome: Progressing   Problem: Tissue Perfusion: Goal: Adequacy of tissue perfusion will improve 04/07/2024 0008 by Marvis Kenneth SAILOR, RN Outcome: Progressing 04/06/2024 2106 by Marvis Kenneth SAILOR, RN Outcome: Progressing   Problem: Education: Goal: Knowledge of secondary prevention will improve (MUST DOCUMENT ALL) 04/07/2024 0008 by Marvis Kenneth SAILOR, RN Outcome: Progressing 04/06/2024 2106 by Marvis Kenneth SAILOR, RN Outcome: Progressing   Problem: Spontaneous Subarachnoid Hemorrhage Tissue Perfusion: Goal: Complications of Spontaneous Subarachnoid Hemorrhage will be minimized 04/07/2024 0008 by Marvis Kenneth SAILOR, RN Outcome: Progressing 04/06/2024 2106 by Marvis Kenneth SAILOR, RN Outcome: Progressing   Problem: Health Behavior/Discharge Planning: Goal: Ability to manage health-related needs will improve 04/07/2024 0008 by Marvis Kenneth SAILOR, RN Outcome: Progressing 04/06/2024 2106 by Marvis Kenneth SAILOR, RN Outcome: Progressing Goal: Goals will be collaboratively established with patient/family 04/07/2024 0008 by Marvis Kenneth SAILOR, RN Outcome: Progressing 04/06/2024 2106 by Marvis Kenneth SAILOR, RN Outcome: Progressing   Problem: Self-Care: Goal: Ability to participate in self-care as condition permits will improve 04/07/2024 0008 by Marvis Kenneth SAILOR, RN Outcome: Progressing 04/06/2024 2106 by Marvis Kenneth SAILOR, RN Outcome: Progressing Goal: Verbalization of feelings and concerns over difficulty with self-care will improve 04/07/2024 0008 by Marvis Kenneth SAILOR, RN Outcome: Progressing 04/06/2024 2106 by Marvis Kenneth SAILOR, RN Outcome: Progressing Goal: Ability to communicate needs accurately will improve 04/07/2024 0008 by Marvis Kenneth SAILOR, RN Outcome: Progressing 04/06/2024 2106 by Marvis Kenneth SAILOR, RN Outcome: Progressing   Problem: Education: Goal: Knowledge of General Education information will improve Description: Including pain rating scale, medication(s)/side effects and non-pharmacologic comfort measures 04/07/2024 0008 by Marvis Kenneth SAILOR, RN Outcome: Progressing 04/06/2024 2106 by Marvis Kenneth SAILOR, RN Outcome: Progressing   Problem: Health  Behavior/Discharge Planning: Goal: Ability to manage health-related needs will improve 04/07/2024 0008 by Marvis Kenneth SAILOR, RN Outcome: Progressing 04/06/2024 2106 by Marvis Kenneth SAILOR, RN Outcome: Progressing   Problem: Clinical Measurements: Goal: Ability to maintain clinical measurements within normal limits will improve 04/07/2024 0008 by Marvis Kenneth SAILOR, RN Outcome: Progressing 04/06/2024 2106 by Marvis Kenneth SAILOR, RN Outcome: Progressing Goal: Will remain free from infection 04/07/2024 0008 by Marvis Kenneth SAILOR, RN Outcome: Progressing 04/06/2024 2106 by Marvis Kenneth SAILOR, RN Outcome: Progressing Goal: Diagnostic test results will improve 04/07/2024 0008 by Marvis Kenneth SAILOR, RN Outcome: Progressing 04/06/2024 2106 by Marvis Kenneth SAILOR, RN Outcome: Progressing Goal: Respiratory complications will improve 04/07/2024 0008 by Marvis Kenneth SAILOR, RN Outcome: Progressing 04/06/2024 2106 by Marvis Kenneth SAILOR, RN Outcome: Progressing Goal: Cardiovascular complication will be avoided 04/07/2024 0008 by Marvis Kenneth SAILOR, RN Outcome: Progressing 04/06/2024 2106 by Marvis Kenneth SAILOR, RN Outcome: Progressing   Problem: Activity: Goal: Risk for activity intolerance will decrease 04/07/2024 0008 by Marvis Kenneth SAILOR, RN Outcome: Progressing 04/06/2024 2106 by Marvis Kenneth SAILOR, RN Outcome: Progressing   Problem: Nutrition: Goal: Adequate nutrition will be maintained 04/07/2024 0008 by Marvis Kenneth SAILOR, RN Outcome:  Progressing 04/06/2024 2106 by Marvis Kenneth SAILOR, RN Outcome: Progressing   Problem: Coping: Goal: Level of anxiety will decrease 04/07/2024 0008 by Marvis Kenneth SAILOR, RN Outcome: Progressing 04/06/2024 2106 by Marvis Kenneth SAILOR, RN Outcome: Progressing   Problem: Elimination: Goal: Will not experience complications related to bowel motility 04/07/2024 0008 by Marvis Kenneth SAILOR, RN Outcome: Progressing 04/06/2024 2106 by Marvis Kenneth SAILOR, RN Outcome: Progressing Goal: Will not experience complications related to urinary retention 04/07/2024 0008 by Marvis Kenneth SAILOR, RN Outcome: Progressing 04/06/2024 2106 by Marvis Kenneth SAILOR, RN Outcome: Progressing   Problem: Pain Managment: Goal: General experience of comfort will improve and/or be controlled 04/07/2024 0008 by Marvis Kenneth SAILOR, RN Outcome: Progressing 04/06/2024 2106 by Marvis Kenneth SAILOR, RN Outcome: Progressing   Problem: Safety: Goal: Ability to remain free from injury will improve 04/07/2024 0008 by Marvis Kenneth SAILOR, RN Outcome: Progressing 04/06/2024 2106 by Marvis Kenneth SAILOR, RN Outcome: Progressing   Problem: Skin Integrity: Goal: Risk for impaired skin integrity will decrease 04/07/2024 0008 by Marvis Kenneth SAILOR, RN Outcome: Progressing 04/06/2024 2106 by Marvis Kenneth SAILOR, RN Outcome: Progressing   "

## 2024-04-07 NOTE — Progress Notes (Addendum)
 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  12/28 - no new events 12/29 - no new events  12/30 - no overnight events, improving PT performance noted 12/30PM - worsened examination noted with PT.   Physical Exam: Vitals:   04/07/24 0800 04/07/24 1159  BP: 115/69 133/78  Pulse: 77 81  Resp: 18 18  Temp: 98.5 F (36.9 C) 97.8 F (36.6 C)  SpO2: 94% 93%    AA Ox3 CN exam shows clear L CN VI palsy, mild facial, dysarthria Right-sided hemibody weakness appears worse on physical examination today.  It appears that this is primarily proprioceptive as when he is able to see his limb and focus on it he has at least 4 - to 4 strength in the right upper and lower extremity.  When I initially examined him and asked him to activate his limbs he had difficulty prior to being redirected to look at his limbs while moving them.  On physical examination patient lacked proprioceptive sensation with joint position manipulation.   +ataxia  Data:  Other tests/results:  Narrative & Impression  EXAM: CT HEAD WITHOUT CONTRAST 04/07/2024 03:48:57 PM   TECHNIQUE: CT of the head was performed without the administration of intravenous contrast. Automated exposure control, iterative reconstruction, and/or weight based adjustment of the mA/kV was utilized to reduce the radiation dose to as low as reasonably achievable.   COMPARISON: Head CT and MRI 04/02/2024.   CLINICAL HISTORY: Stroke, follow up; Neuro/ Motor function change.   FINDINGS:   BRAIN AND VENTRICLES: The heterogeneously hyperdense/hemorrhagic mass in the pons has mildly enlarged further, now measuring 2.8 x 2.5 cm. Mild surrounding edema is stable to mildly increased. The pons remains expanded, however there is no other significant posterior fossa mass effect. No acute supratentorial hemorrhage, acute infarct, midline shift,  hydrocephalus, or extra-axial fluid collection is identified. Patchy hypodensities in the cerebral white matter bilaterally are unchanged and nonspecific but compatible with moderately age advanced chronic small vessel ischemic disease. There is mild cerebral atrophy. Calcified atherosclerosis at the skull base.   ORBITS: No acute abnormality.   SINUSES: Progressive right sphenoid sinus opacification, likely by a combination of mucosal thickening and fluid. Clear mastoid air cells.   SOFT TISSUES AND SKULL: No acute soft tissue abnormality. No skull fracture.   IMPRESSION: 1. Mild interval enlargement of the hemorrhagic pontine mass characterized as a likely cavernoma on MRI. 2. No acute supratentorial abnormality.   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  This is primarily managed by our partner Dr.Janjua. Allowing his hemorrhage to liquefy for few days will be optimal for surgery, so we will plan for surgery with Dr. Janjua in the coming 2 weeks. - Repeat head CT demonstrated slight enlargement but no evidence of hydrocephalus.  Likely causative for his worsening proprioceptive weakness in the right upper and lower extremity.   - Continue PT/OT - Surgical discussion tomorrow with Dr. [name] and family - Updated patient and spouse on Imaging findings   Larry LELON Sharps MD Department of Neurosurgery

## 2024-04-08 ENCOUNTER — Inpatient Hospital Stay

## 2024-04-08 DIAGNOSIS — I629 Nontraumatic intracranial hemorrhage, unspecified: Secondary | ICD-10-CM | POA: Diagnosis not present

## 2024-04-08 LAB — CBC
HCT: 44.8 % (ref 39.0–52.0)
Hemoglobin: 15 g/dL (ref 13.0–17.0)
MCH: 27.8 pg (ref 26.0–34.0)
MCHC: 33.5 g/dL (ref 30.0–36.0)
MCV: 83 fL (ref 80.0–100.0)
Platelets: 271 K/uL (ref 150–400)
RBC: 5.4 MIL/uL (ref 4.22–5.81)
RDW: 12.5 % (ref 11.5–15.5)
WBC: 9.1 K/uL (ref 4.0–10.5)
nRBC: 0 % (ref 0.0–0.2)

## 2024-04-08 LAB — BASIC METABOLIC PANEL WITH GFR
Anion gap: 12 (ref 5–15)
BUN: 18 mg/dL (ref 8–23)
CO2: 24 mmol/L (ref 22–32)
Calcium: 9.3 mg/dL (ref 8.9–10.3)
Chloride: 102 mmol/L (ref 98–111)
Creatinine, Ser: 1.18 mg/dL (ref 0.61–1.24)
GFR, Estimated: >60 mL/min
Glucose, Bld: 109 mg/dL — ABNORMAL HIGH (ref 70–99)
Potassium: 3.5 mmol/L (ref 3.5–5.1)
Sodium: 138 mmol/L (ref 135–145)

## 2024-04-08 LAB — BLOOD GAS, VENOUS
Acid-Base Excess: 2.9 mmol/L — ABNORMAL HIGH (ref 0.0–2.0)
Bicarbonate: 25.9 mmol/L (ref 20.0–28.0)
O2 Saturation: 95.8 %
Patient temperature: 36.7
pCO2, Ven: 34 mmHg — ABNORMAL LOW (ref 44–60)
pH, Ven: 7.49 — ABNORMAL HIGH (ref 7.25–7.43)
pO2, Ven: 70 mmHg — ABNORMAL HIGH (ref 32–45)

## 2024-04-08 LAB — GLUCOSE, CAPILLARY: Glucose-Capillary: 103 mg/dL — ABNORMAL HIGH (ref 70–99)

## 2024-04-08 NOTE — Progress Notes (Signed)
" °   04/08/24 1557  Provider Notification  Provider Name/Title Dr. Jonel  Date Provider Notified 04/08/24  Time Provider Notified 1555  Method of Notification  (secure chat)  Notification Reason Other (Comment) (pt with no void since 0730. Bladder scan shows . Pt reports having sensation to void, but unable to void.)  Provider response See new orders  Date of Provider Response 04/08/24  Time of Provider Response 1613   Pt reports difficulty urinating. Pt reports having sensation to urinate, but unable to void. Bladder scan shows . Dr. Jonel notified. New order received to In and Out cath.  1648- In and Out cath performed by Charlies, NT and this RN. of amber colored urine drained.  "

## 2024-04-08 NOTE — Evaluation (Signed)
 Clinical/Bedside Swallow Evaluation Patient Details  Name: Larry Dawson MRN: [id number] Date of Birth: 11-06-1955  Today's Date: 04/08/2024 Time: SLP Start Time (ACUTE ONLY): 1047 SLP Stop Time (ACUTE ONLY): 1059 SLP Time Calculation (min) (ACUTE ONLY): 12 min  Past Medical History:  Past Medical History:  Diagnosis Date   Allergy    Anal fissure    COPD (chronic obstructive pulmonary disease) (HCC)    Albuterol  PRN.   Hydrocele    Hyperlipidemia    Hypertension    IBS (irritable bowel syndrome) 05/22/13   per chart note-pt denied   Past Surgical History:  Past Surgical History:  Procedure Laterality Date   HERNIA REPAIR  prior to 2013   HYDROCELE EXCISION Bilateral 11/23/2013   Procedure: HYDROCELECTOMY ADULT;  Surgeon: Alm GORMAN Fragmin, MD;  Location: [facility];  Service: Urology;  Laterality: Bilateral;   VASECTOMY  prior to 2013   HPI:  Larry Dawson has had a worsening of CN impairments during current admission. Previously evaluated with MBS this admission to ensure safety with swallowing. Pt demonstrated dealyed swallow intiaiton, but no aspiration, recommended to consume regular/thin and has tolerated well. Pt has known history of pontine lesion concerning for cavernoma.   After initial admission, pt was readmitted for worsening right upper and lower extremity weakness, double vision, slurring of his speech, and difficulty swallowing.  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 There is a plan for surgery in the coming days.    Assessment / Plan / Recommendation  Clinical Impression  Despite mild worsening of CN impairment (now with increased dysarthria, slight right facial nerve weakness) pts swallowing appears unchanged. He consumed 3 oz of water without coughing, masticated solids completely and cleared oral cavity. He needs  increased assist with self feeding due to right hand weakness and visual impairment. Pt may continue a regular diet and thin liquids. SLP will f/u after surgery to reassess. SLP Visit Diagnosis: Dysphagia, unspecified (R13.10)    Aspiration Risk  Mild aspiration risk    Diet Recommendation           Other Recommendations Oral Care Recommendations: Oral care BID     Swallow Evaluation Recommendations Recommendations: PO diet PO Diet Recommendation: Regular;Thin liquids (Level 0) Liquid Administration via: Cup;Straw Medication Administration: Whole meds with liquid Supervision: Staff to assist with self-feeding Postural changes: Position pt fully upright for meals   Assistance Recommended at Discharge    Functional Status Assessment Patient has had a recent decline in their functional status and demonstrates the ability to make significant improvements in function in a reasonable and predictable amount of time.  Frequency and Duration min 1 x/week  2 weeks       Prognosis Prognosis for improved oropharyngeal function: Good      Swallow Study   General HPI: Larry Dawson has had a worsening of CN impairments during current admission. Previously evaluated with MBS this admission to ensure safety with swallowing. Pt demonstrated dealyed swallow intiaiton, but no aspiration, recommended to consume regular/thin and has tolerated well. Pt has known history of pontine lesion concerning for cavernoma.   After initial admission, pt was readmitted for worsening right upper and lower extremity weakness, double vision, slurring of his speech, and difficulty swallowing.  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 There is a plan for surgery in the coming days. Type of Study: Bedside Swallow Evaluation Previous Swallow Assessment: MBS 12/26 - regualr/thin Diet Prior to  this Study: Regular;Thin liquids (Level 0) Temperature Spikes Noted: No Respiratory Status: Room air History of Recent Intubation: No Behavior/Cognition: Alert;Cooperative;Pleasant mood Oral Cavity Assessment: Within Functional Limits Oral Care Completed by SLP: No Oral Cavity - Dentition: Adequate natural dentition Vision: Impaired for self-feeding (needs increased assist. double vision and light sensitivity are worse) Self-Feeding Abilities: Needs assist Patient Positioning: Upright in bed Baseline Vocal Quality: Normal Volitional Cough: Strong Volitional Swallow: Unable to elicit    Oral/Motor/Sensory Function Overall Oral Motor/Sensory Function: Mild impairment Facial ROM: Reduced right;Suspected CN VII (facial) dysfunction Facial Symmetry: Abnormal symmetry right;Suspected CN VII (facial) dysfunction Facial Strength: Within Functional Limits Facial Sensation: Within Functional Limits Lingual ROM: Within Functional Limits Lingual Symmetry: Within Functional Limits Lingual Strength: Within Functional Limits Lingual Sensation: Within Functional Limits Velum: Within Functional Limits Mandible: Within Functional Limits   Ice Chips     Thin Liquid Thin Liquid: Within functional limits Presentation: Straw    Nectar Thick Nectar Thick Liquid: Not tested   Honey Thick Honey Thick Liquid: Not tested   Puree Puree: Within functional limits Presentation: Self Fed   Solid     Solid: Within functional limits      Garrell Flagg, Consuelo Fitch 04/08/2024,11:49 AM

## 2024-04-08 NOTE — Progress Notes (Signed)
" °  Progress Note   Patient: Larry Dawson FMW:[id number] DOB: 1955/05/08 DOA: 04/01/2024     6 DOS: the patient was seen and examined on 04/08/2024 at 8:43 AM      Brief hospital course: 68 y.o. M with obesity, HTN, DM and recent diagnosis of pontine hemorrhage who presented with worsening right sided weakness, found to have interval progression of hemorrhage.     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  In the last 24 hours, he has gotten subjectively weaker.  He was concerned about swallowing, but SLP today noted no change.  VBG this morning shows no hypercarbia - Continue telemetry and pulse ox - Consult neurosurgery     Hypertension Blood pressure normal off home medication - Hold home atenolol , chlorthalidone  - As needed hydralazine   Type 2 diabetes, well-controlled, with hyperglycemia A1c well-controlled Glucose here good - Continue SS corrections - Continue metformin  - Hold glipizide  - Continue Crestor   Class II obesity BMI 35.2, complicates care - Hold home Mounjaro   Hypokalemia Hypomagnesemia Supplemented  COPD No active symptoms             Subjective: Subjectively feels that he is more weak, more ataxic, and having difficulty swallowing, by which he clarifies to me that he has a globus sensation with swallowing, no coughing, no choking.     Physical Exam: BP 135/85 (BP Location: Left Arm)   Pulse 91   Temp 98.6 F (37 C) (Oral)   Resp 19   Ht 5' 7 (1.702 m)   Wt 102.2 kg   SpO2 96%   BMI 35.29 kg/m   Adult male, sitting up in bed, eating okay with his wife RRR, no murmurs, no peripheral edema Respiratory rate normal, lungs clear without rales or wheezes Face right eyelid droop appears stable, stable amblyopia, lateral eye movements are impaired, although he does not follow this command well, still with 5 -/5 strength in right arm, 5/5 in  the left, speech is noticeably more slow and halting     Data Reviewed: VBG unremarkable Basic metabolic panel and CBC normal    Family Communication: Wife at the bedside    Disposition: Status is: Inpatient         Author: [name], MD 04/08/2024 1:34 PM  For on call review [email or web address]    "

## 2024-04-08 NOTE — Progress Notes (Signed)
 Occupational Therapy Treatment Patient Details Name: Larry Dawson MRN: [id number] DOB: December 14, 1955 Today's Date: 04/08/2024   History of present illness 68 y.o. M with recent diagnosis of pontine hemorrhage who presented with worsening right sided weakness, found to have interval progression of hemorrhage. 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   OT comments  Patient supine in bed and agreeable to OT with encouragement.  Patient completing bed mobility with mod assist, transfers with max assist +2 safety and stand pivot to recliner with mod assist +2 safety.  Pt with decreased sitting and standing balance, requiring increased cues to maintain midline, difficulty managing R UE on RW, and reports increased weakness on R UE/LE today.  Educated on SROM exercises due to R proximal UE weakness. He continues to reports diplopia, but has not been wearing partial occlusion glasses- educated on wearing to tolerance to increase ability to engage in environment; pt voiced understanding.  Will continue to follow acutely, recommned >3hrs/day inpatient setting at dc.       If plan is discharge home, recommend the following:  Two people to help with walking and/or transfers;A lot of help with bathing/dressing/bathroom;Assistance with cooking/housework;Assist for transportation;Help with stairs or ramp for entrance   Equipment Recommendations  Other (comment) (defer)    Recommendations for Other Services Rehab consult;PT consult    Precautions / Restrictions Precautions Precautions: Fall Recall of Precautions/Restrictions: Intact Restrictions Weight Bearing Restrictions Per Provider Order: No       Mobility Bed Mobility Overal bed mobility: Needs Assistance Bed Mobility: Supine to Sit     Supine to sit: Mod assist     General bed mobility comments: trunk support to ascend and scooting R hip out fully to  EOB    Transfers Overall transfer level: Needs assistance Equipment used: Rolling walker (2 wheels) Transfers: Sit to/from Stand, Bed to chair/wheelchair/BSC Sit to Stand: Max assist, +2 safety/equipment Stand pivot transfers: Mod assist, +2 safety/equipment         General transfer comment: assist to power up and steady from EOB, assist to maintain R hand on RW.  Once standing requires cueing for upright posture and balance; pivoting towards L side into recliner with mod asssist +2 safety     Balance Overall balance assessment: Needs assistance Sitting-balance support: No upper extremity supported, Feet supported Sitting balance-Leahy Scale: Fair Sitting balance - Comments: tendency to lean to R and then over correct, loosing balance towards L   Standing balance support: Bilateral upper extremity supported, During functional activity, Reliant on assistive device for balance Standing balance-Leahy Scale: Poor Standing balance comment: reliant on RW and external support                           ADL either performed or assessed with clinical judgement   ADL Overall ADL's : Needs assistance/impaired                     Lower Body Dressing: Maximal assistance;+2 for safety/equipment;Sit to/from stand   Toilet Transfer: Maximal assistance;+2 for safety/equipment;Stand-pivot;Rolling walker (2 wheels) Toilet Transfer Details (indicate cue type and reason): max assist +2 to stand from EOB, pivoting towards L side with mod assist +2 safety         Functional mobility during ADLs: Moderate assistance;Rolling walker (2 wheels);Maximal assistance;Cueing for safety;Cueing for sequencing      Extremity/Trunk Assessment Upper Extremity Assessment Upper Extremity Assessment:  Right hand dominant;RUE deficits/detail RUE Deficits / Details: grossly 3-/5, difficulty raising UE at shoulder but able to grasp with hand.  When UE is lifted pt able to hold UE in flexion.   Pins/needles feeling, pt reports it feels like a porcupine. Does endorse increased weakness compared to last session RUE Sensation: decreased light touch;decreased proprioception RUE Coordination: decreased fine motor;decreased gross motor   Lower Extremity Assessment Lower Extremity Assessment: Defer to PT evaluation        Vision   Vision Assessment?: Yes Diplopia Assessment: Disappears with one eye closed;Objects split side to side;Present all the time/all directions Depth Perception: Undershoots Additional Comments: pt reports not wearing occlusion glasses, but does reports diplopia.  encouraged pt to wear glasses as tolerated, even to improve mobility and reduce dizziness due to doulbe picture   Perception     Praxis     Communication Communication Communication: Impaired Factors Affecting Communication: Difficulty expressing self;Reduced clarity of speech;Hearing impaired   Cognition Arousal: Alert Behavior During Therapy: WFL for tasks assessed/performed Cognition: Cognition impaired     Awareness: Online awareness impaired   Attention impairment (select first level of impairment): Sustained attention Executive functioning impairment (select all impairments): Initiation, Sequencing, Problem solving OT - Cognition Comments: pt with slow processing, decreased problem solving and poor attention to task                 Following commands: Impaired Following commands impaired: Follows one step commands with increased time (delayed response but also HOH)      Cueing   Cueing Techniques: Verbal cues, Visual cues, Tactile cues  Exercises Exercises: Other exercises Other Exercises Other Exercises: SROM to R UE x 5 reps 2 sets shoulder flexion to 90*, elbow flexion/extension    Shoulder Instructions       General Comments VSS, diaphoretic with mobility. REports feeling weaker and with dificulty speech    Pertinent Vitals/ Pain       Pain Assessment Pain  Assessment: No/denies pain  Home Living                                          Prior Functioning/Environment              Frequency  Min 2X/week        Progress Toward Goals  OT Goals(current goals can now be found in the care plan section)  Progress towards OT goals: Progressing toward goals  Acute Rehab OT Goals Patient Stated Goal: get better OT Goal Formulation: With patient Time For Goal Achievement: 04/19/24 Potential to Achieve Goals: Good  Plan      Co-evaluation                 AM-PAC OT 6 Clicks Daily Activity     Outcome Measure   Help from another person eating meals?: A Little Help from another person taking care of personal grooming?: A Little Help from another person toileting, which includes using toliet, bedpan, or urinal?: A Lot Help from another person bathing (including washing, rinsing, drying)?: A Lot Help from another person to put on and taking off regular upper body clothing?: A Little Help from another person to put on and taking off regular lower body clothing?: Total 6 Click Score: 14    End of Session Equipment Utilized During Treatment: Gait belt;Rolling walker (2 wheels)  OT Visit Diagnosis: Unsteadiness on feet (  R26.81);Other abnormalities of gait and mobility (R26.89);Muscle weakness (generalized) (M62.81);Low vision, both eyes (H54.2);Other symptoms and signs involving the nervous system (R29.898);Hemiplegia and hemiparesis Hemiplegia - Right/Left: Right Hemiplegia - dominant/non-dominant: Dominant Hemiplegia - caused by: Nontraumatic intracerebral hemorrhage   Activity Tolerance Patient tolerated treatment well   Patient Left in chair;with call bell/phone within reach;with chair alarm set;with family/visitor present   Nurse Communication Mobility status;Precautions        Time: 8859-8791 OT Time Calculation (min): 28 min  Charges: OT General Charges $OT Visit: 1 Visit OT Treatments $Self  Care/Home Management : 8-22 mins $Therapeutic Activity: 8-22 mins  Etta NOVAK, OT Acute Rehabilitation Services Office [phone] Secure Chat Preferred    Etta GORMAN Hope 04/08/2024, 1:47 PM

## 2024-04-09 DIAGNOSIS — I629 Nontraumatic intracranial hemorrhage, unspecified: Secondary | ICD-10-CM | POA: Diagnosis not present

## 2024-04-09 DIAGNOSIS — Q283 Other malformations of cerebral vessels: Secondary | ICD-10-CM | POA: Diagnosis not present

## 2024-04-09 DIAGNOSIS — I613 Nontraumatic intracerebral hemorrhage in brain stem: Secondary | ICD-10-CM | POA: Diagnosis not present

## 2024-04-09 MED ORDER — CHLORHEXIDINE GLUCONATE CLOTH 2 % EX PADS
6.0000 | MEDICATED_PAD | Freq: Every day | CUTANEOUS | Status: DC
  Administered 2024-04-09 – 2024-04-15 (×6): 6 via TOPICAL

## 2024-04-09 MED ORDER — ENSURE PLUS HIGH PROTEIN PO LIQD
237.0000 mL | Freq: Two times a day (BID) | ORAL | Status: DC
  Administered 2024-04-09 – 2024-04-12 (×6): 237 mL via ORAL

## 2024-04-09 NOTE — Progress Notes (Signed)
" °   04/09/24 2130  What Happened  Was fall witnessed? Yes  Who witnessed fall? Alm Emaline Dee, RN and Purcell Gosling, NT  Patients activity before fall other (comment);to/from bed, chair, or stretcher (Patient was moved from bed to pivot to bedside commode.)  Point of contact buttocks  Was patient injured? No  Provider Notification  Provider Name/Title Lavanda Horns, NP  Date Provider Notified 04/09/24  Time Provider Notified 2144  Method of Notification  (Secure Chat)  Notification Reason Fall  Date of Provider Response 04/09/24  Time of Provider Response 2159  Follow Up  Family notified Yes - comment  Time family notified 2200  Adult Fall Risk Assessment  Patient Fall Risk Level High fall risk  Adult Fall Risk Interventions  Required Bundle Interventions *See Row Information* High fall risk  Vitals  Temp 98.6 F (37 C)  Temp Source Oral  BP (!) 154/80  MAP (mmHg) 100  BP Location Left Arm  BP Method Automatic  Patient Position (if appropriate) Lying  Pulse Rate 96  Pulse Rate Source Monitor  ECG Heart Rate 95  Resp 20  Oxygen Therapy  SpO2 93 %  O2 Device Room Air  Neurological  Level of Consciousness Alert   Patient needed assistance with toileting patient refused bed pan and verbalized wanting to use bedside commode. During pivot to [facility] patient had an assisted fall while RN and NT supported patient by shoulders. Patient did not hit head and no new injuries or bruising were noted. Patient denies any pain. Provider notified of assisted fall. No new interventions at this time. Spouse notified and verbalized understanding of situation  "

## 2024-04-09 NOTE — Progress Notes (Signed)
" °  Progress Note   Patient: Larry Dawson FMW:[id number] DOB: [birth year] DOA: 04/01/2024     7 DOS: the patient was seen and examined on 04/09/2024 at 9:30 AM      Brief hospital course: 69 y.o. M with obesity, HTN, DM and recent diagnosis of pontine hemorrhage who presented with worsening right sided weakness, found to have interval progression of hemorrhage.     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  Surgery planned for next Monday.     Physical Exam: BP 135/84 (BP Location: Left Arm)   Pulse 89   Temp 98.7 F (37.1 C) (Oral)   Resp 12   Ht 5' 7 (1.702 m)   Wt 102.2 kg   SpO2 95%   BMI 35.29 kg/m   Adult male, sitting up in chair, trying to eat breakfast RRR, no murmurs Respiratory rate normal, lungs clear without rales or wheezes Facial droop unchanged, lateral eye movements impaired, diminished strength in the right, speech slowed halting, overall neurological exam unchanged from yesterday   Data Reviewed: No change    Family Communication:     Disposition: Status is: Inpatient 69 yo M with large pontine hemorrhage, remarkably well given size of hemorrhage  Suspect this is from cavernous hemangioma, and neurosurgery waiting for this to stabilize, plan for surgery next Monday        Author: [name], MD 04/09/2024 5:10 PM  For on call review  [email or web address]    "

## 2024-04-09 NOTE — Progress Notes (Signed)
 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  12/28 - no new events 12/29 - no new events  12/30 - worsened examination noted with PT. Repeat head CT completed. Slight expansion 1/1 -injury events overnight.  Patient stable otherwise.  Physical Exam: Vitals:   04/08/24 2219 04/09/24 0309  BP: 117/72 121/74  Pulse: 85 85  Resp: 13 16  Temp: 98.8 F (37.1 C) 98.5 F (36.9 C)  SpO2: 95% 94%    AA Ox3 CN exam shows clear L CN VI palsy, mild facial, dysarthria Right-sided hemibody weakness is stable from the day prior.  Continues to be slightly worse than it was on admission with more proprioceptive weakness in the right upper and lower extremity..  It appears that this is primarily proprioceptive as when he is able to see his limb and focus on it he has at least 4 - to 4 strength in the right upper and lower extremity.  +ataxia  Data:  Other tests/results:  Narrative & Impression  EXAM: CT HEAD WITHOUT CONTRAST 04/07/2024 03:48:57 PM   TECHNIQUE: CT of the head was performed without the administration of intravenous contrast. Automated exposure control, iterative reconstruction, and/or weight based adjustment of the mA/kV was utilized to reduce the radiation dose to as low as reasonably achievable.   COMPARISON: Head CT and MRI 04/02/2024.   CLINICAL HISTORY: Stroke, follow up; Neuro/ Motor function change.   FINDINGS:   BRAIN AND VENTRICLES: The heterogeneously hyperdense/hemorrhagic mass in the pons has mildly enlarged further, now measuring 2.8 x 2.5 cm. Mild surrounding edema is stable to mildly increased. The pons remains expanded, however there is no other significant posterior fossa mass effect. No acute supratentorial hemorrhage, acute infarct, midline shift, hydrocephalus, or extra-axial fluid collection is identified. Patchy hypodensities in the cerebral white  matter bilaterally are unchanged and nonspecific but compatible with moderately age advanced chronic small vessel ischemic disease. There is mild cerebral atrophy. Calcified atherosclerosis at the skull base.   ORBITS: No acute abnormality.   SINUSES: Progressive right sphenoid sinus opacification, likely by a combination of mucosal thickening and fluid. Clear mastoid air cells.   SOFT TISSUES AND SKULL: No acute soft tissue abnormality. No skull fracture.   IMPRESSION: 1. Mild interval enlargement of the hemorrhagic pontine mass characterized as a likely cavernoma on MRI. 2. No acute supratentorial abnormality.   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  This is primarily managed by our partner Dr.Janjua. Allowing his hemorrhage to liquefy for few days will be optimal for surgery, so we will plan for surgery with Dr. Janjua in the coming 2 weeks. - Repeat head CT demonstrated slight enlargement but no evidence of hydrocephalus.  Likely causative for his worsening proprioceptive weakness in the right upper and lower extremity.  Stable on follow-up this morning - Continue PT/OT  Penne LELON Sharps MD Department of Neurosurgery

## 2024-04-10 DIAGNOSIS — I629 Nontraumatic intracranial hemorrhage, unspecified: Secondary | ICD-10-CM | POA: Diagnosis not present

## 2024-04-10 DIAGNOSIS — I613 Nontraumatic intracerebral hemorrhage in brain stem: Secondary | ICD-10-CM | POA: Diagnosis not present

## 2024-04-10 NOTE — Progress Notes (Signed)
 Physical Therapy Treatment Patient Details Name: Larry Dawson MRN: [id number] DOB: [birth year] Today's Date: 04/10/2024   History of Present Illness 69 yo M adm 04/01/24 with Rt weakness with increased pontine hemorrhage consistent with cavernoma. PMHx: admission 12/8-12/10 with ICH. HTN, HLD, COPD, IBS, Hernia repair, T2DM    PT Comments  Pt pleasant and wanting to mobilize, reports knee buckling and fall with RN last evening during transfer. Pt with increased Rt weakness unable to grasp RW with Rt hand, increased ataxia of RLE with movement for transfers and gait. Pt benefits from stable surface on LUE to stand and step with therapist support on Rt side to support trunk and block knee. Will plan to attempt hemiwalker trial. Pt with progressive decline in function and continue to await surgical plan and progression. Patient will benefit from intensive inpatient follow-up therapy, >3 hours/day.  132/83, HR 90 SPO2 93% on RA    If plan is discharge home, recommend the following: A lot of help with walking and/or transfers;A lot of help with bathing/dressing/bathroom;Assist for transportation;Help with stairs or ramp for entrance;Assistance with cooking/housework   Can travel by private vehicle        Equipment Recommendations  Other (comment) (hemi-walker)    Recommendations for Other Services       Precautions / Restrictions Precautions Precautions: Fall;Other (comment) Recall of Precautions/Restrictions: Intact Precaution/Restrictions Comments: Rt weakness, ataxia, Rt hearing loss     Mobility  Bed Mobility Overal bed mobility: Needs Assistance Bed Mobility: Rolling, Sidelying to Sit Rolling: Mod assist Sidelying to sit: Mod assist       General bed mobility comments: mod assist to roll right, rise from side and scoot to EOB. Pt needing physical assist to control and move RLE and mod cues for posture and midline with posterior right LOB and pt cued to grasp foot rail with  left hand to stabilize    Transfers Overall transfer level: Needs assistance   Transfers: Sit to/from Stand Sit to Stand: Min assist, +2 physical assistance           General transfer comment: min +2 physical assist with LUE support to rise from surface with rail for bed, armrest on chair or hall rail to rise    Ambulation/Gait Ambulation/Gait assistance: +2 safety/equipment, Max assist Gait Distance (Feet): 10 Feet Assistive device: Rolling walker (2 wheels), 1 person hand held assist Gait Pattern/deviations: Step-to pattern, Decreased stance time - right, Decreased step length - left, Decreased dorsiflexion - right, Ataxic, Decreased step length - right, Narrow base of support, Trunk flexed   Gait velocity interpretation: <1.31 ft/sec, indicative of household ambulator   General Gait Details: pt with ataxic RLE with tactile and verbal cues to advance RLE and control knee extension. Pt with anterior lean throughout gait with mod cues for posture and looking up. Pt initially walked 3' with max +2 assist with RW needing assist to guard and advance RLE. Transitioned to pt at rail in hall with rail on LUE and RUE support of therapist with therapist blocking RLE to walk with close chair follow and pt able to perform 10' x 2 trials with use of rail limited by fatigue stating RLE pain and weakness   Stairs             Wheelchair Mobility     Tilt Bed    Modified Rankin (Stroke Patients Only) Modified Rankin (Stroke Patients Only) Pre-Morbid Rankin Score: No significant disability Modified Rankin: Severe disability     Balance Overall  balance assessment: Needs assistance Sitting-balance support: No upper extremity supported, Feet supported, Single extremity supported Sitting balance-Leahy Scale: Poor Sitting balance - Comments: posterior rt lean without UB support, LUE support to achive midline Postural control: Posterior lean, Right lateral lean Standing balance  support: Bilateral upper extremity supported, During functional activity, Reliant on assistive device for balance Standing balance-Leahy Scale: Poor Standing balance comment: reliant on RW and external support                            Communication Communication Communication: Impaired Factors Affecting Communication: Reduced clarity of speech;Hearing impaired  Cognition Arousal: Alert Behavior During Therapy: Flat affect   PT - Cognitive impairments: Safety/Judgement                       PT - Cognition Comments: decreased awareness of deficits, safety and LOB Following commands: Impaired Following commands impaired: Follows one step commands with increased time    Cueing Cueing Techniques: Verbal cues, Tactile cues  Exercises      General Comments        Pertinent Vitals/Pain Pain Assessment Faces Pain Scale: Hurts little more Pain Location: RLE with gait Pain Descriptors / Indicators: Aching, Numbness Pain Intervention(s): Limited activity within patient's tolerance, Monitored during session, Repositioned    Home Living                          Prior Function            PT Goals (current goals can now be found in the care plan section) Acute Rehab PT Goals PT Goal Formulation: With patient Time For Goal Achievement: 04/24/24 Progress towards PT goals: Goals downgraded-see care plan;Not progressing toward goals - comment    Frequency    Min 4X/week      PT Plan      Co-evaluation              AM-PAC PT 6 Clicks Mobility   Outcome Measure  Help needed turning from your back to your side while in a flat bed without using bedrails?: A Lot Help needed moving from lying on your back to sitting on the side of a flat bed without using bedrails?: A Lot Help needed moving to and from a bed to a chair (including a wheelchair)?: A Lot Help needed standing up from a chair using your arms (e.g., wheelchair or bedside  chair)?: Total Help needed to walk in hospital room?: Total Help needed climbing 3-5 steps with a railing? : Total 6 Click Score: 9    End of Session Equipment Utilized During Treatment: Gait belt Activity Tolerance: Patient tolerated treatment well Patient left: in chair;with call bell/phone within reach;with chair alarm set Nurse Communication: Mobility status (stand pivot with RLE blocked) PT Visit Diagnosis: Unsteadiness on feet (R26.81);Muscle weakness (generalized) (M62.81);Difficulty in walking, not elsewhere classified (R26.2);Hemiplegia and hemiparesis;Other abnormalities of gait and mobility (R26.89) Hemiplegia - Right/Left: Right Hemiplegia - dominant/non-dominant: Dominant Hemiplegia - caused by: Nontraumatic intracerebral hemorrhage     Time: 0826-0851 PT Time Calculation (min) (ACUTE ONLY): 25 min  Charges:    $Gait Training: 8-22 mins $Therapeutic Activity: 8-22 mins PT General Charges $$ ACUTE PT VISIT: 1 Visit                     Lenoard SQUIBB, PT Acute Rehabilitation Services Office: [phone]    Ismail Graziani B  Marzell Allemand 04/10/2024, 10:12 AM

## 2024-04-10 NOTE — Progress Notes (Signed)
 69 year old gentleman with an expanding lesion in the pons, suspected to be a cavernoma.  Since his admission to the hospital the hematoma has expanded.  He has slightly more weakness in his right upper extremity and dysmetria.  He has INO on the left.  I talked to him, his wife and their 3 daughters in his room today.  I showed them the MRI as well as the CT scan from beginning of the month and the more recent one.  I went over the options of doing nothing versus surgery versus radiation and he opted for the surgery.  I told him that the surgery entails taking high risk: He can end up with something as mild as facial numbness but also more significant deficits like double vision, blindness, swallowing difficulties possibly requiring a feeding tube, breathing tube, numbness, paralysis of 1 or all 4 extremities and also possibly death.  I told him that most deficits are not as severe after a while but he could definitively be debilitated.  I expressed my concern about this firmly with them repeatedly.  They said that they voiced understanding of this and wants to proceed with surgery.  I have conveyed my plan to Dr. Jonel as well.  I will get an MRI of the brain without contrast to delineate the expansion of the hematoma and the correct entry point.

## 2024-04-10 NOTE — Progress Notes (Signed)
" °   04/10/24 1152  TOC Brief Assessment  Insurance and Status Reviewed  Patient has primary care physician Yes  Home environment has been reviewed home witih wife and daughter  Prior level of function: self with some assistance from wife and daughter  Prior/Current Home Services No current home services  Social Drivers of Health Review SDOH reviewed no interventions necessary  Readmission risk has been reviewed Yes  Transition of care needs transition of care needs identified, TOC will continue to follow    Pt readmitted after discharge home on 03/18/24. Pending surgical intervention with neurosurgery. CIR following for potential admit when medically stable. Inpatient Care Management (ICM) has reviewed patient and will continue to monitor patient advancement through interdisciplinary progression rounds. If new patient transition needs arise, please place a ICM (CM/CSW) consult. "

## 2024-04-10 NOTE — Progress Notes (Signed)
" °  Progress Note   Patient: Larry Dawson FMW:[id number] DOB: 1955-05-04 DOA: 04/01/2024     8 DOS: the patient was seen and examined on 04/10/2024 at 9:19 AM      Brief hospital course: 69 y.o. M with obesity, HTN, DM and recent diagnosis of pontine hemorrhage who presented with worsening right sided weakness, found to have interval progression of hemorrhage.     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  Neurosurgery recommended resection in 1 to 2 weeks.  Returned with progressive symptoms, found to have enlargement of the hemorrhage.  Neurosurgery discussed risks of surgery with the patient, optimal timing will be next Monday, January 5.  He has had slow evolution of his right-sided weakness and dysmetria which was expected.  Thankfully he has had no catastrophic paralysis, or respiratory failure. - Continue telemetry and pulse ox - Consult neurosurgery, planning surgery on Monday 1/5     Hypertension Blood pressure normal off home medication - Hold home atenolol , chlorthalidone    Type 2 diabetes, well-controlled, with hyperglycemia A1c well-controlled Glucose normal on metformin  alone - Hold glipizide  - Continue Crestor  and metformin    Class II obesity BMI 35.2, complicates care - Hold home Mounjaro    Hypokalemia Hypomagnesemia Supplemented   COPD No active symptoms         Subjective: Stable from yesterday.  Surgery planned for next Monday.     Physical Exam: BP 121/75 (BP Location: Left Arm)   Pulse 89   Temp 98 F (36.7 C) (Oral)   Resp 19   Ht 5' 7 (1.702 m)   Wt 102.2 kg   SpO2 91%   BMI 35.29 kg/m   Adult male, sitting up in chair, trying to eat breakfast RRR, no murmurs Respiratory rate normal, lungs clear without rales or wheezes Patient has a  right facial droop, his lateral eye movements are impaired, he has diminished strength on the right arm and leg, as well as dysmetria, he is oriented x 3     Data Reviewed: [place] with neurosurgery  Family Communication:     Disposition: Status is: Inpatient 69 yo M with large pontine hemorrhage, remarkably well given size of hemorrhage  Suspect this is from cavernous hemangioma, and neurosurgery waiting for this to stabilize, plan for surgery next Monday        Author: [name], MD 04/10/2024 7:47 PM  For on call review [email or web address].    "

## 2024-04-11 ENCOUNTER — Inpatient Hospital Stay (HOSPITAL_COMMUNITY)

## 2024-04-11 DIAGNOSIS — I629 Nontraumatic intracranial hemorrhage, unspecified: Secondary | ICD-10-CM | POA: Diagnosis not present

## 2024-04-11 MED ORDER — SENNOSIDES-DOCUSATE SODIUM 8.6-50 MG PO TABS
1.0000 | ORAL_TABLET | Freq: Every day | ORAL | Status: DC
  Administered 2024-04-11 – 2024-04-12 (×2): 1 via ORAL
  Filled 2024-04-11 (×2): qty 1

## 2024-04-11 MED ORDER — POLYETHYLENE GLYCOL 3350 17 G PO PACK
17.0000 g | PACK | Freq: Every day | ORAL | Status: DC
  Administered 2024-04-11 – 2024-04-19 (×7): 17 g via ORAL
  Filled 2024-04-11 (×8): qty 1

## 2024-04-11 NOTE — Plan of Care (Signed)
 " Problem: Education: Goal: Ability to describe self-care measures that may prevent or decrease complications (Diabetes Survival Skills Education) will improve 04/11/2024 0439 by Marvis Kenneth SAILOR, RN Outcome: Progressing 04/10/2024 2119 by Marvis Kenneth SAILOR, RN Outcome: Progressing Goal: Individualized Educational Video(s) 04/11/2024 0439 by Marvis Kenneth SAILOR, RN Outcome: Progressing 04/10/2024 2119 by Marvis Kenneth SAILOR, RN Outcome: Progressing   Problem: Coping: Goal: Ability to adjust to condition or change in health will improve 04/11/2024 0439 by Marvis Kenneth SAILOR, RN Outcome: Progressing 04/10/2024 2119 by Marvis Kenneth SAILOR, RN Outcome: Progressing   Problem: Fluid Volume: Goal: Ability to maintain a balanced intake and output will improve 04/11/2024 0439 by Marvis Kenneth SAILOR, RN Outcome: Progressing 04/10/2024 2119 by Marvis Kenneth SAILOR, RN Outcome: Progressing   Problem: Health Behavior/Discharge Planning: Goal: Ability to identify and utilize available resources and services will improve 04/11/2024 0439 by Marvis Kenneth SAILOR, RN Outcome: Progressing 04/10/2024 2119 by Marvis Kenneth SAILOR, RN Outcome: Progressing Goal: Ability to manage health-related needs will improve 04/11/2024 0439 by Marvis Kenneth SAILOR, RN Outcome: Progressing 04/10/2024 2119 by Marvis Kenneth SAILOR, RN Outcome: Progressing   Problem: Metabolic: Goal: Ability to maintain appropriate glucose levels will improve 04/11/2024 0439 by Marvis Kenneth SAILOR, RN Outcome: Progressing 04/10/2024 2119 by Marvis Kenneth SAILOR, RN Outcome: Progressing   Problem: Nutritional: Goal: Maintenance of adequate nutrition will improve 04/11/2024 0439 by Marvis Kenneth SAILOR, RN Outcome: Progressing 04/10/2024 2119 by Marvis Kenneth SAILOR, RN Outcome: Progressing Goal: Progress toward achieving an optimal weight will improve 04/11/2024 0439 by Marvis Kenneth SAILOR, RN Outcome: Progressing 04/10/2024 2119 by Marvis Kenneth SAILOR, RN Outcome:  Progressing   Problem: Skin Integrity: Goal: Risk for impaired skin integrity will decrease 04/11/2024 0439 by Marvis Kenneth SAILOR, RN Outcome: Progressing 04/10/2024 2119 by Marvis Kenneth SAILOR, RN Outcome: Progressing   Problem: Tissue Perfusion: Goal: Adequacy of tissue perfusion will improve 04/11/2024 0439 by Marvis Kenneth SAILOR, RN Outcome: Progressing 04/10/2024 2119 by Marvis Kenneth SAILOR, RN Outcome: Progressing   Problem: Education: Goal: Knowledge of secondary prevention will improve (MUST DOCUMENT ALL) 04/11/2024 0439 by Marvis Kenneth SAILOR, RN Outcome: Progressing 04/10/2024 2119 by Marvis Kenneth SAILOR, RN Outcome: Progressing   Problem: Spontaneous Subarachnoid Hemorrhage Tissue Perfusion: Goal: Complications of Spontaneous Subarachnoid Hemorrhage will be minimized 04/11/2024 0439 by Marvis Kenneth SAILOR, RN Outcome: Progressing 04/10/2024 2119 by Marvis Kenneth SAILOR, RN Outcome: Progressing   Problem: Health Behavior/Discharge Planning: Goal: Ability to manage health-related needs will improve 04/11/2024 0439 by Marvis Kenneth SAILOR, RN Outcome: Progressing 04/10/2024 2119 by Marvis Kenneth SAILOR, RN Outcome: Progressing Goal: Goals will be collaboratively established with patient/family 04/11/2024 0439 by Marvis Kenneth SAILOR, RN Outcome: Progressing 04/10/2024 2119 by Marvis Kenneth SAILOR, RN Outcome: Progressing   Problem: Self-Care: Goal: Ability to participate in self-care as condition permits will improve 04/11/2024 0439 by Marvis Kenneth SAILOR, RN Outcome: Progressing 04/10/2024 2119 by Marvis Kenneth SAILOR, RN Outcome: Progressing Goal: Verbalization of feelings and concerns over difficulty with self-care will improve 04/11/2024 0439 by Marvis Kenneth SAILOR, RN Outcome: Progressing 04/10/2024 2119 by Marvis Kenneth SAILOR, RN Outcome: Progressing Goal: Ability to communicate needs accurately will improve 04/11/2024 0439 by Marvis Kenneth SAILOR, RN Outcome: Progressing 04/10/2024 2119 by Marvis Kenneth SAILOR, RN Outcome: Progressing   Problem: Education: Goal: Knowledge of General Education information will improve Description: Including pain rating scale, medication(s)/side effects and non-pharmacologic comfort measures 04/11/2024 0439 by Marvis Kenneth SAILOR, RN Outcome: Progressing 04/10/2024 2119 by Marvis Kenneth SAILOR, RN Outcome: Progressing   Problem: Health  Behavior/Discharge Planning: Goal: Ability to manage health-related needs will improve 04/11/2024 0439 by Marvis Kenneth SAILOR, RN Outcome: Progressing 04/10/2024 2119 by Marvis Kenneth SAILOR, RN Outcome: Progressing   Problem: Clinical Measurements: Goal: Ability to maintain clinical measurements within normal limits will improve 04/11/2024 0439 by Marvis Kenneth SAILOR, RN Outcome: Progressing 04/10/2024 2119 by Marvis Kenneth SAILOR, RN Outcome: Progressing Goal: Will remain free from infection 04/11/2024 0439 by Marvis Kenneth SAILOR, RN Outcome: Progressing 04/10/2024 2119 by Marvis Kenneth SAILOR, RN Outcome: Progressing Goal: Diagnostic test results will improve 04/11/2024 0439 by Marvis Kenneth SAILOR, RN Outcome: Progressing 04/10/2024 2119 by Marvis Kenneth SAILOR, RN Outcome: Progressing Goal: Respiratory complications will improve 04/11/2024 0439 by Marvis Kenneth SAILOR, RN Outcome: Progressing 04/10/2024 2119 by Marvis Kenneth SAILOR, RN Outcome: Progressing Goal: Cardiovascular complication will be avoided 04/11/2024 0439 by Marvis Kenneth SAILOR, RN Outcome: Progressing 04/10/2024 2119 by Marvis Kenneth SAILOR, RN Outcome: Progressing   Problem: Activity: Goal: Risk for activity intolerance will decrease 04/11/2024 0439 by Marvis Kenneth SAILOR, RN Outcome: Progressing 04/10/2024 2119 by Marvis Kenneth SAILOR, RN Outcome: Progressing   Problem: Nutrition: Goal: Adequate nutrition will be maintained 04/11/2024 0439 by Marvis Kenneth SAILOR, RN Outcome: Progressing 04/10/2024 2119 by Marvis Kenneth SAILOR, RN Outcome: Progressing   Problem: Coping: Goal: Level  of anxiety will decrease 04/11/2024 0439 by Marvis Kenneth SAILOR, RN Outcome: Progressing 04/10/2024 2119 by Marvis Kenneth SAILOR, RN Outcome: Progressing   Problem: Elimination: Goal: Will not experience complications related to bowel motility 04/11/2024 0439 by Marvis Kenneth SAILOR, RN Outcome: Progressing 04/10/2024 2119 by Marvis Kenneth SAILOR, RN Outcome: Progressing Goal: Will not experience complications related to urinary retention 04/11/2024 0439 by Marvis Kenneth SAILOR, RN Outcome: Progressing 04/10/2024 2119 by Marvis Kenneth SAILOR, RN Outcome: Progressing   Problem: Pain Managment: Goal: General experience of comfort will improve and/or be controlled 04/11/2024 0439 by Marvis Kenneth SAILOR, RN Outcome: Progressing 04/10/2024 2119 by Marvis Kenneth SAILOR, RN Outcome: Progressing   Problem: Safety: Goal: Ability to remain free from injury will improve 04/11/2024 0439 by Marvis Kenneth SAILOR, RN Outcome: Progressing 04/10/2024 2119 by Marvis Kenneth SAILOR, RN Outcome: Progressing   Problem: Skin Integrity: Goal: Risk for impaired skin integrity will decrease 04/11/2024 0439 by Marvis Kenneth SAILOR, RN Outcome: Progressing 04/10/2024 2119 by Marvis Kenneth SAILOR, RN Outcome: Progressing   "

## 2024-04-11 NOTE — Progress Notes (Signed)
"  Patient off unit for MRI  "

## 2024-04-11 NOTE — Progress Notes (Signed)
 Patient returned to unit after MRI.

## 2024-04-11 NOTE — Progress Notes (Signed)
 [facility]  CEEJAY KEGLEY FMW:[id number]  DOB: 06-25-1955  DOA: 04/01/2024  PCP: [name], MD  04/11/2024,1:24 PM  LOS: 9 days    Code Status:  home     From:  unclear    69 year old male known HTN HLD DM TY 2 COPD anal fissure Recent admission 9/29 tach 03/18/24----1.9 X1.8X 1.6 hypoattenuating pons lesion?  Hemorrhagic mass-CT chest 10 mm right upper lobe nodule?  Indolent adenocarcinoma-CTA neck head 12/10 was negative-patient was seen by neurosurgery Dr. [name] and stabilized for discharge with plans for CT head 8 weeks from that time critical care also was aware of the patient's discharge to follow-up the lung nodule  12/24 gradual decline unable to stand difficulty with speech diplopia intermittently and slurring-repeat CT head increased size pontine hemorrhage--- neurosurgery neurology consulted recommended MRI brain  12/25 Dr. 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 as per neurosurgery Laxative MiraLAX  as he has not had a stool in several days  DM TY 2 A1c recently 6.8 Continue metformin  500 twice daily and glipizide  held on admission CBGs are well-controlled does not need sliding scale coverage  HTN Continue hydralazine  5 every 4 as needed for blood pressure above 140, Crestor  20   Data Reviewed today: Sodium 138 potassium 3.5 BUN/creatinine 18/1.1 WBC 9.1 hemoglobin 15 platelet 271 MRI brain slight interval  increase central paracentral pontine hemorrhage 2.9 X2.6X 2.3 increased vasogenic edema no additional hemorrhage noted moderate advanced periventricular subcortical T2 densities-opacification of right sphenoid   DVT prophylaxis: Chemical prophylaxis: Full   Dispo/Global plan: Inpatient pending surgery     Subjective:   Met with patient brother sister wife and other relatives at the bedside--patient is actually sitting in chair and able to verbalize he feels weak on his right side right arm right leg and speech is a little slurred but otherwise other than the constipation he feels well He tells me he understands the high risk of the surgery he is able to recite back to me what he understood where the risks and he wishes to proceed  Objective + exam Vitals:   04/10/24 2313 04/11/24 0246 04/11/24 0757 04/11/24 1231  BP: 120/75  122/73 137/79  Pulse:   80   Resp:  17 16 12   Temp: 98.4 F (36.9 C) 98.4 F (36.9 C) 98.5 F (36.9 C) 98.4 F (36.9 C)  TempSrc: Oral Oral Oral Oral  SpO2:   96% 95%  Weight:      Height:       Filed Weights   04/02/24 0213  Weight: 102.2 kg     Examination: Dense deficit to right upper extremity 3/5 power cannot raise arm on right side above head-some slurring of speech with droop of mouth on the left-he does have good power in left upper extremity reflexes are somewhat brisk but I cannot get a good check on them as he tenses up could not do Jendrassic maneuver Tongue protrudes somewhat midline Lower extremity power is weaker on the right than  the left in terms of straight leg raise dorsal and plantarflexion but reflexes are about the same bilaterally-sensory grossly  Scheduled Meds:  Chlorhexidine  Gluconate Cloth  6 each Topical Daily   feeding supplement  237 mL Oral BID BM   metFORMIN   500 mg Oral BID WC   rosuvastatin   20 mg Oral QHS   Continuous Infusions: acetaminophen  **OR** acetaminophen , alum & mag hydroxide-simeth, hydrALAZINE , mouth  rinse  Time 47  Colen Grimes, MD  [facility]

## 2024-04-12 DIAGNOSIS — I629 Nontraumatic intracranial hemorrhage, unspecified: Secondary | ICD-10-CM | POA: Diagnosis not present

## 2024-04-12 LAB — BASIC METABOLIC PANEL WITH GFR
Anion gap: 12 (ref 5–15)
BUN: 19 mg/dL (ref 8–23)
CO2: 22 mmol/L (ref 22–32)
Calcium: 9.4 mg/dL (ref 8.9–10.3)
Chloride: 103 mmol/L (ref 98–111)
Creatinine, Ser: 1.26 mg/dL — ABNORMAL HIGH (ref 0.61–1.24)
GFR, Estimated: >60 mL/min
Glucose, Bld: 102 mg/dL — ABNORMAL HIGH (ref 70–99)
Potassium: 4.2 mmol/L (ref 3.5–5.1)
Sodium: 137 mmol/L (ref 135–145)

## 2024-04-12 LAB — CBC WITH DIFFERENTIAL/PLATELET
Abs Immature Granulocytes: 0.02 K/uL (ref 0.00–0.07)
Basophils Absolute: 0.1 K/uL (ref 0.0–0.1)
Basophils Relative: 1 %
Eosinophils Absolute: 0.1 K/uL (ref 0.0–0.5)
Eosinophils Relative: 1 %
HCT: 45.6 % (ref 39.0–52.0)
Hemoglobin: 15.2 g/dL (ref 13.0–17.0)
Immature Granulocytes: 0 %
Lymphocytes Relative: 23 %
Lymphs Abs: 1.9 K/uL (ref 0.7–4.0)
MCH: 28 pg (ref 26.0–34.0)
MCHC: 33.3 g/dL (ref 30.0–36.0)
MCV: 84 fL (ref 80.0–100.0)
Monocytes Absolute: 0.7 K/uL (ref 0.1–1.0)
Monocytes Relative: 9 %
Neutro Abs: 5.3 K/uL (ref 1.7–7.7)
Neutrophils Relative %: 66 %
Platelets: 254 K/uL (ref 150–400)
RBC: 5.43 MIL/uL (ref 4.22–5.81)
RDW: 12.6 % (ref 11.5–15.5)
WBC: 8.2 K/uL (ref 4.0–10.5)
nRBC: 0 % (ref 0.0–0.2)

## 2024-04-12 LAB — ABO/RH: ABO/RH(D): O POS

## 2024-04-12 NOTE — Plan of Care (Signed)
" °  Problem: Education: Goal: Ability to describe self-care measures that may prevent or decrease complications (Diabetes Survival Skills Education) will improve Outcome: Progressing Goal: Individualized Educational Video(s) Outcome: Progressing   Problem: Coping: Goal: Ability to adjust to condition or change in health will improve Outcome: Progressing   Problem: Fluid Volume: Goal: Ability to maintain a balanced intake and output will improve Outcome: Progressing   Problem: Health Behavior/Discharge Planning: Goal: Ability to identify and utilize available resources and services will improve Outcome: Progressing Goal: Ability to manage health-related needs will improve Outcome: Progressing   Problem: Metabolic: Goal: Ability to maintain appropriate glucose levels will improve Outcome: Progressing   Problem: Nutritional: Goal: Maintenance of adequate nutrition will improve Outcome: Progressing Goal: Progress toward achieving an optimal weight will improve Outcome: Progressing   Problem: Skin Integrity: Goal: Risk for impaired skin integrity will decrease Outcome: Progressing   Problem: Tissue Perfusion: Goal: Adequacy of tissue perfusion will improve Outcome: Progressing   Problem: Education: Goal: Knowledge of secondary prevention will improve (MUST DOCUMENT ALL) Outcome: Progressing   Problem: Spontaneous Subarachnoid Hemorrhage Tissue Perfusion: Goal: Complications of Spontaneous Subarachnoid Hemorrhage will be minimized Outcome: Progressing   Problem: Health Behavior/Discharge Planning: Goal: Ability to manage health-related needs will improve Outcome: Progressing Goal: Goals will be collaboratively established with patient/family Outcome: Progressing   Problem: Self-Care: Goal: Ability to participate in self-care as condition permits will improve Outcome: Progressing Goal: Verbalization of feelings and concerns over difficulty with self-care will  improve Outcome: Progressing Goal: Ability to communicate needs accurately will improve Outcome: Progressing   Problem: Education: Goal: Knowledge of General Education information will improve Description: Including pain rating scale, medication(s)/side effects and non-pharmacologic comfort measures Outcome: Progressing   Problem: Health Behavior/Discharge Planning: Goal: Ability to manage health-related needs will improve Outcome: Progressing   Problem: Clinical Measurements: Goal: Ability to maintain clinical measurements within normal limits will improve Outcome: Progressing Goal: Will remain free from infection Outcome: Progressing Goal: Diagnostic test results will improve Outcome: Progressing Goal: Respiratory complications will improve Outcome: Progressing Goal: Cardiovascular complication will be avoided Outcome: Progressing   Problem: Activity: Goal: Risk for activity intolerance will decrease Outcome: Progressing   Problem: Nutrition: Goal: Adequate nutrition will be maintained Outcome: Progressing   Problem: Coping: Goal: Level of anxiety will decrease Outcome: Progressing   Problem: Elimination: Goal: Will not experience complications related to bowel motility Outcome: Progressing Goal: Will not experience complications related to urinary retention Outcome: Progressing   Problem: Pain Managment: Goal: General experience of comfort will improve and/or be controlled Outcome: Progressing   Problem: Safety: Goal: Ability to remain free from injury will improve Outcome: Progressing   Problem: Skin Integrity: Goal: Risk for impaired skin integrity will decrease Outcome: Progressing   "

## 2024-04-12 NOTE — Progress Notes (Signed)
. 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

## 2024-04-12 NOTE — Progress Notes (Signed)
 [facility]  Larry Dawson FMW:[id number]  DOB: [birth year]  DOA: 04/01/2024  PCP: [name], MD  04/12/2024,1:14 PM  LOS: 10 days    Code Status:  home     From:  unclear    69 year old male known HTN HLD DM TY 2 COPD anal fissure Recent admission 9/29 tach 03/18/24----1.9 X1.8X 1.6 hypoattenuating pons lesion?  Hemorrhagic mass-CT chest 10 mm right upper lobe nodule?  Indolent adenocarcinoma-CTA neck head 12/10 was negative-patient was seen by neurosurgery Dr. [name] and stabilized for discharge with plans for CT head 8 weeks from that time critical care also was aware of the patient's discharge to follow-up the lung nodule  12/24 gradual decline unable to stand difficulty with speech diplopia intermittently and slurring-repeat CT head increased size pontine hemorrhage--- neurosurgery neurology consulted recommended MRI brain  12/25 Dr. 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 as per neurosurgery MRI being performed 1/4 is pending-he is having a little bit more weakness on his right side-he has no other focal deficits and patient/family aware that surgery is scheduled around 9:30 in the morning tomorrow-no other changes from my end  DM TY 2 A1c recently 6.8 Continue metformin  500 twice daily and glipizide  held on admission CBGs are well-controlled does not need sliding scale coverage  HTN Continue hydralazine  5 every 4 as  needed for blood pressure above 140, Crestor  20   Data Reviewed today: Sodium 137 potassium 4.2 BUN/creatinine 19/1.2 WBC 8.2 hemoglobin 15 platelet 254   DVT prophylaxis:scd   Dispo/Global plan: Inpatient pending surgery     Subjective:   Some deficits to the right leg he is a little weaker He is not able to bend his knee and having pain in the ankle Other than that no other issue  Objective + exam Vitals:   04/11/24 1231 04/11/24 1542 04/11/24 1935 04/12/24 0750  BP: 137/79 (!) 144/84  126/81  Pulse:   90 90  Resp: 12 20  17   Temp: 98.4 F (36.9 C) 98.4 F (36.9 C) 98.3 F (36.8 C) 98.4 F (36.9 C)  TempSrc: Oral Oral Oral Oral  SpO2: 95% 95% 93% 90%  Weight:      Height:       Filed Weights   04/02/24 9786  Weight: 102.2 kg     Examination: Dense deficit to right upper extremity 3/5 power cannot raise arm on right side above head-some slurring of speech with droop of mouth on the left-right lower extremity is a little weaker than prior I did not test sensory no reflexes He is coherent and able to verbalize his needs S1-S2 no murmur Chest clear  Scheduled Meds:  Chlorhexidine  Gluconate Cloth  6 each Topical Daily   feeding supplement  237 mL Oral BID BM   metFORMIN   500 mg Oral BID WC   polyethylene glycol  17 g Oral Daily   rosuvastatin   20 mg Oral QHS   senna-docusate  1 tablet Oral QHS   Continuous Infusions: acetaminophen  **OR**  acetaminophen , alum & mag hydroxide-simeth, hydrALAZINE , mouth rinse  Time 27  Colen Grimes, MD  [facility]

## 2024-04-12 NOTE — Plan of Care (Signed)
 " Problem: Education: Goal: Ability to describe self-care measures that may prevent or decrease complications (Diabetes Survival Skills Education) will improve 04/12/2024 0411 by Marvis Kenneth SAILOR, RN Outcome: Progressing 04/11/2024 2033 by Marvis Kenneth SAILOR, RN Outcome: Progressing Goal: Individualized Educational Video(s) 04/12/2024 0411 by Marvis Kenneth SAILOR, RN Outcome: Progressing 04/11/2024 2033 by Marvis Kenneth SAILOR, RN Outcome: Progressing   Problem: Coping: Goal: Ability to adjust to condition or change in health will improve 04/12/2024 0411 by Marvis Kenneth SAILOR, RN Outcome: Progressing 04/11/2024 2033 by Marvis Kenneth SAILOR, RN Outcome: Progressing   Problem: Fluid Volume: Goal: Ability to maintain a balanced intake and output will improve 04/12/2024 0411 by Marvis Kenneth SAILOR, RN Outcome: Progressing 04/11/2024 2033 by Marvis Kenneth SAILOR, RN Outcome: Progressing   Problem: Health Behavior/Discharge Planning: Goal: Ability to identify and utilize available resources and services will improve 04/12/2024 0411 by Marvis Kenneth SAILOR, RN Outcome: Progressing 04/11/2024 2033 by Marvis Kenneth SAILOR, RN Outcome: Progressing Goal: Ability to manage health-related needs will improve 04/12/2024 0411 by Marvis Kenneth SAILOR, RN Outcome: Progressing 04/11/2024 2033 by Marvis Kenneth SAILOR, RN Outcome: Progressing   Problem: Metabolic: Goal: Ability to maintain appropriate glucose levels will improve 04/12/2024 0411 by Marvis Kenneth SAILOR, RN Outcome: Progressing 04/11/2024 2033 by Marvis Kenneth SAILOR, RN Outcome: Progressing   Problem: Nutritional: Goal: Maintenance of adequate nutrition will improve 04/12/2024 0411 by Marvis Kenneth SAILOR, RN Outcome: Progressing 04/11/2024 2033 by Marvis Kenneth SAILOR, RN Outcome: Progressing Goal: Progress toward achieving an optimal weight will improve 04/12/2024 0411 by Marvis Kenneth SAILOR, RN Outcome: Progressing 04/11/2024 2033 by Marvis Kenneth SAILOR, RN Outcome:  Progressing   Problem: Skin Integrity: Goal: Risk for impaired skin integrity will decrease 04/12/2024 0411 by Marvis Kenneth SAILOR, RN Outcome: Progressing 04/11/2024 2033 by Marvis Kenneth SAILOR, RN Outcome: Progressing   Problem: Tissue Perfusion: Goal: Adequacy of tissue perfusion will improve 04/12/2024 0411 by Marvis Kenneth SAILOR, RN Outcome: Progressing 04/11/2024 2033 by Marvis Kenneth SAILOR, RN Outcome: Progressing   Problem: Education: Goal: Knowledge of secondary prevention will improve (MUST DOCUMENT ALL) 04/12/2024 0411 by Marvis Kenneth SAILOR, RN Outcome: Progressing 04/11/2024 2033 by Marvis Kenneth SAILOR, RN Outcome: Progressing   Problem: Spontaneous Subarachnoid Hemorrhage Tissue Perfusion: Goal: Complications of Spontaneous Subarachnoid Hemorrhage will be minimized 04/12/2024 0411 by Marvis Kenneth SAILOR, RN Outcome: Progressing 04/11/2024 2033 by Marvis Kenneth SAILOR, RN Outcome: Progressing   Problem: Health Behavior/Discharge Planning: Goal: Ability to manage health-related needs will improve 04/12/2024 0411 by Marvis Kenneth SAILOR, RN Outcome: Progressing 04/11/2024 2033 by Marvis Kenneth SAILOR, RN Outcome: Progressing Goal: Goals will be collaboratively established with patient/family 04/12/2024 0411 by Marvis Kenneth SAILOR, RN Outcome: Progressing 04/11/2024 2033 by Marvis Kenneth SAILOR, RN Outcome: Progressing   Problem: Self-Care: Goal: Ability to participate in self-care as condition permits will improve 04/12/2024 0411 by Marvis Kenneth SAILOR, RN Outcome: Progressing 04/11/2024 2033 by Marvis Kenneth SAILOR, RN Outcome: Progressing Goal: Verbalization of feelings and concerns over difficulty with self-care will improve 04/12/2024 0411 by Marvis Kenneth SAILOR, RN Outcome: Progressing 04/11/2024 2033 by Marvis Kenneth SAILOR, RN Outcome: Progressing Goal: Ability to communicate needs accurately will improve 04/12/2024 0411 by Marvis Kenneth SAILOR, RN Outcome: Progressing 04/11/2024 2033 by Marvis Kenneth SAILOR, RN Outcome: Progressing   Problem: Education: Goal: Knowledge of General Education information will improve Description: Including pain rating scale, medication(s)/side effects and non-pharmacologic comfort measures 04/12/2024 0411 by Marvis Kenneth SAILOR, RN Outcome: Progressing 04/11/2024 2033 by Marvis Kenneth SAILOR, RN Outcome: Progressing   Problem: Health  Behavior/Discharge Planning: Goal: Ability to manage health-related needs will improve 04/12/2024 0411 by Marvis Kenneth SAILOR, RN Outcome: Progressing 04/11/2024 2033 by Marvis Kenneth SAILOR, RN Outcome: Progressing   Problem: Clinical Measurements: Goal: Ability to maintain clinical measurements within normal limits will improve 04/12/2024 0411 by Marvis Kenneth SAILOR, RN Outcome: Progressing 04/11/2024 2033 by Marvis Kenneth SAILOR, RN Outcome: Progressing Goal: Will remain free from infection 04/12/2024 0411 by Marvis Kenneth SAILOR, RN Outcome: Progressing 04/11/2024 2033 by Marvis Kenneth SAILOR, RN Outcome: Progressing Goal: Diagnostic test results will improve 04/12/2024 0411 by Marvis Kenneth SAILOR, RN Outcome: Progressing 04/11/2024 2033 by Marvis Kenneth SAILOR, RN Outcome: Progressing Goal: Respiratory complications will improve 04/12/2024 0411 by Marvis Kenneth SAILOR, RN Outcome: Progressing 04/11/2024 2033 by Marvis Kenneth SAILOR, RN Outcome: Progressing Goal: Cardiovascular complication will be avoided 04/12/2024 0411 by Marvis Kenneth SAILOR, RN Outcome: Progressing 04/11/2024 2033 by Marvis Kenneth SAILOR, RN Outcome: Progressing   Problem: Activity: Goal: Risk for activity intolerance will decrease 04/12/2024 0411 by Marvis Kenneth SAILOR, RN Outcome: Progressing 04/11/2024 2033 by Marvis Kenneth SAILOR, RN Outcome: Progressing   Problem: Nutrition: Goal: Adequate nutrition will be maintained 04/12/2024 0411 by Marvis Kenneth SAILOR, RN Outcome: Progressing 04/11/2024 2033 by Marvis Kenneth SAILOR, RN Outcome: Progressing   Problem: Coping: Goal: Level  of anxiety will decrease 04/12/2024 0411 by Marvis Kenneth SAILOR, RN Outcome: Progressing 04/11/2024 2033 by Marvis Kenneth SAILOR, RN Outcome: Progressing   Problem: Elimination: Goal: Will not experience complications related to bowel motility 04/12/2024 0411 by Marvis Kenneth SAILOR, RN Outcome: Progressing 04/11/2024 2033 by Marvis Kenneth SAILOR, RN Outcome: Progressing Goal: Will not experience complications related to urinary retention 04/12/2024 0411 by Marvis Kenneth SAILOR, RN Outcome: Progressing 04/11/2024 2033 by Marvis Kenneth SAILOR, RN Outcome: Progressing   Problem: Pain Managment: Goal: General experience of comfort will improve and/or be controlled 04/12/2024 0411 by Marvis Kenneth SAILOR, RN Outcome: Progressing 04/11/2024 2033 by Marvis Kenneth SAILOR, RN Outcome: Progressing   Problem: Safety: Goal: Ability to remain free from injury will improve 04/12/2024 0411 by Marvis Kenneth SAILOR, RN Outcome: Progressing 04/11/2024 2033 by Marvis Kenneth SAILOR, RN Outcome: Progressing   Problem: Skin Integrity: Goal: Risk for impaired skin integrity will decrease 04/12/2024 0411 by Marvis Kenneth SAILOR, RN Outcome: Progressing 04/11/2024 2033 by Marvis Kenneth SAILOR, RN Outcome: Progressing   "

## 2024-04-13 ENCOUNTER — Other Ambulatory Visit: Payer: Self-pay

## 2024-04-13 ENCOUNTER — Inpatient Hospital Stay (HOSPITAL_COMMUNITY)

## 2024-04-13 ENCOUNTER — Encounter (HOSPITAL_COMMUNITY): Payer: Self-pay | Admitting: Internal Medicine

## 2024-04-13 ENCOUNTER — Inpatient Hospital Stay (HOSPITAL_COMMUNITY): Payer: Self-pay | Admitting: Anesthesiology

## 2024-04-13 ENCOUNTER — Encounter (HOSPITAL_COMMUNITY)
Admission: EM | Disposition: A | Discharge status: Rehabilitation Facility | Payer: Self-pay | Source: Home / Self Care | Attending: Family Medicine

## 2024-04-13 DIAGNOSIS — Z87891 Personal history of nicotine dependence: Secondary | ICD-10-CM

## 2024-04-13 DIAGNOSIS — I629 Nontraumatic intracranial hemorrhage, unspecified: Secondary | ICD-10-CM | POA: Diagnosis not present

## 2024-04-13 DIAGNOSIS — Q283 Other malformations of cerebral vessels: Secondary | ICD-10-CM

## 2024-04-13 DIAGNOSIS — E119 Type 2 diabetes mellitus without complications: Secondary | ICD-10-CM | POA: Diagnosis not present

## 2024-04-13 DIAGNOSIS — K219 Gastro-esophageal reflux disease without esophagitis: Secondary | ICD-10-CM | POA: Diagnosis not present

## 2024-04-13 DIAGNOSIS — I10 Essential (primary) hypertension: Secondary | ICD-10-CM | POA: Diagnosis not present

## 2024-04-13 HISTORY — PX: CRANIOTOMY: SHX93

## 2024-04-13 HISTORY — PX: APPLICATION OF CRANIAL NAVIGATION: SHX6578

## 2024-04-13 LAB — CBC WITH DIFFERENTIAL/PLATELET
Abs Immature Granulocytes: 0.01 K/uL (ref 0.00–0.07)
Basophils Absolute: 0.1 K/uL (ref 0.0–0.1)
Basophils Relative: 1 %
Eosinophils Absolute: 0.1 K/uL (ref 0.0–0.5)
Eosinophils Relative: 2 %
HCT: 45.4 % (ref 39.0–52.0)
Hemoglobin: 15.1 g/dL (ref 13.0–17.0)
Immature Granulocytes: 0 %
Lymphocytes Relative: 31 %
Lymphs Abs: 2.2 K/uL (ref 0.7–4.0)
MCH: 27.9 pg (ref 26.0–34.0)
MCHC: 33.3 g/dL (ref 30.0–36.0)
MCV: 83.8 fL (ref 80.0–100.0)
Monocytes Absolute: 0.7 K/uL (ref 0.1–1.0)
Monocytes Relative: 10 %
Neutro Abs: 4.2 K/uL (ref 1.7–7.7)
Neutrophils Relative %: 56 %
Platelets: 268 K/uL (ref 150–400)
RBC: 5.42 MIL/uL (ref 4.22–5.81)
RDW: 12.5 % (ref 11.5–15.5)
WBC: 7.4 K/uL (ref 4.0–10.5)
nRBC: 0 % (ref 0.0–0.2)

## 2024-04-13 LAB — BASIC METABOLIC PANEL WITH GFR
Anion gap: 11 (ref 5–15)
BUN: 20 mg/dL (ref 8–23)
CO2: 25 mmol/L (ref 22–32)
Calcium: 9.2 mg/dL (ref 8.9–10.3)
Chloride: 104 mmol/L (ref 98–111)
Creatinine, Ser: 1.24 mg/dL (ref 0.61–1.24)
GFR, Estimated: >60 mL/min
Glucose, Bld: 98 mg/dL (ref 70–99)
Potassium: 3.9 mmol/L (ref 3.5–5.1)
Sodium: 140 mmol/L (ref 135–145)

## 2024-04-13 LAB — GLUCOSE, CAPILLARY
Glucose-Capillary: 113 mg/dL — ABNORMAL HIGH (ref 70–99)
Glucose-Capillary: 117 mg/dL — ABNORMAL HIGH (ref 70–99)
Glucose-Capillary: 131 mg/dL — ABNORMAL HIGH (ref 70–99)
Glucose-Capillary: 138 mg/dL — ABNORMAL HIGH (ref 70–99)
Glucose-Capillary: 156 mg/dL — ABNORMAL HIGH (ref 70–99)
Glucose-Capillary: 168 mg/dL — ABNORMAL HIGH (ref 70–99)

## 2024-04-13 LAB — MRSA NEXT GEN BY PCR, NASAL: MRSA by PCR Next Gen: NOT DETECTED

## 2024-04-13 LAB — TYPE AND SCREEN
ABO/RH(D): O POS
Antibody Screen: NEGATIVE

## 2024-04-13 MED ORDER — CHLORHEXIDINE GLUCONATE 0.12 % MT SOLN
15.0000 mL | Freq: Once | OROMUCOSAL | Status: AC
  Administered 2024-04-13: 15 mL via OROMUCOSAL
  Filled 2024-04-13: qty 15

## 2024-04-13 MED ORDER — FENTANYL CITRATE (PF) 50 MCG/ML IJ SOSY
25.0000 ug | PREFILLED_SYRINGE | INTRAMUSCULAR | Status: DC | PRN
  Administered 2024-04-13 – 2024-04-14 (×5): 25 ug via INTRAVENOUS
  Filled 2024-04-13 (×5): qty 1

## 2024-04-13 MED ORDER — LACTATED RINGERS IV SOLN: INTRAVENOUS | Status: DC

## 2024-04-13 MED ORDER — SODIUM CHLORIDE 0.9 % IV SOLN
0.1500 ug/kg/min | INTRAVENOUS | Status: DC
  Filled 2024-04-13: qty 2000

## 2024-04-13 MED ORDER — INSULIN ASPART 100 UNIT/ML IJ SOLN
0.0000 [IU] | INTRAMUSCULAR | Status: DC
  Administered 2024-04-13: 1 [IU] via SUBCUTANEOUS
  Administered 2024-04-13 – 2024-04-14 (×4): 2 [IU] via SUBCUTANEOUS
  Administered 2024-04-14: 3 [IU] via SUBCUTANEOUS
  Filled 2024-04-13: qty 2
  Filled 2024-04-13: qty 5
  Filled 2024-04-13: qty 3
  Filled 2024-04-13 (×2): qty 2
  Filled 2024-04-13: qty 1

## 2024-04-13 MED ORDER — ONDANSETRON HCL 4 MG/2ML IJ SOLN
INTRAMUSCULAR | Status: DC | PRN
  Administered 2024-04-13: 4 mg via INTRAVENOUS

## 2024-04-13 MED ORDER — BUTALBITAL-APAP-CAFFEINE 50-325-40 MG PO TABS: 2.0000 | ORAL_TABLET | ORAL | Status: DC | PRN

## 2024-04-13 MED ORDER — ACETAMINOPHEN 10 MG/ML IV SOLN
INTRAVENOUS | Status: DC | PRN
  Administered 2024-04-13: 1000 mg via INTRAVENOUS

## 2024-04-13 MED ORDER — OXYCODONE HCL 5 MG/5ML PO SOLN: 5.0000 mg | Freq: Once | ORAL | Status: DC | PRN

## 2024-04-13 MED ORDER — ESMOLOL HCL 100 MG/10ML IV SOLN
INTRAVENOUS | Status: DC | PRN
  Administered 2024-04-13: 30 mg via INTRAVENOUS
  Administered 2024-04-13 (×2): 50 mg via INTRAVENOUS
  Administered 2024-04-13: 20 mg via INTRAVENOUS
  Administered 2024-04-13: 50 mg via INTRAVENOUS

## 2024-04-13 MED ORDER — SODIUM CHLORIDE 0.9 % IV SOLN: INTRAVENOUS | Status: DC

## 2024-04-13 MED ORDER — DEXAMETHASONE 4 MG PO TABS: 4.0000 mg | ORAL_TABLET | Freq: Four times a day (QID) | ORAL | Status: DC

## 2024-04-13 MED ORDER — DEXAMETHASONE SOD PHOSPHATE PF 10 MG/ML IJ SOLN
INTRAMUSCULAR | Status: DC | PRN
  Administered 2024-04-13: 10 mg via INTRAVENOUS

## 2024-04-13 MED ORDER — DEXAMETHASONE 4 MG PO TABS: 6.0000 mg | ORAL_TABLET | Freq: Four times a day (QID) | ORAL | Status: DC

## 2024-04-13 MED ORDER — ACETAMINOPHEN 10 MG/ML IV SOLN
INTRAVENOUS | Status: AC
  Filled 2024-04-13: qty 100

## 2024-04-13 MED ORDER — DOCUSATE SODIUM 50 MG/5ML PO LIQD
100.0000 mg | Freq: Two times a day (BID) | ORAL | Status: DC
  Administered 2024-04-14: 100 mg
  Filled 2024-04-13: qty 10

## 2024-04-13 MED ORDER — ONDANSETRON HCL 4 MG PO TABS: 4.0000 mg | ORAL_TABLET | ORAL | Status: DC | PRN

## 2024-04-13 MED ORDER — OXIDIZED CELLULOSE EX PADS
MEDICATED_PAD | CUTANEOUS | Status: DC | PRN
  Administered 2024-04-13: 1 via TOPICAL

## 2024-04-13 MED ORDER — PHENYLEPHRINE HCL-NACL 20-0.9 MG/250ML-% IV SOLN
INTRAVENOUS | Status: DC | PRN
  Administered 2024-04-13: 30 ug/min via INTRAVENOUS

## 2024-04-13 MED ORDER — INSULIN ASPART 100 UNIT/ML IJ SOLN: 0.0000 [IU] | INTRAMUSCULAR | Status: DC | PRN

## 2024-04-13 MED ORDER — SUCCINYLCHOLINE CHLORIDE 200 MG/10ML IV SOSY
PREFILLED_SYRINGE | INTRAVENOUS | Status: DC | PRN
  Administered 2024-04-13: 100 mg via INTRAVENOUS

## 2024-04-13 MED ORDER — ONDANSETRON HCL 4 MG/2ML IJ SOLN: 4.0000 mg | INTRAMUSCULAR | Status: DC | PRN

## 2024-04-13 MED ORDER — SODIUM CHLORIDE 0.9 % IV SOLN: INTRAVENOUS | Status: DC | PRN

## 2024-04-13 MED ORDER — ADHERUS DURAL SEALANT
PACK | TOPICAL | Status: DC | PRN
  Administered 2024-04-13: 1 via TOPICAL

## 2024-04-13 MED ORDER — SURGIFLO WITH THROMBIN (HEMOSTATIC MATRIX KIT) OPTIME
TOPICAL | Status: DC | PRN
  Administered 2024-04-13 (×2): 1 via TOPICAL

## 2024-04-13 MED ORDER — SODIUM CHLORIDE 0.9 % IV SOLN
0.0125 ug/kg/min | INTRAVENOUS | Status: AC
  Administered 2024-04-13: .15 ug/kg/min via INTRAVENOUS
  Filled 2024-04-13: qty 2000

## 2024-04-13 MED ORDER — SODIUM CHLORIDE 0.9 % IV SOLN
0.1500 ug/kg/min | INTRAVENOUS | Status: DC
  Filled 2024-04-13: qty 5000

## 2024-04-13 MED ORDER — FENTANYL CITRATE (PF) 100 MCG/2ML IJ SOLN
INTRAMUSCULAR | Status: AC
  Filled 2024-04-13: qty 2

## 2024-04-13 MED ORDER — ACETAMINOPHEN 650 MG RE SUPP
650.0000 mg | RECTAL | Status: DC | PRN
  Filled 2024-04-13: qty 1

## 2024-04-13 MED ORDER — VANCOMYCIN HCL 1000 MG IV SOLR
INTRAVENOUS | Status: DC | PRN
  Administered 2024-04-13: 1000 mg
  Administered 2024-04-13: 1000 mg via TOPICAL

## 2024-04-13 MED ORDER — DEXAMETHASONE SOD PHOSPHATE PF 10 MG/ML IJ SOLN
6.0000 mg | Freq: Four times a day (QID) | INTRAMUSCULAR | Status: DC
  Administered 2024-04-13 – 2024-04-14 (×3): 6 mg via INTRAVENOUS

## 2024-04-13 MED ORDER — BACITRACIN ZINC 500 UNIT/GM EX OINT
TOPICAL_OINTMENT | CUTANEOUS | Status: AC
  Filled 2024-04-13: qty 28.35

## 2024-04-13 MED ORDER — DOCUSATE SODIUM 100 MG PO CAPS: 100.0000 mg | ORAL_CAPSULE | Freq: Two times a day (BID) | ORAL | Status: DC

## 2024-04-13 MED ORDER — CEFAZOLIN SODIUM-DEXTROSE 2-4 GM/100ML-% IV SOLN
2.0000 g | Freq: Three times a day (TID) | INTRAVENOUS | Status: AC
  Administered 2024-04-13 – 2024-04-14 (×3): 2 g via INTRAVENOUS
  Filled 2024-04-13 (×3): qty 100

## 2024-04-13 MED ORDER — ESMOLOL HCL 100 MG/10ML IV SOLN
INTRAVENOUS | Status: AC
  Filled 2024-04-13: qty 10

## 2024-04-13 MED ORDER — ALBUMIN HUMAN 5 % IV SOLN: INTRAVENOUS | Status: DC | PRN

## 2024-04-13 MED ORDER — SENNOSIDES-DOCUSATE SODIUM 8.6-50 MG PO TABS: 1.0000 | ORAL_TABLET | Freq: Every day | ORAL | Status: DC

## 2024-04-13 MED ORDER — PROMETHAZINE HCL 25 MG PO TABS: 12.5000 mg | ORAL_TABLET | ORAL | Status: DC | PRN

## 2024-04-13 MED ORDER — ALUM & MAG HYDROXIDE-SIMETH 200-200-20 MG/5ML PO SUSP: 30.0000 mL | ORAL | Status: DC | PRN

## 2024-04-13 MED ORDER — DEXAMETHASONE 4 MG PO TABS: 4.0000 mg | ORAL_TABLET | Freq: Three times a day (TID) | ORAL | Status: DC

## 2024-04-13 MED ORDER — ORAL CARE MOUTH RINSE: 15.0000 mL | Freq: Once | OROMUCOSAL | Status: AC

## 2024-04-13 MED ORDER — LIDOCAINE-EPINEPHRINE 1 %-1:100000 IJ SOLN
INTRAMUSCULAR | Status: DC | PRN
  Administered 2024-04-13: 10 mL

## 2024-04-13 MED ORDER — LABETALOL HCL 5 MG/ML IV SOLN
10.0000 mg | INTRAVENOUS | Status: DC | PRN
  Administered 2024-04-13 – 2024-04-15 (×4): 20 mg via INTRAVENOUS
  Filled 2024-04-13 (×4): qty 4

## 2024-04-13 MED ORDER — ACETAMINOPHEN 10 MG/ML IV SOLN: 1000.0000 mg | Freq: Once | INTRAVENOUS | Status: DC | PRN

## 2024-04-13 MED ORDER — CLEVIDIPINE BUTYRATE 0.5 MG/ML IV EMUL
0.0000 mg/h | INTRAVENOUS | Status: DC
  Administered 2024-04-13: 1 mg/h via INTRAVENOUS
  Filled 2024-04-13: qty 50

## 2024-04-13 MED ORDER — PANTOPRAZOLE SODIUM 40 MG IV SOLR
40.0000 mg | Freq: Every day | INTRAVENOUS | Status: DC
  Administered 2024-04-13: 40 mg via INTRAVENOUS
  Filled 2024-04-13 (×2): qty 10

## 2024-04-13 MED ORDER — 0.9 % SODIUM CHLORIDE (POUR BTL) OPTIME
TOPICAL | Status: DC | PRN
  Administered 2024-04-13 (×2): 1000 mL

## 2024-04-13 MED ORDER — FENTANYL CITRATE (PF) 100 MCG/2ML IJ SOLN: 25.0000 ug | INTRAMUSCULAR | Status: DC | PRN

## 2024-04-13 MED ORDER — CEFAZOLIN SODIUM-DEXTROSE 2-4 GM/100ML-% IV SOLN
2.0000 g | Freq: Once | INTRAVENOUS | Status: AC
  Administered 2024-04-13 (×2): 2 g via INTRAVENOUS
  Filled 2024-04-13: qty 100

## 2024-04-13 MED ORDER — PROPOFOL 10 MG/ML IV BOLUS
INTRAVENOUS | Status: DC | PRN
  Administered 2024-04-13 (×3): 50 mg via INTRAVENOUS
  Administered 2024-04-13: 150 mg via INTRAVENOUS

## 2024-04-13 MED ORDER — PROPOFOL 500 MG/50ML IV EMUL
INTRAVENOUS | Status: DC | PRN
  Administered 2024-04-13: 75 ug/kg/min via INTRAVENOUS

## 2024-04-13 MED ORDER — ATENOLOL 50 MG PO TABS
50.0000 mg | ORAL_TABLET | Freq: Every day | ORAL | Status: DC
  Administered 2024-04-14: 50 mg
  Filled 2024-04-13 (×2): qty 1

## 2024-04-13 MED ORDER — ACETAMINOPHEN 325 MG PO TABS
650.0000 mg | ORAL_TABLET | ORAL | Status: DC | PRN
  Filled 2024-04-13: qty 2

## 2024-04-13 MED ORDER — VANCOMYCIN HCL 1000 MG IV SOLR
INTRAVENOUS | Status: AC
  Filled 2024-04-13: qty 40

## 2024-04-13 MED ORDER — PHENOL 1.4 % MT LIQD
1.0000 | OROMUCOSAL | Status: DC | PRN
  Administered 2024-04-13: 1 via OROMUCOSAL
  Filled 2024-04-13: qty 177

## 2024-04-13 MED ORDER — ROSUVASTATIN CALCIUM 20 MG PO TABS: 20.0000 mg | ORAL_TABLET | Freq: Every day | ORAL | Status: DC

## 2024-04-13 MED ORDER — FENTANYL CITRATE (PF) 250 MCG/5ML IJ SOLN
INTRAMUSCULAR | Status: DC | PRN
  Administered 2024-04-13 (×4): 50 ug via INTRAVENOUS

## 2024-04-13 MED ORDER — METFORMIN HCL 500 MG PO TABS
500.0000 mg | ORAL_TABLET | Freq: Two times a day (BID) | ORAL | Status: DC
  Filled 2024-04-13 (×2): qty 1

## 2024-04-13 MED ORDER — POLYVINYL ALCOHOL 1.4 % OP SOLN
1.0000 [drp] | OPHTHALMIC | Status: DC
  Administered 2024-04-13 – 2024-04-19 (×49): 1 [drp] via OPHTHALMIC
  Filled 2024-04-13: qty 15

## 2024-04-13 MED ORDER — LIDOCAINE 2% (20 MG/ML) 5 ML SYRINGE
INTRAMUSCULAR | Status: DC | PRN
  Administered 2024-04-13: 60 mg via INTRAVENOUS

## 2024-04-13 MED ORDER — OXYCODONE HCL 5 MG PO TABS: 5.0000 mg | ORAL_TABLET | Freq: Once | ORAL | Status: DC | PRN

## 2024-04-13 MED ORDER — POTASSIUM CHLORIDE IN NACL 20-0.9 MEQ/L-% IV SOLN
INTRAVENOUS | Status: DC
  Filled 2024-04-13: qty 1000

## 2024-04-13 MED ORDER — LIDOCAINE-EPINEPHRINE 1 %-1:100000 IJ SOLN
INTRAMUSCULAR | Status: AC
  Filled 2024-04-13: qty 1

## 2024-04-13 MED ORDER — DROPERIDOL 2.5 MG/ML IJ SOLN: 0.6250 mg | Freq: Once | INTRAMUSCULAR | Status: DC | PRN

## 2024-04-13 MED ORDER — BACLOFEN 10 MG PO TABS: 10.0000 mg | ORAL_TABLET | Freq: Two times a day (BID) | ORAL | Status: DC

## 2024-04-13 NOTE — Anesthesia Procedure Notes (Signed)
 Procedure Name: Intubation Date/Time: 04/13/2024 7:57 AM  Performed by: Virgil Ee, CRNAPre-anesthesia Checklist: Patient identified, Patient being monitored, Timeout performed, Emergency Drugs available and Suction available Patient Re-evaluated:Patient Re-evaluated prior to induction Oxygen Delivery Method: Circle system utilized Preoxygenation: Pre-oxygenation with 100% oxygen Induction Type: IV induction Ventilation: Mask ventilation without difficulty Laryngoscope Size: Glidescope and 4 (Elective Glidescope intubation for positioning of NIM tube) Grade View: Grade I Tube type: Oral (NIM tube) Tube size: 7.0 mm Number of attempts: 1 Airway Equipment and Method: Stylet Placement Confirmation: ETT inserted through vocal cords under direct vision, positive ETCO2 and breath sounds checked- equal and bilateral Secured at: 23 cm Tube secured with: Tape Dental Injury: Teeth and Oropharynx as per pre-operative assessment

## 2024-04-13 NOTE — Progress Notes (Signed)
 69yo gentleman with a brainstem cavernoma  Past Medical History:  Diagnosis Date   Allergy    Anal fissure    COPD (chronic obstructive pulmonary disease) (HCC)    Albuterol  PRN.   Diabetes mellitus without complication (HCC)    Hydrocele    Hyperlipidemia    Hypertension    IBS (irritable bowel syndrome) 05/22/2013   per chart note-pt denied   Past Surgical History:  Procedure Laterality Date   HERNIA REPAIR  prior to 2013   HYDROCELE EXCISION Bilateral 11/23/2013   Procedure: HYDROCELECTOMY ADULT;  Surgeon: Alm GORMAN Fragmin, MD;  Location: [facility];  Service: Urology;  Laterality: Bilateral;   VASECTOMY  prior to 2013   Social History   Socioeconomic History   Marital status: Married    Spouse name: Not on file   Number of children: Not on file   Years of education: Not on file   Highest education level: Associate degree: occupational, scientist, product/process development, or vocational program  Occupational History   Not on file  Tobacco Use   Smoking status: Former    Types: Cigarettes   Smokeless tobacco: Never  Vaping Use   Vaping status: Never Used  Substance and Sexual Activity   Alcohol  use: Not Currently    Comment: rarely   Drug use: No   Sexual activity: Yes    Partners: Female  Other Topics Concern   Not on file  Social History Narrative   Marital status: married      Employment: [employer]; drives all over.  Owns company.      Tobacco:  Quit smoking in 2005; smoked 25 years      Alcohol :  Rare beer      Drugs:  None      Exercise:  None   Daily caffeine     Social Drivers of Health   Tobacco Use: Medium Risk (04/01/2024)   Patient History    Smoking Tobacco Use: Former    Smokeless Tobacco Use: Never    Passive Exposure: Not on file  Financial Resource Strain: Low Risk (01/04/2024)   Overall Financial Resource Strain (CARDIA)    Difficulty of Paying Living Expenses: Not very hard  Food Insecurity: No Food Insecurity (04/02/2024)   Epic     Worried About Radiation Protection Practitioner of Food in the Last Year: Never true    Ran Out of Food in the Last Year: Never true  Transportation Needs: No Transportation Needs (04/02/2024)   Epic    Lack of Transportation (Medical): No    Lack of Transportation (Non-Medical): No  Physical Activity: Insufficiently Active (01/04/2024)   Exercise Vital Sign    Days of Exercise per Week: 3 days    Minutes of Exercise per Session: 40 min  Stress: No Stress Concern Present (01/04/2024)   [employer] of Occupational Health - Occupational Stress Questionnaire    Feeling of Stress: Not at all  Social Connections: Socially Integrated (04/02/2024)   Social Connection and Isolation Panel    Frequency of Communication with Friends and Family: More than three times a week    Frequency of Social Gatherings with Friends and Family: Twice a week    Attends Religious Services: More than 4 times per year    Active Member of [employer] or Organizations: Yes    Attends Banker Meetings: More than 4 times per year    Marital Status: Married  Catering Manager Violence: Not At Risk (04/02/2024)   Epic    Fear of  Current or Ex-Partner: No    Emotionally Abused: No    Physically Abused: No    Sexually Abused: No  Depression (PHQ2-9): Low Risk (05/17/2023)   Depression (PHQ2-9)    PHQ-2 Score: 0  Alcohol  Screen: Low Risk (01/04/2024)   Alcohol  Screen    Last Alcohol  Screening Score (AUDIT): 1  Housing: Low Risk (04/02/2024)   Epic    Unable to Pay for Housing in the Last Year: No    Number of Times Moved in the Last Year: 0    Homeless in the Last Year: No  Utilities: Not At Risk (04/02/2024)   Epic    Threatened with loss of utilities: No  Health Literacy: Adequate Health Literacy (05/17/2023)   B1300 Health Literacy    Frequency of need for help with medical instructions: Never   Family History  Problem Relation Age of Onset   Diabetes Father    Hypertension Father    Diabetes Brother    Hypertension  Brother    Diabetes Brother    Diabetes Brother    Allergies[1] Scheduled Meds:  [MAR Hold] Chlorhexidine  Gluconate Cloth  6 each Topical Daily   [MAR Hold] feeding supplement  237 mL Oral BID BM   [MAR Hold] metFORMIN   500 mg Oral BID WC   [MAR Hold] polyethylene glycol  17 g Oral Daily   remifentanil  (ULTIVA ) 2 mg in 100 mL normal saline (20 mcg/mL) Optime  0.0125 mcg/kg/min (Adjusted) Intravenous To OR   [MAR Hold] rosuvastatin   20 mg Oral QHS   [MAR Hold] senna-docusate  1 tablet Oral QHS   Continuous Infusions:  ceFAZolin      PRN Meds:.[MAR Hold] acetaminophen  **OR** [MAR Hold] acetaminophen , [MAR Hold] alum & mag hydroxide-simeth, [MAR Hold] hydrALAZINE , [MAR Hold] mouth rinse Vitals:   04/13/24 0300 04/13/24 0640  BP: 132/80 (!) 140/76  Pulse: 81 87  Resp: 18 18  Temp: 98.8 F (37.1 C) 98.2 F (36.8 C)  SpO2: 91% 91%   Physical Exam HENT:     Head: Normocephalic.     Nose: Nose normal.  Eyes:     Pupils: Pupils are equal, round, and reactive to light.     Comments: LEFT sided INO. PR 3+.  Cardiovascular:     Rate and Rhythm: Normal rate.  Pulmonary:     Effort: Pulmonary effort is normal.  Abdominal:     General: Abdomen is flat.  Musculoskeletal:     Cervical back: Normal range of motion.  Neurological:     Mental Status: He is alert.     Cranial Nerves: Cranial nerve deficit present.     Comments: Flat affect Raises RUE without difficulty but clearly weak 3/5  Can flex RIGHT knee and pull leg up but cannot raise leg off bed   ASSESSMENT: BRAIN STEM CAVERNOUS MALFORMATION/TUMOR  PLAN: POSTERIOR FOSSA CRANIOTOMY FOR ARTERIO VENOUS MALFORMATION     [1]  Allergies Allergen Reactions   Ace Inhibitors Swelling    Suspected angioedema   Penicillins Anaphylaxis, Hives, Rash and Other (See Comments)    Throat swells   Latex Hives, Swelling and Rash

## 2024-04-13 NOTE — Consult Note (Addendum)
 "  NAME:  Larry Dawson, MRN:  [id number], DOB:  04/23/1955, LOS: 11 ADMISSION DATE:  04/01/2024, CONSULTATION DATE:  04/13/24 REFERRING MD:  [name], CHIEF COMPLAINT:  Weakness   History of Present Illness:  69 year old man w/ hx of COPD, HTN, HLD presenting with imbalance and ill-defined feelings of numbness throughout head.  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  He arrives to ICU for further management.  Denies any complaints at present but somnolent emerging from anesthesia.  CCM to help with postop management.  Pertinent  Medical History   Past Medical History:  Diagnosis Date   Allergy    Anal fissure    COPD (chronic obstructive pulmonary disease) (HCC)    Albuterol  PRN.   Diabetes mellitus without complication (HCC)    Hydrocele    Hyperlipidemia    Hypertension    IBS (irritable bowel syndrome) 05/22/2013   per chart note-pt denied     Significant Hospital Events: Including procedures, antibiotic start and stop dates in addition to other pertinent events     Interim History / Subjective:  consult  Objective    Blood pressure (!) 159/86, pulse (!) 112, temperature 97.8 F (36.6 C), resp. rate 19, height 5' 7 (1.702 m), weight 102.2 kg, SpO2 91%.        Intake/Output Summary (Last 24 hours) at 04/13/2024 1438 Last data filed at 04/13/2024 1344 Gross per 24 hour  Intake 2700 ml  Output 1350 ml  Net 1350 ml   Filed Weights   04/02/24 0213 04/13/24 0640  Weight: 102.2 kg 102.2 kg    Examination: General: no distress HENT: eyes midline, cannot look to left but can move a bit to right, NGT in place Lungs: some transmitted upper airway sounds, weak cough Cardiovascular: tachy, ext warm Abdomen: soft, hypoactive BS Extremities: no edema Neuro: Moves L better than R GU: foley in place  Labs/imaging reviewed  Resolved problem list   Assessment and Plan   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 [name]  Sinus tach- tx as pain to start, will resume PTA beta blocker as well  DM- SSI  GERD- PTA PPI  ICU Admit Neuro checks q1h x 4 hours then q2h during day, q4h at night while asleep Standing tylenol  and decadron  x 24h Baclofen  for jaw or neck pain Fentanyl  for breakthrough pain Cleviprex  titrated for SBP 120-140 Zofran  for nausea, phenergan  if refractory Labetalol  as needed for SBP > 180 AM MBSS per surgeon request due to high risk of residual dysphagia Eye drops to left eye for suspected corneal anesthesia Monitor for signs of autonomic instability Standing bowel regimen both here and home MRI 4am Progressive mobility Drain management per NSGY Discussed plan with RN  Claudene Toribio BROCKS, MD [facility] See Amion for daytime contact information E-Link [phone] Notify [facility] first if any neurological issues with patient   Labs   CBC: Recent Labs  Lab 04/08/24 0224 04/12/24 0722 04/13/24 0147  WBC 9.1 8.2 7.4  NEUTROABS  --  5.3 4.2  HGB 15.0 15.2 15.1  HCT 44.8 45.6 45.4  MCV 83.0 84.0 83.8  PLT 271 254 268    Basic Metabolic Panel: Recent Labs  Lab 04/08/24 0224 04/12/24 0722 04/13/24 0147  NA 138 137 140  K 3.5 4.2 3.9  CL 102 103 104  CO2 24 22 25   GLUCOSE 109* 102* 98  BUN 18 19 20   CREATININE 1.18 1.26* 1.24  CALCIUM  9.3 9.4 9.2   GFR: Estimated Creatinine Clearance: 65.8 mL/min (by C-G formula based on SCr of 1.24 mg/dL). Recent Labs  Lab 04/08/24 0224 04/12/24 0722 04/13/24 0147  WBC 9.1 8.2 7.4    Liver Function Tests: No results for input(s): AST, ALT, ALKPHOS, BILITOT, PROT, ALBUMIN  in the last 168 hours. No results for input(s): LIPASE, AMYLASE in the last 168 hours. No results for input(s): AMMONIA in the last 168 hours.  ABG    Component Value Date/Time   HCO3 25.9 04/08/2024 0222   TCO2 31 12/04/2021 1139    O2SAT 95.8 04/08/2024 0222     Coagulation Profile: No results for input(s): INR, PROTIME in the last 168 hours.  Cardiac Enzymes: No results for input(s): CKTOTAL, CKMB, CKMBINDEX, TROPONINI in the last 168 hours.  HbA1C: Hemoglobin A1C  Date/Time Value Ref Range Status  01/06/2024 08:55 AM 7.2 (A) 4.0 - 5.6 % Final  10/02/2023 02:51 PM 12.1 (A) 4.0 - 5.6 % Final   Hgb A1c MFr Bld  Date/Time Value Ref Range Status  04/05/2024 02:52 AM 6.8 (H) 4.8 - 5.6 % Final    Comment:    (NOTE) Diagnosis of Diabetes The following HbA1c ranges recommended by the American Diabetes Association (ADA) may be used as an aid in the diagnosis of diabetes mellitus.  Hemoglobin             Suggested A1C NGSP%              Diagnosis  <5.7                   Non Diabetic  5.7-6.4                Pre-Diabetic  >6.4                   Diabetic  <7.0                   Glycemic control for                       adults with diabetes.    03/16/2021 09:55 AM 7.8 (H) 4.6 - 6.5 % Final    Comment:    Glycemic Control Guidelines for People with Diabetes:Non Diabetic:  <6%Goal of Therapy: <7%Additional Action Suggested:  >8%     CBG: Recent Labs  Lab 04/07/24 1712 04/13/24 0716 04/13/24 1217 04/13/24 1420  GLUCAP 103* 117* 113* 138*    Review of Systems:   Limited due to somnolence, denies sign pain/sob  Past Medical History:  He,  has a past medical history of Allergy, Anal fissure, COPD (chronic obstructive pulmonary disease) (HCC), Diabetes mellitus without complication (HCC), Hydrocele, Hyperlipidemia, Hypertension, and IBS (irritable bowel syndrome) (05/22/2013).   Surgical History:   Past Surgical History:  Procedure Laterality Date   HERNIA REPAIR  prior to 2013   HYDROCELE EXCISION Bilateral 11/23/2013   Procedure: HYDROCELECTOMY ADULT;  Surgeon: Alm GORMAN Fragmin, MD;  Location: [facility];  Service: Urology;  Laterality: Bilateral;   VASECTOMY  prior to  2013     Social History:   reports that he has quit smoking. His smoking use included cigarettes. He has never used smokeless tobacco. He reports that he does not currently use alcohol . He reports that he does not use drugs.   Family History:  His family history includes Diabetes in his  brother, brother, brother, and father; Hypertension in his brother and father.   Allergies Allergies[1]   Home Medications  Prior to Admission medications  Medication Sig Start Date End Date Taking? Authorizing Provider  Arginine 500 MG CAPS Take 1 capsule by mouth daily.   Yes [provider]  Ascorbic Acid (VITAMIN C PO) Take 1 tablet by mouth daily.   Yes [provider]  atenolol -chlorthalidone  (TENORETIC ) 50-25 MG tablet Take 1 tablet by mouth daily.   Yes [provider]  cetirizine  (ZYRTEC ) 10 MG tablet Take 1 tablet (10 mg total) by mouth daily. Patient taking differently: Take 10 mg by mouth daily as needed for allergies. 05/10/17  Yes Burky, Natalie B, NP  cholecalciferol (VITAMIN D) 1000 units tablet Take 1,000 Units by mouth daily.   Yes [provider]  glipiZIDE  (GLUCOTROL ) 5 MG tablet TAKE 1 TABLET BY MOUTH 2 TIMES A DAY BEFORE A MEAL 01/25/24  Yes Sagardia, Emil Schanz, MD  metFORMIN  (GLUCOPHAGE ) 500 MG tablet TAKE 1 TABLET BY MOUTH TWICE A DAY WITH A MEAL 01/13/24  Yes Sagardia, Emil Schanz, MD  Multiple Vitamins-Minerals (ZINC  PO) Take 1 tablet by mouth daily.   Yes [provider]  Omega-3 Fatty Acids (FISH OIL) 1000 MG CAPS Take 1 capsule by mouth daily.   Yes [provider]  omeprazole  (PRILOSEC) 20 MG capsule Take 20 mg by mouth daily.   Yes [provider]  sildenafil  (VIAGRA ) 100 MG tablet Take 0.5-1 tablets (50-100 mg total) by mouth daily as needed for erectile dysfunction. 10/04/20  Yes Sagardia, Miguel Jose, MD  tirzepatide  (MOUNJARO ) 5 MG/0.5ML Pen Inject 5 mg into the skin once a week. 01/06/24  Yes Sagardia, Emil Schanz, MD  rosuvastatin  (CRESTOR ) 20 MG tablet Take 20 mg by mouth at bedtime. Patient not taking: Reported on 04/01/2024 03/19/24   [provider]     Critical care time: N/A              [1]  Allergies Allergen Reactions   Ace Inhibitors Swelling    Suspected angioedema   Penicillins Anaphylaxis, Hives, Rash and Other (See Comments)    Throat swells   Latex Hives, Swelling and Rash   "

## 2024-04-13 NOTE — Anesthesia Procedure Notes (Signed)
 Arterial Line Insertion Start/End1/08/2024 7:00 AM, 04/13/2024 7:10 AM Performed by: Cindie Donald CROME, CRNA, CRNA  Patient location: Pre-op. Preanesthetic checklist: patient identified, IV checked, site marked, risks and benefits discussed, surgical consent, monitors and equipment checked, pre-op evaluation, timeout performed and anesthesia consent Lidocaine  1% used for infiltration radial was placed Catheter size: 20 G Hand hygiene performed  and maximum sterile barriers used   Attempts: 1 Following insertion, dressing applied and Biopatch. Post procedure assessment: normal and unchanged  Patient tolerated the procedure well with no immediate complications.

## 2024-04-13 NOTE — Anesthesia Postprocedure Evaluation (Signed)
"   Anesthesia Post Note  Patient: Larry Dawson  Procedure(s) Performed: Left Posterior Fossa Craniotomy for ARTERIO-VENOUS MALFORMATION DURAL COMPLEX (Left: Head) COMPUTER-ASSISTED NAVIGATION, FOR CRANIAL PROCEDURE     Patient location during evaluation: PACU Anesthesia Type: General Level of consciousness: awake and alert Pain management: pain level controlled Vital Signs Assessment: post-procedure vital signs reviewed and stable Respiratory status: spontaneous breathing, nonlabored ventilation, respiratory function stable and patient connected to nasal cannula oxygen Cardiovascular status: blood pressure returned to baseline and stable Postop Assessment: no apparent nausea or vomiting Anesthetic complications: no   No notable events documented.  Last Vitals:  Vitals:   04/13/24 1500 04/13/24 1515  BP: (!) 165/91 (!) 149/69  Pulse: (!) 111 (!) 124  Resp: 18 19  Temp:    SpO2: 93% 95%    Last Pain:  Vitals:   04/13/24 1415  TempSrc:   PainSc: Asleep                 [name]      "

## 2024-04-13 NOTE — Progress Notes (Signed)
 eLink Physician-Brief Progress Note Patient Name: Larry Dawson DOB: Dec 09, 1955 MRN: [id number]   Date of Service  04/13/2024  HPI/Events of Note  Unable to place NG tube today due to coiling Baclofen  and Crestor  due at 2200 MBS pending in AM  eICU Interventions  Hold enteral meds till AM     Intervention Category Minor Interventions: Routine modifications to care plan (e.g. PRN medications for pain, fever)  Kiana Hollar 04/13/2024, 8:33 PM

## 2024-04-13 NOTE — Progress Notes (Signed)
 SRN requested to examine post op pt with rt gaze. Pt with Rt gaze, no vision loss, no left sided weakness, awake and alert. Dr. [name] notified of findings. Per Neurosurgeon, no further action (ie imaging) needed at this time. RN to notify MD for any changes in condition.Continue to monitor closely.   Richardson Said RN Stroke Response

## 2024-04-13 NOTE — Progress Notes (Signed)
 Pt  to OR. Report called in to [facility]. CHG wipe performed, foley care performed.

## 2024-04-13 NOTE — Progress Notes (Signed)
 PT Cancellation Note  Patient Details Name: Larry Dawson MRN: [id number] DOB: Aug 25, 1955   Cancelled Treatment:    Reason Eval/Treat Not Completed: Patient at procedure or test/unavailable   Larry Dawson 04/13/2024, 7:10 AM Lenoard SQUIBB, PT Acute Rehabilitation Services Office: [phone]

## 2024-04-13 NOTE — Progress Notes (Signed)
 OT Cancellation Note  Patient Details Name: Larry Dawson MRN: [id number] DOB: [birth year]   Cancelled Treatment:    Reason Eval/Treat Not Completed: Patient at procedure or test/ unavailable. Will follow and see as able.   Larry NOVAK, OT Acute Rehabilitation Services Office [phone] Secure Chat Preferred    Larry Dawson Hope 04/13/2024, 7:36 AM

## 2024-04-13 NOTE — Transfer of Care (Signed)
 Immediate Anesthesia Transfer of Care Note  Patient: Larry Dawson  Procedure(s) Performed: Left Posterior Fossa Craniotomy for ARTERIO-VENOUS MALFORMATION DURAL COMPLEX (Left: Head) COMPUTER-ASSISTED NAVIGATION, FOR CRANIAL PROCEDURE  Patient Location: PACU  Anesthesia Type:General  Level of Consciousness: drowsy and responds to stimulation  Airway & Oxygen Therapy: Patient Spontanous Breathing and Patient connected to face mask oxygen  Post-op Assessment: Report given to RN and Post -op Vital signs reviewed and stable. Patient moving Left side to commands. Surgeon aware  Post vital signs: Reviewed and stable  Last Vitals:  Vitals Value Taken Time  BP 155/83 04/13/24 14:15  Temp    Pulse 104 04/13/24 14:20  Resp 19 04/13/24 14:19  SpO2 97 % 04/13/24 14:20  Vitals shown include unfiled device data.  Last Pain:  Vitals:   04/13/24 0713  TempSrc:   PainSc: 3       Patients Stated Pain Goal: 2 (04/11/24 1935)  Complications: No notable events documented.

## 2024-04-13 NOTE — Plan of Care (Signed)
 " Problem: Education: Goal: Ability to describe self-care measures that may prevent or decrease complications (Diabetes Survival Skills Education) will improve 04/13/2024 0016 by Marvis Kenneth SAILOR, RN Outcome: Progressing 04/12/2024 2054 by Marvis Kenneth SAILOR, RN Outcome: Progressing Goal: Individualized Educational Video(s) 04/13/2024 0016 by Marvis Kenneth SAILOR, RN Outcome: Progressing 04/12/2024 2054 by Marvis Kenneth SAILOR, RN Outcome: Progressing   Problem: Coping: Goal: Ability to adjust to condition or change in health will improve 04/13/2024 0016 by Marvis Kenneth SAILOR, RN Outcome: Progressing 04/12/2024 2054 by Marvis Kenneth SAILOR, RN Outcome: Progressing   Problem: Fluid Volume: Goal: Ability to maintain a balanced intake and output will improve 04/13/2024 0016 by Marvis Kenneth SAILOR, RN Outcome: Progressing 04/12/2024 2054 by Marvis Kenneth SAILOR, RN Outcome: Progressing   Problem: Health Behavior/Discharge Planning: Goal: Ability to identify and utilize available resources and services will improve 04/13/2024 0016 by Marvis Kenneth SAILOR, RN Outcome: Progressing 04/12/2024 2054 by Marvis Kenneth SAILOR, RN Outcome: Progressing Goal: Ability to manage health-related needs will improve 04/13/2024 0016 by Marvis Kenneth SAILOR, RN Outcome: Progressing 04/12/2024 2054 by Marvis Kenneth SAILOR, RN Outcome: Progressing   Problem: Metabolic: Goal: Ability to maintain appropriate glucose levels will improve 04/13/2024 0016 by Marvis Kenneth SAILOR, RN Outcome: Progressing 04/12/2024 2054 by Marvis Kenneth SAILOR, RN Outcome: Progressing   Problem: Nutritional: Goal: Maintenance of adequate nutrition will improve 04/13/2024 0016 by Marvis Kenneth SAILOR, RN Outcome: Progressing 04/12/2024 2054 by Marvis Kenneth SAILOR, RN Outcome: Progressing Goal: Progress toward achieving an optimal weight will improve 04/13/2024 0016 by Marvis Kenneth SAILOR, RN Outcome: Progressing 04/12/2024 2054 by Marvis Kenneth SAILOR, RN Outcome:  Progressing   Problem: Skin Integrity: Goal: Risk for impaired skin integrity will decrease 04/13/2024 0016 by Marvis Kenneth SAILOR, RN Outcome: Progressing 04/12/2024 2054 by Marvis Kenneth SAILOR, RN Outcome: Progressing   Problem: Tissue Perfusion: Goal: Adequacy of tissue perfusion will improve 04/13/2024 0016 by Marvis Kenneth SAILOR, RN Outcome: Progressing 04/12/2024 2054 by Marvis Kenneth SAILOR, RN Outcome: Progressing   Problem: Education: Goal: Knowledge of secondary prevention will improve (MUST DOCUMENT ALL) 04/13/2024 0016 by Marvis Kenneth SAILOR, RN Outcome: Progressing 04/12/2024 2054 by Marvis Kenneth SAILOR, RN Outcome: Progressing   Problem: Spontaneous Subarachnoid Hemorrhage Tissue Perfusion: Goal: Complications of Spontaneous Subarachnoid Hemorrhage will be minimized 04/13/2024 0016 by Marvis Kenneth SAILOR, RN Outcome: Progressing 04/12/2024 2054 by Marvis Kenneth SAILOR, RN Outcome: Progressing   Problem: Health Behavior/Discharge Planning: Goal: Ability to manage health-related needs will improve 04/13/2024 0016 by Marvis Kenneth SAILOR, RN Outcome: Progressing 04/12/2024 2054 by Marvis Kenneth SAILOR, RN Outcome: Progressing Goal: Goals will be collaboratively established with patient/family 04/13/2024 0016 by Marvis Kenneth SAILOR, RN Outcome: Progressing 04/12/2024 2054 by Marvis Kenneth SAILOR, RN Outcome: Progressing   Problem: Self-Care: Goal: Ability to participate in self-care as condition permits will improve 04/13/2024 0016 by Marvis Kenneth SAILOR, RN Outcome: Progressing 04/12/2024 2054 by Marvis Kenneth SAILOR, RN Outcome: Progressing Goal: Verbalization of feelings and concerns over difficulty with self-care will improve 04/13/2024 0016 by Marvis Kenneth SAILOR, RN Outcome: Progressing 04/12/2024 2054 by Marvis Kenneth SAILOR, RN Outcome: Progressing Goal: Ability to communicate needs accurately will improve 04/13/2024 0016 by Marvis Kenneth SAILOR, RN Outcome: Progressing 04/12/2024 2054 by Marvis Kenneth SAILOR, RN Outcome: Progressing   Problem: Education: Goal: Knowledge of General Education information will improve Description: Including pain rating scale, medication(s)/side effects and non-pharmacologic comfort measures 04/13/2024 0016 by Marvis Kenneth SAILOR, RN Outcome: Progressing 04/12/2024 2054 by Marvis Kenneth SAILOR, RN Outcome: Progressing   Problem: Health  Behavior/Discharge Planning: Goal: Ability to manage health-related needs will improve 04/13/2024 0016 by Marvis Kenneth SAILOR, RN Outcome: Progressing 04/12/2024 2054 by Marvis Kenneth SAILOR, RN Outcome: Progressing   Problem: Clinical Measurements: Goal: Ability to maintain clinical measurements within normal limits will improve 04/13/2024 0016 by Marvis Kenneth SAILOR, RN Outcome: Progressing 04/12/2024 2054 by Marvis Kenneth SAILOR, RN Outcome: Progressing Goal: Will remain free from infection 04/13/2024 0016 by Marvis Kenneth SAILOR, RN Outcome: Progressing 04/12/2024 2054 by Marvis Kenneth SAILOR, RN Outcome: Progressing Goal: Diagnostic test results will improve 04/13/2024 0016 by Marvis Kenneth SAILOR, RN Outcome: Progressing 04/12/2024 2054 by Marvis Kenneth SAILOR, RN Outcome: Progressing Goal: Respiratory complications will improve 04/13/2024 0016 by Marvis Kenneth SAILOR, RN Outcome: Progressing 04/12/2024 2054 by Marvis Kenneth SAILOR, RN Outcome: Progressing Goal: Cardiovascular complication will be avoided 04/13/2024 0016 by Marvis Kenneth SAILOR, RN Outcome: Progressing 04/12/2024 2054 by Marvis Kenneth SAILOR, RN Outcome: Progressing   Problem: Activity: Goal: Risk for activity intolerance will decrease 04/13/2024 0016 by Marvis Kenneth SAILOR, RN Outcome: Progressing 04/12/2024 2054 by Marvis Kenneth SAILOR, RN Outcome: Progressing   Problem: Nutrition: Goal: Adequate nutrition will be maintained 04/13/2024 0016 by Marvis Kenneth SAILOR, RN Outcome: Progressing 04/12/2024 2054 by Marvis Kenneth SAILOR, RN Outcome: Progressing   Problem: Coping: Goal: Level  of anxiety will decrease 04/13/2024 0016 by Marvis Kenneth SAILOR, RN Outcome: Progressing 04/12/2024 2054 by Marvis Kenneth SAILOR, RN Outcome: Progressing   Problem: Elimination: Goal: Will not experience complications related to bowel motility 04/13/2024 0016 by Marvis Kenneth SAILOR, RN Outcome: Progressing 04/12/2024 2054 by Marvis Kenneth SAILOR, RN Outcome: Progressing Goal: Will not experience complications related to urinary retention 04/13/2024 0016 by Marvis Kenneth SAILOR, RN Outcome: Progressing 04/12/2024 2054 by Marvis Kenneth SAILOR, RN Outcome: Progressing   Problem: Pain Managment: Goal: General experience of comfort will improve and/or be controlled 04/13/2024 0016 by Marvis Kenneth SAILOR, RN Outcome: Progressing 04/12/2024 2054 by Marvis Kenneth SAILOR, RN Outcome: Progressing   Problem: Safety: Goal: Ability to remain free from injury will improve 04/13/2024 0016 by Marvis Kenneth SAILOR, RN Outcome: Progressing 04/12/2024 2054 by Marvis Kenneth SAILOR, RN Outcome: Progressing   Problem: Skin Integrity: Goal: Risk for impaired skin integrity will decrease 04/13/2024 0016 by Marvis Kenneth SAILOR, RN Outcome: Progressing 04/12/2024 2054 by Marvis Kenneth SAILOR, RN Outcome: Progressing   "

## 2024-04-13 NOTE — Plan of Care (Signed)
" °  Problem: Nutritional: Goal: Maintenance of adequate nutrition will improve Outcome: Progressing   Problem: Skin Integrity: Goal: Risk for impaired skin integrity will decrease Outcome: Progressing   Problem: Tissue Perfusion: Goal: Adequacy of tissue perfusion will improve Outcome: Progressing   Problem: Health Behavior/Discharge Planning: Goal: Goals will be collaboratively established with patient/family Outcome: Progressing   Problem: Self-Care: Goal: Ability to communicate needs accurately will improve Outcome: Progressing   "

## 2024-04-13 NOTE — Anesthesia Preprocedure Evaluation (Addendum)
"                                    Anesthesia Evaluation  Patient identified by MRN, date of birth, ID band Patient awake    Reviewed: Allergy & Precautions, NPO status , Patient's Chart, lab work & pertinent test results  Airway Mallampati: III  TM Distance: >3 FB Neck ROM: Full    Dental  (+) Teeth Intact, Dental Advisory Given   Pulmonary sleep apnea , COPD, former smoker   breath sounds clear to auscultation       Cardiovascular hypertension, Pt. on home beta blockers  Rhythm:Regular Rate:Normal     Neuro/Psych negative neurological ROS  negative psych ROS   GI/Hepatic Neg liver ROS,GERD  Medicated,,  Endo/Other  diabetes, Type 2, Oral Hypoglycemic Agents    Renal/GU Renal disease     Musculoskeletal negative musculoskeletal ROS (+)    Abdominal   Peds  Hematology negative hematology ROS (+)   Anesthesia Other Findings   Reproductive/Obstetrics                              Anesthesia Physical Anesthesia Plan  ASA: 3  Anesthesia Plan: General   Post-op Pain Management: Tylenol  PO (pre-op)*   Induction: Intravenous  PONV Risk Score and Plan: 3 and Ondansetron , Dexamethasone  and Midazolam   Airway Management Planned: Oral ETT  Additional Equipment: Arterial line  Intra-op Plan:   Post-operative Plan: Possible Post-op intubation/ventilation  Informed Consent: I have reviewed the patients History and Physical, chart, labs and discussed the procedure including the risks, benefits and alternatives for the proposed anesthesia with the patient or authorized representative who has indicated his/her understanding and acceptance.       Plan Discussed with: CRNA  Anesthesia Plan Comments:          Anesthesia Quick Evaluation  "

## 2024-04-14 ENCOUNTER — Encounter (HOSPITAL_COMMUNITY): Payer: Self-pay | Admitting: Neurosurgery

## 2024-04-14 ENCOUNTER — Inpatient Hospital Stay (HOSPITAL_COMMUNITY)

## 2024-04-14 DIAGNOSIS — E119 Type 2 diabetes mellitus without complications: Secondary | ICD-10-CM | POA: Diagnosis not present

## 2024-04-14 DIAGNOSIS — K219 Gastro-esophageal reflux disease without esophagitis: Secondary | ICD-10-CM | POA: Diagnosis not present

## 2024-04-14 DIAGNOSIS — I629 Nontraumatic intracranial hemorrhage, unspecified: Secondary | ICD-10-CM | POA: Diagnosis not present

## 2024-04-14 LAB — CBC WITH DIFFERENTIAL/PLATELET
Abs Immature Granulocytes: 0.09 K/uL — ABNORMAL HIGH (ref 0.00–0.07)
Basophils Absolute: 0 K/uL (ref 0.0–0.1)
Basophils Relative: 0 %
Eosinophils Absolute: 0 K/uL (ref 0.0–0.5)
Eosinophils Relative: 0 %
HCT: 40.3 % (ref 39.0–52.0)
Hemoglobin: 13.6 g/dL (ref 13.0–17.0)
Immature Granulocytes: 1 %
Lymphocytes Relative: 8 %
Lymphs Abs: 1.1 K/uL (ref 0.7–4.0)
MCH: 28.2 pg (ref 26.0–34.0)
MCHC: 33.7 g/dL (ref 30.0–36.0)
MCV: 83.6 fL (ref 80.0–100.0)
Monocytes Absolute: 0.6 K/uL (ref 0.1–1.0)
Monocytes Relative: 4 %
Neutro Abs: 12.5 K/uL — ABNORMAL HIGH (ref 1.7–7.7)
Neutrophils Relative %: 87 %
Platelets: 269 K/uL (ref 150–400)
RBC: 4.82 MIL/uL (ref 4.22–5.81)
RDW: 12.3 % (ref 11.5–15.5)
WBC: 14.3 K/uL — ABNORMAL HIGH (ref 4.0–10.5)
nRBC: 0 % (ref 0.0–0.2)

## 2024-04-14 LAB — BASIC METABOLIC PANEL WITH GFR
Anion gap: 13 (ref 5–15)
BUN: 16 mg/dL (ref 8–23)
CO2: 21 mmol/L — ABNORMAL LOW (ref 22–32)
Calcium: 9.1 mg/dL (ref 8.9–10.3)
Chloride: 106 mmol/L (ref 98–111)
Creatinine, Ser: 1.15 mg/dL (ref 0.61–1.24)
GFR, Estimated: >60 mL/min
Glucose, Bld: 157 mg/dL — ABNORMAL HIGH (ref 70–99)
Potassium: 3.9 mmol/L (ref 3.5–5.1)
Sodium: 141 mmol/L (ref 135–145)

## 2024-04-14 LAB — POCT I-STAT 7, (LYTES, BLD GAS, ICA,H+H)
Acid-base deficit: 3 mmol/L — ABNORMAL HIGH (ref 0.0–2.0)
Acid-base deficit: 4 mmol/L — ABNORMAL HIGH (ref 0.0–2.0)
Bicarbonate: 22.2 mmol/L (ref 20.0–28.0)
Bicarbonate: 21.1 mmol/L (ref 20.0–28.0)
Calcium, Ion: 1.15 mmol/L (ref 1.15–1.40)
Calcium, Ion: 1.14 mmol/L — ABNORMAL LOW (ref 1.15–1.40)
HCT: 41 % (ref 39.0–52.0)
HCT: 38 % — ABNORMAL LOW (ref 39.0–52.0)
Hemoglobin: 13.9 g/dL (ref 13.0–17.0)
Hemoglobin: 12.9 g/dL — ABNORMAL LOW (ref 13.0–17.0)
O2 Saturation: 100 %
O2 Saturation: 100 %
Potassium: 4 mmol/L (ref 3.5–5.1)
Potassium: 4.3 mmol/L (ref 3.5–5.1)
Sodium: 140 mmol/L (ref 135–145)
Sodium: 141 mmol/L (ref 135–145)
TCO2: 23 mmol/L (ref 22–32)
TCO2: 22 mmol/L (ref 22–32)
pCO2 arterial: 40.3 mmHg (ref 32–48)
pCO2 arterial: 37.5 mmHg (ref 32–48)
pH, Arterial: 7.349 — ABNORMAL LOW (ref 7.35–7.45)
pH, Arterial: 7.359 (ref 7.35–7.45)
pO2, Arterial: 240 mmHg — ABNORMAL HIGH (ref 83–108)
pO2, Arterial: 207 mmHg — ABNORMAL HIGH (ref 83–108)

## 2024-04-14 LAB — SURGICAL PATHOLOGY

## 2024-04-14 LAB — GLUCOSE, CAPILLARY
Glucose-Capillary: 156 mg/dL — ABNORMAL HIGH (ref 70–99)
Glucose-Capillary: 186 mg/dL — ABNORMAL HIGH (ref 70–99)
Glucose-Capillary: 211 mg/dL — ABNORMAL HIGH (ref 70–99)
Glucose-Capillary: 162 mg/dL — ABNORMAL HIGH (ref 70–99)
Glucose-Capillary: 174 mg/dL — ABNORMAL HIGH (ref 70–99)

## 2024-04-14 MED ORDER — ONDANSETRON HCL 4 MG/2ML IJ SOLN: 4.0000 mg | INTRAMUSCULAR | Status: DC | PRN

## 2024-04-14 MED ORDER — SENNOSIDES-DOCUSATE SODIUM 8.6-50 MG PO TABS
1.0000 | ORAL_TABLET | Freq: Every day | ORAL | Status: DC
  Administered 2024-04-15 – 2024-04-19 (×4): 1 via ORAL
  Filled 2024-04-14 (×5): qty 1

## 2024-04-14 MED ORDER — PROMETHAZINE HCL 25 MG PO TABS: 12.5000 mg | ORAL_TABLET | ORAL | Status: DC | PRN

## 2024-04-14 MED ORDER — ATENOLOL 50 MG PO TABS
50.0000 mg | ORAL_TABLET | Freq: Every day | ORAL | Status: DC
  Administered 2024-04-15 – 2024-04-20 (×6): 50 mg via ORAL
  Filled 2024-04-14 (×6): qty 1

## 2024-04-14 MED ORDER — DEXAMETHASONE SOD PHOSPHATE PF 10 MG/ML IJ SOLN
6.0000 mg | Freq: Four times a day (QID) | INTRAMUSCULAR | Status: AC
  Administered 2024-04-14: 6 mg via INTRAVENOUS

## 2024-04-14 MED ORDER — ALUM & MAG HYDROXIDE-SIMETH 200-200-20 MG/5ML PO SUSP: 30.0000 mL | ORAL | Status: DC | PRN

## 2024-04-14 MED ORDER — MECLIZINE HCL 12.5 MG PO TABS
25.0000 mg | ORAL_TABLET | Freq: Three times a day (TID) | ORAL | Status: DC | PRN
  Administered 2024-04-14 – 2024-04-15 (×2): 25 mg via ORAL
  Filled 2024-04-14 (×4): qty 1

## 2024-04-14 MED ORDER — HEPARIN SODIUM (PORCINE) 5000 UNIT/ML IJ SOLN
5000.0000 [IU] | Freq: Two times a day (BID) | INTRAMUSCULAR | Status: DC
  Administered 2024-04-14 – 2024-04-20 (×12): 5000 [IU] via SUBCUTANEOUS
  Filled 2024-04-14 (×12): qty 1

## 2024-04-14 MED ORDER — ACETAMINOPHEN 10 MG/ML IV SOLN
1000.0000 mg | Freq: Four times a day (QID) | INTRAVENOUS | Status: AC
  Administered 2024-04-14 (×2): 1000 mg via INTRAVENOUS
  Filled 2024-04-14 (×3): qty 100

## 2024-04-14 MED ORDER — INSULIN ASPART 100 UNIT/ML IJ SOLN
0.0000 [IU] | Freq: Three times a day (TID) | INTRAMUSCULAR | Status: DC
  Administered 2024-04-14 (×2): 2 [IU] via SUBCUTANEOUS
  Administered 2024-04-15 – 2024-04-20 (×8): 1 [IU] via SUBCUTANEOUS
  Filled 2024-04-14: qty 1
  Filled 2024-04-14: qty 3
  Filled 2024-04-14: qty 1
  Filled 2024-04-14: qty 2
  Filled 2024-04-14 (×6): qty 1

## 2024-04-14 MED ORDER — DEXAMETHASONE SODIUM PHOSPHATE 4 MG/ML IJ SOLN
4.0000 mg | Freq: Four times a day (QID) | INTRAMUSCULAR | Status: AC
  Administered 2024-04-14: 4 mg via INTRAVENOUS
  Filled 2024-04-14: qty 1

## 2024-04-14 MED ORDER — METFORMIN HCL 500 MG PO TABS
500.0000 mg | ORAL_TABLET | Freq: Two times a day (BID) | ORAL | Status: DC
  Administered 2024-04-14 – 2024-04-20 (×12): 500 mg via ORAL
  Filled 2024-04-14 (×13): qty 1

## 2024-04-14 MED ORDER — METHOCARBAMOL 1000 MG/10ML IJ SOLN
1000.0000 mg | Freq: Three times a day (TID) | INTRAMUSCULAR | Status: AC
  Administered 2024-04-14 – 2024-04-16 (×8): 1000 mg via INTRAVENOUS
  Filled 2024-04-14 (×9): qty 10

## 2024-04-14 MED ORDER — ENSURE PLUS HIGH PROTEIN PO LIQD
237.0000 mL | Freq: Two times a day (BID) | ORAL | Status: DC
  Administered 2024-04-14 – 2024-04-20 (×7): 237 mL via ORAL

## 2024-04-14 MED ORDER — ROSUVASTATIN CALCIUM 20 MG PO TABS
20.0000 mg | ORAL_TABLET | Freq: Every day | ORAL | Status: DC
  Administered 2024-04-14 – 2024-04-19 (×6): 20 mg via ORAL
  Filled 2024-04-14 (×6): qty 1

## 2024-04-14 MED ORDER — ONDANSETRON HCL 4 MG PO TABS: 4.0000 mg | ORAL_TABLET | ORAL | Status: DC | PRN

## 2024-04-14 MED ORDER — DOCUSATE SODIUM 50 MG/5ML PO LIQD
100.0000 mg | Freq: Two times a day (BID) | ORAL | Status: DC
  Administered 2024-04-15 – 2024-04-20 (×6): 100 mg via ORAL
  Filled 2024-04-14 (×10): qty 10

## 2024-04-14 NOTE — Progress Notes (Signed)
 Physical Therapy Treatment Patient Details Name: Larry Dawson MRN: [id number] DOB: 1955-12-16 Today's Date: 04/14/2024   History of Present Illness 69 yo M adm 04/01/24 with Rt weakness with increased pontine hemorrhage consistent with cavernoma. 04/13/24 craniotomy. PMHx: admission 12/8-12/10 with ICH. HTN, HLD, COPD, IBS, Hernia repair, T2DM    PT Comments  PT with increased Rt hemiplegia and Rt gaze s/p craniotomy with increased assist needed for transfers into standing and to chair. Pt remains highly motivated to regain function and return home as able. Pt with 2-/5 knee extension and hip flexion in sitting but immediately buckling without quad activation in standing on RLE. Pt will continue to benefit from acute therapy to maximize mobility, safety and strength with goals downgraded. Patient will benefit from intensive inpatient follow-up therapy, >3 hours/day  HR 89 93% on RA   If plan is discharge home, recommend the following: A lot of help with bathing/dressing/bathroom;Assist for transportation;Help with stairs or ramp for entrance;Assistance with cooking/housework;Two people to help with walking and/or transfers   Can travel by private Theme [employer] (measurements PT);Wheelchair cushion (measurements PT);BSC/3in1    Recommendations for Other Services       Precautions / Restrictions Precautions Precautions: Fall;Other (comment) Recall of Precautions/Restrictions: Impaired Precaution/Restrictions Comments: Rt weakness, Rt hearing loss, Rt gaze     Mobility  Bed Mobility Overal bed mobility: Needs Assistance Bed Mobility: Supine to Sit     Supine to sit: Mod assist     General bed mobility comments: pt moving left hemibody toward Rt side of bed with max multimodal cues and physical assist to bring RLE and RUE with him during transfer. Mod assist sitting balance due to Rt lean with pt able to achieve midline for up to 20 sec after  reaching left with LUE.    Transfers Overall transfer level: Needs assistance   Transfers: Sit to/from Stand Sit to Stand: Mod assist, +2 physical assistance Stand pivot transfers: Max assist, +2 physical assistance         General transfer comment: mod +2 assist to rise from bed and from recliner (x 3 ). Rt knee blocked and support of Rt trunk to rise with heavy Rt lean and Rt knee buckling. Max +2 assist to stand and pivot to left    Ambulation/Gait               General Gait Details: unable this date   Stairs             Wheelchair Mobility     Tilt Bed    Modified Rankin (Stroke Patients Only) Modified Rankin (Stroke Patients Only) Pre-Morbid Rankin Score: No significant disability Modified Rankin: Severe disability     Balance Overall balance assessment: Needs assistance Sitting-balance support: No upper extremity supported, Feet supported, Single extremity supported Sitting balance-Leahy Scale: Poor Sitting balance - Comments: posterior rt lean without UB support, LUE support to achive midline Postural control: Posterior lean, Right lateral lean Standing balance support: Bilateral upper extremity supported, During functional activity Standing balance-Leahy Scale: Zero Standing balance comment: reliant on external support and Rt knee block                            Communication Communication Communication: No apparent difficulties  Cognition Arousal: Alert Behavior During Therapy: Flat affect   PT - Cognitive impairments: Safety/Judgement, Problem solving, Awareness  PT - Cognition Comments: decreased awareness of deficits, safety and LOB, Rt gaze and repeatedly stating he can look left despite inability to cross midline Following commands: Impaired Following commands impaired: Follows one step commands with increased time    Cueing Cueing Techniques: Verbal cues, Tactile cues, Gestural cues   Exercises      General Comments        Pertinent Vitals/Pain Pain Assessment Pain Assessment: No/denies pain    Home Living                          Prior Function            PT Goals (current goals can now be found in the care plan section) Acute Rehab PT Goals Patient Stated Goal: return to walking and home PT Goal Formulation: With patient Time For Goal Achievement: 04/28/24 Potential to Achieve Goals: Fair Progress towards PT goals: Goals downgraded-see care plan    Frequency    Min 4X/week      PT Plan      Co-evaluation              AM-PAC PT 6 Clicks Mobility   Outcome Measure  Help needed turning from your back to your side while in a flat bed without using bedrails?: A Lot Help needed moving from lying on your back to sitting on the side of a flat bed without using bedrails?: A Lot Help needed moving to and from a bed to a chair (including a wheelchair)?: Total Help needed standing up from a chair using your arms (e.g., wheelchair or bedside chair)?: Total Help needed to walk in hospital room?: Total Help needed climbing 3-5 steps with a railing? : Total 6 Click Score: 8    End of Session Equipment Utilized During Treatment: Gait belt Activity Tolerance: Patient tolerated treatment well Patient left: in chair;with call bell/phone within reach;with chair alarm set;with nursing/sitter in room Nurse Communication: Mobility status PT Visit Diagnosis: Unsteadiness on feet (R26.81);Muscle weakness (generalized) (M62.81);Difficulty in walking, not elsewhere classified (R26.2);Hemiplegia and hemiparesis;Other abnormalities of gait and mobility (R26.89) Hemiplegia - Right/Left: Right Hemiplegia - dominant/non-dominant: Dominant Hemiplegia - caused by: Nontraumatic intracerebral hemorrhage     Time: 0840-0904 PT Time Calculation (min) (ACUTE ONLY): 24 min  Charges:    $Therapeutic Activity: 8-22 mins PT General Charges $$ ACUTE PT  VISIT: 1 Visit                     Lenoard SQUIBB, PT Acute Rehabilitation Services Office: [phone]    Lenoard NOVAK Belanna Manring 04/14/2024, 10:49 AM

## 2024-04-14 NOTE — Progress Notes (Signed)
 Occupational Therapy Treatment Patient Details Name: Larry Dawson MRN: [id number] DOB: [birth year] Today's Date: 04/14/2024   History of present illness 69 yo M adm 04/01/24 with Rt weakness with increased pontine hemorrhage consistent with cavernoma. 04/13/24 craniotomy. PMHx: admission 12/8-12/10 with ICH. HTN, HLD, COPD, IBS, Hernia repair, T2DM   OT comments  Pt seated in recliner and agreeable to OT session.  Post op craniotomy 1/5.  Patient with R gaze preference, noted likely cranial nerve evolvement L eye (turned in towards nose) and R eye with more mobility but unable to come fully to midline.  Pt denies diplopia, although dysconjugate gaze, reports able to see on L side of environment.  Pt is able to locate targets on L side, but does overshoot and require increased time; able to find 12 position on plate with minimal R ward shift.  Plan to continue to assess vision through functional tasks next session. Pt completes UB dressing with mod assist, LB dressing with total assist today.  Increased R lateral and posterior lean, R UE continues to be weak at shoulder with decreased functional coordination and sensation (using UE as a stabilizer).  Continue to recommend >3hrs/day inpatient setting at dc.  Will follow acutely.       If plan is discharge home, recommend the following:  Two people to help with walking and/or transfers;A lot of help with bathing/dressing/bathroom;Assistance with cooking/housework;Assist for transportation;Help with stairs or ramp for entrance;Direct supervision/assist for medications management;Direct supervision/assist for financial management   Equipment Recommendations  Other (comment) (defer)    Recommendations for Other Services Rehab consult;PT consult    Precautions / Restrictions Precautions Precautions: Fall;Other (comment) Recall of Precautions/Restrictions: Impaired Precaution/Restrictions Comments: Rt weakness, Rt hearing loss, Rt  gaze Restrictions Weight Bearing Restrictions Per Provider Order: No       Mobility Bed Mobility               General bed mobility comments: OOB in recliner    Transfers                   General transfer comment: OOB in recliner, OT Eval from sitting     Balance Overall balance assessment: Needs assistance Sitting-balance support: No upper extremity supported, Feet supported Sitting balance-Leahy Scale: Poor Sitting balance - Comments: posterior rt lean without UB support, LUE support to achive midline Postural control: Posterior lean, Right lateral lean                                 ADL either performed or assessed with clinical judgement   ADL Overall ADL's : Needs assistance/impaired Eating/Feeding: Minimal assistance;Sitting   Grooming: Minimal assistance;Sitting           Upper Body Dressing : Moderate assistance;Sitting   Lower Body Dressing: Total assistance;Sitting/lateral leans     Toilet Transfer Details (indicate cue type and reason): deferred                Extremity/Trunk Assessment Upper Extremity Assessment Upper Extremity Assessment: Right hand dominant;RUE deficits/detail RUE Deficits / Details: grossly 3-/5, poor shoulder flexion but able to abduct with increased effort.  some increased flexor tone.  Difficutly using UE functionally with poor sensation and coordination. using UE as a stabilizer RUE Sensation: decreased light touch;decreased proprioception RUE Coordination: decreased fine motor;decreased gross motor   Lower Extremity Assessment Lower Extremity Assessment: Defer to PT evaluation        Vision  Vision Assessment?: Yes Eye Alignment: Impaired (comment) Ocular Range of Motion: Restricted on the right Alignment/Gaze Preference: Gaze right Tracking/Visual Pursuits: Impaired - to be further tested in functional context Saccades: Impaired - to be further tested in functional  context Convergence: Impaired - to be further tested in functional context Visual Fields: Impaired-to be further tested in functional context Diplopia Assessment: Disappears with one eye closed Depth Perception: Overshoots Additional Comments: pt reports no difficulty with his vision.  R gaze preference and demonstrates inability to move L eye (turned in to nasal area on R) and L eye with R gaze, able to scan towards midline but not fully.  He reports only seeing double when looking at the clock, but is able to read it with increased time; otherwise does not see double.  He Has disconjugate gaze, but reports being able to see therapist on L side?  Pt locating finger on L side with increased time, overshooting, but locating all targets without significant difficulty.  Using plate pt able to locate 12 position with R ward shift minimally. continue assessment.   Perception     Praxis     Communication Communication Communication: No apparent difficulties Factors Affecting Communication:  (pt reports his hearing is a little worse than yesterday)   Cognition Arousal: Alert Behavior During Therapy: Flat affect Cognition: Cognition impaired     Awareness: Online awareness impaired   Attention impairment (select first level of impairment): Sustained attention Executive functioning impairment (select all impairments): Problem solving, Reasoning OT - Cognition Comments: pt with slow processing, oriented and following commands.  Some decreased awareness to deficits and safety.                 Following commands: Impaired Following commands impaired: Follows one step commands with increased time      Cueing   Cueing Techniques: Verbal cues, Tactile cues, Gestural cues  Exercises      Shoulder Instructions       General Comments BP stable    Pertinent Vitals/ Pain       Pain Assessment Pain Assessment: No/denies pain  Home Living                                           Prior Functioning/Environment              Frequency  Min 2X/week        Progress Toward Goals  OT Goals(current goals can now be found in the care plan section)  Progress towards OT goals: Progressing toward goals     Plan      Co-evaluation                 AM-PAC OT 6 Clicks Daily Activity     Outcome Measure   Help from another person eating meals?: A Little Help from another person taking care of personal grooming?: A Little Help from another person toileting, which includes using toliet, bedpan, or urinal?: Total Help from another person bathing (including washing, rinsing, drying)?: A Lot Help from another person to put on and taking off regular upper body clothing?: A Lot Help from another person to put on and taking off regular lower body clothing?: Total 6 Click Score: 12    End of Session    OT Visit Diagnosis: Unsteadiness on feet (R26.81);Other abnormalities of gait and mobility (R26.89);Muscle weakness (generalized) (M62.81);Low vision,  both eyes (H54.2);Other symptoms and signs involving the nervous system (R29.898);Hemiplegia and hemiparesis Hemiplegia - Right/Left: Right Hemiplegia - dominant/non-dominant: Dominant Hemiplegia - caused by: Nontraumatic intracerebral hemorrhage   Activity Tolerance Patient tolerated treatment well   Patient Left in chair;with call bell/phone within reach;with chair alarm set   Nurse Communication Mobility status;Precautions        Time: 8885-8852 OT Time Calculation (min): 33 min  Charges: OT General Charges $OT Visit: 1 Visit OT Treatments $Self Care/Home Management : 23-37 mins  Etta NOVAK, OT Acute Rehabilitation Services Office [phone] Secure Chat Preferred    Etta GORMAN Hope 04/14/2024, 12:59 PM

## 2024-04-14 NOTE — Progress Notes (Signed)
 Inpatient Rehab Admissions Coordinator Note:   Per therapy recommendations patient was screened for CIR candidacy by Reche FORBES Lowers, PT. At this time, pt appears to be a potential candidate for CIR. I will place an order for rehab consult for full assessment, per our protocol.  Please contact me any with questions.SABRA Reche Lowers, PT, DPT [phone] 04/14/2024 12:48 PM

## 2024-04-14 NOTE — Progress Notes (Signed)
 Modified Barium Swallow Study  Patient Details  Name: KARINA LENDERMAN MRN: [id number] Date of Birth: 1956-03-22  Today's Date: 04/14/2024  Modified Barium Swallow completed.  Full report located under Chart Review in the Imaging Section.  History of Present Illness Mr. Korbyn Chopin has known history of pontine lesion concerning for cavernoma.  He has now undergone posterior craniotomy with tumor resection on 1/5 and SLP asked to reassess swallow via MBS.  Previously evaluated with MBS prior to surgery to ensure safety with swallowing. Pt demonstrated delayed swallow intiaiton, but no aspiration, recommended to consume regular/thin and has tolerated well.   Clinical Impression Pt demonstrates slightly worsened swallow function since last MBS, though impairments are mild and Pt continues to be able to eat and drink foods of choice with minimal aspiration risk if using precautions.   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 As seen previously there is a consistent mild delay in swallowing initiation and today, with large consecutive sips pt had trace penetration and aspiration without sensation. Suspect laryngeal sensation also impacted. Taking small single sips, clearing throat occasionally and doing a dry swallow to clear any oral and pharyngeal residue are all helpful. Pt is recommended to consume a regular diet and thin liquids, however he reports that he has only been eating applesauce and jello in the days leading up to surgery because he felt his swallowing was off. SLP emphasized that he might have some numbness, but his strength is quite good and good nutrition is important at this time. Encouraged pt to try a mechanical soft diet and thin liquids. SLP will f/u to reinforce results and strategies.  Factors that may increase risk of adverse  event in presence of aspiration [employer]e [employer] Lianne 2021):    Swallow Evaluation Recommendations Liquid Administration via: Cup;Straw (but no consecutive sips) Medication Administration: Whole meds with puree Supervision: Set-up assistance for safety;Staff to assist with self-feeding;Other (comment) (mightneed help due to vision) Swallowing strategies  : Slow rate;Small bites/sips;Multiple dry swallows after each bite/sip;Clear throat intermittently Postural changes: Position pt fully upright for meals Oral care recommendations: Oral care BID (2x/day)      Keyari Kleeman, Consuelo Fitch 04/14/2024,10:51 AM

## 2024-04-14 NOTE — Progress Notes (Signed)
 04/14/2024 Appreciate SLP recs: Swallow Evaluation Recommendations Liquid Administration via: Cup;Straw (but no consecutive sips) Medication Administration: Whole meds with puree Supervision: Set-up assistance for safety;Staff to assist with self-feeding;Other (comment) (mightneed help due to vision) Swallowing strategies  : Slow rate;Small bites/sips;Multiple dry swallows after each bite/sip;Clear throat intermittently Postural changes: Position pt fully upright for meals Oral care recommendations: Oral care BID (2x/day)  Up in chair, per RN tolerated PO well  Lines removed, BP good  Discussed with Dr. [name]: start heparin  5000 BID, okay for progressive, will ask TRH to take back over 04/15/24  Rolan Sharps MD PCCM

## 2024-04-14 NOTE — Progress Notes (Signed)
 "  NAME:  Larry Dawson, MRN:  [id number], DOB:  [birth year], LOS: 12 ADMISSION DATE:  04/01/2024, CONSULTATION DATE:  04/13/24 REFERRING MD:  [name], CHIEF COMPLAINT:  Weakness   History of Present Illness:  69 year old man w/ hx of COPD, HTN, HLD presenting with imbalance and ill-defined feelings of numbness throughout head.  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  He arrives to ICU for further management.  Denies any complaints at present but somnolent emerging from anesthesia.  CCM to help with postop management.  Pertinent  Medical History   Past Medical History:  Diagnosis Date   Allergy    Anal fissure    COPD (chronic obstructive pulmonary disease) (HCC)    Albuterol  PRN.   Diabetes mellitus without complication (HCC)    Hydrocele    Hyperlipidemia    Hypertension    IBS (irritable bowel syndrome) 05/22/2013   per chart note-pt denied     Significant Hospital Events: Including procedures, antibiotic start and stop dates in addition to other pertinent events     Interim History / Subjective:  More awake, very loquacious Wants something to help him sleep Having some muscle spasms on left neck  Objective    Blood pressure 117/74, pulse 91, temperature 98.9 F (37.2 C), temperature source Oral, resp. rate (!) 24, height 5' 7 (1.702 m), weight 102.2 kg, SpO2 93%.        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 [name]  DM- SSI  GERD- PTA PPI  [place], SLP, and clinical for swallowing Eye drops to left eye (facial nerve involvement) PT/OT input appreciated Remove A line, foley Goal SBP < 150, can hopefully just use PRNs While PO is uncertain, steroids, tylenol , and muscle relaxant changed to IV Will follow while in ICU  Claudene Toribio BROCKS, MD [facility] See Amion for daytime contact information E-Link [phone] Notify [facility] first if any neurological issues with patient   Labs   CBC: Recent Labs  Lab 04/08/24 0224 04/12/24 0722 04/13/24 0147 04/14/24 0500  WBC 9.1 8.2 7.4 14.3*  NEUTROABS  --  5.3 4.2 12.5*  HGB 15.0 15.2 15.1 13.6  HCT 44.8 45.6 45.4 40.3  MCV 83.0 84.0 83.8 83.6  PLT 271 254 268 269    Basic Metabolic Panel: Recent Labs  Lab 04/08/24 0224 04/12/24 0722 04/13/24 0147 04/14/24 0500  NA 138 137 140 141  K 3.5 4.2 3.9 3.9  CL 102 103 104 106  CO2 24 22 25  21*  GLUCOSE 109* 102* 98 157*  BUN 18 19 20 16   CREATININE 1.18 1.26* 1.24 1.15  CALCIUM  9.3 9.4 9.2 9.1   GFR: Estimated Creatinine Clearance: 71 mL/min (by C-G formula based on SCr of 1.15 mg/dL). Recent Labs  Lab 04/08/24 0224 04/12/24 0722 04/13/24 0147 04/14/24 0500  WBC 9.1 8.2 7.4 14.3*    Liver Function Tests:  No results for input(s): AST, ALT, ALKPHOS, BILITOT, PROT, ALBUMIN  in the last 168 hours. No results for input(s): LIPASE, AMYLASE in the last 168 hours. No results for input(s): AMMONIA in the last 168 hours.  ABG    Component Value Date/Time   HCO3 25.9 04/08/2024 0222   TCO2 31 12/04/2021 1139   O2SAT 95.8 04/08/2024 0222     Coagulation Profile: No results for input(s): INR, PROTIME in the last 168 hours.  Cardiac Enzymes: No results for input(s): CKTOTAL, CKMB, CKMBINDEX, TROPONINI in the last 168 hours.  HbA1C: Hemoglobin A1C  Date/Time Value Ref Range Status   01/06/2024 08:55 AM 7.2 (A) 4.0 - 5.6 % Final  10/02/2023 02:51 PM 12.1 (A) 4.0 - 5.6 % Final   Hgb A1c MFr Bld  Date/Time Value Ref Range Status  04/05/2024 02:52 AM 6.8 (H) 4.8 - 5.6 % Final    Comment:    (NOTE) Diagnosis of Diabetes The following HbA1c ranges recommended by the American Diabetes Association (ADA) may be used as an aid in the diagnosis of diabetes mellitus.  Hemoglobin             Suggested A1C NGSP%              Diagnosis  <5.7                   Non Diabetic  5.7-6.4                Pre-Diabetic  >6.4                   Diabetic  <7.0                   Glycemic control for                       adults with diabetes.    03/16/2021 09:55 AM 7.8 (H) 4.6 - 6.5 % Final    Comment:    Glycemic Control Guidelines for People with Diabetes:Non Diabetic:  <6%Goal of Therapy: <7%Additional Action Suggested:  >8%     CBG: Recent Labs  Lab 04/13/24 1420 04/13/24 1659 04/13/24 1949 04/13/24 2346 04/14/24 0417  GLUCAP 138* 168* 131* 156* 156*    Review of Systems:   Limited due to somnolence, denies sign pain/sob  Past Medical History:  He,  has a past medical history of Allergy, Anal fissure, COPD (chronic obstructive pulmonary disease) (HCC), Diabetes mellitus without complication (HCC), Hydrocele, Hyperlipidemia, Hypertension, and IBS (irritable bowel syndrome) (05/22/2013).   Surgical History:   Past Surgical History:  Procedure Laterality Date   HERNIA REPAIR  prior to 2013   HYDROCELE EXCISION Bilateral 11/23/2013   Procedure: HYDROCELECTOMY ADULT;  Surgeon: Alm GORMAN Fragmin, MD;  [facility];  Service: Urology;  Laterality: Bilateral;   VASECTOMY  prior to 2013     Social History:   reports that he has quit smoking. His smoking use included cigarettes. He has never used smokeless tobacco. He reports that he does not currently use alcohol . He reports that he does not use drugs.   Family History:  His family history  includes Diabetes in his brother, brother, brother, and father; Hypertension in his brother and father.   Allergies Allergies[1]   Home Medications  Prior to Admission medications  Medication Sig Start Date End Date Taking? Authorizing Provider  Arginine 500 MG CAPS Take 1 capsule by mouth daily.   Yes [provider]  Ascorbic Acid (VITAMIN C PO) Take 1 tablet by mouth daily.   Yes [provider]  atenolol -chlorthalidone  (TENORETIC ) 50-25 MG tablet Take 1 tablet by mouth daily.   Yes [provider]  cetirizine  (ZYRTEC ) 10 MG tablet Take 1 tablet (10 mg total) by mouth daily. Patient taking differently: Take 10 mg by mouth daily as needed for allergies. 05/10/17  Yes Burky, Natalie B, NP  cholecalciferol (VITAMIN D) 1000 units tablet Take 1,000 Units by mouth daily.   Yes [provider]  glipiZIDE  (GLUCOTROL ) 5 MG tablet TAKE 1 TABLET BY MOUTH 2 TIMES A DAY BEFORE A MEAL 01/25/24  Yes Sagardia, Emil Schanz, MD  metFORMIN  (GLUCOPHAGE ) 500 MG tablet TAKE 1 TABLET BY MOUTH TWICE A DAY WITH A MEAL 01/13/24  Yes Sagardia, Emil Schanz, MD  Multiple Vitamins-Minerals (ZINC  PO) Take 1 tablet by mouth daily.   Yes [provider]  Omega-3 Fatty Acids (FISH OIL) 1000 MG CAPS Take 1 capsule by mouth daily.   Yes [provider]  omeprazole  (PRILOSEC) 20 MG capsule Take 20 mg by mouth daily.   Yes [provider]  sildenafil  (VIAGRA ) 100 MG tablet Take 0.5-1 tablets (50-100 mg total) by mouth daily as needed for erectile dysfunction. 10/04/20  Yes Sagardia, Emil Schanz, MD  tirzepatide  (MOUNJARO ) 5 MG/0.5ML Pen Inject 5 mg into the skin once a week. 01/06/24  Yes Sagardia, Emil Schanz, MD  rosuvastatin  (CRESTOR ) 20 MG tablet Take 20 mg by mouth at bedtime. Patient not taking: Reported on 04/01/2024 03/19/24   [provider]     Critical care time: N/A               [1]  Allergies Allergen Reactions   Ace Inhibitors  Swelling    Suspected angioedema   Penicillins Anaphylaxis, Hives, Rash and Other (See Comments)    Throat swells   Latex Hives, Swelling and Rash   "

## 2024-04-14 NOTE — Progress Notes (Signed)
 " History  69 year old man w/ hx of COPD, HTN, HLD presenting with imbalance and ill-defined feelings of numbness throughout head. 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 He transferred to the ICU for further management.   Assessment/Plan: Today the patient is showing signs of early neurological revovery. Specifically in the RUE strength, auditory function, and affect.   Significant improvement of the RUE 4/5. RLE remains weak at 2/5. He is able to bend his knee but is weak to gravity  He reports that light touch feels the same bilaterally while firm or deep touch feels different with paresthesias on the right side.   Gaze preference still present. Eyes deviated to the right and fixed.   He reports a significant imporvement in auditory function. Prior he was experiencing what he describes as muffled and like people were distant. Today he reports that his hearing is pretty much back to normal.   No changes to plan of care    Subjective: Patient reports feeling significantly better.   Objective: Vital signs in last 24 hours: Temp:  [97.8 F (36.6 C)-99.7 F (37.6 C)] 99 F (37.2 C) (01/06 0800) Pulse Rate:  [88-130] 88 (01/06 1033) Resp:  [14-24] 21 (01/06 0900) BP: (117-165)/(65-97) 120/80 (01/06 1033) SpO2:  [91 %-97 %] 94 % (01/06 0900) Arterial Line BP: (124-164)/(48-90) 144/62 (01/06 0737)  Intake/Output from previous day: 01/05 0701 - 01/06 0700 In: 3582 [I.V.:2832; IV Piggyback:750] Out: 2900 [Urine:2850; Blood:50] Intake/Output this shift: Total I/O In: 164.2 [I.V.:64.2; IV Piggyback:100] Out: 160 [Urine:160]   Physical Exam HENT:     Head: Normocephalic.     Nose: Nose normal.  Eyes:     Pupils: Pupils are equal, round, and reactive to light.  Left Sided INO PR 3+ Cardiovascular:     Rate and Rhythm: Normal rate.  Pulmonary:     Effort: Pulmonary effort is normal.  Abdominal:     General:  Abdomen is flat.  Musculoskeletal:     Cervical back: Normal range of motion.  Neurological:     Mental Status: Patient is alert.     Cranial Nerves: Cranial nerves 2-12 are intact.     Sensory: Sensation is intact.     Motor: Motor function is intact.  RUE 2/5  RLE 3/5    Coordination: Coordination is intact.     Lab Results: Recent Labs    04/13/24 0147 04/13/24 0956 04/13/24 1213 04/14/24 0500  WBC 7.4  --   --  14.3*  HGB 15.1   < > 12.9* 13.6  HCT 45.4   < > 38.0* 40.3  PLT 268  --   --  269   < > = values in this interval not displayed.   BMET Recent Labs    04/13/24 0147 04/13/24 0956 04/13/24 1213 04/14/24 0500  NA 140   < > 141 141  K 3.9   < > 4.3 3.9  CL 104  --   --  106  CO2 25  --   --  21*  GLUCOSE 98  --   --  157*  BUN 20  --   --  16  CREATININE 1.24  --   --  1.15  CALCIUM  9.2  --   --  9.1   < > = values in this interval not displayed.    Studies/Results: MR BRAIN WO CONTRAST Result Date: 04/14/2024 EXAM: MRI BRAIN WITHOUT CONTRAST 04/14/2024 04:01:43 AM TECHNIQUE: Multiplanar  multisequence MRI of the head/brain was performed without the administration of intravenous contrast. COMPARISON: MR Head Without Contrast 04/11/2024 and earlier. CLINICAL HISTORY: 69 year old male with central pontine hemorrhage, recently postoperative from braintem hematoma vs cavernoma resection. FINDINGS: BRAIN AND VENTRICLES: Stable major intracranial vascular flow voids including dominant distal left vertebral artery which appears to be the primary supply to basilar. Postoperative changes in the left posterior fossa tracking to the left cerebellopontine angle and cystic resection there of previous relatively large and rounded nearly 3 cm pontine hemorrhage / hematoma (series 8 image 10). Residual blood products but no measurable hematoma now on T1 or T2 weighted imaging. Regional brainstem and left cerebellar peduncle T2 and FLAIR hyperintense edema. Pontine expansion has  regressed. Basilar cisterns remain patent. Susceptibility weighted imaging also suggestive of left hemisphere and parasagittal postoperative pneumocephalus. Heterogeneous T2 and FLAIR hyperintensity in the deep gray nuclei including bilateral thalami is stable. No convincing diffusion restriction when allowing for susceptibility artifact on DWI. Patchy and confluent widely scattered cerebral white matter T2 and FLAIR hyperintensity is stable. Trace intraventricular hemorrhage layering in the occipital horns is new. No ventriculomegaly. The sella is unremarkable. No midline shift. ORBITS: Rightward gaze deviation now. SINUSES AND MASTOIDS: Stable sphenoid sinus disease. Mastoid air cells remain well aerated. BONES AND SOFT TISSUES: Evidence of suboccipital craniotomy now. Postoperative changes to the left scalp. Normal marrow signal. IMPRESSION: 1. No adverse features status post left suboccipital craniotomy and brainstem hematoma resection. Expected postoperative changes including probable pneumocephalus. 2. No new intracranial abnormality. 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 Electronically signed by: Helayne Hurst MD 04/14/2024 06:29 AM EST RP Workstation: [id number]   DG Abd Portable 1V Result Date: 04/13/2024 EXAM: 1 VIEW XRAY OF THE ABDOMEN 04/13/2024 04:22:48 PM COMPARISON: None available. CLINICAL HISTORY: Encounter for nasogastric (NG) tube placement FINDINGS: LINES, TUBES AND DEVICES: Nasogastric tube in place with tip overlying the distal esophagus. BOWEL: Nonobstructive bowel gas pattern. SOFT TISSUES: No abnormal calcifications. BONES: No acute fracture. IMPRESSION: 1. Nasogastric tube tip overlies the distal esophagus; recommend advancement into the stomach. Electronically signed by: Greig Pique MD 04/13/2024 05:19 PM EST RP Workstation: [id number]     Jayson PARAS Massimiliano Rohleder 04/14/2024, 11:06 AM     "

## 2024-04-15 DIAGNOSIS — I629 Nontraumatic intracranial hemorrhage, unspecified: Secondary | ICD-10-CM | POA: Diagnosis not present

## 2024-04-15 LAB — CBC WITH DIFFERENTIAL/PLATELET
Abs Immature Granulocytes: 0.08 K/uL — ABNORMAL HIGH (ref 0.00–0.07)
Basophils Absolute: 0 K/uL (ref 0.0–0.1)
Basophils Relative: 0 %
Eosinophils Absolute: 0 K/uL (ref 0.0–0.5)
Eosinophils Relative: 0 %
HCT: 38 % — ABNORMAL LOW (ref 39.0–52.0)
Hemoglobin: 12.7 g/dL — ABNORMAL LOW (ref 13.0–17.0)
Immature Granulocytes: 1 %
Lymphocytes Relative: 13 %
Lymphs Abs: 1.9 K/uL (ref 0.7–4.0)
MCH: 27.8 pg (ref 26.0–34.0)
MCHC: 33.4 g/dL (ref 30.0–36.0)
MCV: 83.2 fL (ref 80.0–100.0)
Monocytes Absolute: 1.1 K/uL — ABNORMAL HIGH (ref 0.1–1.0)
Monocytes Relative: 7 %
Neutro Abs: 12.1 K/uL — ABNORMAL HIGH (ref 1.7–7.7)
Neutrophils Relative %: 79 %
Platelets: 256 K/uL (ref 150–400)
RBC: 4.57 MIL/uL (ref 4.22–5.81)
RDW: 12.6 % (ref 11.5–15.5)
WBC: 15.1 K/uL — ABNORMAL HIGH (ref 4.0–10.5)
nRBC: 0 % (ref 0.0–0.2)

## 2024-04-15 LAB — BASIC METABOLIC PANEL WITH GFR
Anion gap: 10 (ref 5–15)
BUN: 18 mg/dL (ref 8–23)
CO2: 22 mmol/L (ref 22–32)
Calcium: 8.7 mg/dL — ABNORMAL LOW (ref 8.9–10.3)
Chloride: 109 mmol/L (ref 98–111)
Creatinine, Ser: 0.99 mg/dL (ref 0.61–1.24)
GFR, Estimated: >60 mL/min
Glucose, Bld: 122 mg/dL — ABNORMAL HIGH (ref 70–99)
Potassium: 4.1 mmol/L (ref 3.5–5.1)
Sodium: 141 mmol/L (ref 135–145)

## 2024-04-15 LAB — GLUCOSE, CAPILLARY
Glucose-Capillary: 120 mg/dL — ABNORMAL HIGH (ref 70–99)
Glucose-Capillary: 133 mg/dL — ABNORMAL HIGH (ref 70–99)
Glucose-Capillary: 106 mg/dL — ABNORMAL HIGH (ref 70–99)
Glucose-Capillary: 91 mg/dL (ref 70–99)

## 2024-04-15 MED ORDER — CALCIUM CARBONATE ANTACID 500 MG PO CHEW
400.0000 mg | CHEWABLE_TABLET | Freq: Two times a day (BID) | ORAL | Status: DC
  Administered 2024-04-15 – 2024-04-19 (×8): 400 mg via ORAL
  Filled 2024-04-15 (×8): qty 2

## 2024-04-15 MED ORDER — PANTOPRAZOLE SODIUM 40 MG PO TBEC
40.0000 mg | DELAYED_RELEASE_TABLET | Freq: Every day | ORAL | Status: DC
  Administered 2024-04-15 – 2024-04-19 (×5): 40 mg via ORAL
  Filled 2024-04-15 (×5): qty 1

## 2024-04-15 MED ORDER — CALCIUM CARBONATE ANTACID 500 MG PO CHEW: 400.0000 mg | CHEWABLE_TABLET | Freq: Two times a day (BID) | ORAL | Status: DC

## 2024-04-15 NOTE — Progress Notes (Signed)
 [facility]  PROGRESS NOTE  Larry Dawson FMW:[id number] DOB: 02-Jan-1956 DOA: 04/01/2024 PCP: Larry Emil Schanz, MD   Brief HPI:   69 year old man w/ hx of COPD, HTN, HLD presenting with imbalance and ill-defined feelings of numbness throughout head. 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 He arrives to ICU for further management.     Assessment/Plan:   Symptomatic brainstem covernoma  -S/P posterior fossa cranitomy and resection on 04/14/23 by Dr. Janjua  sulin with NovoLog  -CBG well-controlled   GERD - Continue pantoprazole   Hypertension Keep SBP below 150 .  IV hydralazine  PRN ordered. Continue atenolol  -    COPD Stable, no signs of acute exacerbation.  Does not appear to be on inhalers at home.   Type 2 diabetes Hemoglobin A1c 7.2 on 01/06/2024.   Placed on sensitive sliding scale insulin  every 4 hours. -CBG well-controlled      DVT prophylaxis: Heparin   Medications     artificial tears  1 drop Left Eye Q2H while awake   atenolol   50 mg Oral Daily   Chlorhexidine  Gluconate Cloth  6 each Topical Daily   docusate  100 mg Oral BID   feeding supplement  237 mL Oral BID BM   heparin  injection (subcutaneous)  5,000 Units Subcutaneous Q12H   insulin  aspart  0-9 Units Subcutaneous TID AC & HS   metFORMIN   500 mg Oral BID WC   methocarbamol  (ROBAXIN ) injection  1,000 mg Intravenous Q8H   pantoprazole  (PROTONIX ) IV  40 mg Intravenous QHS   polyethylene glycol  17 g Oral Daily   rosuvastatin   20 mg Oral QHS   senna-docusate  1 tablet Oral QHS     Data Reviewed:   CBG:  Recent Labs  Lab 04/14/24 0800 04/14/24 1117 04/14/24 1656 04/14/24 2119 04/15/24 0804  GLUCAP 186* 211* 162* 174* 120*    SpO2: 98 % O2 Flow Rate (L/min): 2 L/min    Vitals:   04/15/24 0600 04/15/24 0700 04/15/24 0800 04/15/24 0900  BP: (!) 144/81 (!) 157/78 (!) 149/85 (!) 152/86  Pulse: 72 74 77    Resp: 19 20 19 17   Temp:   98 F (36.7 C)   TempSrc:   Oral   SpO2: 99% 100% 98%   Weight:      Height:          Data Reviewed:  Basic Metabolic Panel: Recent Labs  Lab 04/12/24 0722 04/13/24 0147 04/13/24 0956 04/13/24 1213 04/14/24 0500 04/15/24 0546  NA 137 140 140 141 141 141  K 4.2 3.9 4.0 4.3 3.9 4.1  CL 103 104  --   --  106 109  CO2 22 25  --   --  21* 22  GLUCOSE 102* 98  --   --  157* 122*  BUN 19 20  --   --  16 18  CREATININE 1.26* 1.24  --   --  1.15 0.99  CALCIUM  9.4 9.2  --   --  9.1 8.7*    CBC: Recent Labs  Lab 04/12/24 0722 04/13/24 0147 04/13/24 0956 04/13/24 1213 04/14/24 0500 04/15/24 0546  WBC 8.2 7.4  --   --  14.3* 15.1*  NEUTROABS 5.3 4.2  --   --  12.5* 12.1*  HGB 15.2 15.1 13.9 12.9* 13.6 12.7*  HCT 45.6 45.4 41.0 38.0* 40.3 38.0*  MCV 84.0 83.8  --   --  83.6 83.2  PLT 254 268  --   --  269 256    LFT No results for input(s): AST, ALT, ALKPHOS, BILITOT, PROT, ALBUMIN  in the last 168 hours.   Antibiotics: Anti-infectives (From admission, onward)    Start     Dose/Rate Route Frequency Ordered Stop   04/13/24 2200  ceFAZolin  (ANCEF ) IVPB 2g/100 mL premix        2 g 200 mL/hr over 30 Minutes Intravenous Every 8 hours 04/13/24 1549 04/14/24 1400   04/13/24 0941  vancomycin  (VANCOCIN ) powder  Status:  Discontinued          As needed 04/13/24 0942 04/13/24 1405   04/13/24 0645  ceFAZolin  (ANCEF ) IVPB 2g/100 mL premix        2 g 200 mL/hr over 30 Minutes Intravenous  Once 04/13/24 0550 04/13/24 1300        CONSULTS heparin   Code Status: Full code  Family Communication:      Subjective   Patient seen and examined, was having hallucinations this morning.  Resolved at this time.   Objective    Physical Examination:   General-appears in no acute distress Heart-S1-S2, regular, no murmur auscultated Lungs-clear to auscultation bilaterally, no wheezing or crackles auscultated Abdomen-soft,  nontender, no organomegaly Extremities-no edema in the lower extremities Neuro-alert, oriented x3, right-sided weakness, right gaze preference            Larry Dawson   [facility] If 7PM-7AM, please contact night-coverage at [email or web address], Office  [phone]   04/15/2024, 9:15 AM  LOS: 13 days

## 2024-04-15 NOTE — TOC Progression Note (Signed)
 Transition of Care [facility]) - Progression Note    Patient Details  Name: Larry Dawson MRN: [id number] Date of Birth: 05-30-1955  [facility] CM/SW Contact  Adrianna Dudas M, RN Phone Number: 04/15/2024, 4:28 PM  Clinical Narrative:    Patient s/p craniotomy on 04/13/2024.  PT/OT recommending CIR and consult requested.  Spoke with patient's wife and daughter, per their request: reviewed PT/OT recommendations for inpatient rehab, and they are in agreement.  Rehab [facility] spoke with wife earlier today; she states that she and her daughters will be able to provide needed assistance at discharge.  AC will start auth pending patient's progress with therapies.  Answered all of wife/daughter's questions to the best of my ability.  Assured them that RNCM/CSW are available to assist as needed.  They appreciated my call.    Expected Discharge Plan: IP Rehab Facility Barriers to Discharge: Continued Medical Work up               Expected Discharge Plan and Services   Discharge Planning Services: CM Consult   Living arrangements for the past 2 months: Single Family Home                                       Social Drivers of Health (SDOH) Interventions SDOH Screenings   Food Insecurity: No Food Insecurity (04/02/2024)  Housing: Low Risk (04/02/2024)  Transportation Needs: No Transportation Needs (04/02/2024)  Utilities: Not At Risk (04/02/2024)  Alcohol  Screen: Low Risk (01/04/2024)  Depression (PHQ2-9): Low Risk (05/17/2023)  Financial Resource Strain: Low Risk (01/04/2024)  Physical Activity: Insufficiently Active (01/04/2024)  Social Connections: Socially Integrated (04/02/2024)  Stress: No Stress Concern Present (01/04/2024)  Tobacco Use: Medium Risk (04/13/2024)  Health Literacy: Adequate Health Literacy (05/17/2023)    Readmission Risk Interventions     No data to display         Mliss MICAEL Fass, RN, BSN  Trauma/Neuro ICU Case Manager [phone]

## 2024-04-15 NOTE — Progress Notes (Signed)
 Pt refused many of his meds overnight. Pt re-educated about the use/indication for each med. Larry Dawson verbalized understanding but maintained refusal.

## 2024-04-15 NOTE — Progress Notes (Signed)
 SLP Cancellation Note  Patient Details Name: GREYSEN DEVINO MRN: [id number] DOB: 1955-10-15   Cancelled treatment:       Reason Eval/Treat Not Completed: Patient at procedure or test/unavailable   Teniqua Marron, Consuelo Fitch 04/15/2024, 2:18 PM

## 2024-04-15 NOTE — Progress Notes (Addendum)
" ° ° °  Inpatient Rehabilitation Admissions Coordinator   Met with patient at bedside for rehab assessment. We discussed goals and expectations of a possible CIR admit.  He states his wife is retired and he has 3 local daughters that can assist when not working. I will contact wife to further discuss.  I will begin insurance Auth with [facility] medicare once he progresses further with therapy. Please call me with any questions.   Heron Leavell, RN, MSN Rehab Admissions Coordinator [phone]   I spoke with his wife by phone. She will need to discuss with their daughters caregiver supports and is concerned with home layout to be able to care for him after a CIR admit. His insurance has changed to [facility] 04/09/24 but she plans to switch back to [facility] medicare 2/1. She will follow up with me on rehab venue preference . I will not begin Auth at this time.  Heron Leavell, RN, MSN Rehab Admissions Coordinator [phone] 04/15/2024 12:35 PM  "

## 2024-04-15 NOTE — Consult Note (Signed)
 "     Physical Medicine and Rehabilitation Consult Reason for Consult: ICH Referring Physician: Sabas Brod, MD   HPI: Larry Dawson is a 69 y.o. 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 He is s/p craniotomy on 04/13/24. He has a PMH of admission on 12/8-12/10 with ICH, HTN, HLD, COPD< IBS, hernia repair, and T2DM. Physical Medicine & Rehabilitation was consulted to assess candidacy for CIR.  Patient is highly motivated as per therapy notes but is will increased weakness and visual neglect s/p craniotomy.     Home: Home Living Family/patient expects to be discharged to:: Private residence Living Arrangements: Spouse/significant other Available Help at Discharge: Family, Available 24 hours/day Type of Home: House Home Access: Stairs to enter [employer] of Steps: 4 Entrance Stairs-Rails: Right Home Layout: Two level, Bed/bath upstairs Alternate Level Stairs-Number of Steps: flight Alternate Level Stairs-Rails: Right Bathroom Shower/Tub: Health Visitor: Handicapped height Home Equipment: None Additional Comments: drives a tour bus  Functional History: Prior Function Prior Level of Function : Independent/Modified Independent, Working/employed, Driving Mobility Comments: indep, no AD, worked driving tour buses ADLs Comments: indep, working drives a tour bus Functional Status:  Mobility: Bed Mobility Overal bed mobility: Needs Assistance Bed Mobility: Supine to Sit Rolling: Mod assist Sidelying to sit: Mod assist Supine to sit: Mod assist General bed mobility comments: OOB in recliner Transfers Overall transfer level: Needs assistance Equipment used: Rolling walker (2 wheels) Transfers: Sit to/from Stand Sit to Stand: Mod assist, +2 physical assistance Bed to/from chair/wheelchair/BSC transfer type:: Stand pivot Stand pivot transfers: Max assist, +2 physical  assistance General transfer comment: OOB in recliner, OT Eval from sitting Ambulation/Gait Ambulation/Gait assistance: +2 safety/equipment, Max assist Gait Distance (Feet): 10 Feet Assistive device: Rolling walker (2 wheels), 1 person hand held assist Gait Pattern/deviations: Step-to pattern, Decreased stance time - right, Decreased step length - left, Decreased dorsiflexion - right, Ataxic, Decreased step length - right, Narrow base of support, Trunk flexed General Gait Details: unable this date Gait velocity: dec Gait velocity interpretation: <1.31 ft/sec, indicative of household ambulator    ADL: ADL Overall ADL's : Needs assistance/impaired Eating/Feeding: Minimal assistance, Sitting Grooming: Minimal assistance, Sitting Grooming Details (indicate cue type and reason): good seated balance, extra time and effort Upper Body Bathing: Moderate assistance Lower Body Bathing: Moderate assistance Upper Body Dressing : Moderate assistance, Sitting Upper Body Dressing Details (indicate cue type and reason): and extra time Lower Body Dressing: Total assistance, Sitting/lateral leans Toilet Transfer: Maximal assistance, +2 for safety/equipment, Stand-pivot, Rolling walker (2 wheels) Toilet Transfer Details (indicate cue type and reason): deferred Functional mobility during ADLs: Moderate assistance, Rolling walker (2 wheels), Maximal assistance, Cueing for safety, Cueing for sequencing General ADL Comments: decreased sensation and motor control of RUE, visual deficits decreased balance, strength.  Cognition: Cognition Orientation Level: Oriented X4 Cognition Arousal: Alert Behavior During Therapy: Flat affect   ROS +right sided weakness and hearing loss Past Medical History:  Diagnosis Date   Allergy    Anal fissure    COPD (chronic obstructive pulmonary disease) (HCC)    Albuterol  PRN.   Diabetes mellitus without complication (HCC)    Hydrocele    Hyperlipidemia     Hypertension    IBS (irritable bowel syndrome) 05/22/2013   per chart note-pt denied   Past Surgical History:  Procedure Laterality Date   APPLICATION OF CRANIAL NAVIGATION  04/13/2024   Procedure: COMPUTER-ASSISTED NAVIGATION, FOR CRANIAL PROCEDURE;  Surgeon:  [name] Dino HERO, MD;  Location: [facility] OR;  Service: Neurosurgery;;   CRANIOTOMY Left 04/13/2024   Procedure: Left Posterior Fossa Craniotomy for ARTERIO-VENOUS MALFORMATION DURAL COMPLEX;  Surgeon: [name] Dino HERO, MD;  Location: [facility] OR;  Service: Neurosurgery;  Laterality: Left;   HERNIA REPAIR  prior to 2013   HYDROCELE EXCISION Bilateral 11/23/2013   Procedure: HYDROCELECTOMY ADULT;  Surgeon: Alm GORMAN Fragmin, MD;  Location: [facility];  Service: Urology;  Laterality: Bilateral;   VASECTOMY  prior to 2013   Family History  Problem Relation Age of Onset   Diabetes Father    Hypertension Father    Diabetes Brother    Hypertension Brother    Diabetes Brother    Diabetes Brother    Social History:  reports that he has quit smoking. His smoking use included cigarettes. He has never used smokeless tobacco. He reports that he does not currently use alcohol . He reports that he does not use drugs. Allergies: Allergies[1] Medications Prior to Admission  Medication Sig Dispense Refill   Arginine 500 MG CAPS Take 1 capsule by mouth daily.     Ascorbic Acid (VITAMIN C PO) Take 1 tablet by mouth daily.     atenolol -chlorthalidone  (TENORETIC ) 50-25 MG tablet Take 1 tablet by mouth daily.     cetirizine  (ZYRTEC ) 10 MG tablet Take 1 tablet (10 mg total) by mouth daily. (Patient taking differently: Take 10 mg by mouth daily as needed for allergies.) 30 tablet 0   cholecalciferol (VITAMIN D) 1000 units tablet Take 1,000 Units by mouth daily.     glipiZIDE  (GLUCOTROL ) 5 MG tablet TAKE 1 TABLET BY MOUTH 2 TIMES A DAY BEFORE A MEAL 60 tablet 3   metFORMIN  (GLUCOPHAGE ) 500 MG tablet TAKE 1 TABLET BY MOUTH TWICE A DAY WITH A MEAL 180 tablet  3   Multiple Vitamins-Minerals (ZINC  PO) Take 1 tablet by mouth daily.     Omega-3 Fatty Acids (FISH OIL) 1000 MG CAPS Take 1 capsule by mouth daily.     omeprazole  (PRILOSEC) 20 MG capsule Take 20 mg by mouth daily.     sildenafil  (VIAGRA ) 100 MG tablet Take 0.5-1 tablets (50-100 mg total) by mouth daily as needed for erectile dysfunction. 5 tablet 11   tirzepatide  (MOUNJARO ) 5 MG/0.5ML Pen Inject 5 mg into the skin once a week. 6 mL 3   rosuvastatin  (CRESTOR ) 20 MG tablet Take 20 mg by mouth at bedtime. (Patient not taking: Reported on 04/01/2024)       Blood pressure (!) 154/81, pulse 82, temperature 98 F (36.7 C), temperature source Oral, resp. rate 17, height 5' 7 (1.702 m), weight 102.2 kg, SpO2 98%. Physical Exam Gen: no distress, normal appearing HEENT: oral mucosa pink and moist, NCAT Cardio: Reg rate Chest: normal effort, normal rate of breathing Abd: soft, non-distended Ext: no edema Psych: flat affect Skin: intact Neuro: Alert, impaired problem solving and awareness, +left sided visual neglect, delayed processing but can follow one step commands, 2/5 strength RLE, 3/5 strength RUE  Results for orders placed or performed during the hospital encounter of 04/01/24 (from the past 24 hours)  Glucose, capillary     Status: Abnormal   Collection Time: 04/14/24 11:17 AM  Result Value Ref Range   Glucose-Capillary 211 (H) 70 - 99 mg/dL  Glucose, capillary     Status: Abnormal   Collection Time: 04/14/24  4:56 PM  Result Value Ref Range   Glucose-Capillary 162 (H) 70 - 99 mg/dL  Glucose, capillary  Status: Abnormal   Collection Time: 04/14/24  9:19 PM  Result Value Ref Range   Glucose-Capillary 174 (H) 70 - 99 mg/dL  Basic metabolic panel with GFR     Status: Abnormal   Collection Time: 04/15/24  5:46 AM  Result Value Ref Range   Sodium 141 135 - 145 mmol/L   Potassium 4.1 3.5 - 5.1 mmol/L   Chloride 109 98 - 111 mmol/L   CO2 22 22 - 32 mmol/L   Glucose, Bld 122 (H)  70 - 99 mg/dL   BUN 18 8 - 23 mg/dL   Creatinine, Ser 9.00 0.61 - 1.24 mg/dL   Calcium  8.7 (L) 8.9 - 10.3 mg/dL   GFR, Estimated >39 >39 mL/min   Anion gap 10 5 - 15  CBC with Differential/Platelet     Status: Abnormal   Collection Time: 04/15/24  5:46 AM  Result Value Ref Range   WBC 15.1 (H) 4.0 - 10.5 K/uL   RBC 4.57 4.22 - 5.81 MIL/uL   Hemoglobin 12.7 (L) 13.0 - 17.0 g/dL   HCT 61.9 (L) 60.9 - 47.9 %   MCV 83.2 80.0 - 100.0 fL   MCH 27.8 26.0 - 34.0 pg   MCHC 33.4 30.0 - 36.0 g/dL   RDW 87.3 88.4 - 84.4 %   Platelets 256 150 - 400 K/uL   nRBC 0.0 0.0 - 0.2 %   Neutrophils Relative % 79 %   Neutro Abs 12.1 (H) 1.7 - 7.7 K/uL   Lymphocytes Relative 13 %   Lymphs Abs 1.9 0.7 - 4.0 K/uL   Monocytes Relative 7 %   Monocytes Absolute 1.1 (H) 0.1 - 1.0 K/uL   Eosinophils Relative 0 %   Eosinophils Absolute 0.0 0.0 - 0.5 K/uL   Basophils Relative 0 %   Basophils Absolute 0.0 0.0 - 0.1 K/uL   Immature Granulocytes 1 %   Abs Immature Granulocytes 0.08 (H) 0.00 - 0.07 K/uL  Glucose, capillary     Status: Abnormal   Collection Time: 04/15/24  8:04 AM  Result Value Ref Range   Glucose-Capillary 120 (H) 70 - 99 mg/dL   MR BRAIN WO CONTRAST Result Date: 04/14/2024 EXAM: MRI BRAIN WITHOUT CONTRAST 04/14/2024 04:01:43 AM TECHNIQUE: Multiplanar multisequence MRI of the head/brain was performed without the administration of intravenous contrast. COMPARISON: MR Head Without Contrast 04/11/2024 and earlier. CLINICAL HISTORY: 69 year old male with central pontine hemorrhage, recently postoperative from braintem hematoma vs cavernoma resection. FINDINGS: BRAIN AND VENTRICLES: Stable major intracranial vascular flow voids including dominant distal left vertebral artery which appears to be the primary supply to basilar. Postoperative changes in the left posterior fossa tracking to the left cerebellopontine angle and cystic resection there of previous relatively large and rounded nearly 3 cm pontine  hemorrhage / hematoma (series 8 image 10). Residual blood products but no measurable hematoma now on T1 or T2 weighted imaging. Regional brainstem and left cerebellar peduncle T2 and FLAIR hyperintense edema. Pontine expansion has regressed. Basilar cisterns remain patent. Susceptibility weighted imaging also suggestive of left hemisphere and parasagittal postoperative pneumocephalus. Heterogeneous T2 and FLAIR hyperintensity in the deep gray nuclei including bilateral thalami is stable. No convincing diffusion restriction when allowing for susceptibility artifact on DWI. Patchy and confluent widely scattered cerebral white matter T2 and FLAIR hyperintensity is stable. Trace intraventricular hemorrhage layering in the occipital horns is new. No ventriculomegaly. The sella is unremarkable. No midline shift. ORBITS: Rightward gaze deviation now. SINUSES AND MASTOIDS: Stable sphenoid sinus disease. Mastoid air cells  remain well aerated. BONES AND SOFT TISSUES: Evidence of suboccipital craniotomy now. Postoperative changes to the left scalp. Normal marrow signal. IMPRESSION: 1. No adverse features status post left suboccipital craniotomy and brainstem hematoma resection. Expected postoperative changes including probable pneumocephalus. 2. No new intracranial abnormality. 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 Electronically signed by: Helayne Hurst MD 04/14/2024 06:29 AM EST RP Workstation: [id number]   DG Abd Portable 1V Result Date: 04/13/2024 EXAM: 1 VIEW XRAY OF THE ABDOMEN 04/13/2024 04:22:48 PM COMPARISON: None available. CLINICAL HISTORY: Encounter for nasogastric (NG) tube placement FINDINGS: LINES, TUBES AND DEVICES: Nasogastric tube in place with tip overlying the distal esophagus. BOWEL: Nonobstructive bowel gas pattern. SOFT TISSUES: No abnormal calcifications. BONES: No acute fracture. IMPRESSION: 1. Nasogastric tube tip overlies the distal esophagus; recommend advancement into the  stomach. Electronically signed by: Greig Pique MD 04/13/2024 05:19 PM EST RP Workstation: [id number]    Assessment/Plan: Diagnosis: Brainstem cavernoma s/p craniotomy Does the need for close, 24 hr/day medical supervision in concert with the patient's rehab needs make it unreasonable for this patient to be served in a less intensive setting? Yes Co-Morbidities requiring supervision/potential complications:  1) class 2 obesity: provide dietary education 2) COPD: monitor respiratory saturations 3) Type 2 diabetes: monitor CBGs 4) HLD 5) HTN: monitor blood pressures Due to bladder management, bowel management, safety, skin/wound care, disease management, medication administration, pain management, and patient education, does the patient require 24 hr/day rehab nursing? Yes Does the patient require coordinated care of a physician, rehab nurse, therapy disciplines of PT, OT, SLP to address physical and functional deficits in the context of the above medical diagnosis(es)? Yes Addressing deficits in the following areas: balance, endurance, locomotion, strength, transferring, bowel/bladder control, bathing, dressing, feeding, grooming, toileting, cognition, speech, and psychosocial support Can the patient actively participate in an intensive therapy program of at least 3 hrs of therapy per day at least 5 days per week? Yes The potential for patient to make measurable gains while on inpatient rehab is excellent Anticipated functional outcomes upon discharge from inpatient rehab are supervision  with PT, supervision with OT, supervision with SLP. Estimated rehab length of stay to reach the above functional goals is: 14-16 days Anticipated discharge destination: Home Overall Rehab/Functional Prognosis: excellent  POST ACUTE RECOMMENDATIONS: This patient's condition is appropriate for continued rehabilitative care in the following setting: CIR Patient has agreed to participate in recommended program.  Yes Note that insurance prior authorization may be required for reimbursement for recommended care.   I have personally performed a face to face diagnostic evaluation of this patient. Additionally, I have examined the patient's medical record including any pertinent labs and radiographic images.    Thanks,  Sven SHAUNNA Elks, MD 04/15/2024     [1]  Allergies Allergen Reactions   Ace Inhibitors Swelling    Suspected angioedema   Penicillins Anaphylaxis, Hives, Rash and Other (See Comments)    Throat swells   Latex Hives, Swelling and Rash   "

## 2024-04-15 NOTE — Progress Notes (Signed)
 Physical Therapy Treatment Patient Details Name: Larry Dawson MRN: [id number] DOB: 1956-04-08 Today's Date: 04/15/2024   History of Present Illness 69 yo M adm 04/01/24 with Rt weakness with increased pontine hemorrhage consistent with cavernoma. 04/13/24 craniotomy. PMHx: admission 12/8-12/10 with ICH. HTN, HLD, COPD, IBS, Hernia repair, T2DM    PT Comments  Pt very motivated to work with therapy and eager to fight, I will overcome this. Pt states. Pt continues with R hemiparesis, R inattention, impaired vision, impaired R UE and LE coordination, impaired balance, and requires assistx2 for OOB mobility. Pt to benefit from intensive inpatient rehab program > 3 hrs a day to address above deficits and maximize functional recovery. Acute PT to cont to follow.   If plan is discharge home, recommend the following: A lot of help with bathing/dressing/bathroom;Assist for transportation;Help with stairs or ramp for entrance;Assistance with cooking/housework;Two people to help with walking and/or transfers   Can travel by private Theme Park Manager (measurements PT);Wheelchair cushion (measurements PT);BSC/3in1    Recommendations for Other Services Rehab consult     Precautions / Restrictions Precautions Precautions: Fall;Other (comment) Recall of Precautions/Restrictions: Impaired Precaution/Restrictions Comments: Rt weakness, Rt hearing loss, Rt gaze Restrictions Weight Bearing Restrictions Per Provider Order: No     Mobility  Bed Mobility               General bed mobility comments: pt received sitting EOB with RN    Transfers Overall transfer level: Needs assistance Equipment used: Rolling walker (2 wheels) Transfers: Sit to/from Stand Sit to Stand: Mod assist, +2 physical assistance Stand pivot transfers: Max assist, +2 physical assistance         General transfer comment: pt requiring blocking of R knee, maxA to advance, tactile  cues at posterior R hip to promote extension of trunk    Ambulation/Gait               General Gait Details: unable this date   Stairs             Wheelchair Mobility     Tilt Bed    Modified Rankin (Stroke Patients Only) Modified Rankin (Stroke Patients Only) Pre-Morbid Rankin Score: No significant disability Modified Rankin: Severe disability     Balance Overall balance assessment: Needs assistance Sitting-balance support: No upper extremity supported, Feet supported Sitting balance-Leahy Scale: Poor Sitting balance - Comments: posterior rt lean without UB support, LUE support to achive midline Postural control: Posterior lean, Right lateral lean Standing balance support: Bilateral upper extremity supported, During functional activity Standing balance-Leahy Scale: Zero Standing balance comment: reliant on external support and Rt knee block                            Communication Communication Communication: No apparent difficulties Factors Affecting Communication:  (pt reports his hearing is a little worse than yesterday)  Cognition Arousal: Alert Behavior During Therapy: Flat affect   PT - Cognitive impairments: Safety/Judgement, Problem solving, Awareness                       PT - Cognition Comments: decreased awareness of deficits, safety and LOB, Rt gaze and repeatedly stating he can look left despite inability to cross midline Following commands: Impaired Following commands impaired: Follows one step commands with increased time    Cueing Cueing Techniques: Verbal cues, Tactile cues, Gestural cues  Exercises General  Exercises - Lower Extremity Long Arc Quad: Right, 10 reps, Seated, AAROM (with hold at end range) Hip Flexion/Marching: AROM, Right, 10 reps, Seated Heel Raises: AROM, Both, 10 reps, Seated Other Exercises Other Exercises: worked on sit to stands from medical illustrator with increased WBing to the R to promote  strengthening    General Comments General comments (skin integrity, edema, etc.): VSS      Pertinent Vitals/Pain Pain Assessment Pain Assessment: No/denies pain    Home Living                          Prior Function            PT Goals (current goals can now be found in the care plan section) Acute Rehab PT Goals Patient Stated Goal: return to walking and home PT Goal Formulation: With patient Time For Goal Achievement: 04/28/24 Potential to Achieve Goals: Fair Progress towards PT goals: Progressing toward goals    Frequency    Min 4X/week      PT Plan      Co-evaluation              AM-PAC PT 6 Clicks Mobility   Outcome Measure  Help needed turning from your back to your side while in a flat bed without using bedrails?: A Lot Help needed moving from lying on your back to sitting on the side of a flat bed without using bedrails?: A Lot Help needed moving to and from a bed to a chair (including a wheelchair)?: Total Help needed standing up from a chair using your arms (e.g., wheelchair or bedside chair)?: Total Help needed to walk in hospital room?: Total Help needed climbing 3-5 steps with a railing? : Total 6 Click Score: 8    End of Session Equipment Utilized During Treatment: Gait belt Activity Tolerance: Patient tolerated treatment well Patient left: in chair;with call bell/phone within reach;with chair alarm set;with nursing/sitter in room Nurse Communication: Mobility status PT Visit Diagnosis: Unsteadiness on feet (R26.81);Muscle weakness (generalized) (M62.81);Difficulty in walking, not elsewhere classified (R26.2);Hemiplegia and hemiparesis;Other abnormalities of gait and mobility (R26.89) Hemiplegia - Right/Left: Right Hemiplegia - dominant/non-dominant: Dominant Hemiplegia - caused by: Nontraumatic intracerebral hemorrhage     Time: 1200-1220 PT Time Calculation (min) (ACUTE ONLY): 20 min  Charges:    $Therapeutic Exercise:  8-22 mins PT General Charges $$ ACUTE PT VISIT: 1 Visit                     Norene Ames, PT, DPT Acute Rehabilitation Services Secure chat preferred Office #: [phone]    Norene CHRISTELLA Ames 04/15/2024, 1:57 PM

## 2024-04-16 LAB — CBC WITH DIFFERENTIAL/PLATELET
Abs Immature Granulocytes: 0.05 K/uL (ref 0.00–0.07)
Basophils Absolute: 0.1 K/uL (ref 0.0–0.1)
Basophils Relative: 1 %
Eosinophils Absolute: 0.1 K/uL (ref 0.0–0.5)
Eosinophils Relative: 1 %
HCT: 40.4 % (ref 39.0–52.0)
Hemoglobin: 13.3 g/dL (ref 13.0–17.0)
Immature Granulocytes: 1 %
Lymphocytes Relative: 26 %
Lymphs Abs: 2.3 K/uL (ref 0.7–4.0)
MCH: 27.7 pg (ref 26.0–34.0)
MCHC: 32.9 g/dL (ref 30.0–36.0)
MCV: 84.2 fL (ref 80.0–100.0)
Monocytes Absolute: 0.9 K/uL (ref 0.1–1.0)
Monocytes Relative: 9 %
Neutro Abs: 5.8 K/uL (ref 1.7–7.7)
Neutrophils Relative %: 62 %
Platelets: 244 K/uL (ref 150–400)
RBC: 4.8 MIL/uL (ref 4.22–5.81)
RDW: 12.6 % (ref 11.5–15.5)
WBC: 9.2 K/uL (ref 4.0–10.5)
nRBC: 0 % (ref 0.0–0.2)

## 2024-04-16 LAB — BASIC METABOLIC PANEL WITH GFR
Anion gap: 9 (ref 5–15)
BUN: 17 mg/dL (ref 8–23)
CO2: 24 mmol/L (ref 22–32)
Calcium: 8.8 mg/dL — ABNORMAL LOW (ref 8.9–10.3)
Chloride: 107 mmol/L (ref 98–111)
Creatinine, Ser: 1.02 mg/dL (ref 0.61–1.24)
GFR, Estimated: >60 mL/min
Glucose, Bld: 100 mg/dL — ABNORMAL HIGH (ref 70–99)
Potassium: 3.6 mmol/L (ref 3.5–5.1)
Sodium: 139 mmol/L (ref 135–145)

## 2024-04-16 LAB — GLUCOSE, CAPILLARY
Glucose-Capillary: 117 mg/dL — ABNORMAL HIGH (ref 70–99)
Glucose-Capillary: 138 mg/dL — ABNORMAL HIGH (ref 70–99)
Glucose-Capillary: 100 mg/dL — ABNORMAL HIGH (ref 70–99)
Glucose-Capillary: 104 mg/dL — ABNORMAL HIGH (ref 70–99)

## 2024-04-16 MED ORDER — AMLODIPINE BESYLATE 5 MG PO TABS
5.0000 mg | ORAL_TABLET | Freq: Every day | ORAL | Status: DC
  Administered 2024-04-16 – 2024-04-20 (×5): 5 mg via ORAL
  Filled 2024-04-16 (×5): qty 1

## 2024-04-16 MED ORDER — ACETAMINOPHEN 325 MG PO TABS
650.0000 mg | ORAL_TABLET | Freq: Four times a day (QID) | ORAL | Status: DC | PRN
  Administered 2024-04-16 – 2024-04-20 (×4): 650 mg via ORAL
  Filled 2024-04-16 (×4): qty 2

## 2024-04-16 NOTE — Progress Notes (Signed)
 Physical Therapy Treatment Patient Details Name: Larry Dawson MRN: [id number] DOB: 1955/11/13 Today's Date: 04/16/2024   History of Present Illness 69 yo M adm 04/01/24 with Rt weakness with increased pontine hemorrhage consistent with cavernoma. 04/13/24 craniotomy. PMHx: admission 12/8-12/10 with ICH. HTN, HLD, COPD, IBS, Hernia repair, T2DM    PT Comments  Pt pleasant and eager to mobilize. Pt with grossly 2/5 RLE strength this date with ataxia and buckling in standing. Pt with left lean in standing, Rt gaze and grossly 2-/5 RUE strength with impaired grip and movement. Pt did well standing and transferring with stedy and continues to need max +2 assist for standing and stepping without use of stedy. Encouraged OOB with nursing staff and will continue to follow. Patient will benefit from intensive inpatient follow-up therapy, >3 hours/day  137/79 HR 84 SPO2 98% on RA    If plan is discharge home, recommend the following: A lot of help with bathing/dressing/bathroom;Assist for transportation;Help with stairs or ramp for entrance;Assistance with cooking/housework;Two people to help with walking and/or transfers   Can travel by private Theme Park Manager (measurements PT);Wheelchair cushion (measurements PT);BSC/3in1    Recommendations for Other Services Rehab consult     Precautions / Restrictions Precautions Precautions: Fall;Other (comment) Recall of Precautions/Restrictions: Impaired Precaution/Restrictions Comments: Rt weakness, Rt hearing loss, Rt gaze     Mobility  Bed Mobility Overal bed mobility: Needs Assistance Bed Mobility: Supine to Sit     Supine to sit: Min assist     General bed mobility comments: pt needing min assist to elevate trunk to sitting with pivot to Rt side with assist to clear RLE and attend to RUE placement    Transfers Overall transfer level: Needs assistance   Transfers: Sit to/from Stand Sit to Stand:  Min assist, Mod assist, +2 physical assistance           General transfer comment: min assist to stand to stedy with cues for hand placement, safety and sequence. Mod +2 to stand from recliner x 4 trials with use of rail in hall x 1 and armrest x 3. Pt with Rt knee blocked. in static standing worked on weight shift to left and trunk extension with pt unable to sense midline. additional trial at rail for short stepping trial. Transfer bed to chair via stedy Transfer via Lift Equipment: Stedy  Ambulation/Gait Ambulation/Gait assistance: Max assist, +2 physical assistance Gait Distance (Feet): 3 Feet Assistive device:  (hall railing) Gait Pattern/deviations: Step-to pattern, Decreased stance time - right, Decreased step length - left, Decreased dorsiflexion - right, Ataxic, Decreased step length - right, Narrow base of support, Trunk flexed       General Gait Details: max assist to control balance, RLE blocking and assisting with placing RLE as pt either dragging or exaggerated stepping, +2 for safety with very close chair follow, use of Rail in hall on [employer] Mobility     Tilt Bed    Modified Rankin (Stroke Patients Only)       Balance Overall balance assessment: Needs assistance Sitting-balance support: No upper extremity supported, Feet supported Sitting balance-Leahy Scale: Poor Sitting balance - Comments: min assist for sitting balance with and without single UE support, tendency toward posterior bias this date   Standing balance support: Bilateral upper extremity supported, During functional activity Standing balance-Leahy Scale: Zero Standing balance comment: reliant on  external support and Rt knee block                            Communication Communication Communication: No apparent difficulties  Cognition Arousal: Alert Behavior During Therapy: Flat affect, Impulsive   PT - Cognitive impairments: Safety/Judgement,  Problem solving, Awareness                       PT - Cognition Comments: pt impulsive at times attempting to move to EOB or rise prior to therapist moving lines or rail Following commands: Impaired Following commands impaired: Follows one step commands with increased time    Cueing Cueing Techniques: Verbal cues, Tactile cues, Gestural cues  Exercises General Exercises - Lower Extremity Long Arc Quad: Right, 10 reps, Seated, AROM    General Comments        Pertinent Vitals/Pain Pain Assessment Pain Assessment: 0-10 Pain Score: 6  Pain Location: Rt hip and ankle Pain Descriptors / Indicators: Guarding, Cramping Pain Intervention(s): Limited activity within patient's tolerance, Monitored during session, Repositioned, Patient requesting pain meds-RN notified    Home Living                          Prior Function            PT Goals (current goals can now be found in the care plan section) Progress towards PT goals: Progressing toward goals    Frequency    Min 4X/week      PT Plan      Co-evaluation              AM-PAC PT 6 Clicks Mobility   Outcome Measure  Help needed turning from your back to your side while in a flat bed without using bedrails?: A Little Help needed moving from lying on your back to sitting on the side of a flat bed without using bedrails?: A Lot Help needed moving to and from a bed to a chair (including a wheelchair)?: Total Help needed standing up from a chair using your arms (e.g., wheelchair or bedside chair)?: Total Help needed to walk in hospital room?: Total Help needed climbing 3-5 steps with a railing? : Total 6 Click Score: 9    End of Session Equipment Utilized During Treatment: Gait belt Activity Tolerance: Patient tolerated treatment well Patient left: in chair;with call bell/phone within reach;with chair alarm set;with nursing/sitter in room Nurse Communication: Mobility status;Need for lift  equipment PT Visit Diagnosis: Unsteadiness on feet (R26.81);Muscle weakness (generalized) (M62.81);Difficulty in walking, not elsewhere classified (R26.2);Hemiplegia and hemiparesis;Other abnormalities of gait and mobility (R26.89) Hemiplegia - Right/Left: Right Hemiplegia - dominant/non-dominant: Dominant Hemiplegia - caused by: Nontraumatic intracerebral hemorrhage     Time: 0807-0842 PT Time Calculation (min) (ACUTE ONLY): 35 min  Charges:    $Therapeutic Activity: 23-37 mins PT General Charges $$ ACUTE PT VISIT: 1 Visit                     Lenoard SQUIBB, PT Acute Rehabilitation Services Office: [phone]    Lenoard NOVAK Karolina Zamor 04/16/2024, 9:10 AM

## 2024-04-16 NOTE — PMR Pre-admission (Signed)
 PMR Admission Coordinator Pre-Admission Assessment  Patient: Larry Dawson is an 69 y.o., male MRN: [id number] DOB: 09/27/1955 Height: 5' 7 (170.2 cm) Weight: 102.2 kg  Insurance Information HMO: yes    PPO:      PCP:      IPA:      80/20:      OTHER:  PRIMARY: [employer]      Policy#: [id number] ; Medicare [id number]     Subscriber: pt CM Name: TBD      Phone#: TBD     Fax#: [phone] Pre-Cert#: [id number] Auth for CIR via fax on 04/20/24 from [facility] for admit 04/20/24. Per fax, continued stay updates not required. Discharge notice due within 24 hours of discharge to [phone].        Employer:  Benefits:  Phone #: [phone]     Name: 1/8 Eff. Date: 04/09/24     Deduct: none      Out of Pocket Max: $3950      Life Max: none CIR: $295 co pay per day days 1 until 5      SNF: $20 co pay per day days 1 until 20; $218 co pay per day days 21 until 100 Outpatient: $25 per visit     Co-Pay:  Home Health: 100%      Co-Pay:  DME: 80%     Co-Pay: 20% Providers: in network  SECONDARY: none      Policy#:      Phone#:   Artist:       Phone#:   The Best Boy for patients in Inpatient Rehabilitation Facilities with attached Privacy Act Statement-Health Care Records was provided and verbally reviewed with: Patient and Family  Emergency Contact Information Contact Information     Name Relation Home Work Mobile   Elison,Cassie Spouse [phone]  [phone]   leo, fray Daughter   [phone]      Other Contacts   None on File    Current Medical History  Patient Admitting Diagnosis: Cerebral cavernous malformation  History of Present Illness: 69 yo male with history of COPD, HTN, HLD, type 2 DM. 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 Presented to ED on 04/01/24 with worsening right upper and lower extremity weakness/numbness, ataxia, diplopia, slurring speech and difficulty  swallowing.   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 Postoperative ICU management  muscle spasms of left neck.Steroids, tylenol  and muscle relaxant.   CBGS well controlled with SSI and CBGS. Continue atenolol  for HTN. Heparin  for DVT prophylaxis.    Patient's medical record from [facility] has been reviewed by the rehabilitation admission coordinator and physician.  Past Medical History  Past Medical History:  Diagnosis Date   Allergy    Anal fissure    COPD (chronic obstructive pulmonary disease) (HCC)    Albuterol  PRN.   Diabetes mellitus without complication (HCC)    Hydrocele    Hyperlipidemia    Hypertension    IBS (irritable bowel syndrome) 05/22/2013   per chart note-pt denied   Has the patient had major surgery during 100 days prior to admission? Yes  Family History   family history includes Diabetes in his brother, brother, brother, and father; Hypertension in his brother and father.  Current Medications Current Medications[1]  Patients Current Diet:  Diet Order             Diet regular Room service appropriate? Yes with  Assist; Fluid consistency: Thin  Diet effective now                  Precautions / Restrictions Precautions Precautions: Fall, Other (comment) Precaution/Restrictions Comments: Rt weakness, Rt hearing loss, Rt gaze Other Brace: resting hand splint for night time use R UE Restrictions Weight Bearing Restrictions Per Provider Order: No   Has the patient had 2 or more falls or a fall with injury in the past year? 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 Independent  Indoor Mobility: Did the patient need assistance with walking from room to room (with or without device)? Independent  Stairs: Did  the patient need assistance with internal or external stairs (with or without device)? Independent  Functional Cognition: Did the patient need help planning regular tasks such as shopping or remembering to take medications? Independent  Patient Information Are you of Hispanic, Latino/a,or Spanish origin?: A. No, not of Hispanic, Latino/a, or Spanish origin What is your race?: B. Black or African American Do you need or want an interpreter to communicate with a doctor or health care staff?: 0. No  Patient's Response To:  Health Literacy and Transportation Is the patient able to respond to health literacy and transportation needs?: Yes Health Literacy - How often do you need to have someone help you when you read instructions, pamphlets, or other written material from your doctor or pharmacy?: Never In the past 12 months, has lack of transportation kept you from medical appointments or from getting medications?: No In the past 12 months, has lack of transportation kept you from meetings, work, or from getting things needed for daily living?: No  Home Assistive Devices / Equipment Home Equipment: None  Prior Device Use: Indicate devices/aids used by the patient prior to current illness, exacerbation or injury? None of the above  Current Functional Level Cognition  Orientation Level: Oriented X4    Extremity Assessment (includes Sensation/Coordination)  Upper Extremity Assessment: Right hand dominant, RUE deficits/detail RUE Deficits / Details: grossly 3-/5, poor shoulder flexion but able to abduct with increased effort.  some increased flexor tone.  Difficutly using UE functionally with poor sensation and coordination. using UE as a stabilizer RUE Sensation: decreased light touch, decreased proprioception RUE Coordination: decreased fine motor, decreased gross motor  Lower Extremity Assessment: Defer to PT evaluation RLE Deficits / Details: grossly 3/5, impaired coordination/ataxia, 75%  sensory deficit RLE Sensation: decreased light touch, decreased proprioception RLE Coordination: decreased gross motor    ADLs  Overall ADL's : Needs assistance/impaired Eating/Feeding: Minimal assistance, Sitting Grooming: Contact guard assist, Wash/dry face, Sitting Grooming Details (indicate cue type and reason): perched on stedy at sink Upper Body Bathing: Moderate assistance Lower Body Bathing: Moderate assistance Upper Body Dressing : Sitting, Moderate assistance Upper Body Dressing Details (indicate cue type and reason): and extra time Lower Body Dressing: Total assistance, +2 for physical assistance, Sit to/from stand Lower Body Dressing Details (indicate cue type and reason): assist for clothing mgmt, but standing with contact guard today in stedy Toilet Transfer: Total assistance, +2 for physical assistance Toilet Transfer Details (indicate cue type and reason): simulated using stedy to recliner Functional mobility during ADLs: Maximal assistance, +2 for physical assistance, Contact guard assist, Rolling walker (2 wheels), Cueing for sequencing, Cueing for safety (stedy) General ADL Comments: returned to check splint to R UE    Mobility  Overal bed mobility: Needs Assistance Bed Mobility: Rolling, Sidelying to Sit Rolling: Mod assist Sidelying to sit: Mod assist Supine to sit: Contact guard Sit to supine: Mod assist General bed mobility comments: rolling towards L side and transitioning from sidelying to sitting.  Needing increased assist compared to exiting on R side of bed    Transfers  Overall transfer level: Needs assistance Equipment used: Standard walker, Ambulation equipment used Transfers: Sit to/from Stand Sit to Stand: Max assist, +2 physical assistance, Contact guard assist Bed to/from chair/wheelchair/BSC transfer type:: Via Lift equipment Stand pivot transfers: Max assist, +2 physical assistance Transfer via Lift Equipment: Stedy General transfer comment:  pt cueing to scoot to EOB prior to transfer, worked on pushing from bed and forward lean but with each transfer into standing pt pushing posteriorly to L and unable to maintain R hand on bed or RW.  Needing max assist +2 to fully power up and steady from EOB with RW.  Using stedy, pulling from front bar able to stand with contact guard.    Ambulation / Gait / Stairs / Wheelchair Mobility  Ambulation/Gait Ambulation/Gait assistance: Max assist, +2 safety/equipment Gait Distance (Feet): 3 Feet (x7 bouts of stepping anterior <> posterior 2-3x each bout while standing anterior to chair) Assistive device: Rolling walker (2 wheels), 1 person hand held assist Gait Pattern/deviations: Step-to pattern, Decreased stance time - right, Decreased step length - left, Decreased dorsiflexion - right, Ataxic, Decreased step length - right, Trunk flexed, Decreased weight shift to right, Decreased weight shift to left, Knees buckling General Gait Details: ACE wrap applied to R ankle to facilitate dorsiflexion and prevent inversion during standing mobility, removed end of session. Practiced anterior <> posterior stepping while standing anterior to recliner. Pt stepping anterior <> posterior 2-3x each bout, x7 bouts total (x4 with RW, x3 with HHA). This was primarily with his L leg while R leg was provided verbal and tactile cues to extend during stance phase. Tactile cues provided for weight shifting bil and to extend hips and trunk for upright balance. x2 steps anterior <> posterior with R leg with pt needing maxA to lift and advance R leg each time. MaxA for balance each gait training bout. Gait velocity: dec Gait velocity interpretation: <1.31 ft/sec, indicative of household ambulator    Posture / Balance Dynamic Sitting Balance Sitting balance - Comments: statically contact guard. Balance Overall balance assessment: Needs assistance Sitting-balance support: Bilateral upper extremity supported, Feet  supported Sitting balance-Leahy Scale: Fair Sitting balance - Comments: statically contact guard. Postural control: Posterior lean, Right lateral lean Standing balance support: Bilateral upper extremity supported, During functional activity Standing balance-Leahy Scale: Poor Standing balance comment: relies on external and UE support    Special considerations/life events  Fall precautions   Previous Home Environment  Living Arrangements: Spouse/significant other  Lives With: Spouse Available Help at Discharge:  (spouse and 3 dtrs can provide 24/7 assist) Type of Home: House Home Layout: Two level, Bed/bath upstairs Alternate Level Stairs-Rails: Right Alternate Level Stairs-Number of Steps: flight Home Access: Stairs to enter Entrance Stairs-Rails: Right Entrance Stairs-Number of Steps: 4 Bathroom Shower/Tub: Health Visitor: Handicapped height Bathroom Accessibility: Yes How Accessible: Accessible via walker Home Care Services: No Additional Comments: drives a tour bus  Discharge Living Setting Plans for Discharge Living Setting: Patient's home, House, Lives with (comment) (wife) Type of Home at Discharge: House Discharge Home Layout: Two level, Bed/bath upstairs Alternate Level Stairs-Rails: Right Alternate Level Stairs-Number of Steps: flight Discharge Home Access:  Stairs to enter Entrance Stairs-Rails: Right Entrance Stairs-Number of Steps: 4 Discharge Bathroom Shower/Tub: Walk-in shower Discharge Bathroom Toilet: Handicapped height Discharge Bathroom Accessibility: Yes How Accessible: Accessible via walker Does the patient have any problems obtaining your medications?: No  Social/Family/Support Systems Patient Roles: Spouse (employee) Contact Information: wife, Cassie Anticipated Caregiver: wife and 3 dtrs Anticipated Caregiver's Contact Information: see contacts Ability/Limitations of Caregiver: no limitations Caregiver Availability:  24/7 Discharge Plan Discussed with Primary Caregiver: Yes Is Caregiver In Agreement with Plan?: Yes Does Caregiver/Family have Issues with Lodging/Transportation while Pt is in Rehab?: No  Goals Patient/Family Goal for Rehab: supervision with PT, OT and SLP Expected length of stay: ELOS 14 to 16 days Pt/Family Agrees to Admission and willing to participate: Yes Program Orientation Provided & Reviewed with Pt/Caregiver Including Roles  & Responsibilities: Yes  Decrease burden of Care through IP rehab admission: n/a  Possible need for SNF placement upon discharge: not anticipated  Patient Condition: This patient's condition remains as documented in the consult dated 04/15/24, in which the Rehabilitation Physician determined and documented that the patient's condition is appropriate for intensive rehabilitative care in an inpatient rehabilitation facility. Will admit to inpatient rehab today.  Preadmission Screen Completed By:  Alison Heron Lot, RN MSN1/03/2025 12:26 PM ______________________________________________________________________   Discussed status with Dr. Carilyn on 04/20/2024. at now and received approval for admission today.  Admission Coordinator:  Alison Heron Lot, RN MSN time 12:26 PM/Date 04/20/2024    Assessment/Plan: Diagnosis: brainstem cavernoma Does the need for close, 24 hr/day Medical supervision in concert with the patient's rehab needs make it unreasonable for this patient to be served in a less intensive setting? Yes Co-Morbidities requiring supervision/potential complications: COPD,DM 2, HTN Due to bladder management, bowel management, safety, skin/wound care, disease management, medication administration, pain management, and patient education, does the patient require 24 hr/day rehab nursing? Yes Does the patient require coordinated care of a physician, rehab nurse, PT, OT, and SLP to address physical and functional deficits in the context of the  above medical diagnosis(es)? Yes Addressing deficits in the following areas: balance, endurance, locomotion, strength, transferring, bowel/bladder control, bathing, dressing, feeding, grooming, toileting, cognition, speech, language, swallowing, and psychosocial support Can the patient actively participate in an intensive therapy program of at least 3 hrs of therapy 5 days a week? Yes The potential for patient to make measurable gains while on inpatient rehab is good Anticipated functional outcomes upon discharge from inpatient rehab: min assist PT, min assist OT, supervision SLP Estimated rehab length of stay to reach the above functional goals is: 14-16 d Anticipated discharge destination: Home 10. Overall Rehab/Functional Prognosis: good   MD Signature: Prentice CHARLENA Carilyn M.D. [facility] Fellow Am Acad of Phys Med and Rehab Diplomate Am Board of Electrodiagnostic Med Fellow Am Board of Interventional Pain      [1]  Current Facility-Administered Medications:    acetaminophen  (TYLENOL ) tablet 650 mg, 650 mg, Oral, Q6H PRN, Drusilla Sabas RAMAN, MD, 650 mg at 04/20/24 0541   alum & mag hydroxide-simeth (MAALOX/MYLANTA) 200-200-20 MG/5ML suspension 30 mL, 30 mL, Oral, Q4H PRN, Claudene Toribio BROCKS, MD   amLODipine  (NORVASC ) tablet 5 mg, 5 mg, Oral, Daily, Drusilla, Sabas RAMAN, MD, 5 mg at 04/20/24 0813   artificial tears ophthalmic solution 1 drop, 1 drop, Left Eye, Q2H while awake, Claudene Toribio BROCKS, MD, 1 drop at 04/19/24 1739   atenolol  (TENORMIN ) tablet 50 mg, 50 mg, Oral, Daily, Claudene Toribio BROCKS, MD, 50 mg at 04/20/24 [phone]  docusate (COLACE) 50 MG/5ML liquid 100 mg, 100 mg, Oral, BID, Claudene Toribio BROCKS, MD, 100 mg at 04/20/24 0813   feeding supplement (ENSURE PLUS HIGH PROTEIN) liquid 237 mL, 237 mL, Oral, BID BM, Claudene Toribio BROCKS, MD, 237 mL at 04/20/24 0819   heparin  injection 5,000 Units, 5,000 Units, Subcutaneous, Q12H, Claudene Toribio BROCKS, MD, 5,000 Units at 04/20/24 0813   hydrALAZINE   (APRESOLINE ) injection 5 mg, 5 mg, Intravenous, Q4H PRN, Rathore, Vasundhra, MD   insulin  aspart (novoLOG ) injection 0-9 Units, 0-9 Units, Subcutaneous, TID AC & HS, Claudene Toribio BROCKS, MD, 1 Units at 04/20/24 1223   insulin  glargine (LANTUS ) injection 8 Units, 8 Units, Subcutaneous, QHS, Drusilla Sabas RAMAN, MD, 8 Units at 04/19/24 2225   labetalol  (NORMODYNE ) injection 10-40 mg, 10-40 mg, Intravenous, Q10 min PRN, Janjua, Rashid M, MD, 20 mg at 04/15/24 9082   meclizine  (ANTIVERT ) tablet 25 mg, 25 mg, Oral, TID PRN, Claudene Toribio BROCKS, MD, 25 mg at 04/15/24 9182   metFORMIN  (GLUCOPHAGE ) tablet 500 mg, 500 mg, Oral, BID WC, Claudene Toribio BROCKS, MD, 500 mg at 04/20/24 9186   ondansetron  (ZOFRAN ) tablet 4 mg, 4 mg, Oral, Q4H PRN **OR** ondansetron  (ZOFRAN ) injection 4 mg, 4 mg, Intravenous, Q4H PRN, Claudene Toribio BROCKS, MD   Oral care mouth rinse, 15 mL, Mouth Rinse, PRN, Danford, Lonni SQUIBB, MD   pantoprazole  (PROTONIX ) EC tablet 40 mg, 40 mg, Oral, QHS, Drusilla, Gagan S, MD, 40 mg at 04/19/24 2211   phenol (CHLORASEPTIC) mouth spray 1 spray, 1 spray, Mouth/Throat, PRN, Claudene Toribio BROCKS, MD, 1 spray at 04/13/24 1706   polyethylene glycol (MIRALAX  / GLYCOLAX ) packet 17 g, 17 g, Oral, Daily, Samtani, Jai-Gurmukh, MD, 17 g at 04/19/24 0856   promethazine  (PHENERGAN ) tablet 12.5-25 mg, 12.5-25 mg, Oral, Q4H PRN, Claudene Toribio BROCKS, MD   rosuvastatin  (CRESTOR ) tablet 20 mg, 20 mg, Oral, QHS, Claudene Toribio BROCKS, MD, 20 mg at 04/19/24 2211   senna-docusate (Senokot-S) tablet 1 tablet, 1 tablet, Oral, QHS, Claudene Toribio BROCKS, MD, 1 tablet at 04/19/24 2217

## 2024-04-16 NOTE — Progress Notes (Signed)
 [facility]  PROGRESS NOTE  Larry Dawson FMW:[id number] DOB: 1955/11/15 DOA: 04/01/2024 PCP: Larry Emil Schanz, MD   Brief HPI:   69 year old man w/ hx of COPD, HTN, HLD presenting with imbalance and ill-defined feelings of numbness throughout head. 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 He arrives to ICU for further management.     Assessment/Plan:   Symptomatic brainstem covernoma  -S/P posterior fossa cranitomy and resection on 04/14/23 by Dr. Janjua  Larry Dawson with NovoLog  -CBG well-controlled   GERD - Continue pantoprazole   Hypertension Keep SBP below 150 .  IV hydralazine  PRN ordered. Continue atenolol  - Will add amlodipine  5 mg daily    COPD Stable, no signs of acute exacerbation.  Does not appear to be on inhalers at home.   Type 2 diabetes Hemoglobin A1c 7.2 on 01/06/2024.   Placed on sensitive sliding scale insulin  every 4 hours. -CBG well-controlled      DVT prophylaxis: Heparin   Medications     amLODipine   5 mg Oral Daily   artificial tears  1 drop Left Eye Q2H while awake   atenolol   50 mg Oral Daily   calcium  carbonate  400 mg of elemental calcium  Oral BID   Chlorhexidine  Gluconate Cloth  6 each Topical Daily   docusate  100 mg Oral BID   feeding supplement  237 mL Oral BID BM   heparin  injection (subcutaneous)  5,000 Units Subcutaneous Q12H   insulin  aspart  0-9 Units Subcutaneous TID AC & HS   metFORMIN   500 mg Oral BID WC   methocarbamol  (ROBAXIN ) injection  1,000 mg Intravenous Q8H   pantoprazole   40 mg Oral QHS   polyethylene glycol  17 g Oral Daily   rosuvastatin   20 mg Oral QHS   senna-docusate  1 tablet Oral QHS     Data Reviewed:   CBG:  Recent Labs  Lab 04/15/24 1111 04/15/24 1732 04/15/24 2110 04/16/24 0839 04/16/24 1232  GLUCAP 133* 106* 91 117* 138*    SpO2: 95 % O2 Flow Rate (L/min): 2 L/min    Vitals:   04/16/24 0739 04/16/24  0902 04/16/24 0938 04/16/24 1131  BP: (!) 149/83 137/79 137/79 123/83  Pulse: 75 84 84   Resp: (!) 22     Temp: 97.9 F (36.6 C)   98.1 F (36.7 C)  TempSrc: Oral   Oral  SpO2: 95%     Weight:      Height:          Data Reviewed:  Basic Metabolic Panel: Recent Labs  Lab 04/12/24 0722 04/13/24 0147 04/13/24 0956 04/13/24 1213 04/14/24 0500 04/15/24 0546 04/16/24 0708  NA 137 140 140 141 141 141 139  K 4.2 3.9 4.0 4.3 3.9 4.1 3.6  CL 103 104  --   --  106 109 107  CO2 22 25  --   --  21* 22 24  GLUCOSE 102* 98  --   --  157* 122* 100*  BUN 19 20  --   --  16 18 17   CREATININE 1.26* 1.24  --   --  1.15 0.99 1.02  CALCIUM  9.4 9.2  --   --  9.1 8.7* 8.8*    CBC: Recent Labs  Lab 04/12/24 0722 04/13/24 0147 04/13/24 0956 04/13/24 1213 04/14/24 0500 04/15/24 0546 04/16/24 0708  WBC 8.2 7.4  --   --  14.3* 15.1* 9.2  NEUTROABS 5.3 4.2  --   --  12.5* 12.1* 5.8  HGB 15.2 15.1 13.9 12.9* 13.6 12.7* 13.3  HCT 45.6 45.4 41.0 38.0* 40.3 38.0* 40.4  MCV 84.0 83.8  --   --  83.6 83.2 84.2  PLT 254 268  --   --  269 256 244    LFT No results for input(s): AST, ALT, ALKPHOS, BILITOT, PROT, ALBUMIN  in the last 168 hours.   Antibiotics: Anti-infectives (From admission, onward)    Start     Dose/Rate Route Frequency Ordered Stop   04/13/24 2200  ceFAZolin  (ANCEF ) IVPB 2g/100 mL premix        2 g 200 mL/hr over 30 Minutes Intravenous Every 8 hours 04/13/24 1549 04/14/24 1400   04/13/24 0941  vancomycin  (VANCOCIN ) powder  Status:  Discontinued          As needed 04/13/24 0942 04/13/24 1405   04/13/24 0645  ceFAZolin  (ANCEF ) IVPB 2g/100 mL premix        2 g 200 mL/hr over 30 Minutes Intravenous  Once 04/13/24 0550 04/13/24 1300        CONSULTS heparin   Code Status: Full code  Family Communication:      Subjective   No new complaints   Objective    Physical Examination:  General-appears in no acute distress Heart-S1-S2, regular, no  murmur auscultated Lungs-clear to auscultation bilaterally, no wheezing or crackles auscultated Abdomen-soft, nontender, no organomegaly Extremities-no edema in the lower extremities Neuro-alert, oriented x3, right-sided weakness             Larry Dawson S Larry Dawson   [facility] If 7PM-7AM, please contact night-coverage at [email or web address], Office  [phone]   04/16/2024, 3:14 PM  LOS: 14 days

## 2024-04-16 NOTE — Progress Notes (Signed)
 I had the chance to come by and talk to the patient, his wife, and other family today.  The patient was sitting in the chair and overall appears to be progressing surprisingly well and very optimistic about his circumstances.  Neurologically speaking the patient can now raise his right arm against gravity without assistance up to the length of the shoulder.  He is also able to move that right leg against gravity without resistance.  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  Vertical extraocular movements remain intact The left eye is fixed in adduction while the right eye demonstrates an inability to adduct past midline.   The patient's speech remains slurred though more clear than it was yesterday.  The patient and family expressed their approval of the patient's current treatment of care and had no questions or concerns at this time.

## 2024-04-16 NOTE — Progress Notes (Signed)
 Orthopedic Tech Progress Note Patient Details:  Larry Dawson 12/19/1955 [id number] [facility] aware of resting hand splint order Patient ID: Larry Dawson, male   DOB: 12/15/1955, 69 y.o.   MRN: [id number]  [name] 04/16/2024, 10:07 AM

## 2024-04-16 NOTE — Progress Notes (Signed)
 Occupational Therapy Treatment Patient Details Name: Larry Dawson MRN: [id number] DOB: 1955-09-20 Today's Date: 04/16/2024   History of present illness 69 yo M adm 04/01/24 with Rt weakness with increased pontine hemorrhage consistent with cavernoma. 04/13/24 craniotomy. PMHx: admission 12/8-12/10 with ICH. HTN, HLD, COPD, IBS, Hernia repair, T2DM   OT comments  Pt in recliner upon entry and eager for participation in OT.  Focused on vision and R UE strength/coordination, midline positioning of trunk/head for balance as precursor to ADLs. Pt with increased L lateral scanning of R eye, minimal scanning to L of L eye.  Increased awareness to deficits, improving ability to find midline positioning of head/neck. Completed mini cog scoring 3/5, recalling 1 word but correctly drawing clock.  Pt with improving awareness of visual deficits, and how partial occlusion glasses are helping him.  Continues to requires total assist for LB ADLs, mod assist for LB ADLs.  MD ordered resting hand splint for R UE, will follow up for schedule tomorrow if delivered.  Continue to recommend >3hrs/day inpatient setting at dc.       If plan is discharge home, recommend the following:  Two people to help with walking and/or transfers;A lot of help with bathing/dressing/bathroom;Assistance with cooking/housework;Assist for transportation;Help with stairs or ramp for entrance;Direct supervision/assist for medications management;Direct supervision/assist for financial management   Equipment Recommendations  Other (comment) (defer)    Recommendations for Other Services Rehab consult;PT consult    Precautions / Restrictions Precautions Precautions: Fall;Other (comment) Recall of Precautions/Restrictions: Impaired Precaution/Restrictions Comments: Rt weakness, Rt hearing loss, Rt gaze Restrictions Weight Bearing Restrictions Per Provider Order: No       Mobility Bed Mobility                    Transfers                          Balance Overall balance assessment: Needs assistance Sitting-balance support: No upper extremity supported, Feet supported Sitting balance-Leahy Scale: Poor Sitting balance - Comments: R lateral and posterior lean without UE or trunk support                                   ADL either performed or assessed with clinical judgement   ADL Overall ADL's : Needs assistance/impaired                 Upper Body Dressing : Moderate assistance;Sitting   Lower Body Dressing: Total assistance;Sitting/lateral leans                 General ADL Comments: session focused on vision and UE strength to R UE    Extremity/Trunk Assessment              Vision   Additional Comments: pt demonstrating improved awareness of visual deficits.  He reports wearing glasses seems to be helping-- completed 2 clocks with improved placement of numbers inside the circle when wearing glasses compared to not wearing partial occlusion glasses (ie he can read the signs, where he can't with glasses off) .  Talked about head position and how he is tending to tilt head to L, and R  turn prefernce.  At midline able to scan R eye to midline and past but this is causing increased diplopia.  Pt with improved eye movement of R eye, past midline but noted nystagmus scanning to L.  L eye he is able to scan up/down and minimally to L (but not to midline).   Perception     Praxis     Communication Communication Communication: No apparent difficulties   Cognition Arousal: Alert Behavior During Therapy: Flat affect Cognition: Cognition impaired     Awareness: Online awareness impaired Memory impairment (select all impairments): Short-term memory, Working memory Attention impairment (select first level of impairment): Sustained attention Executive functioning impairment (select all impairments): Problem solving, Sequencing, Organization OT - Cognition Comments:  mini cog- pt scoring 3/5 (recalling 1/3 words, using L hand to write numbers in clock with fairly good placement and awareness).  Pt motivated, demonstrating increasing awareness of deficits but needing cueing for problem solving.                 Following commands: Impaired Following commands impaired: Follows one step commands with increased time      Cueing   Cueing Techniques: Verbal cues, Tactile cues, Gestural cues  Exercises Exercises: Other exercises Other Exercises Other Exercises: worked on finding midline head/trunk posture, and bringing R eye to midline gaze with R eye to find calender in front of pt Other Exercises: Eye scanning unilaterally, x 5 reps to L and R each eye Other Exercises: UE exercises including: bilateral shoudler elevation, retraction; R UE only: shoudler flexion, elbow flexion/extension, supination/pronation and hand flexon/extension (using AAROM to supination, and tapping for increased hand extension)    Shoulder Instructions       General Comments      Pertinent Vitals/ Pain       Pain Assessment Pain Assessment: Faces Faces Pain Scale: Hurts little more Pain Location: Rt hip Pain Descriptors / Indicators: Guarding, Cramping Pain Intervention(s): Limited activity within patient's tolerance, Monitored during session, Repositioned  Home Living     Available Help at Discharge:  (spouse and 3 dtrs can provide 24/7 assist)                     Bathroom Accessibility: Yes How Accessible: Accessible via walker        Lives With: Spouse    Prior Functioning/Environment              Frequency  Min 2X/week        Progress Toward Goals  OT Goals(current goals can now be found in the care plan section)  Progress towards OT goals: Progressing toward goals     Plan      Co-evaluation                 AM-PAC OT 6 Clicks Daily Activity     Outcome Measure   Help from another person eating meals?: A  Little Help from another person taking care of personal grooming?: A Little Help from another person toileting, which includes using toliet, bedpan, or urinal?: Total Help from another person bathing (including washing, rinsing, drying)?: A Lot Help from another person to put on and taking off regular upper body clothing?: A Lot Help from another person to put on and taking off regular lower body clothing?: Total 6 Click Score: 12    End of Session    OT Visit Diagnosis: Unsteadiness on feet (R26.81);Other abnormalities of gait and mobility (R26.89);Muscle weakness (generalized) (M62.81);Low vision, both eyes (H54.2);Other symptoms and signs involving the nervous system (R29.898);Hemiplegia and hemiparesis Hemiplegia - Right/Left: Right Hemiplegia - dominant/non-dominant: Dominant Hemiplegia - caused by: Nontraumatic intracerebral hemorrhage   Activity Tolerance Patient tolerated treatment well   Patient  Left in chair;with call bell/phone within reach;with chair alarm set   Nurse Communication Mobility status;Precautions        Time: [phone] OT Time Calculation (min): 44 min  Charges: OT General Charges $OT Visit: 1 Visit OT Treatments $Self Care/Home Management : 8-22 mins $Neuromuscular Re-education: 23-37 mins  Etta NOVAK, OT Acute Rehabilitation Services Office [phone] Secure Chat Preferred    Etta GORMAN Hope 04/16/2024, 12:48 PM

## 2024-04-16 NOTE — Progress Notes (Signed)
 Speech Language Pathology Treatment: Cognitive-Linguistic  Patient Details Name: Larry Dawson MRN: [id number] DOB: 02-27-1956 Today's Date: 04/16/2024 Time: 1100-1150 SLP Time Calculation (min) (ACUTE ONLY): 50 min  Assessment / Plan / Recommendation Clinical Impression  Pt seen up in chair with lunch meal. Pt reports he is doin well with oral intake, has been eating solid oods of choice without needing to modify texture. Pt says orally holding a sip before swallowing has been helpful. He no longer feels any need for diet modification or SLP therapy for swallowing. Will advance to regular/thin.  SLP addressed dysarthria and review Slow, Loud Overarticulation strategies to improve articulation accuracy. Pt was able to return demonstrate at word, phrase repetition and conversation level, though eventually, when fully engaged in conversation pt was lost attention to strategies. We talked about fatigue and impact on speech. Pt should f/u with SLP at next level of care for articulation if he desires it, but no acute f/u is needed.  Will sign off.   HPI HPI: Larry Dawson has known history of pontine lesion concerning for cavernoma.  He has now undergone posterior craniotomy with tumor resection on 1/5 and SLP asked to reassess swallow via MBS.  Previously evaluated with MBS prior to surgery to ensure safety with swallowing. Pt demonstrated delayed swallow intiaiton, but no aspiration, recommended to consume regular/thin and has tolerated well.      SLP Plan  All goals met        Swallow Evaluation Recommendations   Recommendations: PO diet PO Diet Recommendation: Regular;Thin liquids (Level 0) Liquid Administration via: Cup;Straw Medication Administration: Whole meds with liquid Supervision: Patient able to self-feed Postural changes: Position pt fully upright for meals Oral care recommendations: Oral care BID (2x/day)     Recommendations                                  All goals met     Larry Dawson, Larry Dawson  04/16/2024, 3:12 PM

## 2024-04-16 NOTE — Plan of Care (Signed)
 " Problem: Education: Goal: Ability to describe self-care measures that may prevent or decrease complications (Diabetes Survival Skills Education) will improve Outcome: Progressing Goal: Individualized Educational Video(s) Outcome: Progressing   Problem: Coping: Goal: Ability to adjust to condition or change in health will improve Outcome: Progressing   Problem: Fluid Volume: Goal: Ability to maintain a balanced intake and output will improve Outcome: Progressing   Problem: Health Behavior/Discharge Planning: Goal: Ability to identify and utilize available resources and services will improve Outcome: Progressing Goal: Ability to manage health-related needs will improve Outcome: Progressing   Problem: Metabolic: Goal: Ability to maintain appropriate glucose levels will improve Outcome: Progressing   Problem: Nutritional: Goal: Maintenance of adequate nutrition will improve Outcome: Progressing Goal: Progress toward achieving an optimal weight will improve Outcome: Progressing   Problem: Skin Integrity: Goal: Risk for impaired skin integrity will decrease Outcome: Progressing   Problem: Tissue Perfusion: Goal: Adequacy of tissue perfusion will improve Outcome: Progressing   Problem: Education: Goal: Knowledge of secondary prevention will improve (MUST DOCUMENT ALL) Outcome: Progressing   Problem: Spontaneous Subarachnoid Hemorrhage Tissue Perfusion: Goal: Complications of Spontaneous Subarachnoid Hemorrhage will be minimized Outcome: Progressing   Problem: Health Behavior/Discharge Planning: Goal: Ability to manage health-related needs will improve Outcome: Progressing Goal: Goals will be collaboratively established with patient/family Outcome: Progressing   Problem: Self-Care: Goal: Ability to participate in self-care as condition permits will improve Outcome: Progressing Goal: Verbalization of feelings and concerns over difficulty with self-care will  improve Outcome: Progressing Goal: Ability to communicate needs accurately will improve Outcome: Progressing   Problem: Education: Goal: Knowledge of General Education information will improve Description: Including pain rating scale, medication(s)/side effects and non-pharmacologic comfort measures Outcome: Progressing   Problem: Health Behavior/Discharge Planning: Goal: Ability to manage health-related needs will improve Outcome: Progressing   Problem: Clinical Measurements: Goal: Ability to maintain clinical measurements within normal limits will improve Outcome: Progressing Goal: Will remain free from infection Outcome: Progressing Goal: Diagnostic test results will improve Outcome: Progressing Goal: Respiratory complications will improve Outcome: Progressing Goal: Cardiovascular complication will be avoided Outcome: Progressing   Problem: Activity: Goal: Risk for activity intolerance will decrease Outcome: Progressing   Problem: Nutrition: Goal: Adequate nutrition will be maintained Outcome: Progressing   Problem: Coping: Goal: Level of anxiety will decrease Outcome: Progressing   Problem: Elimination: Goal: Will not experience complications related to bowel motility Outcome: Progressing Goal: Will not experience complications related to urinary retention Outcome: Progressing   Problem: Pain Managment: Goal: General experience of comfort will improve and/or be controlled Outcome: Progressing   Problem: Safety: Goal: Ability to remain free from injury will improve Outcome: Progressing   Problem: Skin Integrity: Goal: Risk for impaired skin integrity will decrease Outcome: Progressing   Problem: Education: Goal: Knowledge of the prescribed therapeutic regimen will improve Outcome: Progressing   Problem: Bowel/Gastric: Goal: Gastrointestinal status for postoperative course will improve Outcome: Progressing   Problem: Cardiac: Goal: Ability to  maintain an adequate cardiac output Outcome: Progressing Goal: Will show no evidence of cardiac arrhythmias Outcome: Progressing   Problem: Nutritional: Goal: Will attain and maintain optimal nutritional status Outcome: Progressing   Problem: Neurological: Goal: Will regain or maintain usual level of consciousness Outcome: Progressing   Problem: Clinical Measurements: Goal: Ability to maintain clinical measurements within normal limits Outcome: Progressing Goal: Postoperative complications will be avoided or minimized Outcome: Progressing   Problem: Respiratory: Goal: Will regain and/or maintain adequate ventilation Outcome: Progressing Goal: Respiratory status will improve Outcome: Progressing   Problem: Skin Integrity:  Goal: Demonstrates signs of wound healing without infection Outcome: Progressing   Problem: Urinary Elimination: Goal: Will remain free from infection Outcome: Progressing Goal: Ability to achieve and maintain adequate urine output Outcome: Progressing   "

## 2024-04-16 NOTE — Progress Notes (Signed)
" ° °  Inpatient Rehabilitation Admissions Coordinator   I spoke with patietn;s wife by phone. SHe states she and family can provide care for him after a CIR admit. SHe provided me with his [facility] # [id number]. I will begin Auth for possible CIR admit.  Heron Leavell, RN, MSN Rehab Admissions Coordinator [phone] 04/16/2024 11:35 AM  "

## 2024-04-17 DIAGNOSIS — D1802 Hemangioma of intracranial structures: Secondary | ICD-10-CM

## 2024-04-17 LAB — CBC WITH DIFFERENTIAL/PLATELET
Abs Immature Granulocytes: 0.03 K/uL (ref 0.00–0.07)
Basophils Absolute: 0.1 K/uL (ref 0.0–0.1)
Basophils Relative: 1 %
Eosinophils Absolute: 0.1 K/uL (ref 0.0–0.5)
Eosinophils Relative: 2 %
HCT: 40.4 % (ref 39.0–52.0)
Hemoglobin: 13.3 g/dL (ref 13.0–17.0)
Immature Granulocytes: 1 %
Lymphocytes Relative: 29 %
Lymphs Abs: 1.8 K/uL (ref 0.7–4.0)
MCH: 27.9 pg (ref 26.0–34.0)
MCHC: 32.9 g/dL (ref 30.0–36.0)
MCV: 84.7 fL (ref 80.0–100.0)
Monocytes Absolute: 0.5 K/uL (ref 0.1–1.0)
Monocytes Relative: 9 %
Neutro Abs: 3.6 K/uL (ref 1.7–7.7)
Neutrophils Relative %: 58 %
Platelets: 217 K/uL (ref 150–400)
RBC: 4.77 MIL/uL (ref 4.22–5.81)
RDW: 12.5 % (ref 11.5–15.5)
WBC: 6.1 K/uL (ref 4.0–10.5)
nRBC: 0 % (ref 0.0–0.2)

## 2024-04-17 LAB — BASIC METABOLIC PANEL WITH GFR
Anion gap: 11 (ref 5–15)
BUN: 15 mg/dL (ref 8–23)
CO2: 18 mmol/L — ABNORMAL LOW (ref 22–32)
Calcium: 8.8 mg/dL — ABNORMAL LOW (ref 8.9–10.3)
Chloride: 107 mmol/L (ref 98–111)
Creatinine, Ser: 0.93 mg/dL (ref 0.61–1.24)
GFR, Estimated: >60 mL/min
Glucose, Bld: 112 mg/dL — ABNORMAL HIGH (ref 70–99)
Potassium: 4 mmol/L (ref 3.5–5.1)
Sodium: 136 mmol/L (ref 135–145)

## 2024-04-17 LAB — GLUCOSE, CAPILLARY
Glucose-Capillary: 128 mg/dL — ABNORMAL HIGH (ref 70–99)
Glucose-Capillary: 125 mg/dL — ABNORMAL HIGH (ref 70–99)
Glucose-Capillary: 100 mg/dL — ABNORMAL HIGH (ref 70–99)
Glucose-Capillary: 104 mg/dL — ABNORMAL HIGH (ref 70–99)

## 2024-04-17 MED ORDER — INSULIN GLARGINE 100 UNIT/ML ~~LOC~~ SOLN
10.0000 [IU] | Freq: Every day | SUBCUTANEOUS | Status: DC
  Administered 2024-04-17: 10 [IU] via SUBCUTANEOUS
  Filled 2024-04-17 (×2): qty 0.1

## 2024-04-17 NOTE — Progress Notes (Addendum)
 [facility]  PROGRESS NOTE  Larry Dawson FMW:[id number] DOB: 09-Nov-1955 DOA: 04/01/2024 PCP: [name], MD   Brief HPI:   69 year old man w/ hx of COPD, HTN, HLD presenting with imbalance and ill-defined feelings of numbness throughout head. 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 He arrives to ICU for further management.     Assessment/Plan:   Symptomatic brainstem covernoma  -S/P posterior fossa cranitomy and resection on 04/14/23 by Dr. [name]   GERD - Continue pantoprazole   Hypertension Keep SBP below 150 .  IV hydralazine  PRN ordered. Continue atenolol  - Added amlodipine  5 mg daily    COPD Stable, no signs of acute exacerbation.  Does not appear to be on inhalers at home.   Type 2 diabetes Hemoglobin A1c 7.2 on 01/06/2024.   Placed on sensitive sliding scale insulin  every 4 hours. -CBG well-controlled - Will add Lantus  10 units subcu daily       DVT prophylaxis: Heparin   Medications     amLODipine   5 mg Oral Daily   artificial tears  1 drop Left Eye Q2H while awake   atenolol   50 mg Oral Daily   calcium  carbonate  400 mg of elemental calcium  Oral BID   Chlorhexidine  Gluconate Cloth  6 each Topical Daily   docusate  100 mg Oral BID   feeding supplement  237 mL Oral BID BM   heparin  injection (subcutaneous)  5,000 Units Subcutaneous Q12H   insulin  aspart  0-9 Units Subcutaneous TID AC & HS   metFORMIN   500 mg Oral BID WC   pantoprazole   40 mg Oral QHS   polyethylene glycol  17 g Oral Daily   rosuvastatin   20 mg Oral QHS   senna-docusate  1 tablet Oral QHS     Data Reviewed:   CBG:  Recent Labs  Lab 04/16/24 0839 04/16/24 1232 04/16/24 1541 04/16/24 2134 04/17/24 0815  GLUCAP 117* 138* 100* 104* 128*    SpO2: 96 % O2 Flow Rate (L/min): 2 L/min    Vitals:   04/16/24 1954 04/16/24 2344 04/17/24 0323 04/17/24 0815  BP: (!) 155/85 130/74 135/76  137/74  Pulse: 72 70 65 72  Resp: 20 20 17 14   Temp: 98.5 F (36.9 C) 98.3 F (36.8 C) 98.2 F (36.8 C) 98.6 F (37 C)  TempSrc: Oral Oral Oral Oral  SpO2: 96% 96% 96% 96%  Weight:      Height:          Data Reviewed:  Basic Metabolic Panel: Recent Labs  Lab 04/13/24 0147 04/13/24 0956 04/13/24 1213 04/14/24 0500 04/15/24 0546 04/16/24 0708 04/17/24 0629  NA 140   < > 141 141 141 139 136  K 3.9   < > 4.3 3.9 4.1 3.6 4.0  CL 104  --   --  106 109 107 107  CO2 25  --   --  21* 22 24 18*  GLUCOSE 98  --   --  157* 122* 100* 112*  BUN 20  --   --  16 18 17 15   CREATININE 1.24  --   --  1.15 0.99 1.02 0.93  CALCIUM  9.2  --   --  9.1 8.7* 8.8* 8.8*   < > = values in this interval not displayed.    CBC: Recent Labs  Lab 04/13/24 0147 04/13/24 0956 04/13/24 1213 04/14/24 0500 04/15/24 0546 04/16/24 0708 04/17/24 0629  WBC 7.4  --   --  14.3* 15.1* 9.2 6.1  NEUTROABS 4.2  --   --  12.5* 12.1* 5.8 3.6  HGB 15.1   < > 12.9* 13.6 12.7* 13.3 13.3  HCT 45.4   < > 38.0* 40.3 38.0* 40.4 40.4  MCV 83.8  --   --  83.6 83.2 84.2 84.7  PLT 268  --   --  269 256 244 217   < > = values in this interval not displayed.    LFT No results for input(s): AST, ALT, ALKPHOS, BILITOT, PROT, ALBUMIN  in the last 168 hours.   Antibiotics: Anti-infectives (From admission, onward)    Start     Dose/Rate Route Frequency Ordered Stop   04/13/24 2200  ceFAZolin  (ANCEF ) IVPB 2g/100 mL premix        2 g 200 mL/hr over 30 Minutes Intravenous Every 8 hours 04/13/24 1549 04/14/24 1400   04/13/24 0941  vancomycin  (VANCOCIN ) powder  Status:  Discontinued          As needed 04/13/24 0942 04/13/24 1405   04/13/24 0645  ceFAZolin  (ANCEF ) IVPB 2g/100 mL premix        2 g 200 mL/hr over 30 Minutes Intravenous  Once 04/13/24 0550 04/13/24 1300        CONSULTS heparin   Code Status: Full code  Family Communication:      Subjective   Denies any complaints  Objective     Physical Examination:  General-appears in no acute distress Heart-S1-S2, regular, no murmur auscultated Lungs-clear to auscultation bilaterally, no wheezing or crackles auscultated Abdomen-soft, nontender, no organomegaly Extremities-no edema in the lower extremities Neuro-alert, oriented x3, motor strength 4/5 in right upper extremity             Panayiota Larkin S Zavier Canela   [facility] If 7PM-7AM, please contact night-coverage at [email or web address], Office  [phone]   04/17/2024, 8:51 AM  LOS: 15 days

## 2024-04-17 NOTE — Progress Notes (Signed)
 Occupational Therapy Treatment Patient Details Name: Larry Dawson MRN: [id number] DOB: 08-22-1955 Today's Date: 04/17/2024   History of present illness 69 yo M adm 04/01/24 with Rt weakness with increased pontine hemorrhage consistent with cavernoma. 04/13/24 craniotomy. PMHx: admission 12/8-12/10 with ICH. HTN, HLD, COPD, IBS, Hernia repair, T2DM   OT comments  Pt supine in bed and agreeable/eager to mobilize OOB.  Pt transitioning to EOB with contact guard assist, cueing for pacing/safety and use of rail to support trunk.  Improving sitting balance at EOB with contact guard statically but continues to R posterior lean dynamically.  Min guard +2 to stand in stedy for safety, impulsively standing before therapist was ready. Sat at sink in stedy with contact guard to complete oral car and washing face.  Continues to have difficulty using R UE functionally during ADLs.  PROM to R UE with gentle stretch provided once in chair, working on R elbow extension with pt completing SROM.  Placed resting hand splint on, fits well- will return to check fit after 2 hours. Will follow acutely, continue to progress towards goals and continue to recommend >3hrs/day inpatient setting at dc.       If plan is discharge home, recommend the following:  A lot of help with bathing/dressing/bathroom;Assistance with cooking/housework;Assist for transportation;Help with stairs or ramp for entrance;Direct supervision/assist for medications management;Direct supervision/assist for financial management;A lot of help with walking and/or transfers   Equipment Recommendations  Other (comment) (defer)    Recommendations for Other Services Rehab consult    Precautions / Restrictions Precautions Precautions: Fall;Other (comment) Recall of Precautions/Restrictions: Impaired Precaution/Restrictions Comments: Rt weakness, Rt hearing loss, Rt gaze Required Braces or Orthoses: Other Brace Other Brace: resting hand splint for night  time use R UE Restrictions Weight Bearing Restrictions Per Provider Order: No       Mobility Bed Mobility Overal bed mobility: Needs Assistance Bed Mobility: Supine to Sit     Supine to sit: Contact guard     General bed mobility comments: contact guard for safety, increased time and min verbal cues for R side mgmt, trunk elevation using grabbar and scooting forward    Transfers Overall transfer level: Needs assistance Equipment used: Ambulation equipment used Transfers: Sit to/from Stand, Bed to chair/wheelchair/BSC Sit to Stand: Contact guard assist, +2 safety/equipment           General transfer comment: contact guard to stand to stedy, impulsive and standing before therapist was ready.  Initally leaning towards R side but correcting with cueing. Transfer via Lift Equipment: Stedy   Balance Overall balance assessment: Needs assistance Sitting-balance support: No upper extremity supported, Feet supported Sitting balance-Leahy Scale: Fair Sitting balance - Comments: improved static sitting balance, tends to leave posterior to R with dynamic tasks Postural control: Posterior lean, Right lateral lean Standing balance support: Bilateral upper extremity supported, During functional activity Standing balance-Leahy Scale: Poor Standing balance comment: relies on external and UE support                           ADL either performed or assessed with clinical judgement   ADL Overall ADL's : Needs assistance/impaired     Grooming: Contact guard assist;Sitting Grooming Details (indicate cue type and reason): perched on stedy at sink         Upper Body Dressing : Sitting;Moderate assistance   Lower Body Dressing: +2 for physical assistance;Total assistance;Sit to/from stand Lower Body Dressing Details (indicate cue type and reason):  assist for clothing mgmt, but standing with contact guard today in stedy   Toilet Transfer Details (indicate cue type and  reason): using stedy         Functional mobility during ADLs: Contact guard assist (stedy)      Extremity/Trunk Assessment              Vision       Perception     Praxis     Communication Communication Communication: No apparent difficulties   Cognition Arousal: Alert Behavior During Therapy: WFL for tasks assessed/performed, Impulsive Cognition: Cognition impaired     Awareness: Online awareness impaired Memory impairment (select all impairments): Short-term memory Attention impairment (select first level of impairment): Sustained attention Executive functioning impairment (select all impairments): Problem solving, Sequencing, Organization, Reasoning OT - Cognition Comments: pt with increased impulsivity today, requires cueing to pace for safety with transfers. highly motivated.  continues with some decreased recall.                 Following commands: Impaired Following commands impaired: Follows one step commands with increased time      Cueing   Cueing Techniques: Verbal cues, Tactile cues, Gestural cues  Exercises Exercises: Other exercises Other Exercises Other Exercises: PROM of R UE; SROM of R UE into eblow extension and supination    Shoulder Instructions       General Comments VSS, therapist donned R resting hand splint to assess fit and tolerance for overnight use.    Pertinent Vitals/ Pain       Pain Assessment Pain Assessment: Faces Faces Pain Scale: Hurts a little bit Pain Location: R hip Pain Descriptors / Indicators: Guarding Pain Intervention(s): Limited activity within patient's tolerance, Monitored during session, Repositioned  Home Living                                          Prior Functioning/Environment              Frequency  Min 2X/week        Progress Toward Goals  OT Goals(current goals can now be found in the care plan section)  Progress towards OT goals: Progressing toward  goals     Plan      Co-evaluation                 AM-PAC OT 6 Clicks Daily Activity     Outcome Measure   Help from another person eating meals?: A Little Help from another person taking care of personal grooming?: A Little Help from another person toileting, which includes using toliet, bedpan, or urinal?: Total Help from another person bathing (including washing, rinsing, drying)?: A Lot Help from another person to put on and taking off regular upper body clothing?: A Lot Help from another person to put on and taking off regular lower body clothing?: A Lot 6 Click Score: 13    End of Session Equipment Utilized During Treatment: Gait belt;Other (comment) (stedy)  OT Visit Diagnosis: Unsteadiness on feet (R26.81);Other abnormalities of gait and mobility (R26.89);Muscle weakness (generalized) (M62.81);Low vision, both eyes (H54.2);Other symptoms and signs involving the nervous system (R29.898);Hemiplegia and hemiparesis Hemiplegia - Right/Left: Right Hemiplegia - dominant/non-dominant: Dominant Hemiplegia - caused by: Nontraumatic intracerebral hemorrhage   Activity Tolerance Patient tolerated treatment well   Patient Left in chair;with call bell/phone within reach;with chair alarm set;with family/visitor present   Nurse  Communication Mobility status;Precautions        Time: 9147-9069 OT Time Calculation (min): 38 min  Charges: OT General Charges $OT Visit: 1 Visit OT Treatments $Self Care/Home Management : 8-22 mins $Therapeutic Activity: 8-22 mins $Neuromuscular Re-education: 8-22 mins  Etta NOVAK, OT Acute Rehabilitation Services Office [phone] Secure Chat Preferred    Etta GORMAN Hope 04/17/2024, 12:56 PM

## 2024-04-17 NOTE — H&P (Shared)
 "   Physical Medicine and Rehabilitation Admission H&P    Chief Complaint  Patient presents with   Brain Stem Bleed  : HPI: Larry Dawson. Rosten is a 69 year old right-handed male with history significant for COPD/tobacco use, hypertension, hyperlipidemia, diabetes mellitus, IBS, class II obesity with BMI 35.29 as well as recent admission 12/8 - 12/10 for feeling lightheaded/off-balance was 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  Per chart review patient lives with spouse.  Two-level home bed and bath upstairs 4 steps to enter.  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  Presented 04/01/2024 with increasing right sided weakness/numbness/ataxia and intermittent diplopia with slurring of speech and difficulty swallowing.  Patient also with issues of mild headache.  Denied any nausea or vomiting.  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  Admission chemistries unremarkable except potassium 3.1, glucose 116, urine drug screen negative.  Recent echocardiogram with ejection fraction of 50 to 55% no wall motion abnormalities.  Patient underwent posterior craniotomy with tumor resection 04/13/2024 per Dr. [name].  He was cleared to begin subcutaneous heparin  for DVT prophylaxis 04/14/2024.  Therapy evaluations completed due to patient's decreased functional mobility and right-sided weakness was admitted for a comprehensive rehab program.  Review of Systems  Constitutional:  Negative for chills and fever.  HENT:  Negative for hearing loss.   Eyes:  Positive for double vision.  Respiratory:  Negative for cough, shortness of breath and wheezing.   Cardiovascular:  Negative for chest pain, palpitations and leg swelling.  Gastrointestinal:  Positive for constipation.  Negative for heartburn, nausea and vomiting.  Genitourinary:  Positive for urgency. Negative for dysuria, flank pain and hematuria.  Musculoskeletal:  Positive for myalgias.  Skin:  Negative for rash.  Neurological:  Positive for dizziness, speech change, weakness and headaches.       Ataxic gait  All other systems reviewed and are negative.  Past Medical History:  Diagnosis Date   Allergy    Anal fissure    COPD (chronic obstructive pulmonary disease) (HCC)    Albuterol  PRN.   Diabetes mellitus without complication (HCC)    Hydrocele    Hyperlipidemia    Hypertension    IBS (irritable bowel syndrome) 05/22/2013   per chart note-pt denied   Past Surgical History:  Procedure Laterality Date   APPLICATION OF CRANIAL NAVIGATION  04/13/2024   Procedure: COMPUTER-ASSISTED NAVIGATION, FOR CRANIAL PROCEDURE;  Surgeon: [name] Dino HERO, MD;  Location: [facility] OR;  Service: Neurosurgery;;   CRANIOTOMY Left 04/13/2024   Procedure: Left Posterior Fossa Craniotomy for ARTERIO-VENOUS MALFORMATION DURAL COMPLEX;  Surgeon: [name] Dino HERO, MD;  Location: [facility] OR;  Service: Neurosurgery;  Laterality: Left;   HERNIA REPAIR  prior to 2013   HYDROCELE EXCISION Bilateral 11/23/2013   Procedure: HYDROCELECTOMY ADULT;  Surgeon: Alm GORMAN Fragmin, MD;  Location: [facility];  Service: Urology;  Laterality: Bilateral;   VASECTOMY  prior to 2013   Family History  Problem Relation Age of Onset   Diabetes Father    Hypertension Father    Diabetes Brother    Hypertension Brother    Diabetes Brother    Diabetes Brother    Social History:  reports that he has quit smoking. His smoking use included cigarettes. He has never used smokeless tobacco. He reports that he does not currently  use alcohol . He reports that he does not use drugs. Allergies: Allergies[1] Medications Prior to Admission  Medication Sig Dispense Refill   Arginine 500 MG CAPS Take 1 capsule by mouth daily.     Ascorbic Acid (VITAMIN  C PO) Take 1 tablet by mouth daily.     atenolol -chlorthalidone  (TENORETIC ) 50-25 MG tablet Take 1 tablet by mouth daily.     cetirizine  (ZYRTEC ) 10 MG tablet Take 1 tablet (10 mg total) by mouth daily. (Patient taking differently: Take 10 mg by mouth daily as needed for allergies.) 30 tablet 0   cholecalciferol (VITAMIN D) 1000 units tablet Take 1,000 Units by mouth daily.     glipiZIDE  (GLUCOTROL ) 5 MG tablet TAKE 1 TABLET BY MOUTH 2 TIMES A DAY BEFORE A MEAL 60 tablet 3   metFORMIN  (GLUCOPHAGE ) 500 MG tablet TAKE 1 TABLET BY MOUTH TWICE A DAY WITH A MEAL 180 tablet 3   Multiple Vitamins-Minerals (ZINC  PO) Take 1 tablet by mouth daily.     Omega-3 Fatty Acids (FISH OIL) 1000 MG CAPS Take 1 capsule by mouth daily.     omeprazole  (PRILOSEC) 20 MG capsule Take 20 mg by mouth daily.     sildenafil  (VIAGRA ) 100 MG tablet Take 0.5-1 tablets (50-100 mg total) by mouth daily as needed for erectile dysfunction. 5 tablet 11   tirzepatide  (MOUNJARO ) 5 MG/0.5ML Pen Inject 5 mg into the skin once a week. 6 mL 3   rosuvastatin  (CRESTOR ) 20 MG tablet Take 20 mg by mouth at bedtime. (Patient not taking: Reported on 04/01/2024)        Home: Home Living Family/patient expects to be discharged to:: Private residence Living Arrangements: Spouse/significant other Available Help at Discharge:  (spouse and 3 dtrs can provide 24/7 assist) Type of Home: House Home Access: Stairs to enter [employer] of Steps: 4 Entrance Stairs-Rails: Right Home Layout: Two level, Bed/bath upstairs Alternate Level Stairs-Number of Steps: flight Alternate Level Stairs-Rails: Right Bathroom Shower/Tub: Health Visitor: Handicapped height Bathroom Accessibility: Yes Home Equipment: None Additional Comments: drives a tour bus  Lives With: Spouse   Functional History: Prior Function Prior Level of Function : Independent/Modified Independent, Working/employed, Driving Mobility Comments: indep, no  AD, worked driving tour buses ADLs Comments: indep, working drives a tour bus  Functional Status:  Mobility: Bed Mobility Overal bed mobility: Needs Assistance Bed Mobility: Supine to Sit Rolling: Mod assist Sidelying to sit: Mod assist Supine to sit: Contact guard General bed mobility comments: OOB in recliner Transfers Overall transfer level: Needs assistance Equipment used: Ambulation equipment used Transfers: Sit to/from Stand, Bed to chair/wheelchair/BSC Sit to Stand: Contact guard assist, +2 safety/equipment Bed to/from chair/wheelchair/BSC transfer type:: Via Lift equipment Stand pivot transfers: Max assist, +2 physical assistance Transfer via Lift Equipment: Stedy General transfer comment: contact guard to stand to stedy, impulsive and standing before therapist was ready.  Initally leaning towards R side but correcting with cueing. Ambulation/Gait Ambulation/Gait assistance: Max assist, +2 physical assistance Gait Distance (Feet): 3 Feet Assistive device:  (hall railing) Gait Pattern/deviations: Step-to pattern, Decreased stance time - right, Decreased step length - left, Decreased dorsiflexion - right, Ataxic, Decreased step length - right, Narrow base of support, Trunk flexed General Gait Details: max assist to control balance, RLE blocking and assisting with placing RLE as pt either dragging or exaggerated stepping, +2 for safety with very close chair follow, use of Rail in hall on LUE Gait velocity: dec Gait velocity interpretation: <1.31 ft/sec, indicative of household ambulator  ADL: ADL Overall ADL's : Needs assistance/impaired Eating/Feeding: Minimal assistance, Sitting Grooming: Contact guard assist, Sitting Grooming Details (indicate cue type and reason): perched on stedy at sink Upper Body Bathing: Moderate assistance Lower Body Bathing: Moderate assistance Upper Body Dressing : Sitting, Moderate assistance Upper Body Dressing Details (indicate cue type  and reason): and extra time Lower Body Dressing: +2 for physical assistance, Total assistance, Sit to/from stand Lower Body Dressing Details (indicate cue type and reason): assist for clothing mgmt, but standing with contact guard today in stedy Toilet Transfer: Maximal assistance, +2 for safety/equipment, Stand-pivot, Rolling walker (2 wheels) Toilet Transfer Details (indicate cue type and reason): using stedy Functional mobility during ADLs: Contact guard assist (stedy) General ADL Comments: returned to check splint to R UE  Cognition: Cognition Orientation Level: Oriented X4 Cognition Arousal: Alert Behavior During Therapy: WFL for tasks assessed/performed, Impulsive  Physical Exam: Blood pressure (!) 140/69, pulse 68, temperature 98.3 F (36.8 C), temperature source Oral, resp. rate 17, height 5' 7 (1.702 m), weight 102.2 kg, SpO2 98%. Physical Exam HENT:     Head:     Comments: Surgical site clean and dry with dressing Neurological:     Comments: Patient was alert.  He did display some mild dysarthria.  Follows simple commands.  Provides name and age.     Results for orders placed or performed during the hospital encounter of 04/01/24 (from the past 48 hours)  Glucose, capillary     Status: Abnormal   Collection Time: 04/15/24  5:32 PM  Result Value Ref Range   Glucose-Capillary 106 (H) 70 - 99 mg/dL    Comment: Glucose reference range applies only to samples taken after fasting for at least 8 hours.  Glucose, capillary     Status: None   Collection Time: 04/15/24  9:10 PM  Result Value Ref Range   Glucose-Capillary 91 70 - 99 mg/dL    Comment: Glucose reference range applies only to samples taken after fasting for at least 8 hours.  Basic metabolic panel with GFR     Status: Abnormal   Collection Time: 04/16/24  7:08 AM  Result Value Ref Range   Sodium 139 135 - 145 mmol/L   Potassium 3.6 3.5 - 5.1 mmol/L   Chloride 107 98 - 111 mmol/L   CO2 24 22 - 32 mmol/L    Glucose, Bld 100 (H) 70 - 99 mg/dL    Comment: Glucose reference range applies only to samples taken after fasting for at least 8 hours.   BUN 17 8 - 23 mg/dL   Creatinine, Ser 8.97 0.61 - 1.24 mg/dL   Calcium  8.8 (L) 8.9 - 10.3 mg/dL   GFR, Estimated >39 >39 mL/min    Comment: (NOTE) Calculated using the CKD-EPI Creatinine Equation (2021)    Anion gap 9 5 - 15    Comment: Performed at [facility], [address], [place], [place] [address]  CBC with Differential/Platelet     Status: None   Collection Time: 04/16/24  7:08 AM  Result Value Ref Range   WBC 9.2 4.0 - 10.5 K/uL   RBC 4.80 4.22 - 5.81 MIL/uL   Hemoglobin 13.3 13.0 - 17.0 g/dL   HCT 59.5 60.9 - 47.9 %   MCV 84.2 80.0 - 100.0 fL   MCH 27.7 26.0 - 34.0 pg   MCHC 32.9 30.0 - 36.0 g/dL   RDW 87.3 88.4 - 84.4 %   Platelets 244 150 - 400 K/uL   nRBC  0.0 0.0 - 0.2 %   Neutrophils Relative % 62 %   Neutro Abs 5.8 1.7 - 7.7 K/uL   Lymphocytes Relative 26 %   Lymphs Abs 2.3 0.7 - 4.0 K/uL   Monocytes Relative 9 %   Monocytes Absolute 0.9 0.1 - 1.0 K/uL   Eosinophils Relative 1 %   Eosinophils Absolute 0.1 0.0 - 0.5 K/uL   Basophils Relative 1 %   Basophils Absolute 0.1 0.0 - 0.1 K/uL   Immature Granulocytes 1 %   Abs Immature Granulocytes 0.05 0.00 - 0.07 K/uL    Comment: Performed at [facility], [address], [place], [place] [address]  Glucose, capillary     Status: Abnormal   Collection Time: 04/16/24  8:39 AM  Result Value Ref Range   Glucose-Capillary 117 (H) 70 - 99 mg/dL    Comment: Glucose reference range applies only to samples taken after fasting for at least 8 hours.  Glucose, capillary     Status: Abnormal   Collection Time: 04/16/24 12:32 PM  Result Value Ref Range   Glucose-Capillary 138 (H) 70 - 99 mg/dL    Comment: Glucose reference range applies only to samples taken after fasting for at least 8 hours.  Glucose, capillary     Status: Abnormal   Collection Time: 04/16/24  3:41 PM   Result Value Ref Range   Glucose-Capillary 100 (H) 70 - 99 mg/dL    Comment: Glucose reference range applies only to samples taken after fasting for at least 8 hours.  Glucose, capillary     Status: Abnormal   Collection Time: 04/16/24  9:34 PM  Result Value Ref Range   Glucose-Capillary 104 (H) 70 - 99 mg/dL    Comment: Glucose reference range applies only to samples taken after fasting for at least 8 hours.  Basic metabolic panel with GFR     Status: Abnormal   Collection Time: 04/17/24  6:29 AM  Result Value Ref Range   Sodium 136 135 - 145 mmol/L   Potassium 4.0 3.5 - 5.1 mmol/L    Comment: HEMOLYSIS AT THIS LEVEL MAY AFFECT RESULT   Chloride 107 98 - 111 mmol/L   CO2 18 (L) 22 - 32 mmol/L   Glucose, Bld 112 (H) 70 - 99 mg/dL    Comment: Glucose reference range applies only to samples taken after fasting for at least 8 hours.   BUN 15 8 - 23 mg/dL   Creatinine, Ser 9.06 0.61 - 1.24 mg/dL   Calcium  8.8 (L) 8.9 - 10.3 mg/dL   GFR, Estimated >39 >39 mL/min    Comment: (NOTE) Calculated using the CKD-EPI Creatinine Equation (2021)    Anion gap 11 5 - 15    Comment: Performed at [facility], [address], [place], [place] [address]  CBC with Differential/Platelet     Status: None   Collection Time: 04/17/24  6:29 AM  Result Value Ref Range   WBC 6.1 4.0 - 10.5 K/uL   RBC 4.77 4.22 - 5.81 MIL/uL   Hemoglobin 13.3 13.0 - 17.0 g/dL   HCT 59.5 60.9 - 47.9 %   MCV 84.7 80.0 - 100.0 fL   MCH 27.9 26.0 - 34.0 pg   MCHC 32.9 30.0 - 36.0 g/dL   RDW 87.4 88.4 - 84.4 %   Platelets 217 150 - 400 K/uL   nRBC 0.0 0.0 - 0.2 %   Neutrophils Relative % 58 %   Neutro Abs 3.6 1.7 -  7.7 K/uL   Lymphocytes Relative 29 %   Lymphs Abs 1.8 0.7 - 4.0 K/uL   Monocytes Relative 9 %   Monocytes Absolute 0.5 0.1 - 1.0 K/uL   Eosinophils Relative 2 %   Eosinophils Absolute 0.1 0.0 - 0.5 K/uL   Basophils Relative 1 %   Basophils Absolute 0.1 0.0 - 0.1 K/uL   Immature Granulocytes 1 %    Abs Immature Granulocytes 0.03 0.00 - 0.07 K/uL    Comment: Performed at [facility], [address] [place], [place] [address]  Glucose, capillary     Status: Abnormal   Collection Time: 04/17/24  8:15 AM  Result Value Ref Range   Glucose-Capillary 128 (H) 70 - 99 mg/dL    Comment: Glucose reference range applies only to samples taken after fasting for at least 8 hours.  Glucose, capillary     Status: Abnormal   Collection Time: 04/17/24 11:06 AM  Result Value Ref Range   Glucose-Capillary 125 (H) 70 - 99 mg/dL    Comment: Glucose reference range applies only to samples taken after fasting for at least 8 hours.   No results found.    Blood pressure (!) 140/69, pulse 68, temperature 98.3 F (36.8 C), temperature source Oral, resp. rate 17, height 5' 7 (1.702 m), weight 102.2 kg, SpO2 98%.  Medical Problem List and Plan: 1. 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 Janjua  -patient may *** shower  -ELOS/Goals: *** 2.  Antithrombotics: -DVT/anticoagulation:  Pharmaceutical: Heparin  initiated 04/14/2024  -antiplatelet therapy: N/A 3. Pain Management: Tylenol  as needed 4. Mood/Behavior/Sleep: Provide emotional support  -antipsychotic agents: N/A 5. Neuropsych/cognition: This patient is capable of making decisions on his own behalf. 6. Skin/Wound Care: Routine skin checks 7. Fluids/Electrolytes/Nutrition: Routine and analysis with follow-up chemistries 8.  Diabetes mellitus.  Hemoglobin A1c 7.2.  Glucophage  500 mg twice daily 9.  Hyperlipidemia.  Crestor  10.  Hypertension.  Norvasc  5 mg daily, Tenormin  50 mg daily, monitor with increased mobility 11.  Class II obesity.  BMI 35.29.  Dietary follow-up 12.  Constipation/IBS.  MiraLAX  daily, Senokot 1 tablet nightly 13.  COPD.  History of tobacco use.  Monitor oxygen saturations.  Discussed no nicotine.  Toribio PARAS Raijon Lindfors, PA-C 04/17/2024     [1]   Allergies Allergen Reactions   Ace Inhibitors Swelling    Suspected angioedema   Penicillins Anaphylaxis, Hives, Rash and Other (See Comments)    Throat swells   Latex Hives, Swelling and Rash   "

## 2024-04-17 NOTE — Progress Notes (Signed)
" ° °  Inpatient Rehabilitation Admissions Coordinator   I await insurance approval to admit to CIR. I met with patient and wife a bedside and they are aware.  Heron Leavell, RN, MSN Rehab Admissions Coordinator [phone] 04/17/2024 1:04 PM  "

## 2024-04-17 NOTE — Progress Notes (Signed)
 I had a chance to come by and talk with the patient today.  Today he was alone, and just waking up lying in the bed.  The patient's clinical status appears mostly unchanged.  There may be some mild strength improvement in that right upper extremity however it is minuscule compared to my previous assessment.  The patient is still able to raise his right arm against gravity without assistance, move that right leg against gravity without assistance.  He is currently working with rehab and reports that he is proud of himself and optimistic about his recovery.  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  Vertical extraocular movements remain intact.  The left eye is fixed in adduction while the right eye demonstrates an inability to adduct past midline.  No changes here.  The patient speech remains slurred.  The patient expresses his satisfaction with the care he has received thus far. We are happy to take care of him.   The goal today is to have the patient working with rehab and sitting in the chair consistently all day to allow for blood and CSF drainage and minimize rises in ICP.  The patient is also receiving subcu heparin  5000 twice daily for DVT prophylaxis.  Please report any changes in neurostatus.

## 2024-04-17 NOTE — Plan of Care (Signed)
" °  Problem: Education: Goal: Ability to describe self-care measures that may prevent or decrease complications (Diabetes Survival Skills Education) will improve Outcome: Progressing Goal: Individualized Educational Video(s) Outcome: Progressing   Problem: Coping: Goal: Ability to adjust to condition or change in health will improve Outcome: Progressing   Problem: Fluid Volume: Goal: Ability to maintain a balanced intake and output will improve Outcome: Progressing   Problem: Health Behavior/Discharge Planning: Goal: Ability to identify and utilize available resources and services will improve Outcome: Progressing Goal: Ability to manage health-related needs will improve Outcome: Progressing   Problem: Metabolic: Goal: Ability to maintain appropriate glucose levels will improve Outcome: Progressing   Problem: Nutritional: Goal: Maintenance of adequate nutrition will improve Outcome: Progressing Goal: Progress toward achieving an optimal weight will improve Outcome: Progressing   Problem: Health Behavior/Discharge Planning: Goal: Ability to manage health-related needs will improve Outcome: Progressing Goal: Goals will be collaboratively established with patient/family Outcome: Progressing   Problem: Self-Care: Goal: Ability to participate in self-care as condition permits will improve Outcome: Progressing Goal: Verbalization of feelings and concerns over difficulty with self-care will improve Outcome: Progressing Goal: Ability to communicate needs accurately will improve Outcome: Progressing   Problem: Education: Goal: Knowledge of General Education information will improve Description: Including pain rating scale, medication(s)/side effects and non-pharmacologic comfort measures Outcome: Progressing   Problem: Health Behavior/Discharge Planning: Goal: Ability to manage health-related needs will improve Outcome: Progressing   "

## 2024-04-17 NOTE — Progress Notes (Signed)
 Occupational Therapy Treatment Patient Details Name: Larry Dawson MRN: [id number] DOB: 11/20/1955 Today's Date: 04/17/2024   History of present illness 69 yo M adm 04/01/24 with Rt weakness with increased pontine hemorrhage consistent with cavernoma. 04/13/24 craniotomy. PMHx: admission 12/8-12/10 with ICH. HTN, HLD, COPD, IBS, Hernia repair, T2DM   OT comments  Returned to check fit/tolerance for R UE resting hand splint.  Pt tolerates well, denies discomfort; splint fits well with some indentations from hospital bracelets (that fade within a few minutes).  PT, family and RN notified for night time use (order is already in chart).  Patient worked on grasp/release exercises as below with squeeze foam (yellow) and ball, focusing full grasp and release (limited grasp at time due to sensory deficits).   He does requires cueing for hand placement to complete SROM of  L elbow extension stretch.   Family present at end of session, therefore reviewed brace use, elbow stretch, ball/foam exercises, and visual scanning exercises.  Will follow acutely, continue to recommend >3hrs/day.       If plan is discharge home, recommend the following:  A lot of help with bathing/dressing/bathroom;Assistance with cooking/housework;Assist for transportation;Help with stairs or ramp for entrance;Direct supervision/assist for medications management;Direct supervision/assist for financial management;A lot of help with walking and/or transfers   Equipment Recommendations  Other (comment) (defer to AIR)    Recommendations for Other Services Rehab consult    Precautions / Restrictions Precautions Precautions: Fall;Other (comment) Recall of Precautions/Restrictions: Impaired Precaution/Restrictions Comments: Rt weakness, Rt hearing loss, Rt gaze Required Braces or Orthoses: Other Brace Other Brace: resting hand splint for night time use R UE Restrictions Weight Bearing Restrictions Per Provider Order: No        Mobility Bed Mobility      General bed mobility comments: OOB in recliner    Transfers               Balance                            ADL either performed or assessed with clinical judgement   ADL  General ADL Comments: returned to check splint to R UE    Extremity/Trunk Assessment              Vision       Perception     Praxis     Communication Communication Communication: No apparent difficulties   Cognition Arousal: Alert Behavior During Therapy: WFL for tasks assessed/performed, Impulsive Cognition: Cognition impaired     Awareness: Online awareness impaired Memory impairment (select all impairments): Short-term memory Attention impairment (select first level of impairment): Sustained attention Executive functioning impairment (select all impairments): Problem solving, Sequencing, Organization, Reasoning OT - Cognition Comments: pt is able to recall SROM elbow extension exercise, but needs cueing for technique                 Following commands: Impaired Following commands impaired: Follows one step commands with increased time      Cueing   Cueing Techniques: Verbal cues, Tactile cues, Gestural cues  Exercises Exercises: Other exercises Other Exercises Other Exercises: SROM of R UE into elbow extension and supination with cueing for hand placement and technique x 5 reps. Other Exercises: Grasp of ball/foam x 10, grasp of ball x 10 with release and full hand extension before grabbing ball again.    Shoulder Instructions       General Comments returned to assess splint  fit to R UE, fits well with no areas of redness. Some indentions from bracelets but improved after serveral minutes.  Pt with no complaints of discomfort.  Educated on wearing at night time only.   Worked on exercises below.  Daughter and spouse arrived at end of session- reviewed splint with them, also reviewed exercises for pt to work on over the weekend  using squeeze foam/ball, elbow extension/supination SROM stretch, and vision (scanning to L with head at midline).    Pertinent Vitals/ Pain       Pain Assessment Pain Assessment: Faces Faces Pain Scale: Hurts a little bit Pain Location: R hip Pain Descriptors / Indicators: Guarding Pain Intervention(s): Limited activity within patient's tolerance, Monitored during session, Repositioned  Home Living                                          Prior Functioning/Environment              Frequency  Min 2X/week        Progress Toward Goals  OT Goals(current goals can now be found in the care plan section)  Progress towards OT goals: Progressing toward goals     Plan      Co-evaluation                 AM-PAC OT 6 Clicks Daily Activity     Outcome Measure   Help from another person eating meals?: A Little Help from another person taking care of personal grooming?: A Little Help from another person toileting, which includes using toliet, bedpan, or urinal?: Total Help from another person bathing (including washing, rinsing, drying)?: A Lot Help from another person to put on and taking off regular upper body clothing?: A Lot Help from another person to put on and taking off regular lower body clothing?: A Lot 6 Click Score: 13    End of Session Equipment Utilized During Treatment: Gait belt;Other (comment) (stedy)  OT Visit Diagnosis: Unsteadiness on feet (R26.81);Other abnormalities of gait and mobility (R26.89);Muscle weakness (generalized) (M62.81);Low vision, both eyes (H54.2);Other symptoms and signs involving the nervous system (R29.898);Hemiplegia and hemiparesis Hemiplegia - Right/Left: Right Hemiplegia - dominant/non-dominant: Dominant Hemiplegia - caused by: Nontraumatic intracerebral hemorrhage   Activity Tolerance Patient tolerated treatment well   Patient Left in chair;with call bell/phone within reach;with chair alarm set;with  family/visitor present   Nurse Communication Mobility status;Precautions (resting hand splint at night)        Time: 8795-8774 OT Time Calculation (min): 21 min  Charges: OT General Charges $OT Visit: 1 Visit OT Treatments $Neuromuscular Re-education: 8-22 mins  Etta NOVAK, OT Acute Rehabilitation Services Office [phone] Secure Chat Preferred    Etta GORMAN Hope 04/17/2024, 1:08 PM

## 2024-04-18 ENCOUNTER — Inpatient Hospital Stay (HOSPITAL_COMMUNITY)

## 2024-04-18 LAB — CBC WITH DIFFERENTIAL/PLATELET
Abs Immature Granulocytes: 0.04 K/uL (ref 0.00–0.07)
Basophils Absolute: 0.1 K/uL (ref 0.0–0.1)
Basophils Relative: 1 %
Eosinophils Absolute: 0.1 K/uL (ref 0.0–0.5)
Eosinophils Relative: 1 %
HCT: 38.4 % — ABNORMAL LOW (ref 39.0–52.0)
Hemoglobin: 13 g/dL (ref 13.0–17.0)
Immature Granulocytes: 1 %
Lymphocytes Relative: 29 %
Lymphs Abs: 2 K/uL (ref 0.7–4.0)
MCH: 27.9 pg (ref 26.0–34.0)
MCHC: 33.9 g/dL (ref 30.0–36.0)
MCV: 82.4 fL (ref 80.0–100.0)
Monocytes Absolute: 0.6 K/uL (ref 0.1–1.0)
Monocytes Relative: 9 %
Neutro Abs: 4.2 K/uL (ref 1.7–7.7)
Neutrophils Relative %: 59 %
Platelets: 277 K/uL (ref 150–400)
RBC: 4.66 MIL/uL (ref 4.22–5.81)
RDW: 12.5 % (ref 11.5–15.5)
WBC: 7 K/uL (ref 4.0–10.5)
nRBC: 0 % (ref 0.0–0.2)

## 2024-04-18 LAB — BASIC METABOLIC PANEL WITH GFR
Anion gap: 9 (ref 5–15)
BUN: 16 mg/dL (ref 8–23)
CO2: 25 mmol/L (ref 22–32)
Calcium: 8.9 mg/dL (ref 8.9–10.3)
Chloride: 104 mmol/L (ref 98–111)
Creatinine, Ser: 0.95 mg/dL (ref 0.61–1.24)
GFR, Estimated: >60 mL/min
Glucose, Bld: 98 mg/dL (ref 70–99)
Potassium: 3.6 mmol/L (ref 3.5–5.1)
Sodium: 138 mmol/L (ref 135–145)

## 2024-04-18 LAB — GLUCOSE, CAPILLARY
Glucose-Capillary: 98 mg/dL (ref 70–99)
Glucose-Capillary: 141 mg/dL — ABNORMAL HIGH (ref 70–99)
Glucose-Capillary: 88 mg/dL (ref 70–99)
Glucose-Capillary: 86 mg/dL (ref 70–99)

## 2024-04-18 MED ORDER — INSULIN GLARGINE 100 UNIT/ML ~~LOC~~ SOLN
8.0000 [IU] | Freq: Every day | SUBCUTANEOUS | Status: DC
  Administered 2024-04-18 – 2024-04-19 (×2): 8 [IU] via SUBCUTANEOUS
  Filled 2024-04-18 (×3): qty 0.08

## 2024-04-18 NOTE — Progress Notes (Signed)
" °  Progress Note   Patient: Larry Dawson FMW:[id number] DOB: 1955/05/15 DOA: 04/01/2024     16 DOS: the patient was seen and examined on 04/18/2024   Brief hospital course: Brainstem cavernoma  Assessment and Plan: Patient seems to be doing miraculously well on postop day 6.  His incision is dry.  His right upper extremity weakness continues to progressively improve.  He can move his fingers readily and has very good strength in it.  The fine motor strength is still lacking and so was the coordination.  Right lower extremity function is also slightly improving.  His eye deviation to the right is also improved and he can come to the midline.  The right eye can also cross the midline to the left.  The left eye cannot.  He is awaiting transfer to rehab.  I have no objections to that. He can be transferred to the floor as far as I am concerned.      Subjective: He feels great.  For most of the day he is out of bed.  He says that he is not in any pain.  Physical Exam: Vitals:   04/17/24 1948 04/17/24 2316 04/18/24 0310 04/18/24 0742  BP: 135/73 135/72 129/67 (!) 143/77  Pulse: 71 65 71 69  Resp: 20 20 20 20   Temp: 97.9 F (36.6 C) 98 F (36.7 C) 98.2 F (36.8 C) 98.1 F (36.7 C)  TempSrc: Oral Oral Oral Oral  SpO2: 94% 92% 92% 91%  Weight:      Height:       I reviewed the labs and they look good.  There are no major abnormalities.  Data Reviewed:  Reviewed.  Disposition: Status is: Inpatient Remains inpatient appropriate because: Awaiting transfer to rehab.     Time spent: 35 minutes  Author: Dino Sable, MD 04/18/2024 9:26 AM  For on call review [email or web address].    "

## 2024-04-18 NOTE — Plan of Care (Signed)
 " Problem: Education: Goal: Ability to describe self-care measures that may prevent or decrease complications (Diabetes Survival Skills Education) will improve 04/18/2024 0020 by Marvis Kenneth SAILOR, RN Outcome: Progressing 04/17/2024 2039 by Marvis Kenneth SAILOR, RN Outcome: Progressing Goal: Individualized Educational Video(s) 04/18/2024 0020 by Marvis Kenneth SAILOR, RN Outcome: Progressing 04/17/2024 2039 by Marvis Kenneth SAILOR, RN Outcome: Progressing   Problem: Coping: Goal: Ability to adjust to condition or change in health will improve 04/18/2024 0020 by Marvis Kenneth SAILOR, RN Outcome: Progressing 04/17/2024 2039 by Marvis Kenneth SAILOR, RN Outcome: Progressing   Problem: Fluid Volume: Goal: Ability to maintain a balanced intake and output will improve 04/18/2024 0020 by Marvis Kenneth SAILOR, RN Outcome: Progressing 04/17/2024 2039 by Marvis Kenneth SAILOR, RN Outcome: Progressing   Problem: Health Behavior/Discharge Planning: Goal: Ability to identify and utilize available resources and services will improve 04/18/2024 0020 by Marvis Kenneth SAILOR, RN Outcome: Progressing 04/17/2024 2039 by Marvis Kenneth SAILOR, RN Outcome: Progressing Goal: Ability to manage health-related needs will improve 04/18/2024 0020 by Marvis Kenneth SAILOR, RN Outcome: Progressing 04/17/2024 2039 by Marvis Kenneth SAILOR, RN Outcome: Progressing   Problem: Metabolic: Goal: Ability to maintain appropriate glucose levels will improve 04/18/2024 0020 by Marvis Kenneth SAILOR, RN Outcome: Progressing 04/17/2024 2039 by Marvis Kenneth SAILOR, RN Outcome: Progressing   Problem: Nutritional: Goal: Maintenance of adequate nutrition will improve 04/18/2024 0020 by Marvis Kenneth SAILOR, RN Outcome: Progressing 04/17/2024 2039 by Marvis Kenneth SAILOR, RN Outcome: Progressing Goal: Progress toward achieving an optimal weight will improve 04/18/2024 0020 by Marvis Kenneth SAILOR, RN Outcome: Progressing 04/17/2024 2039 by Marvis Kenneth SAILOR,  RN Outcome: Progressing   Problem: Skin Integrity: Goal: Risk for impaired skin integrity will decrease 04/18/2024 0020 by Marvis Kenneth SAILOR, RN Outcome: Progressing 04/17/2024 2039 by Marvis Kenneth SAILOR, RN Outcome: Progressing   Problem: Tissue Perfusion: Goal: Adequacy of tissue perfusion will improve 04/18/2024 0020 by Marvis Kenneth SAILOR, RN Outcome: Progressing 04/17/2024 2039 by Marvis Kenneth SAILOR, RN Outcome: Progressing   Problem: Education: Goal: Knowledge of secondary prevention will improve (MUST DOCUMENT ALL) 04/18/2024 0020 by Marvis Kenneth SAILOR, RN Outcome: Progressing 04/17/2024 2039 by Marvis Kenneth SAILOR, RN Outcome: Progressing   Problem: Spontaneous Subarachnoid Hemorrhage Tissue Perfusion: Goal: Complications of Spontaneous Subarachnoid Hemorrhage will be minimized 04/18/2024 0020 by Marvis Kenneth SAILOR, RN Outcome: Progressing 04/17/2024 2039 by Marvis Kenneth SAILOR, RN Outcome: Progressing   Problem: Health Behavior/Discharge Planning: Goal: Ability to manage health-related needs will improve 04/18/2024 0020 by Marvis Kenneth SAILOR, RN Outcome: Progressing 04/17/2024 2039 by Marvis Kenneth SAILOR, RN Outcome: Progressing Goal: Goals will be collaboratively established with patient/family 04/18/2024 0020 by Marvis Kenneth SAILOR, RN Outcome: Progressing 04/17/2024 2039 by Marvis Kenneth SAILOR, RN Outcome: Progressing   Problem: Self-Care: Goal: Ability to participate in self-care as condition permits will improve 04/18/2024 0020 by Marvis Kenneth SAILOR, RN Outcome: Progressing 04/17/2024 2039 by Marvis Kenneth SAILOR, RN Outcome: Progressing Goal: Verbalization of feelings and concerns over difficulty with self-care will improve 04/18/2024 0020 by Marvis Kenneth SAILOR, RN Outcome: Progressing 04/17/2024 2039 by Marvis Kenneth SAILOR, RN Outcome: Progressing Goal: Ability to communicate needs accurately will improve 04/18/2024 0020 by Marvis Kenneth SAILOR, RN Outcome: Progressing 04/17/2024  2039 by Marvis Kenneth SAILOR, RN Outcome: Progressing   Problem: Education: Goal: Knowledge of General Education information will improve Description: Including pain rating scale, medication(s)/side effects and non-pharmacologic comfort measures 04/18/2024 0020 by Marvis Kenneth SAILOR, RN Outcome: Progressing 04/17/2024 2039 by Marvis Kenneth SAILOR, RN Outcome: Progressing   Problem: Health  Behavior/Discharge Planning: Goal: Ability to manage health-related needs will improve 04/18/2024 0020 by Marvis Kenneth SAILOR, RN Outcome: Progressing 04/17/2024 2039 by Marvis Kenneth SAILOR, RN Outcome: Progressing   Problem: Clinical Measurements: Goal: Ability to maintain clinical measurements within normal limits will improve 04/18/2024 0020 by Marvis Kenneth SAILOR, RN Outcome: Progressing 04/17/2024 2039 by Marvis Kenneth SAILOR, RN Outcome: Progressing Goal: Will remain free from infection 04/18/2024 0020 by Marvis Kenneth SAILOR, RN Outcome: Progressing 04/17/2024 2039 by Marvis Kenneth SAILOR, RN Outcome: Progressing Goal: Diagnostic test results will improve 04/18/2024 0020 by Marvis Kenneth SAILOR, RN Outcome: Progressing 04/17/2024 2039 by Marvis Kenneth SAILOR, RN Outcome: Progressing Goal: Respiratory complications will improve 04/18/2024 0020 by Marvis Kenneth SAILOR, RN Outcome: Progressing 04/17/2024 2039 by Marvis Kenneth SAILOR, RN Outcome: Progressing Goal: Cardiovascular complication will be avoided 04/18/2024 0020 by Marvis Kenneth SAILOR, RN Outcome: Progressing 04/17/2024 2039 by Marvis Kenneth SAILOR, RN Outcome: Progressing   Problem: Activity: Goal: Risk for activity intolerance will decrease 04/18/2024 0020 by Marvis Kenneth SAILOR, RN Outcome: Progressing 04/17/2024 2039 by Marvis Kenneth SAILOR, RN Outcome: Progressing   Problem: Nutrition: Goal: Adequate nutrition will be maintained 04/18/2024 0020 by Marvis Kenneth SAILOR, RN Outcome: Progressing 04/17/2024 2039 by Marvis Kenneth SAILOR, RN Outcome: Progressing    Problem: Coping: Goal: Level of anxiety will decrease 04/18/2024 0020 by Marvis Kenneth SAILOR, RN Outcome: Progressing 04/17/2024 2039 by Marvis Kenneth SAILOR, RN Outcome: Progressing   Problem: Elimination: Goal: Will not experience complications related to bowel motility 04/18/2024 0020 by Marvis Kenneth SAILOR, RN Outcome: Progressing 04/17/2024 2039 by Marvis Kenneth SAILOR, RN Outcome: Progressing Goal: Will not experience complications related to urinary retention 04/18/2024 0020 by Marvis Kenneth SAILOR, RN Outcome: Progressing 04/17/2024 2039 by Marvis Kenneth SAILOR, RN Outcome: Progressing   Problem: Pain Managment: Goal: General experience of comfort will improve and/or be controlled 04/18/2024 0020 by Marvis Kenneth SAILOR, RN Outcome: Progressing 04/17/2024 2039 by Marvis Kenneth SAILOR, RN Outcome: Progressing   Problem: Safety: Goal: Ability to remain free from injury will improve 04/18/2024 0020 by Marvis Kenneth SAILOR, RN Outcome: Progressing 04/17/2024 2039 by Marvis Kenneth SAILOR, RN Outcome: Progressing   Problem: Skin Integrity: Goal: Risk for impaired skin integrity will decrease 04/18/2024 0020 by Marvis Kenneth SAILOR, RN Outcome: Progressing 04/17/2024 2039 by Marvis Kenneth SAILOR, RN Outcome: Progressing   Problem: Education: Goal: Knowledge of the prescribed therapeutic regimen will improve 04/18/2024 0020 by Marvis Kenneth SAILOR, RN Outcome: Progressing 04/17/2024 2039 by Marvis Kenneth SAILOR, RN Outcome: Progressing   Problem: Bowel/Gastric: Goal: Gastrointestinal status for postoperative course will improve 04/18/2024 0020 by Marvis Kenneth SAILOR, RN Outcome: Progressing 04/17/2024 2039 by Marvis Kenneth SAILOR, RN Outcome: Progressing   Problem: Cardiac: Goal: Ability to maintain an adequate cardiac output 04/18/2024 0020 by Marvis Kenneth SAILOR, RN Outcome: Progressing 04/17/2024 2039 by Marvis Kenneth SAILOR, RN Outcome: Progressing Goal: Will show no evidence of cardiac arrhythmias 04/18/2024  0020 by Marvis Kenneth SAILOR, RN Outcome: Progressing 04/17/2024 2039 by Marvis Kenneth SAILOR, RN Outcome: Progressing   "

## 2024-04-18 NOTE — Progress Notes (Signed)
 Orthopedic Tech Progress Note Patient Details:  Larry Dawson 1956-02-28 [id number] Knee immobilizer was sized and left at bedside.  Ortho Devices Type of Ortho Device: Knee Immobilizer Ortho Device/Splint Location: RLE Ortho Device/Splint Interventions: Application   Post Interventions Patient Tolerated: Well  Massie BRAVO Larry Dawson 04/18/2024, 3:40 PM

## 2024-04-18 NOTE — Progress Notes (Signed)
 Physical Therapy Treatment Patient Details Name: Larry Dawson MRN: [id number] DOB: [birth year] Today's Date: 04/18/2024   History of Present Illness 69 yo M adm 04/01/24 with Rt weakness with increased pontine hemorrhage consistent with cavernoma. 04/13/24 craniotomy. PMHx: admission 12/8-12/10 with ICH. HTN, HLD, COPD, IBS, Hernia repair, T2DM    PT Comments  Pt seated in bed on arrival this session.  Pt continues to require external assistance with R lower and upper extremity.  Performed toilet transfer and required assistance to perform pericare as he is R handed and unable to maintain balance without B UE support at this time.  Pt focused on LE strengthening in supine with emphasis on eccentric load.  Pt with c/o R tricep tightness.  Showed him how to performed slef assisted stretch to R UE.     If plan is discharge home, recommend the following: A lot of help with bathing/dressing/bathroom;Assist for transportation;Help with stairs or ramp for entrance;Assistance with cooking/housework;Two people to help with walking and/or transfers   Can travel by private Theme Park [employer] (measurements PT);Wheelchair cushion (measurements PT);BSC/3in1    Recommendations for Other Services Rehab consult     Precautions / Restrictions Precautions Precautions: Fall;Other (comment) Recall of Precautions/Restrictions: Impaired Precaution/Restrictions Comments: Rt weakness, Rt hearing loss, Rt gaze Required Braces or Orthoses: Other Brace Other Brace: resting hand splint for night time use R UE Restrictions Weight Bearing Restrictions Per Provider Order: No     Mobility  Bed Mobility Overal bed mobility: Needs Assistance Bed Mobility: Sit to Supine       Sit to supine: Mod assist   General bed mobility comments: Assistance for upper trunk control and lowering to bed.  Presents with LOB posterior but presents with slow righting response and assistance to  keep feet grounded to engage core to sit back up.    Transfers Overall transfer level: Needs assistance Equipment used: Ambulation equipment used Transfers: Sit to/from Stand Sit to Stand: Min assist           General transfer comment: Cues for hand placement and hand over hand support on R UE to keep R hand grasped around stedy cross bar.  Pt Present with intermittent buckling in R LE.  Pt eager to stand but fatigues quickly in stance.  Cues for hip and trunk extension, R hand grasp and R quad activation.    Ambulation/Gait                   Stairs             Wheelchair Mobility     Tilt Bed    Modified Rankin (Stroke Patients Only)       Balance     Sitting balance-Leahy Scale: Poor       Standing balance-Leahy Scale: Poor                              Communication Communication Communication: No apparent difficulties  Cognition Arousal: Alert Behavior During Therapy: WFL for tasks assessed/performed, Impulsive   PT - Cognitive impairments: Safety/Judgement, Problem solving, Awareness                       PT - Cognition Comments: pt impulsive at times attempting to move to EOB or rise prior to therapist moving lines or rail Following commands: Impaired Following commands impaired: Follows one step commands with  increased time    Cueing Cueing Techniques: Verbal cues, Tactile cues, Gestural cues  Exercises General Exercises - Lower Extremity Ankle Circles/Pumps: AROM, Both, 20 reps, Supine Quad Sets: AROM, Both, 10 reps, Supine Hip ABduction/ADduction: AAROM, Right, 10 reps, Supine Straight Leg Raises: AAROM, Right, 10 reps, Supine, Limitations Straight Leg Raises Limitations: extensor lag noted    General Comments        Pertinent Vitals/Pain Pain Assessment Pain Assessment: Faces Faces Pain Scale: Hurts little more Pain Location: R tricep Pain Descriptors / Indicators: Spasm, Tightness Pain  Intervention(s): Monitored during session, Repositioned    Home Living                          Prior Function            PT Goals (current goals can now be found in the care plan section) Acute Rehab PT Goals Patient Stated Goal: return to walking and home Potential to Achieve Goals: Fair Progress towards PT goals: Progressing toward goals    Frequency    Min 4X/week      PT Plan      Co-evaluation              AM-PAC PT 6 Clicks Mobility   Outcome Measure  Help needed turning from your back to your side while in a flat bed without using bedrails?: A Little Help needed moving from lying on your back to sitting on the side of a flat bed without using bedrails?: A Lot Help needed moving to and from a bed to a chair (including a wheelchair)?: Total Help needed standing up from a chair using your arms (e.g., wheelchair or bedside chair)?: Total Help needed to walk in hospital room?: Total Help needed climbing 3-5 steps with a railing? : Total 6 Click Score: 9    End of Session Equipment Utilized During Treatment: Gait belt Activity Tolerance: Patient tolerated treatment well Patient left: with call bell/phone within reach;with nursing/sitter in room;in bed;with bed alarm set Nurse Communication: Mobility status;Need for lift equipment PT Visit Diagnosis: Unsteadiness on feet (R26.81);Muscle weakness (generalized) (M62.81);Difficulty in walking, not elsewhere classified (R26.2);Hemiplegia and hemiparesis;Other abnormalities of gait and mobility (R26.89) Hemiplegia - Right/Left: Right Hemiplegia - dominant/non-dominant: Dominant Hemiplegia - caused by: Nontraumatic intracerebral hemorrhage     Time: 1346-1429 PT Time Calculation (min) (ACUTE ONLY): 43 min  Charges:    $Therapeutic Exercise: 8-22 mins $Therapeutic Activity: 23-37 mins PT General Charges $$ ACUTE PT VISIT: 1 Visit                     Toya HAMS , PTA Acute Rehabilitation  Services Office [phone]    Janna Oak JINNY Gosling 04/18/2024, 2:45 PM

## 2024-04-18 NOTE — Progress Notes (Signed)
 [facility]  PROGRESS NOTE  Larry Dawson FMW:[id number] DOB: 1955/05/07 DOA: 04/01/2024 PCP: [name], MD   Brief HPI:   69 year old man w/ hx of COPD, HTN, HLD presenting with imbalance and ill-defined feelings of numbness throughout head. 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 He arrives to ICU for further management.     Assessment/Plan:   Symptomatic brainstem covernoma  -S/P posterior fossa cranitomy and resection on 04/14/23 by Dr. Janjua - Patient's right-sided weakness is improving   GERD - Continue pantoprazole   Hypertension Keep SBP below 150 .  IV hydralazine  PRN ordered. Continue atenolol  50 mg daily - Added amlodipine  5 mg daily    COPD Stable, no signs of acute exacerbation.  Does not appear to be on inhalers at home.   Type 2 diabetes Hemoglobin A1c 7.2 on 01/06/2024.   Placed on sensitive sliding scale insulin  every 4 hours. -CBG mildly low - Added Lantus  10 units subcu daily;  will cut down Lantus  to 8 units subcu daily       DVT prophylaxis: Heparin   Medications     amLODipine   5 mg Oral Daily   artificial tears  1 drop Left Eye Q2H while awake   atenolol   50 mg Oral Daily   calcium  carbonate  400 mg of elemental calcium  Oral BID   Chlorhexidine  Gluconate Cloth  6 each Topical Daily   docusate  100 mg Oral BID   feeding supplement  237 mL Oral BID BM   heparin  injection (subcutaneous)  5,000 Units Subcutaneous Q12H   insulin  aspart  0-9 Units Subcutaneous TID AC & HS   insulin  glargine  10 Units Subcutaneous QHS   metFORMIN   500 mg Oral BID WC   pantoprazole   40 mg Oral QHS   polyethylene glycol  17 g Oral Daily   rosuvastatin   20 mg Oral QHS   senna-docusate  1 tablet Oral QHS     Data Reviewed:   CBG:  Recent Labs  Lab 04/17/24 0815 04/17/24 1106 04/17/24 1657 04/17/24 2142 04/18/24 0741  GLUCAP 128* 125* 100* 104* 98    SpO2:  91 % O2 Flow Rate (L/min): 2 L/min    Vitals:   04/17/24 1948 04/17/24 2316 04/18/24 0310 04/18/24 0742  BP: 135/73 135/72 129/67 (!) 143/77  Pulse: 71 65 71 69  Resp: 20 20 20 20   Temp: 97.9 F (36.6 C) 98 F (36.7 C) 98.2 F (36.8 C) 98.1 F (36.7 C)  TempSrc: Oral Oral Oral Oral  SpO2: 94% 92% 92% 91%  Weight:      Height:          Data Reviewed:  Basic Metabolic Panel: Recent Labs  Lab 04/14/24 0500 04/15/24 0546 04/16/24 0708 04/17/24 0629 04/18/24 0433  NA 141 141 139 136 138  K 3.9 4.1 3.6 4.0 3.6  CL 106 109 107 107 104  CO2 21* 22 24 18* 25  GLUCOSE 157* 122* 100* 112* 98  BUN 16 18 17 15 16   CREATININE 1.15 0.99 1.02 0.93 0.95  CALCIUM  9.1 8.7* 8.8* 8.8* 8.9    CBC: Recent Labs  Lab 04/14/24 0500 04/15/24 0546 04/16/24 0708 04/17/24 0629 04/18/24 0433  WBC 14.3* 15.1* 9.2 6.1 7.0  NEUTROABS 12.5* 12.1* 5.8 3.6 4.2  HGB 13.6 12.7* 13.3 13.3 13.0  HCT 40.3 38.0* 40.4 40.4 38.4*  MCV 83.6 83.2 84.2 84.7 82.4  PLT 269 256 244 217 277  LFT No results for input(s): AST, ALT, ALKPHOS, BILITOT, PROT, ALBUMIN  in the last 168 hours.   Antibiotics: Anti-infectives (From admission, onward)    Start     Dose/Rate Route Frequency Ordered Stop   04/13/24 2200  ceFAZolin  (ANCEF ) IVPB 2g/100 mL premix        2 g 200 mL/hr over 30 Minutes Intravenous Every 8 hours 04/13/24 1549 04/14/24 1400   04/13/24 0941  vancomycin  (VANCOCIN ) powder  Status:  Discontinued          As needed 04/13/24 0942 04/13/24 1405   04/13/24 0645  ceFAZolin  (ANCEF ) IVPB 2g/100 mL premix        2 g 200 mL/hr over 30 Minutes Intravenous  Once 04/13/24 0550 04/13/24 1300        CONSULTS heparin   Code Status: Full code  Family Communication:      Subjective   Patient seen and examined.  Feels better this morning.  Right upper extremity strength is improving.  Objective    Physical Examination:   General-appears in no acute  distress Heart-S1-S2, regular, no murmur auscultated Lungs-clear to auscultation bilaterally, no wheezing or crackles auscultated Abdomen-soft, nontender, no organomegaly Extremities-no edema in the lower extremities Neuro-alert, oriented x3, right-sided weakness            Larry Dawson S Larry Dawson   [facility] If 7PM-7AM, please contact night-coverage at [email or web address], Office  [phone]   04/18/2024, 9:44 AM  LOS: 16 days

## 2024-04-19 DIAGNOSIS — J449 Chronic obstructive pulmonary disease, unspecified: Secondary | ICD-10-CM

## 2024-04-19 DIAGNOSIS — E1159 Type 2 diabetes mellitus with other circulatory complications: Secondary | ICD-10-CM

## 2024-04-19 DIAGNOSIS — I152 Hypertension secondary to endocrine disorders: Secondary | ICD-10-CM

## 2024-04-19 LAB — GLUCOSE, CAPILLARY
Glucose-Capillary: 107 mg/dL — ABNORMAL HIGH (ref 70–99)
Glucose-Capillary: 115 mg/dL — ABNORMAL HIGH (ref 70–99)
Glucose-Capillary: 86 mg/dL (ref 70–99)
Glucose-Capillary: 131 mg/dL — ABNORMAL HIGH (ref 70–99)

## 2024-04-19 NOTE — Progress Notes (Signed)
 69yo gentleman with a brainstem cavernoma  Vitals:   04/19/24 0300 04/19/24 0727  BP: 125/66 115/60  Pulse: 70 67  Resp: 20 18  Temp: 98.7 F (37.1 C) 98.5 F (36.9 C)  SpO2: 92% 93%   No change in neurological exam in comparison to yesterday.  Await transfer to rehab.

## 2024-04-19 NOTE — Progress Notes (Signed)
 Physical Therapy Treatment Patient Details Name: Larry Dawson MRN: [id number] DOB: Dec 04, 1955 Today's Date: 04/19/2024   History of Present Illness 69 yo M adm 04/01/24 with Rt weakness with increased pontine hemorrhage consistent with cavernoma. 04/13/24 craniotomy. PMHx: admission 12/8-12/10 with ICH. HTN, HLD, COPD, IBS, Hernia repair, T2DM    PT Comments  Focused session on transfer and gait training, progressing pt to using a RW this date. He displays continued deficits in R leg strength, worse distally than proximally. This is resulting in his R knee buckling during stance phase and needing it to be blocked with all standing mobility. An ACE wrap was applied to facilitate ankle dorsiflexion during this session to prevent toe drag and reduce risk of an ankle inversion injury as well. The pt demonstrated some good carryover of cues on how to scoot to the edge of the seat and on hand placement with transfers. He needed modA to power up to stand and balance each rep. He needed maxA to step anterior <> posterior repeatedly with his L leg and intermittently his R while standing in front of the recliner. He benefited from multi-modal cues and step-by-step cuing to sequence extending his R knee prior to shifting weight to his R and stepping with his L. Will continue to follow acutely.        If plan is discharge home, recommend the following: A lot of help with bathing/dressing/bathroom;Assist for transportation;Help with stairs or ramp for entrance;Assistance with cooking/housework;Two people to help with walking and/or transfers   Can travel by private Theme Park Manager (measurements PT);Wheelchair cushion (measurements PT);BSC/3in1    Recommendations for Other Services Rehab consult     Precautions / Restrictions Precautions Precautions: Fall;Other (comment) Recall of Precautions/Restrictions: Impaired Precaution/Restrictions Comments: Rt weakness, Rt  hearing loss, Rt gaze Required Braces or Orthoses: Other Brace Other Brace: resting hand splint for night time use R UE Restrictions Weight Bearing Restrictions Per Provider Order: No     Mobility  Bed Mobility               General bed mobility comments: Pt sitting in chair upon arrival and at end of session.    Transfers Overall transfer level: Needs assistance Equipment used: Rolling walker (2 wheels), 1 person hand held assist Transfers: Sit to/from Stand Sit to Stand: Mod assist           General transfer comment: Cued pt to scoot to edge of seat prior to each transfer. Pt needed cues to shift weight laterally and scoot contralateral hip anteriorly to scoot forward. Needs cues to place R hand on RW and L hand on chair arm rest to push up to stand. Cues provided for pt to match R foot placement to his L as well. ACE wrap applied to R ankle to facilitate dorsiflexion and prevent inversion during standing mobility, removed end of session. R knee blocked each transfer. x7 reps total from recliner with modA each rep to power up to stand and gain balance. x2 with R lateral approach/assist with RW, x2 with anterior approach/assist with RW, x3 with anterior approach with HHA    Ambulation/Gait Ambulation/Gait assistance: Max assist, +2 safety/equipment Gait Distance (Feet): 3 Feet (x7 bouts of stepping anterior <> posterior 2-3x each bout while standing anterior to chair) Assistive device: Rolling walker (2 wheels), 1 person hand held assist Gait Pattern/deviations: Step-to pattern, Decreased stance time - right, Decreased step length - left, Decreased dorsiflexion -  right, Ataxic, Decreased step length - right, Trunk flexed, Decreased weight shift to right, Decreased weight shift to left, Knees buckling Gait velocity: dec Gait velocity interpretation: <1.31 ft/sec, indicative of household ambulator   General Gait Details: ACE wrap applied to R ankle to facilitate dorsiflexion  and prevent inversion during standing mobility, removed end of session. Practiced anterior <> posterior stepping while standing anterior to recliner. Pt stepping anterior <> posterior 2-3x each bout, x7 bouts total (x4 with RW, x3 with HHA). This was primarily with his L leg while R leg was provided verbal and tactile cues to extend during stance phase. Tactile cues provided for weight shifting bil and to extend hips and trunk for upright balance. x2 steps anterior <> posterior with R leg with pt needing maxA to lift and advance R leg each time. MaxA for balance each gait training bout.   Stairs             Wheelchair Mobility     Tilt Bed    Modified Rankin (Stroke Patients Only) Modified Rankin (Stroke Patients Only) Pre-Morbid Rankin Score: No significant disability Modified Rankin: Severe disability     Balance Overall balance assessment: Needs assistance Sitting-balance support: Bilateral upper extremity supported, Feet supported Sitting balance-Leahy Scale: Poor Sitting balance - Comments: UE support sitting statically in chair with back unsupported   Standing balance support: Bilateral upper extremity supported, During functional activity Standing balance-Leahy Scale: Poor Standing balance comment: relies on external and UE support                            Communication Communication Communication: Impaired Factors Affecting Communication: Hearing impaired;Reduced clarity of speech  Cognition Arousal: Alert Behavior During Therapy: WFL for tasks assessed/performed   PT - Cognitive impairments: Safety/Judgement, Problem solving, Awareness, Sequencing                       PT - Cognition Comments: Needs reminders to attend to his R side and place his R UE and leg appropriately for transfers. Needs multi-modal simple cues to sequence mobility Following commands: Impaired Following commands impaired: Follows one step commands with increased time,  Only follows one step commands consistently, Follows multi-step commands inconsistently    Cueing Cueing Techniques: Verbal cues, Tactile cues, Gestural cues  Exercises      General Comments General comments (skin integrity, edema, etc.): Encouraged hip flexion, knee extension, and ankle dorsiflexion AROM R leg during day to improve strength      Pertinent Vitals/Pain Pain Assessment Pain Assessment: Faces Faces Pain Scale: Hurts a little bit Pain Location: R ankle with eversion, not with weight bearing Pain Descriptors / Indicators: Discomfort, Sore Pain Intervention(s): Limited activity within patient's tolerance, Monitored during session, Repositioned    Home Living                          Prior Function            PT Goals (current goals can now be found in the care plan section) Acute Rehab PT Goals Patient Stated Goal: return to walking and home PT Goal Formulation: With patient Time For Goal Achievement: 04/28/24 Potential to Achieve Goals: Fair Progress towards PT goals: Progressing toward goals    Frequency    Min 4X/week      PT Plan      Co-evaluation  AM-PAC PT 6 Clicks Mobility   Outcome Measure  Help needed turning from your back to your side while in a flat bed without using bedrails?: A Little Help needed moving from lying on your back to sitting on the side of a flat bed without using bedrails?: A Lot Help needed moving to and from a bed to a chair (including a wheelchair)?: Total Help needed standing up from a chair using your arms (e.g., wheelchair or bedside chair)?: A Lot Help needed to walk in hospital room?: Total Help needed climbing 3-5 steps with a railing? : Total 6 Click Score: 10    End of Session Equipment Utilized During Treatment: Gait belt Activity Tolerance: Patient tolerated treatment well Patient left: with call bell/phone within reach;in chair;with chair alarm set Nurse Communication:  Mobility status;Need for lift equipment (stedy) PT Visit Diagnosis: Unsteadiness on feet (R26.81);Muscle weakness (generalized) (M62.81);Difficulty in walking, not elsewhere classified (R26.2);Hemiplegia and hemiparesis;Other abnormalities of gait and mobility (R26.89) Hemiplegia - Right/Left: Right Hemiplegia - dominant/non-dominant: Dominant Hemiplegia - caused by: Nontraumatic intracerebral hemorrhage     Time: 1116-1150 PT Time Calculation (min) (ACUTE ONLY): 34 min  Charges:    $Gait Training: 8-22 mins $Therapeutic Activity: 8-22 mins PT General Charges $$ ACUTE PT VISIT: 1 Visit                     Theo Ferretti, PT, DPT Acute Rehabilitation Services  Office: [phone]    Theo CHRISTELLA Ferretti 04/19/2024, 6:23 PM

## 2024-04-19 NOTE — Progress Notes (Signed)
 "        [facility]                                                                               Larry Dawson, is a 69 y.o. male, DOB - 11-02-1955, FMW:[id number] Admit date - 04/01/2024    Outpatient Primary MD for the patient is Sagardia, Emil Schanz, MD  LOS - 17  days    Brief summary   69 y.o. 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  He is currently waiting for CIR placement.    Assessment & Plan    Assessment and Plan:    Symptomatic brainstem covernoma  -S/P posterior fossa cranitomy and resection on 04/14/23 by Dr. [name] Patients weakness on the right , slowly improving.  Working with PT, recommending CIR.      GERD - Continue pantoprazole    Hypertension Optimal BP parameters    COPD Stable, no signs of acute exacerbation.  Does not appear to be on inhalers at home.   Type 2 diabetes Hemoglobin A1c 7.2 on 01/06/2024.   CBG (last 3)  Recent Labs    04/18/24 2128 04/19/24 0725 04/19/24 1211  GLUCAP 86 107* 115*   Cbg's are optimal .  No changes in meds today.    Estimated body mass index is 35.29 kg/m as calculated from the following:   Height as of this encounter: 5' 7 (1.702 m).   Weight as of this encounter: 102.2 kg.  Code Status: full code.  DVT Prophylaxis:  heparin  injection 5,000 Units Start: 04/14/24 1800 SCDs Start: 04/13/24 1550 SCDs Start: 04/02/24 0050   Level of Care: Level of care: Progressive Family Communication: none at bedside.   Disposition Plan:     Remains inpatient appropriate:  pending CIR.   Procedures:  S/p craniotomy.   Consultants:   NS PCCM.   Antimicrobials:   Anti-infectives (From admission, onward)    Start     Dose/Rate Route Frequency Ordered Stop   04/13/24 2200  ceFAZolin  (ANCEF ) IVPB 2g/100 mL premix        2 g 200  mL/hr over 30 Minutes Intravenous Every 8 hours 04/13/24 1549 04/14/24 1400   04/13/24 0941  vancomycin  (VANCOCIN ) powder  Status:  Discontinued          As needed 04/13/24 0942 04/13/24 1405   04/13/24 0645  ceFAZolin  (ANCEF ) IVPB 2g/100 mL premix        2 g 200 mL/hr over 30 Minutes Intravenous  Once 04/13/24 0550 04/13/24 1300        Medications  Scheduled Meds:  amLODipine   5 mg Oral Daily   artificial tears  1 drop Left Eye Q2H while awake   atenolol   50 mg Oral Daily   docusate  100 mg Oral BID   feeding supplement  237 mL Oral BID BM   heparin  injection (subcutaneous)  5,000 Units Subcutaneous Q12H   insulin  aspart  0-9 Units Subcutaneous TID AC & HS   insulin  glargine  8 Units Subcutaneous QHS   metFORMIN   500 mg Oral BID WC   pantoprazole   40 mg Oral QHS   polyethylene glycol  17 g Oral Daily   rosuvastatin   20 mg Oral QHS   senna-docusate  1 tablet Oral QHS   Continuous Infusions: PRN Meds:.acetaminophen , alum & mag hydroxide-simeth, hydrALAZINE , labetalol , meclizine , ondansetron  **OR** ondansetron  (ZOFRAN ) IV, mouth rinse, phenol, promethazine     Subjective:   [name] was seen and examined today. NO NEW COMPLAINTS.   Objective:   Vitals:   04/18/24 2303 04/19/24 0300 04/19/24 0727 04/19/24 1212  BP: 124/71 125/66 115/60 112/71  Pulse: 68 70 67 67  Resp: 18 20 18 16   Temp: 99 F (37.2 C) 98.7 F (37.1 C) 98.5 F (36.9 C) 98.6 F (37 C)  TempSrc: Oral Oral Oral Oral  SpO2: 95% 92% 93% 95%  Weight:      Height:        Intake/Output Summary (Last 24 hours) at 04/19/2024 1435 Last data filed at 04/19/2024 0728 Gross per 24 hour  Intake 200 ml  Output 450 ml  Net -250 ml   Filed Weights   04/02/24 0213 04/13/24 0640  Weight: 102.2 kg 102.2 kg     Exam General exam: Appears calm and comfortable  Respiratory system: Clear to auscultation. Respiratory effort normal. Cardiovascular system: S1 & S2 heard, RRR. Gastrointestinal system:  Abdomen is nondistended, soft and nontender.  Central nervous system: Alert and oriented.  Extremities: no edema.  Skin: No rashes,  Psychiatry: Mood & affect appropriate.     Data Reviewed:  I have personally reviewed following labs and imaging studies   CBC Lab Results  Component Value Date   WBC 7.0 04/18/2024   RBC 4.66 04/18/2024   HGB 13.0 04/18/2024   HCT 38.4 (L) 04/18/2024   MCV 82.4 04/18/2024   MCH 27.9 04/18/2024   PLT 277 04/18/2024   MCHC 33.9 04/18/2024   RDW 12.5 04/18/2024   LYMPHSABS 2.0 04/18/2024   MONOABS 0.6 04/18/2024   EOSABS 0.1 04/18/2024   BASOSABS 0.1 04/18/2024     Last metabolic panel Lab Results  Component Value Date   NA 138 04/18/2024   K 3.6 04/18/2024   CL 104 04/18/2024   CO2 25 04/18/2024   BUN 16 04/18/2024   CREATININE 0.95 04/18/2024   GLUCOSE 98 04/18/2024   GFRNONAA >60 04/18/2024   GFRAA 69 05/25/2020   CALCIUM  8.9 04/18/2024   PHOS 3.4 04/03/2024   PROT 7.6 04/01/2024   ALBUMIN  4.1 04/01/2024   LABGLOB 2.9 05/25/2020   AGRATIO 1.5 05/25/2020   BILITOT 1.3 (H) 04/01/2024   ALKPHOS 58 04/01/2024   AST 20 04/01/2024   ALT 20 04/01/2024   ANIONGAP 9 04/18/2024    CBG (last 3)  Recent Labs    04/18/24 2128 04/19/24 0725 04/19/24 1211  GLUCAP 86 107* 115*      Coagulation Profile: No results for input(s): INR, PROTIME in the last 168 hours.   Radiology Studies: DG Ankle Complete Right Result Date: 04/18/2024 EXAM: 3 OR MORE VIEW(S) XRAY OF THE ANKLE 04/18/2024 04:45:00 PM CLINICAL HISTORY: Ankle pain COMPARISON: None available. FINDINGS: BONES AND JOINTS: No acute fracture. No malalignment. SOFT TISSUES: Ossific density along the course of the Achilles tendon. IMPRESSION: 1. Mild calcific tendinitis of the achilles tendon. Electronically signed by: Dorethia Molt MD MD 04/18/2024 08:11 PM EST RP Workstation: [id number]       [name] M.D. [facility] 04/19/2024, 2:35 PM  Available via  Epic secure chat 7am-7pm After  7 pm, please refer to night coverage provider listed on amion.    "

## 2024-04-20 ENCOUNTER — Encounter (HOSPITAL_COMMUNITY): Payer: Self-pay | Admitting: Physical Medicine and Rehabilitation

## 2024-04-20 ENCOUNTER — Inpatient Hospital Stay (HOSPITAL_COMMUNITY)
Admission: AD | Admit: 2024-04-20 | Discharge: 2024-05-15 | DRG: 056 | Disposition: A | Discharge status: Home / Self Care | Source: Intra-hospital | Attending: Physical Medicine and Rehabilitation | Admitting: Physical Medicine and Rehabilitation

## 2024-04-20 ENCOUNTER — Other Ambulatory Visit: Payer: Self-pay

## 2024-04-20 DIAGNOSIS — I10 Essential (primary) hypertension: Secondary | ICD-10-CM

## 2024-04-20 DIAGNOSIS — I613 Nontraumatic intracerebral hemorrhage in brain stem: Secondary | ICD-10-CM

## 2024-04-20 DIAGNOSIS — E1169 Type 2 diabetes mellitus with other specified complication: Secondary | ICD-10-CM

## 2024-04-20 DIAGNOSIS — D1800 Hemangioma unspecified site: Secondary | ICD-10-CM | POA: Diagnosis not present

## 2024-04-20 DIAGNOSIS — Z794 Long term (current) use of insulin: Secondary | ICD-10-CM

## 2024-04-20 DIAGNOSIS — E1159 Type 2 diabetes mellitus with other circulatory complications: Secondary | ICD-10-CM

## 2024-04-20 DIAGNOSIS — F54 Psychological and behavioral factors associated with disorders or diseases classified elsewhere: Secondary | ICD-10-CM

## 2024-04-20 LAB — CBC
HCT: 41.6 % (ref 39.0–52.0)
Hemoglobin: 13.8 g/dL (ref 13.0–17.0)
MCH: 27.7 pg (ref 26.0–34.0)
MCHC: 33.2 g/dL (ref 30.0–36.0)
MCV: 83.5 fL (ref 80.0–100.0)
Platelets: 301 K/uL (ref 150–400)
RBC: 4.98 MIL/uL (ref 4.22–5.81)
RDW: 12.8 % (ref 11.5–15.5)
WBC: 9.9 K/uL (ref 4.0–10.5)
nRBC: 0 % (ref 0.0–0.2)

## 2024-04-20 LAB — GLUCOSE, CAPILLARY
Glucose-Capillary: 125 mg/dL — ABNORMAL HIGH (ref 70–99)
Glucose-Capillary: 129 mg/dL — ABNORMAL HIGH (ref 70–99)
Glucose-Capillary: 91 mg/dL (ref 70–99)
Glucose-Capillary: 93 mg/dL (ref 70–99)

## 2024-04-20 LAB — CREATININE, SERUM
Creatinine, Ser: 1.12 mg/dL (ref 0.61–1.24)
GFR, Estimated: >60 mL/min

## 2024-04-20 MED ORDER — POLYVINYL ALCOHOL 1.4 % OP SOLN
1.0000 [drp] | OPHTHALMIC | Status: DC
  Administered 2024-04-21 – 2024-04-22 (×7): 1 [drp] via OPHTHALMIC
  Filled 2024-04-20: qty 15

## 2024-04-20 MED ORDER — METFORMIN HCL 500 MG PO TABS
500.0000 mg | ORAL_TABLET | Freq: Two times a day (BID) | ORAL | Status: DC
  Administered 2024-04-20 – 2024-05-15 (×49): 500 mg via ORAL
  Filled 2024-04-20 (×50): qty 1

## 2024-04-20 MED ORDER — DOCUSATE SODIUM 50 MG/5ML PO LIQD: 100.0000 mg | Freq: Two times a day (BID) | ORAL | Status: DC

## 2024-04-20 MED ORDER — HYDROCERIN EX CREA: TOPICAL_CREAM | CUTANEOUS | Status: DC | PRN

## 2024-04-20 MED ORDER — DOCUSATE SODIUM 50 MG/5ML PO LIQD
100.0000 mg | Freq: Two times a day (BID) | ORAL | Status: DC
  Administered 2024-04-21 – 2024-04-23 (×5): 100 mg via ORAL
  Filled 2024-04-20 (×8): qty 10

## 2024-04-20 MED ORDER — INSULIN ASPART 100 UNIT/ML IJ SOLN: INTRAMUSCULAR | Status: DC

## 2024-04-20 MED ORDER — INSULIN GLARGINE 100 UNIT/ML ~~LOC~~ SOLN
8.0000 [IU] | Freq: Every day | SUBCUTANEOUS | Status: DC
  Filled 2024-04-20: qty 0.08

## 2024-04-20 MED ORDER — POLYETHYLENE GLYCOL 3350 17 G PO PACK
17.0000 g | PACK | Freq: Every day | ORAL | Status: DC
  Administered 2024-04-21 – 2024-05-15 (×23): 17 g via ORAL
  Filled 2024-04-20 (×25): qty 1

## 2024-04-20 MED ORDER — AMLODIPINE BESYLATE 5 MG PO TABS
5.0000 mg | ORAL_TABLET | Freq: Every day | ORAL | Status: DC
  Administered 2024-04-21 – 2024-05-15 (×25): 5 mg via ORAL
  Filled 2024-04-20 (×25): qty 1

## 2024-04-20 MED ORDER — SENNOSIDES-DOCUSATE SODIUM 8.6-50 MG PO TABS: 1.0000 | ORAL_TABLET | Freq: Every day | ORAL | Status: DC

## 2024-04-20 MED ORDER — HEPARIN SODIUM (PORCINE) 5000 UNIT/ML IJ SOLN
5000.0000 [IU] | Freq: Two times a day (BID) | INTRAMUSCULAR | Status: DC
  Administered 2024-04-20 – 2024-05-12 (×44): 5000 [IU] via SUBCUTANEOUS
  Filled 2024-04-20 (×44): qty 1

## 2024-04-20 MED ORDER — ROSUVASTATIN CALCIUM 20 MG PO TABS
20.0000 mg | ORAL_TABLET | Freq: Every day | ORAL | Status: DC
  Administered 2024-04-20 – 2024-05-14 (×25): 20 mg via ORAL
  Filled 2024-04-20 (×25): qty 1

## 2024-04-20 MED ORDER — AMLODIPINE BESYLATE 5 MG PO TABS: 5.0000 mg | ORAL_TABLET | Freq: Every day | ORAL | Status: DC

## 2024-04-20 MED ORDER — INSULIN ASPART 100 UNIT/ML IJ SOLN
0.0000 [IU] | Freq: Three times a day (TID) | INTRAMUSCULAR | Status: DC
  Administered 2024-04-22 – 2024-04-26 (×3): 1 [IU] via SUBCUTANEOUS
  Administered 2024-04-27: 2 [IU] via SUBCUTANEOUS
  Administered 2024-04-28 – 2024-04-30 (×4): 1 [IU] via SUBCUTANEOUS
  Administered 2024-04-30: 2 [IU] via SUBCUTANEOUS
  Administered 2024-04-30 – 2024-05-02 (×4): 1 [IU] via SUBCUTANEOUS
  Filled 2024-04-20 (×5): qty 1
  Filled 2024-04-20: qty 2
  Filled 2024-04-20: qty 1
  Filled 2024-04-20: qty 2
  Filled 2024-04-20 (×3): qty 1

## 2024-04-20 MED ORDER — CALCIUM CARBONATE ANTACID 500 MG PO CHEW
400.0000 mg | CHEWABLE_TABLET | Freq: Two times a day (BID) | ORAL | Status: AC
  Administered 2024-04-20: 400 mg via ORAL
  Filled 2024-04-20: qty 2

## 2024-04-20 MED ORDER — HEPARIN SODIUM (PORCINE) 5000 UNIT/ML IJ SOLN: 5000.0000 [IU] | Freq: Two times a day (BID) | INTRAMUSCULAR | Status: DC

## 2024-04-20 MED ORDER — ENSURE PLUS HIGH PROTEIN PO LIQD: 237.0000 mL | Freq: Two times a day (BID) | ORAL | Status: DC

## 2024-04-20 MED ORDER — SENNOSIDES-DOCUSATE SODIUM 8.6-50 MG PO TABS
1.0000 | ORAL_TABLET | Freq: Every day | ORAL | Status: DC
  Administered 2024-04-20 – 2024-05-06 (×15): 1 via ORAL
  Filled 2024-04-20 (×17): qty 1

## 2024-04-20 MED ORDER — CETAPHIL MOISTURIZING EX LOTN
TOPICAL_LOTION | CUTANEOUS | Status: DC | PRN
  Filled 2024-04-20: qty 473

## 2024-04-20 MED ORDER — ENSURE PLUS HIGH PROTEIN PO LIQD
237.0000 mL | Freq: Two times a day (BID) | ORAL | Status: DC
  Administered 2024-04-21 – 2024-05-14 (×33): 237 mL via ORAL

## 2024-04-20 MED ORDER — POLYETHYLENE GLYCOL 3350 17 G PO PACK: 17.0000 g | PACK | Freq: Every day | ORAL | 0 refills | Status: AC

## 2024-04-20 MED ORDER — PANTOPRAZOLE SODIUM 40 MG PO TBEC
40.0000 mg | DELAYED_RELEASE_TABLET | Freq: Every day | ORAL | Status: DC
  Administered 2024-04-20 – 2024-05-14 (×25): 40 mg via ORAL
  Filled 2024-04-20 (×25): qty 1

## 2024-04-20 MED ORDER — ACETAMINOPHEN 325 MG PO TABS
650.0000 mg | ORAL_TABLET | Freq: Four times a day (QID) | ORAL | Status: DC | PRN
  Administered 2024-04-20 – 2024-05-08 (×14): 650 mg via ORAL
  Filled 2024-04-20 (×14): qty 2

## 2024-04-20 MED ORDER — ONDANSETRON HCL 4 MG PO TABS: 4.0000 mg | ORAL_TABLET | ORAL | Status: DC | PRN

## 2024-04-20 MED ORDER — ATENOLOL 50 MG PO TABS
50.0000 mg | ORAL_TABLET | Freq: Every day | ORAL | Status: DC
  Administered 2024-04-21 – 2024-05-15 (×25): 50 mg via ORAL
  Filled 2024-04-20 (×25): qty 1

## 2024-04-20 MED ORDER — ONDANSETRON HCL 4 MG/2ML IJ SOLN: 4.0000 mg | INTRAMUSCULAR | Status: DC | PRN

## 2024-04-20 MED ORDER — POLYETHYLENE GLYCOL 3350 17 G PO PACK: 17.0000 g | PACK | Freq: Every day | ORAL | Status: DC

## 2024-04-20 MED ORDER — ATENOLOL 50 MG PO TABS: 50.0000 mg | ORAL_TABLET | Freq: Every day | ORAL | Status: DC

## 2024-04-20 MED ORDER — MECLIZINE HCL 25 MG PO TABS: 25.0000 mg | ORAL_TABLET | Freq: Three times a day (TID) | ORAL | Status: DC | PRN

## 2024-04-20 NOTE — Progress Notes (Addendum)
 I had a chance to come by and talk with the patient today.  Today he was alone and working with PT on the side of the bed.  The patient's clinical status appears unchanged.  He is able to lift the right upper extremity seemingly with ease now, though his full strength has yet to return.  He is also able to move his right leg against gravity without assistance, though once again still not at full strength.  He remains optimistic about his recovery.  His gaze palsy seems to be improving as well with the right eye now being able to cross midline to the left.  The left eye still remains unable to cross midline.  The patient speech remains slightly slurred.  The goal today is to continue to have the patient working with rehab and sitting up in the chair.  Currently awaiting transfer to rehab at this time.

## 2024-04-20 NOTE — Progress Notes (Signed)
 Signed      Expand All Collapse All          Physical Medicine and Rehabilitation Consult Reason for Consult: ICH Referring Physician: Sabas Brod, MD     HPI: Larry Dawson is a 69 y.o. 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 He is s/p craniotomy on 04/13/24. He has a PMH of admission on 12/8-12/10 with ICH, HTN, HLD, COPD< IBS, hernia repair, and T2DM. Physical Medicine & Rehabilitation was consulted to assess candidacy for CIR.  Patient is highly motivated as per therapy notes but is will increased weakness and visual neglect s/p craniotomy.        Home: Home Living Family/patient expects to be discharged to:: Private residence Living Arrangements: Spouse/significant other Available Help at Discharge: Family, Available 24 hours/day Type of Home: House Home Access: Stairs to enter [employer] of Steps: 4 Entrance Stairs-Rails: Right Home Layout: Two level, Bed/bath upstairs Alternate Level Stairs-Number of Steps: flight Alternate Level Stairs-Rails: Right Bathroom Shower/Tub: Health Visitor: Handicapped height Home Equipment: None Additional Comments: drives a tour bus  Functional History: Prior Function Prior Level of Function : Independent/Modified Independent, Working/employed, Driving Mobility Comments: indep, no AD, worked driving tour buses ADLs Comments: indep, working drives a tour bus Functional Status:  Mobility: Bed Mobility Overal bed mobility: Needs Assistance Bed Mobility: Supine to Sit Rolling: Mod assist Sidelying to sit: Mod assist Supine to sit: Mod assist General bed mobility comments: OOB in recliner Transfers Overall transfer level: Needs assistance Equipment used: Rolling walker (2 wheels) Transfers: Sit to/from Stand Sit to Stand: Mod assist, +2 physical assistance Bed to/from chair/wheelchair/BSC transfer type:: Stand pivot Stand pivot  transfers: Max assist, +2 physical assistance General transfer comment: OOB in recliner, OT Eval from sitting Ambulation/Gait Ambulation/Gait assistance: +2 safety/equipment, Max assist Gait Distance (Feet): 10 Feet Assistive device: Rolling walker (2 wheels), 1 person hand held assist Gait Pattern/deviations: Step-to pattern, Decreased stance time - right, Decreased step length - left, Decreased dorsiflexion - right, Ataxic, Decreased step length - right, Narrow base of support, Trunk flexed General Gait Details: unable this date Gait velocity: dec Gait velocity interpretation: <1.31 ft/sec, indicative of household ambulator   ADL: ADL Overall ADL's : Needs assistance/impaired Eating/Feeding: Minimal assistance, Sitting Grooming: Minimal assistance, Sitting Grooming Details (indicate cue type and reason): good seated balance, extra time and effort Upper Body Bathing: Moderate assistance Lower Body Bathing: Moderate assistance Upper Body Dressing : Moderate assistance, Sitting Upper Body Dressing Details (indicate cue type and reason): and extra time Lower Body Dressing: Total assistance, Sitting/lateral leans Toilet Transfer: Maximal assistance, +2 for safety/equipment, Stand-pivot, Rolling walker (2 wheels) Toilet Transfer Details (indicate cue type and reason): deferred Functional mobility during ADLs: Moderate assistance, Rolling walker (2 wheels), Maximal assistance, Cueing for safety, Cueing for sequencing General ADL Comments: decreased sensation and motor control of RUE, visual deficits decreased balance, strength.   Cognition: Cognition Orientation Level: Oriented X4 Cognition Arousal: Alert Behavior During Therapy: Flat affect     ROS +right sided weakness and hearing loss     Past Medical History:  Diagnosis Date   Allergy     Anal fissure     COPD (chronic obstructive pulmonary disease) (HCC)      Albuterol  PRN.   Diabetes mellitus without complication (HCC)      Hydrocele     Hyperlipidemia     Hypertension     IBS (irritable bowel syndrome) 05/22/2013  per chart note-pt denied             Past Surgical History:  Procedure Laterality Date   APPLICATION OF CRANIAL NAVIGATION   04/13/2024    Procedure: COMPUTER-ASSISTED NAVIGATION, FOR CRANIAL PROCEDURE;  Surgeon: [name] Dino HERO, MD;  Location: [facility] OR;  Service: Neurosurgery;;   CRANIOTOMY Left 04/13/2024    Procedure: Left Posterior Fossa Craniotomy for ARTERIO-VENOUS MALFORMATION DURAL COMPLEX;  Surgeon: [name] Dino HERO, MD;  Location: [facility] OR;  Service: Neurosurgery;  Laterality: Left;   HERNIA REPAIR   prior to 2013   HYDROCELE EXCISION Bilateral 11/23/2013    Procedure: HYDROCELECTOMY ADULT;  Surgeon: Alm GORMAN Fragmin, MD;  Location: [facility];  Service: Urology;  Laterality: Bilateral;   VASECTOMY   prior to 2013             Family History  Problem Relation Age of Onset   Diabetes Father     Hypertension Father     Diabetes Brother     Hypertension Brother     Diabetes Brother     Diabetes Brother          Social History:  reports that he has quit smoking. His smoking use included cigarettes. He has never used smokeless tobacco. He reports that he does not currently use alcohol . He reports that he does not use drugs. Allergies: [Allergies]  [Allergies]      Allergen Reactions   Ace Inhibitors Swelling      Suspected angioedema   Penicillins Anaphylaxis, Hives, Rash and Other (See Comments)      Throat swells   Latex Hives, Swelling and Rash         Medications Prior to Admission  Medication Sig Dispense Refill   Arginine 500 MG CAPS Take 1 capsule by mouth daily.       Ascorbic Acid (VITAMIN C PO) Take 1 tablet by mouth daily.       atenolol -chlorthalidone  (TENORETIC ) 50-25 MG tablet Take 1 tablet by mouth daily.       cetirizine  (ZYRTEC ) 10 MG tablet Take 1 tablet (10 mg total) by mouth daily. (Patient taking differently: Take 10 mg by mouth daily as  needed for allergies.) 30 tablet 0   cholecalciferol (VITAMIN D) 1000 units tablet Take 1,000 Units by mouth daily.       glipiZIDE  (GLUCOTROL ) 5 MG tablet TAKE 1 TABLET BY MOUTH 2 TIMES A DAY BEFORE A MEAL 60 tablet 3   metFORMIN  (GLUCOPHAGE ) 500 MG tablet TAKE 1 TABLET BY MOUTH TWICE A DAY WITH A MEAL 180 tablet 3   Multiple Vitamins-Minerals (ZINC  PO) Take 1 tablet by mouth daily.       Omega-3 Fatty Acids (FISH OIL) 1000 MG CAPS Take 1 capsule by mouth daily.       omeprazole  (PRILOSEC) 20 MG capsule Take 20 mg by mouth daily.       sildenafil  (VIAGRA ) 100 MG tablet Take 0.5-1 tablets (50-100 mg total) by mouth daily as needed for erectile dysfunction. 5 tablet 11   tirzepatide  (MOUNJARO ) 5 MG/0.5ML Pen Inject 5 mg into the skin once a week. 6 mL 3   rosuvastatin  (CRESTOR ) 20 MG tablet Take 20 mg by mouth at bedtime. (Patient not taking: Reported on 04/01/2024)                Blood pressure (!) 154/81, pulse 82, temperature 98 F (36.7 C), temperature source Oral, resp. rate 17, height 5' 7 (1.702 m), weight 102.2 kg, SpO2  98%. Physical Exam Gen: no distress, normal appearing HEENT: oral mucosa pink and moist, NCAT Cardio: Reg rate Chest: normal effort, normal rate of breathing Abd: soft, non-distended Ext: no edema Psych: flat affect Skin: intact Neuro: Alert, impaired problem solving and awareness, +left sided visual neglect, delayed processing but can follow one step commands, 2/5 strength RLE, 3/5 strength RUE   Lab Results Last 24 Hours       Results for orders placed or performed during the hospital encounter of 04/01/24 (from the past 24 hours)  Glucose, capillary     Status: Abnormal    Collection Time: 04/14/24 11:17 AM  Result Value Ref Range    Glucose-Capillary 211 (H) 70 - 99 mg/dL  Glucose, capillary     Status: Abnormal    Collection Time: 04/14/24  4:56 PM  Result Value Ref Range    Glucose-Capillary 162 (H) 70 - 99 mg/dL  Glucose, capillary     Status:  Abnormal    Collection Time: 04/14/24  9:19 PM  Result Value Ref Range    Glucose-Capillary 174 (H) 70 - 99 mg/dL  Basic metabolic panel with GFR     Status: Abnormal    Collection Time: 04/15/24  5:46 AM  Result Value Ref Range    Sodium 141 135 - 145 mmol/L    Potassium 4.1 3.5 - 5.1 mmol/L    Chloride 109 98 - 111 mmol/L    CO2 22 22 - 32 mmol/L    Glucose, Bld 122 (H) 70 - 99 mg/dL    BUN 18 8 - 23 mg/dL    Creatinine, Ser 9.00 0.61 - 1.24 mg/dL    Calcium  8.7 (L) 8.9 - 10.3 mg/dL    GFR, Estimated >39 >39 mL/min    Anion gap 10 5 - 15  CBC with Differential/Platelet     Status: Abnormal    Collection Time: 04/15/24  5:46 AM  Result Value Ref Range    WBC 15.1 (H) 4.0 - 10.5 K/uL    RBC 4.57 4.22 - 5.81 MIL/uL    Hemoglobin 12.7 (L) 13.0 - 17.0 g/dL    HCT 61.9 (L) 60.9 - 52.0 %    MCV 83.2 80.0 - 100.0 fL    MCH 27.8 26.0 - 34.0 pg    MCHC 33.4 30.0 - 36.0 g/dL    RDW 87.3 88.4 - 84.4 %    Platelets 256 150 - 400 K/uL    nRBC 0.0 0.0 - 0.2 %    Neutrophils Relative % 79 %    Neutro Abs 12.1 (H) 1.7 - 7.7 K/uL    Lymphocytes Relative 13 %    Lymphs Abs 1.9 0.7 - 4.0 K/uL    Monocytes Relative 7 %    Monocytes Absolute 1.1 (H) 0.1 - 1.0 K/uL    Eosinophils Relative 0 %    Eosinophils Absolute 0.0 0.0 - 0.5 K/uL    Basophils Relative 0 %    Basophils Absolute 0.0 0.0 - 0.1 K/uL    Immature Granulocytes 1 %    Abs Immature Granulocytes 0.08 (H) 0.00 - 0.07 K/uL  Glucose, capillary     Status: Abnormal    Collection Time: 04/15/24  8:04 AM  Result Value Ref Range    Glucose-Capillary 120 (H) 70 - 99 mg/dL      Imaging Results (Last 48 hours)  MR BRAIN WO CONTRAST Result Date: 04/14/2024 EXAM: MRI BRAIN WITHOUT CONTRAST 04/14/2024 04:01:43 AM TECHNIQUE: Multiplanar multisequence MRI of the head/brain was  performed without the administration of intravenous contrast. COMPARISON: MR Head Without Contrast 04/11/2024 and earlier. CLINICAL HISTORY: 69 year old male with  central pontine hemorrhage, recently postoperative from braintem hematoma vs cavernoma resection. FINDINGS: BRAIN AND VENTRICLES: Stable major intracranial vascular flow voids including dominant distal left vertebral artery which appears to be the primary supply to basilar. Postoperative changes in the left posterior fossa tracking to the left cerebellopontine angle and cystic resection there of previous relatively large and rounded nearly 3 cm pontine hemorrhage / hematoma (series 8 image 10). Residual blood products but no measurable hematoma now on T1 or T2 weighted imaging. Regional brainstem and left cerebellar peduncle T2 and FLAIR hyperintense edema. Pontine expansion has regressed. Basilar cisterns remain patent. Susceptibility weighted imaging also suggestive of left hemisphere and parasagittal postoperative pneumocephalus. Heterogeneous T2 and FLAIR hyperintensity in the deep gray nuclei including bilateral thalami is stable. No convincing diffusion restriction when allowing for susceptibility artifact on DWI. Patchy and confluent widely scattered cerebral white matter T2 and FLAIR hyperintensity is stable. Trace intraventricular hemorrhage layering in the occipital horns is new. No ventriculomegaly. The sella is unremarkable. No midline shift. ORBITS: Rightward gaze deviation now. SINUSES AND MASTOIDS: Stable sphenoid sinus disease. Mastoid air cells remain well aerated. BONES AND SOFT TISSUES: Evidence of suboccipital craniotomy now. Postoperative changes to the left scalp. Normal marrow signal. IMPRESSION: 1. No adverse features status post left suboccipital craniotomy and brainstem hematoma resection. Expected postoperative changes including probable pneumocephalus. 2. No new intracranial abnormality. 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 Electronically signed by: Helayne Hurst MD 04/14/2024 06:29 AM EST RP Workstation: [id number]    DG Abd Portable 1V Result Date:  04/13/2024 EXAM: 1 VIEW XRAY OF THE ABDOMEN 04/13/2024 04:22:48 PM COMPARISON: None available. CLINICAL HISTORY: Encounter for nasogastric (NG) tube placement FINDINGS: LINES, TUBES AND DEVICES: Nasogastric tube in place with tip overlying the distal esophagus. BOWEL: Nonobstructive bowel gas pattern. SOFT TISSUES: No abnormal calcifications. BONES: No acute fracture. IMPRESSION: 1. Nasogastric tube tip overlies the distal esophagus; recommend advancement into the stomach. Electronically signed by: Greig Pique MD 04/13/2024 05:19 PM EST RP Workstation: [id number]        Assessment/Plan: Diagnosis: Brainstem cavernoma s/p craniotomy Does the need for close, 24 hr/day medical supervision in concert with the patient's rehab needs make it unreasonable for this patient to be served in a less intensive setting? Yes Co-Morbidities requiring supervision/potential complications:  1) class 2 obesity: provide dietary education 2) COPD: monitor respiratory saturations 3) Type 2 diabetes: monitor CBGs 4) HLD 5) HTN: monitor blood pressures Due to bladder management, bowel management, safety, skin/wound care, disease management, medication administration, pain management, and patient education, does the patient require 24 hr/day rehab nursing? Yes Does the patient require coordinated care of a physician, rehab nurse, therapy disciplines of PT, OT, SLP to address physical and functional deficits in the context of the above medical diagnosis(es)? Yes Addressing deficits in the following areas: balance, endurance, locomotion, strength, transferring, bowel/bladder control, bathing, dressing, feeding, grooming, toileting, cognition, speech, and psychosocial support Can the patient actively participate in an intensive therapy program of at least 3 hrs of therapy per day at least 5 days per week? Yes The potential for patient to make measurable gains while on inpatient rehab is excellent Anticipated functional outcomes  upon discharge from inpatient rehab are supervision  with PT, supervision with OT, supervision with SLP. Estimated rehab length of stay to reach the above functional goals is: 14-16 days Anticipated discharge destination: Home Overall Rehab/Functional  Prognosis: excellent   POST ACUTE RECOMMENDATIONS: This patient's condition is appropriate for continued rehabilitative care in the following setting: CIR Patient has agreed to participate in recommended program. Yes Note that insurance prior authorization may be required for reimbursement for recommended care.     I have personally performed a face to face diagnostic evaluation of this patient. Additionally, I have examined the patient's medical record including any pertinent labs and radiographic images.     Thanks,   Sven SHAUNNA Elks, MD 04/15/2024

## 2024-04-20 NOTE — Discharge Instructions (Addendum)
 Inpatient Rehab Discharge Instructions  Larry Dawson Discharge date and time: No discharge date for patient encounter.   Activities/Precautions/ Functional Status: Activity: activity as tolerated Diet: diabetic diet Wound Care: Routine skin checks Functional status:  ___ No restrictions     ___ Walk up steps independently ___ 24/7 supervision/assistance   ___ Walk up steps with assistance ___ Intermittent supervision/assistance  ___ Bathe/dress independently ___ Walk with walker     _x__ Bathe/dress with assistance ___ Walk Independently    ___ Shower independently ___ Walk with assistance    ___ Shower with assistance ___ No alcohol      ___ Return to work/school ________  Special Instructions: No driving smoking or alcohol    COMMUNITY REFERRALS UPON DISCHARGE:    Outpatient: PT      OT     ST              Agency:[facility] Location Phone:[phone]             Appointment Date/Time:*Please expect follow-up within 7-10 business days to schedule your appointment. If you have not received follow-up, be sure to contact the site directly.*   Medical Equipment/Items Ordered: rolling walker                                                 Agency/Supplier:[facility] [phone]    My questions have been answered and I understand these instructions. I will adhere to these goals and the provided educational materials after my discharge from the hospital.  Patient/Caregiver Signature _______________________________ Date __________  Clinician Signature _______________________________________ Date __________  Please bring this form and your medication list with you to all your follow-up doctor's appointments.

## 2024-04-20 NOTE — Discharge Summary (Signed)
 " Physician Discharge Summary   Patient: Larry Dawson MRN: [id number] DOB: 1955-04-21  Admit date:     04/01/2024  Discharge date: 04/20/2024  Discharge Physician: [name]   PCP: [name], MD   Recommendations at discharge:  Please follow up with neurosurgery as recommended.  Please follow up with PCP in one week after discharge.   Discharge Diagnoses: Principal Problem:   Intracranial hemorrhage (HCC) Active Problems:   Hypertension associated with type 2 diabetes mellitus (HCC)   COPD (chronic obstructive pulmonary disease) (HCC)   Hypokalemia   Pontine hemorrhage (HCC)   Cavernous malformation   Cerebral cavernous malformation  Resolved Problems:   * No resolved hospital problems. [facility] Course: 69 y.o. 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  He is currently waiting for CIR placement.   Assessment and Plan:     Symptomatic brainstem covernoma  -S/P posterior fossa cranitomy and resection on 04/14/23 by Dr. [name] Patients weakness on the right , slowly improving.  Working with PT, recommending CIR.      GERD - Continue pantoprazole    Hypertension Optimal BP parameters    COPD Stable, no signs of acute exacerbation.  Does not appear to be on inhalers at home.   Type 2 diabetes Repeat hemoglobin A1c is 6.8% Stopped the insulin , continue with metformin  and SSI.  Restart home meds.    Consultants: neurosurgery  Procedures performed: craniotomy.   Disposition: Rehabilitation facility Diet recommendation:  Carb modified diet DISCHARGE MEDICATION: Allergies as of 04/20/2024       Reactions   Ace Inhibitors Swelling   Suspected angioedema   Penicillins Anaphylaxis, Hives, Rash, Other (See Comments)   Throat swells   Latex Hives, Swelling, Rash        Medication  List     STOP taking these medications    atenolol -chlorthalidone  50-25 MG tablet Commonly known as: TENORETIC    glipiZIDE  5 MG tablet Commonly known as: GLUCOTROL    sildenafil  100 MG tablet Commonly known as: Viagra        TAKE these medications    amLODipine  5 MG tablet Commonly known as: NORVASC  Take 1 tablet (5 mg total) by mouth daily. Start taking on: April 21, 2024   Arginine 500 MG Caps Take 1 capsule by mouth daily.   atenolol  50 MG tablet Commonly known as: TENORMIN  Take 1 tablet (50 mg total) by mouth daily. Start taking on: April 21, 2024   cetirizine  10 MG tablet Commonly known as: ZYRTEC  Take 1 tablet (10 mg total) by mouth daily. What changed:  when to take this reasons to take this   cholecalciferol 1000 units tablet Commonly known as: VITAMIN D Take 1,000 Units by mouth daily.   docusate 50 MG/5ML liquid Commonly known as: COLACE Take 10 mLs (100 mg total) by mouth 2 (two) times daily.   feeding supplement Liqd Take 237 mLs by mouth 2 (two) times daily between meals.   Fish Oil 1000 MG Caps Take 1 capsule by mouth daily.   insulin  aspart 100 UNIT/ML injection Commonly known as: novoLOG  CBG 70 - 120: 0 units  CBG 121 - 150: 1 unit  CBG 151 - 200: 2 units  CBG 201 - 250: 3 units  CBG 251 - 300: 5 units  CBG 301 - 350: 7 units  CBG 351 - 400: 9 units  metFORMIN  500 MG tablet Commonly known as: GLUCOPHAGE  TAKE 1 TABLET BY MOUTH TWICE A DAY WITH A MEAL   omeprazole  20 MG capsule Commonly known as: PRILOSEC Take 20 mg by mouth daily.   polyethylene glycol 17 g packet Commonly known as: MIRALAX  / GLYCOLAX  Take 17 g by mouth daily. Start taking on: April 21, 2024   rosuvastatin  20 MG tablet Commonly known as: CRESTOR  Take 20 mg by mouth at bedtime.   senna-docusate 8.6-50 MG tablet Commonly known as: Senokot-S Take 1 tablet by mouth at bedtime.   tirzepatide  5 MG/0.5ML Pen Commonly known as: MOUNJARO  Inject 5 mg into  the skin once a week.   VITAMIN C PO Take 1 tablet by mouth daily.   ZINC  PO Take 1 tablet by mouth daily.        Discharge Exam: Filed Weights   04/02/24 0213 04/13/24 0640  Weight: 102.2 kg 102.2 kg   General exam: Appears calm and comfortable  Respiratory system: Clear to auscultation. Respiratory effort normal. Cardiovascular system: S1 & S2 heard, RRR.  Gastrointestinal system: Abdomen is nondistended, soft and nontender.  Central nervous system: Alert and oriented. Right sided weakness improving with PT Extremities: Symmetric 5 x 5 power. Skin: No rashes, lesions or ulcers Psychiatry: Judgement and insight appear normal. Mood & affect appropriate.    Condition at discharge: fair  The results of significant diagnostics from this hospitalization (including imaging, microbiology, ancillary and laboratory) are listed below for reference.   Imaging Studies: DG Ankle Complete Right Result Date: 04/18/2024 EXAM: 3 OR MORE VIEW(S) XRAY OF THE ANKLE 04/18/2024 04:45:00 PM CLINICAL HISTORY: Ankle pain COMPARISON: None available. FINDINGS: BONES AND JOINTS: No acute fracture. No malalignment. SOFT TISSUES: Ossific density along the course of the Achilles tendon. IMPRESSION: 1. Mild calcific tendinitis of the achilles tendon. Electronically signed by: Dorethia Molt MD MD 04/18/2024 08:11 PM EST RP Workstation: [id number]   DG Swallowing Func-Speech Pathology Result Date: 04/16/2024 Table formatting from the original result was not included.  04/14/24 1000 SLP Visit Information SLP Received On 04/14/24 General Information HPI Mr. [name] has known history of pontine lesion concerning for cavernoma.  He has now undergone posterior craniotomy with tumor resection on 1/5 and SLP asked to reassess swallow via MBS.  Previously evaluated with MBS prior to surgery to ensure safety with swallowing. Pt demonstrated delayed swallow intiaiton, but no aspiration, recommended to consume  regular/thin and has tolerated well. Caregiver present No Diet Prior to this Study NPO Temperature  Normal Respiratory Status WFL History of Recent Intubation Yes Behavior/Cognition Alert;Cooperative;Pleasant mood Self-Feeding Abilities Needs assist with self-feeding;Needs set-up for self-feeding Baseline vocal quality/speech Normal Volitional Cough Able to elicit Volitional Swallow (NT) Anatomy WFL Boluses Administered Boluses Administered Thin liquids (Level 0);Mildly thick liquids (Level 2, nectar thick);Moderately thick liquids (Level 3, honey thick);Puree;Solid Oral Impairment Domain Lip Closure Escape beyond mid-chin Tongue control during bolus hold Posterior escape of less than half of bolus Bolus preparation/mastication Timely and efficient chewing and mashing Bolus transport/lingual motion Brisk tongue motion Oral residue Trace residue lining oral structures Location of oral residue  Tongue Initiation of pharyngeal swallow  Pyriform sinuses Pharyngeal Impairment Domain Soft palate elevation No bolus between soft palate (SP)/pharyngeal wall (PW) Laryngeal elevation Complete superior movement of thyroid  cartilage with complete approximation of arytenoids to epiglottic petiole Anterior hyoid excursion Complete anterior movement Epiglottic movement Complete inversion Laryngeal vestibule closure Complete, no air/contrast in laryngeal vestibule Pharyngeal stripping wave  Present - complete Tongue base retraction No  contrast between tongue base and posterior pharyngeal wall (PPW) Pharyngeal residue Complete pharyngeal clearance (Has spillage of oral residue to pharynx post swallow) Penetration/Aspiration Scale Score 1.  Material does not enter airway Thin liquids (Level 0);Mildly thick liquids (Level 2, nectar thick);Puree;Solid 3.  Material enters airway, remains ABOVE vocal cords and not ejected out Thin liquids (Level 0) 8.  Material enters airway, passes BELOW cords without attempt by patient to eject out  (silent aspiration)  Thin liquids (Level 0) Clinical Impression Clinical Impression Pt demonstrates slightly worsened swallow function since last MBS, though impairments are mild and Pt continues to be able to eat and drink foods of choice with minimal aspiration risk if using precautions. 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 As seen previously there is a consistent mild delay in swallowing initiation and today, with large consecutive sips pt had trace penetration and aspiration without sensation. Suspect laryngeal sensation also impacted. Taking small single sips, clearing throat occasionally and doing a dry swallow to clear any oral and pharyngeal residue are all helpful. Pt is recommended to consume a regular diet and thin liquids, however he reports that he has only been eating applesauce and jello in the days leading up to surgery because he felt his swallowing was off. SLP emphasized that he might have some numbness, but his strength is quite good and good nutrition is important at this time. Encouraged pt to try a mechanical soft diet and thin liquids. SLP will f/u to reinforce results and strategies. SLP Visit Diagnosis Dysphagia, oropharyngeal phase (R13.12) Exam Limitations No limitations Swallowing Evaluation Recommendations Liquid Administration via Cup;Straw (but no consecutive sips) Medication Administration Whole meds with puree Supervision Set-up assistance for safety;Staff to assist with self-feeding;Other (comment) (mightneed help due to vision) Swallowing strategies   Slow rate;Small bites/sips;Multiple dry swallows after each bite/sip;Clear throat intermittently Postural changes Position pt fully upright for meals Oral care recommendations Oral care BID (2x/day) Treatment Plan Treatment recommendations Therapy as outlined in treatment  plan below Follow-up recommendations Acute inpatient rehab (3 hours/day) Functional status assessment Patient has had a recent decline in their functional status and demonstrates the ability to make significant improvements in function in a reasonable and predictable amount of time. Treatment frequency Min 2x/week Treatment duration 2 weeks Interventions Aspiration precaution training;Compensatory techniques;Patient/family education Goal Planning Prognosis for improved oropharyngeal function Good SLP Time Calculation SLP Start Time (ACUTE ONLY) 0935 SLP Stop Time (ACUTE ONLY) 0955 SLP Time Calculation (min) (ACUTE ONLY) 20 min SLP Evaluations $ SLP Speech Visit 1 Visit SLP Evaluations $MBS Swallow 1 Procedure   MR BRAIN WO CONTRAST Result Date: 04/14/2024 EXAM: MRI BRAIN WITHOUT CONTRAST 04/14/2024 04:01:43 AM TECHNIQUE: Multiplanar multisequence MRI of the head/brain was performed without the administration of intravenous contrast. COMPARISON: MR Head Without Contrast 04/11/2024 and earlier. CLINICAL HISTORY: 69 year old male with central pontine hemorrhage, recently postoperative from braintem hematoma vs cavernoma resection. FINDINGS: BRAIN AND VENTRICLES: Stable major intracranial vascular flow voids including dominant distal left vertebral artery which appears to be the primary supply to basilar. Postoperative changes in the left posterior fossa tracking to the left cerebellopontine angle and cystic resection there of previous relatively large and rounded nearly 3 cm pontine hemorrhage / hematoma (series 8 image 10). Residual blood products but no measurable hematoma now on T1 or T2 weighted imaging. Regional brainstem and left cerebellar  peduncle T2 and FLAIR hyperintense edema. Pontine expansion has regressed. Basilar cisterns remain patent. Susceptibility weighted imaging also suggestive of left hemisphere and parasagittal postoperative pneumocephalus. Heterogeneous T2 and FLAIR hyperintensity in the deep  gray nuclei including bilateral thalami is stable. No convincing diffusion restriction when allowing for susceptibility artifact on DWI. Patchy and confluent widely scattered cerebral white matter T2 and FLAIR hyperintensity is stable. Trace intraventricular hemorrhage layering in the occipital horns is new. No ventriculomegaly. The sella is unremarkable. No midline shift. ORBITS: Rightward gaze deviation now. SINUSES AND MASTOIDS: Stable sphenoid sinus disease. Mastoid air cells remain well aerated. BONES AND SOFT TISSUES: Evidence of suboccipital craniotomy now. Postoperative changes to the left scalp. Normal marrow signal. IMPRESSION: 1. No adverse features status post left suboccipital craniotomy and brainstem hematoma resection. Expected postoperative changes including probable pneumocephalus. 2. No new intracranial abnormality. 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 Electronically signed by: Helayne Hurst MD 04/14/2024 06:29 AM EST RP Workstation: [id number]   DG Abd Portable 1V Result Date: 04/13/2024 EXAM: 1 VIEW XRAY OF THE ABDOMEN 04/13/2024 04:22:48 PM COMPARISON: None available. CLINICAL HISTORY: Encounter for nasogastric (NG) tube placement FINDINGS: LINES, TUBES AND DEVICES: Nasogastric tube in place with tip overlying the distal esophagus. BOWEL: Nonobstructive bowel gas pattern. SOFT TISSUES: No abnormal calcifications. BONES: No acute fracture. IMPRESSION: 1. Nasogastric tube tip overlies the distal esophagus; recommend advancement into the stomach. Electronically signed by: Greig Pique MD 04/13/2024 05:19 PM EST RP Workstation: [id number]   MR BRAIN WO CONTRAST Result Date: 04/11/2024 EXAM: MRI BRAIN WITHOUT IV CONTRAST 04/11/2024 11:42:57 AM TECHNIQUE: Multiplanar multisequence MRI of the head/brain was performed without the administration of intravenous contrast. COMPARISON: MRI Head Without Then With IV Contrast 04/01/2024 and 03/16/2024. CLINICAL HISTORY: Stroke, follow  up. FINDINGS: BRAIN AND VENTRICLES: The central and left paracentral pontine hemorrhage has again slightly increased in size, now measuring 2.9 x 2.6 x 2.3 cm (previously measured 2.7 x 2.3 x 2.1 cm). Surrounding vasogenic edema extends into the left cerebellar peduncle, slightly increased from the prior study. No additional hemorrhages are present. Periventricular and subcortical T2 hyperintensities bilaterally are again noted, moderately advanced for age. Remote lacunar infarcts are present in the thalami bilaterally. No acute infarct. No mass. No midline shift. No hydrocephalus. The sella is unremarkable. Normal flow voids. ORBITS: No acute abnormality. SINUSES AND MASTOIDS: The right sphenoid sinus is near completely opacified. BONES AND SOFT TISSUES: Normal marrow signal. No acute soft tissue abnormality. IMPRESSION: 1. Slight interval increase in size of the central and left paracentral pontine hemorrhage, now measuring 2.9 x 2.6 x 2.3 cm, with slightly increased surrounding vasogenic edema extending into the left cerebellar peduncle. No additional hemorrhage is identified. 2. Moderately advanced periventricular and subcortical T2 hyperintensities bilaterally, with remote lacunar infarcts in the thalami bilaterally. This most likely reflects the sequelae of chronic microvascular ischemia. 3. Near complete opacification of the right sphenoid sinus. Electronically signed by: Lonni Necessary MD 04/11/2024 12:29 PM EST RP Workstation: [id number]   CT HEAD WO CONTRAST ( ) Result Date: 04/07/2024 EXAM: CT HEAD WITHOUT CONTRAST 04/07/2024 03:48:57 PM TECHNIQUE: CT of the head was performed without the administration of intravenous contrast. Automated exposure control, iterative reconstruction, and/or weight based adjustment of the mA/kV was utilized to reduce the radiation dose to as low as reasonably achievable. COMPARISON: Head CT and MRI 04/02/2024. CLINICAL HISTORY: Stroke, follow up; Neuro/ Motor  function change. FINDINGS: BRAIN AND VENTRICLES: The heterogeneously hyperdense/hemorrhagic mass in the pons has mildly enlarged further, now measuring  2.8 x 2.5 cm. Mild surrounding edema is stable to mildly increased. The pons remains expanded, however there is no other significant posterior fossa mass effect. No acute supratentorial hemorrhage, acute infarct, midline shift, hydrocephalus, or extra-axial fluid collection is identified. Patchy hypodensities in the cerebral white matter bilaterally are unchanged and nonspecific but compatible with moderately age advanced chronic small vessel ischemic disease. There is mild cerebral atrophy. Calcified atherosclerosis at the skull base. ORBITS: No acute abnormality. SINUSES: Progressive right sphenoid sinus opacification, likely by a combination of mucosal thickening and fluid. Clear mastoid air cells. SOFT TISSUES AND SKULL: No acute soft tissue abnormality. No skull fracture. IMPRESSION: 1. Mild interval enlargement of the hemorrhagic pontine mass characterized as a likely cavernoma on MRI. 2. No acute supratentorial abnormality. Electronically signed by: Dasie Hamburg MD 04/07/2024 04:19 PM EST RP Workstation: [id number]   DG Swallowing Func-Speech Pathology Result Date: 04/03/2024 Table formatting from the original result was not included. Modified Barium Swallow Study Patient Details Name: SANJUAN SAWA MRN: [id number] Date of Birth: 09/19/1955 Today's Date: 04/03/2024 HPI/PMH: Mr. Cedrick Partain admitted with  worsening gait. 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     He was admitted approximately 2 weeks ago for this, presented back to the emergency department with worsening right upper and lower extremity weakness, double vision, slurring of his speech, and difficulty swallowing.  MRI shows Interval enlargement of the pontine hemorrhage, which now demonstrates an  appearance most consistent with a large hemorrhagic pontine cavernous   malformation. There is a potential plan for craniotomy in the future.   Clinical Impression Pt demonstrates mild orophaygneal dysphagia with finding of delayed swallow intiation across textures. Pt has good oral containment, but consistently has bolus pool in pyriforms prior to swallow initiation. No penetration or aspiration occurred as a result during study, but suspect pt may have occasional coughing due to timing impairment. Esophageal sweep unrevealing.  Currently, risk of significant, detrimental aspiration is low, though pt has potential for a change in function with surgery. Advised pt to be aware of function and changes with biofeedback and education about swallowing. Recommended reflux precautions - sleeping on a wedge at 30 degress given report of regurgitation. Pt may resume regualr diet and thin liquids. No need for SLP at this time but please reorder as needed. Factors that may increase risk of adverse event in presence of aspiration Noe & Lianne 2021): No data recorded Recommendations/Plan: Swallowing Evaluation Recommendations Swallowing Evaluation Recommendations Recommendations: PO diet PO Diet Recommendation: Regular; Thin liquids (Level 0) Liquid Administration via: Cup; Straw Medication Administration: Whole meds with liquid Supervision: Patient able to self-feed Swallowing strategies  : Slow rate; Small bites/sips Postural changes: Position pt fully upright for meals; Stay upright 30-60 min after meals Oral care recommendations: Pt independent with oral care Treatment Plan Treatment Plan Treatment recommendations: No treatment recommended at this time Follow-up recommendations: No SLP follow up Recommendations Recommendations for follow up therapy are one component of a multi-disciplinary discharge planning process, led by the attending physician.  Recommendations may be updated based on patient status, additional functional criteria and insurance authorization. Assessment: Orofacial Exam:  Orofacial Exam Oral Cavity - Dentition: Adequate natural dentition Anatomy: Anatomy: WFL Boluses Administered: Boluses Administered Boluses Administered: Thin liquids (Level 0); Mildly thick liquids (Level 2, nectar thick); Moderately thick liquids (Level 3, honey thick); Puree; Solid  Oral Impairment Domain: Oral Impairment Domain Lip Closure: Interlabial escape, no progression to anterior lip Tongue control during bolus hold: Cohesive  bolus between tongue to palatal seal Bolus preparation/mastication: Timely and efficient chewing and mashing Bolus transport/lingual motion: Brisk tongue motion Oral residue: Trace residue lining oral structures Location of oral residue : Tongue Initiation of pharyngeal swallow : Pyriform sinuses  Pharyngeal Impairment Domain: Pharyngeal Impairment Domain Soft palate elevation: No bolus between soft palate (SP)/pharyngeal wall (PW) Laryngeal elevation: Complete superior movement of thyroid  cartilage with complete approximation of arytenoids to epiglottic petiole Anterior hyoid excursion: Complete anterior movement Epiglottic movement: Complete inversion Laryngeal vestibule closure: Complete, no air/contrast in laryngeal vestibule Pharyngeal stripping wave : Present - complete Pharyngoesophageal segment opening: Complete distension and complete duration, no obstruction of flow Tongue base retraction: No contrast between tongue base and posterior pharyngeal wall (PPW) Pharyngeal residue: Trace residue within or on pharyngeal structures Location of pharyngeal residue: Valleculae; Pyriform sinuses  Esophageal Impairment Domain: Esophageal Impairment Domain Esophageal clearance upright position: Complete clearance, esophageal coating Pill: Pill Consistency administered: Thin liquids (Level 0) Thin liquids (Level 0): [facility] Penetration/Aspiration Scale Score: Penetration/Aspiration Scale Score 1.  Material does not enter airway: Thin liquids (Level 0); Mildly thick liquids (Level 2, nectar  thick); Moderately thick liquids (Level 3, honey thick); Puree; Solid; Pill Compensatory Strategies: Compensatory Strategies Compensatory strategies: No   General Information: Caregiver present: No  Diet Prior to this Study: Dysphagia 3 (mechanical soft); Thin liquids (Level 0)   Temperature : Normal   Respiratory Status: WFL   Supplemental O2: None (Room air)   History of Recent Intubation: No  Behavior/Cognition: Alert; Cooperative; Pleasant mood Self-Feeding Abilities: Needs assist with self-feeding; Needs set-up for self-feeding Baseline vocal quality/speech: Normal Volitional Cough: Able to elicit Volitional Swallow: Unable to elicit Exam Limitations: No limitations Goal Planning: Prognosis for improved oropharyngeal function: Good No data recorded No data recorded No data recorded No data recorded Pain: No data recorded End of Session: Start Time:SLP Start Time (ACUTE ONLY): 0920 Stop Time: SLP Stop Time (ACUTE ONLY): 0947 Time Calculation:SLP Time Calculation (min) (ACUTE ONLY): 27 min Charges: SLP Evaluations $ SLP Speech Visit: 1 Visit SLP Evaluations $BSS Swallow: 1 Procedure $MBS Swallow: 1 Procedure $Swallowing Treatment: 1 Procedure SLP visit diagnosis: SLP Visit Diagnosis: Dysphagia, oropharyngeal phase (R13.12) Past Medical History: Past Medical History: Diagnosis Date  Allergy   Anal fissure   COPD (chronic obstructive pulmonary disease) (HCC)   Albuterol  PRN.  Hydrocele   Hyperlipidemia   Hypertension   IBS (irritable bowel syndrome) 05/22/13  per chart note-pt denied Past Surgical History: Past Surgical History: Procedure Laterality Date  HERNIA REPAIR  prior to 2013  HYDROCELE EXCISION Bilateral 11/23/2013  Procedure: HYDROCELECTOMY ADULT;  Surgeon: Alm GORMAN Fragmin, MD;  Location: [facility];  Service: Urology;  Laterality: Bilateral;  VASECTOMY  prior to 2013 DeBlois, Consuelo Fitch 04/03/2024, 10:56 AM  CT HEAD WO CONTRAST ( ) Result Date: 04/02/2024 EXAM: CT HEAD WITHOUT  CONTRAST 04/02/2024 04:29:38 AM TECHNIQUE: CT of the head was performed without the administration of intravenous contrast. Automated exposure control, iterative reconstruction, and/or weight based adjustment of the mA/kV was utilized to reduce the radiation dose to as low as reasonably achievable. COMPARISON: Head CT and brain MRI yesterday and earlier. CLINICAL HISTORY: 69 year old male. 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 FINDINGS: BRAIN AND VENTRICLES: Rounded, hyperdense and stippled intra-axial mass lesion of the pons is stable, roughly 25 mm diameter (series 2 image 8). Mild pontine expansion again noted but little if any edema surrounding the hyperdense lesion. No extra-axial or intraventricular extension. Stable gray-white differentiation otherwise. No hydrocephalus.  No extra-axial collection. Calcified atherosclerosis at the skull base. ORBITS: No acute abnormality. SINUSES: Stable paranasal sinus, middle ear and mastoid aeration. Right sphenoid mucoperiosteal thickening again noted. SOFT TISSUES AND SKULL: No acute soft tissue abnormality. No skull fracture. IMPRESSION: 1. 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 2. No new intracranial abnormality. Electronically signed by: Helayne Hurst MD 04/02/2024 04:50 AM EST RP Workstation: [id number]   MR BRAIN W WO CONTRAST Result Date: 04/02/2024 EXAM: MRI BRAIN WITH AND WITHOUT CONTRAST 04/02/2024 12:42:02 AM TECHNIQUE: Multiplanar multisequence MRI of the head/brain was performed with and without the administration of intravenous contrast. COMPARISON: None available. 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 Please get CISS sequences as well as T1 stealth sequences at the request of neurosurgery. FINDINGS: BRAIN AND VENTRICLES: Interval enlargement of the pontine hemorrhage which now measures 2.7 x 2.3 x 1.9 cm and demonstrates popcorn like heterogeneous T2 and T1 signal  without evidence of enhancement. Mild surrounding edema. No hydrocephalus. The sella is unremarkable. Normal flow voids. No abnormal enhancement. Moderate T2 hyperintensities in the white matter, compatible with chronic microvascular ischemic change. ORBITS: No acute abnormality. SINUSES: No acute abnormality. BONES AND SOFT TISSUES: Normal bone marrow signal and enhancement. No acute soft tissue abnormality. Findings discussed with Dr. Khaliqdina via telephone at 12:54 AM. IMPRESSION: 1. 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 A solitary metastasis is a less likely consideration. Electronically signed by: Gilmore Molt 04/02/2024 01:06 AM EST RP Workstation: [id number]   CT Head Wo Contrast Result Date: 04/01/2024 CLINICAL DATA:  History of brainstem bleed, increased or worsened mobility issues and weakness worsened speech EXAM: CT HEAD WITHOUT CONTRAST TECHNIQUE: Contiguous axial images were obtained from the base of the skull through the vertex without intravenous contrast. RADIATION DOSE REDUCTION: This exam was performed according to the departmental dose-optimization program which includes automated exposure control, adjustment of the mA and/or kV according to patient size and/or use of iterative reconstruction technique. COMPARISON:  Head CT 03/18/2024, MRI 03/16/2024, head CT 03/16/2024 FINDINGS: Brain: Focal acute hemorrhage within the pons, this now measures 2.5 x 2.3 cm on series 2, image 10, previously 1.8 x 1.6 cm. Increased surrounding hypodense edema with slightly expanded appearance of the pons. Fourth ventricle remains patent. Slight increased effacement of prepontine cistern/CSF spaces. Otherwise no interval new hemorrhage. Atrophy and chronic small vessel ischemic changes of the white matter. Chronic lacunar infarct in the basal ganglia and thalamus. No  midline shift. Vascular: Mild carotid vascular calcification. Skull: Normal. Negative for fracture or focal lesion. Sinuses/Orbits: No acute finding. Other: None IMPRESSION: 1. Interval increase in size of known pontine hemorrhage, now measuring 2.5 x 2.3 cm, previously 1.8 x 1.6 cm. Increased surrounding hypodense edema with slightly expanded appearance of the pons. Slight increased effacement of prepontine cistern/CSF spaces. Fourth ventricle remains patent. 2. Atrophy and chronic small vessel ischemic changes of the white matter. Chronic lacunar infarcts in the basal ganglia and thalamus. Electronically Signed   By: Luke Bun M.D.   On: 04/01/2024 16:17   DG Chest Portable 1 View Result Date: 04/01/2024 CLINICAL DATA:  Cough, confusion EXAM: PORTABLE CHEST 1 VIEW COMPARISON:  September 09, 2007 FINDINGS: The cardiomediastinal silhouette is unchanged in contour.Tortuous thoracic aorta. No pleural effusion. No pneumothorax. No acute pleuroparenchymal abnormality. IMPRESSION: No acute cardiopulmonary abnormality. Electronically Signed   By: Corean Salter M.D.   On: 04/01/2024  15:49    Microbiology: Results for orders placed or performed during the hospital encounter of 04/01/24  Resp panel by RT-PCR (RSV, Flu A&B, Covid) Anterior Nasal Swab     Status: None   Collection Time: 04/01/24  3:45 PM   Specimen: Anterior Nasal Swab  Result Value Ref Range Status   SARS Coronavirus 2 by RT PCR NEGATIVE NEGATIVE Final   Influenza A by PCR NEGATIVE NEGATIVE Final   Influenza B by PCR NEGATIVE NEGATIVE Final    Comment: (NOTE) The Xpert Xpress SARS-CoV-2/FLU/RSV plus assay is intended as an aid in the diagnosis of influenza from Nasopharyngeal swab specimens and should not be used as a sole basis for treatment. Nasal washings and aspirates are unacceptable for Xpert Xpress SARS-CoV-2/FLU/RSV testing.  Fact Sheet for Patients: bloggercourse.com  Fact Sheet for Healthcare  Providers: seriousbroker.it  This test is not yet approved or cleared by the [place]  FDA and has been authorized for detection and/or diagnosis of SARS-CoV-2 by FDA under an Emergency Use Authorization (EUA). This EUA will remain in effect (meaning this test can be used) for the duration of the COVID-19 declaration under Section 564(b)(1) of the Act, 21 U.S.C. section 360bbb-3(b)(1), unless the authorization is terminated or revoked.     Resp Syncytial Virus by PCR NEGATIVE NEGATIVE Final    Comment: (NOTE) Fact Sheet for Patients: bloggercourse.com  Fact Sheet for Healthcare Providers: seriousbroker.it  This test is not yet approved or cleared by the [place]  FDA and has been authorized for detection and/or diagnosis of SARS-CoV-2 by FDA under an Emergency Use Authorization (EUA). This EUA will remain in effect (meaning this test can be used) for the duration of the COVID-19 declaration under Section 564(b)(1) of the Act, 21 U.S.C. section 360bbb-3(b)(1), unless the authorization is terminated or revoked.  Performed at [facility], [address] [place], [place] [address]   MRSA Next Gen by PCR, Nasal     Status: None   Collection Time: 04/13/24  4:41 PM   Specimen: Nasal Mucosa; Nasal Swab  Result Value Ref Range Status   MRSA by PCR Next Gen NOT DETECTED NOT DETECTED Final    Comment: (NOTE) The GeneXpert MRSA Assay (FDA approved for NASAL specimens only), is one component of a comprehensive MRSA colonization surveillance program. It is not intended to diagnose MRSA infection nor to guide or monitor treatment for MRSA infections. Test performance is not FDA approved in patients less than 53 years old. Performed at [facility], [address], [place], [place] [address]     Labs: CBC: Recent Labs  Lab 04/14/24 0500 04/15/24 0546 04/16/24 0708 04/17/24 0629  04/18/24 0433  WBC 14.3* 15.1* 9.2 6.1 7.0  NEUTROABS 12.5* 12.1* 5.8 3.6 4.2  HGB 13.6 12.7* 13.3 13.3 13.0  HCT 40.3 38.0* 40.4 40.4 38.4*  MCV 83.6 83.2 84.2 84.7 82.4  PLT 269 256 244 217 277   Basic Metabolic Panel: Recent Labs  Lab 04/14/24 0500 04/15/24 0546 04/16/24 0708 04/17/24 0629 04/18/24 0433  NA 141 141 139 136 138  K 3.9 4.1 3.6 4.0 3.6  CL 106 109 107 107 104  CO2 21* 22 24 18* 25  GLUCOSE 157* 122* 100* 112* 98  BUN 16 18 17 15 16   CREATININE 1.15 0.99 1.02 0.93 0.95  CALCIUM  9.1 8.7* 8.8* 8.8* 8.9   Liver Function Tests: No results for input(s): AST, ALT, ALKPHOS, BILITOT, PROT, ALBUMIN  in the last 168 hours. CBG: Recent Labs  Lab 04/19/24 1211 04/19/24 1717 04/19/24 2143 04/20/24 0810 04/20/24 1122  GLUCAP 115* 86 131* 125* 129*    Discharge time spent: 35 minutes.   Signed: [name], MD [facility] 04/20/2024 "

## 2024-04-20 NOTE — Progress Notes (Signed)
 In regards to patient's diabetes mellitus maintained on Glucophage  500 mg twice daily as well as Glucotrol  5 mg twice daily and Mounjaro  5 mg into the skin weekly.  Will discontinue scheduled Lantus  insulin  that was initiated during patient's hospitalization stay and continue Glucophage  alone resuming Glucotrol  as needed.

## 2024-04-20 NOTE — Progress Notes (Signed)
 Occupational Therapy Treatment Patient Details Name: Larry Dawson MRN: [id number] DOB: September 18, 1955 Today's Date: 04/20/2024   History of present illness 69 yo M adm 04/01/24 with Rt weakness with increased pontine hemorrhage consistent with cavernoma. 04/13/24 craniotomy. PMHx: admission 12/8-12/10 with ICH. HTN, HLD, COPD, IBS, Hernia repair, T2DM   OT comments  Pt supine in bed and agreeable to OT session.  Increased assist to exit bed towards L side, mod assist.  Sitting EOB during ADLs with contact guard assist, increased effort to use R UE to wipe face.  Needing +2 to stand with R, heavy posterior L push after initial push up making it unsafe to complete with +1 assist. +2 max assist to stand at EOB with heavy R lateral lean and no awareness.  Transitioned to use of stedy with contact guard assist to stand and pivoting to chair.  Overall needing total assist +2 for LB ADLs.  From recliner worked on chair pushups to simulate beginning for transfers and to encourage sustained forward lean.  Remains limited by R sided sensory and proprioceptive deficits, difficulty with midline position.  May benefit from use of mirror next session.  Will follow acutely. Continue to recommend >3hrs/day inpatient setting at dc.       If plan is discharge home, recommend the following:  A lot of help with bathing/dressing/bathroom;Assistance with cooking/housework;Assist for transportation;Help with stairs or ramp for entrance;Direct supervision/assist for medications management;Direct supervision/assist for financial management;A lot of help with walking and/or transfers   Equipment Recommendations  Other (comment) (defer)    Recommendations for Other Services Rehab consult    Precautions / Restrictions Precautions Precautions: Fall;Other (comment) Recall of Precautions/Restrictions: Impaired Precaution/Restrictions Comments: Rt weakness, Rt hearing loss, Rt gaze Required Braces or Orthoses: Other  Brace Other Brace: resting hand splint for night time use R UE Restrictions Weight Bearing Restrictions Per Provider Order: No       Mobility Bed Mobility Overal bed mobility: Needs Assistance Bed Mobility: Rolling, Sidelying to Sit Rolling: Mod assist Sidelying to sit: Mod assist       General bed mobility comments: rolling towards L side and transitioning from sidelying to sitting.  Needing increased assist compared to exiting on R side of bed    Transfers Overall transfer level: Needs assistance Equipment used: Standard walker, Ambulation equipment used Transfers: Sit to/from Stand Sit to Stand: Max assist, +2 physical assistance, Contact guard assist           General transfer comment: pt cueing to scoot to EOB prior to transfer, worked on pushing from bed and forward lean but with each transfer into standing pt pushing posteriorly to L and unable to maintain R hand on bed or RW.  Needing max assist +2 to fully power up and steady from EOB with RW.  Using stedy, pulling from front bar able to stand with contact guard. Transfer via Lift Equipment: Stedy   Balance Overall balance assessment: Needs assistance Sitting-balance support: Bilateral upper extremity supported, Feet supported Sitting balance-Leahy Scale: Fair Sitting balance - Comments: statically contact guard.   Standing balance support: Bilateral upper extremity supported, During functional activity Standing balance-Leahy Scale: Poor Standing balance comment: relies on external and UE support                           ADL either performed or assessed with clinical judgement   ADL Overall ADL's : Needs assistance/impaired     Grooming: Contact guard assist;Wash/dry face;Sitting  Lower Body Dressing: Total assistance;+2 for physical assistance;Sit to/from stand   Toilet Transfer: Total assistance;+2 for physical assistance Toilet Transfer Details (indicate cue type and  reason): simulated using stedy to recliner         Functional mobility during ADLs: Maximal assistance;+2 for physical assistance;Contact guard assist;Rolling walker (2 wheels);Cueing for sequencing;Cueing for safety (stedy)      Extremity/Trunk Assessment              Vision   Vision Assessment?: Yes Additional Comments: pt coniutes to reports diplopia if glasses are off.  Patient with improved ROM past midline to L of R eye, L eye moving more but not quite to midline.  With glasses off reports no diplopia when looking up/down, but present with primary gaze, L and R gaze   Perception     Praxis     Communication Communication Communication: Impaired Factors Affecting Communication: Hearing impaired;Reduced clarity of speech   Cognition Arousal: Alert Behavior During Therapy: WFL for tasks assessed/performed Cognition: Cognition impaired     Awareness: Online awareness impaired Memory impairment (select all impairments): Short-term memory, Working memory Attention impairment (select first level of impairment): Sustained attention Executive functioning impairment (select all impairments): Problem solving, Sequencing, Organization, Reasoning OT - Cognition Comments: patient with decreased recall of tasks during session, cueing for sequencing and following mulitple step commands.  less impulsive today compared to last session                 Following commands: Impaired Following commands impaired: Follows one step commands with increased time, Only follows one step commands consistently, Follows multi-step commands inconsistently      Cueing   Cueing Techniques: Verbal cues, Tactile cues, Gestural cues  Exercises Exercises: Other exercises Other Exercises Other Exercises: WOrked on seated chair pushups, therapist supporting R hand to maintain position with max cueing to sustain forward lean during ascend and descend.  COntinues to push posteriorly and to the L.   Completed x 10 reps.    Shoulder Instructions       General Comments      Pertinent Vitals/ Pain       Pain Assessment Pain Assessment: Faces Faces Pain Scale: Hurts a little bit Pain Location: R LE Pain Descriptors / Indicators: Discomfort, Sore Pain Intervention(s): Limited activity within patient's tolerance, Monitored during session, Repositioned  Home Living                                          Prior Functioning/Environment              Frequency  Min 2X/week        Progress Toward Goals  OT Goals(current goals can now be found in the care plan section)  Progress towards OT goals: Progressing toward goals     Plan      Co-evaluation                 AM-PAC OT 6 Clicks Daily Activity     Outcome Measure   Help from another person eating meals?: A Little Help from another person taking care of personal grooming?: A Little Help from another person toileting, which includes using toliet, bedpan, or urinal?: Total Help from another person bathing (including washing, rinsing, drying)?: A Lot Help from another person to put on and taking off regular upper body clothing?: A Lot Help from another  person to put on and taking off regular lower body clothing?: A Lot 6 Click Score: 13    End of Session Equipment Utilized During Treatment: Gait belt;Other (comment);Rolling walker (2 wheels) (stedy)  OT Visit Diagnosis: Unsteadiness on feet (R26.81);Other abnormalities of gait and mobility (R26.89);Muscle weakness (generalized) (M62.81);Low vision, both eyes (H54.2);Other symptoms and signs involving the nervous system (R29.898);Hemiplegia and hemiparesis Hemiplegia - Right/Left: Right Hemiplegia - dominant/non-dominant: Dominant Hemiplegia - caused by: Nontraumatic intracerebral hemorrhage   Activity Tolerance Patient tolerated treatment well   Patient Left in chair;with call bell/phone within reach;with chair alarm set   Nurse  Communication Mobility status;Precautions        Time: 9160-9078 OT Time Calculation (min): 42 min  Charges: OT General Charges $OT Visit: 1 Visit OT Treatments $Self Care/Home Management : 8-22 mins $Therapeutic Activity: 8-22 mins $Neuromuscular Re-education: 8-22 mins  Etta NOVAK, OT Acute Rehabilitation Services Office [phone] Secure Chat Preferred    Etta GORMAN Hope 04/20/2024, 10:08 AM

## 2024-04-20 NOTE — Progress Notes (Signed)
 Inpatient Rehabilitation Admission Medication Review by a Pharmacist  A complete drug regimen review was completed for this patient to identify any potential clinically significant medication issues.  High Risk Drug Classes Is patient taking? Indication by Medication  Antipsychotic No   Anticoagulant Yes Sq heparin  - VTE ppx  Antibiotic No   Opioid No   Antiplatelet No   Hypoglycemics/insulin  Yes Insulin , metformin  - DM  Vasoactive Medication Yes Amlodipine , atenolol  - HTN  Chemotherapy No   Other Yes Ondansetron  prn N/V Meclizine  prn dizziness Pantoprazole  - reflux Rosuvastatin  - HLD     Type of Medication Issue Identified Description of Issue Recommendation(s)  Drug Interaction(s) (clinically significant)     Duplicate Therapy     Allergy     No Medication Administration End Date     Incorrect Dose     Additional Drug Therapy Needed     Significant med changes from prior encounter (inform family/care partners about these prior to discharge).    Other       Clinically significant medication issues were identified that warrant physician communication and completion of prescribed/recommended actions by midnight of the next day:  No  Name of provider notified for urgent issues identified:   Provider Method of Notification:     Pharmacist comments: None  Time spent performing this drug regimen review (minutes):  20 minutes  Thank you. Olam Monte, PharmD

## 2024-04-20 NOTE — Discharge Summary (Shared)
 Physician Discharge Summary  Patient ID: Larry Dawson MRN: [id number] DOB/AGE: 08-Aug-1955 69 y.o.  Admit date: 04/20/2024 Discharge date: 05/15/2024  Discharge Diagnoses:  Principal Problem:   Cavernoma Active Problems:   Coping style affecting medical condition DVT prophylaxis Diabetes mellitus Hyperlipidemia Hypertension Class II obesity COPD/tobacco use Constipation/IBS  Discharged Condition: Stable  Significant Diagnostic Studies: CT HEAD WO CONTRAST ( ) Result Date: 05/06/2024 CLINICAL DATA:  Initial evaluation for acute altered mental status. EXAM: CT HEAD WITHOUT CONTRAST TECHNIQUE: Contiguous axial images were obtained from the base of the skull through the vertex without intravenous contrast. RADIATION DOSE REDUCTION: This exam was performed according to the departmental dose-optimization program which includes automated exposure control, adjustment of the mA and/or kV according to patient size and/or use of iterative reconstruction technique. COMPARISON:  Prior MRI from 04/14/2024. 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 Trace residual subacute hemorrhage present at this location (series 2, image 8). No evidence for new hemorrhage. Basilar cisterns remain patent as does the adjacent fourth ventricle. Small low-density subdural collection overlying the left cerebral convexity most likely reflects a small subdural hygroma, new from prior. No significant mass effect or midline shift. No other acute intracranial hemorrhage. No acute large vessel territory infarct. No mass lesion or midline shift. No hydrocephalus. Underlying atrophy with moderately advanced chronic microvascular ischemic disease again noted, stable. Vascular: No abnormal hyperdense vessel. Calcified atherosclerosis  present at skull base. Skull: Prior left suboccipital craniotomy without adverse features. No acute scalp soft tissue abnormality. Calvarium otherwise intact. Sinuses/Orbits: Globes orbital soft tissues within normal limits. Changes of chronic right sphenoid sinusitis. Small left mastoid effusion noted. Other: None. IMPRESSION: 1. 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 Trace residual subacute hemorrhage at this location. No evidence for new hemorrhage. 2. Small low-density subdural collection overlying the left cerebral convexity, new from prior, and most likely reflecting a small subdural hygroma. No significant mass effect or midline shift. 3. No other acute intracranial abnormality. 4. Underlying atrophy with moderately advanced chronic microvascular ischemic disease. Electronically Signed   By: Morene Hoard M.D.   On: 05/06/2024 19:16   VAS US  LOWER EXTREMITY VENOUS (DVT) Result Date: 04/28/2024  Lower Venous DVT Study Patient Name:  Larry Dawson  Date of Exam:   04/27/2024 Medical Rec #: [id number]        Accession #:    [id number] Date of Birth: 14-Oct-1955        Patient Gender: M Patient Age:   69 years Exam Location:  [facility] Procedure:      VAS US  LOWER EXTREMITY VENOUS (DVT) Referring Phys: MURRAY COLLIER --------------------------------------------------------------------------------  Indications: Pain, and weakness, tightness.  Comparison Study: No prior exam. Performing Technologist: Edilia Elden Appl  Examination Guidelines: A complete evaluation includes B-mode imaging, spectral Doppler, color Doppler, and power Doppler as needed of all accessible portions of each vessel. Bilateral testing is considered an integral part of a complete examination. Limited examinations for reoccurring indications may be performed as noted. The reflux portion of the exam is performed with the patient in  reverse Trendelenburg.  +---------+---------------+---------+-----------+----------+--------------+ RIGHT    CompressibilityPhasicitySpontaneityPropertiesThrombus Aging +---------+---------------+---------+-----------+----------+--------------+ CFV      Full           Yes      Yes                                 +---------+---------------+---------+-----------+----------+--------------+  SFJ      Full           Yes      Yes                                 +---------+---------------+---------+-----------+----------+--------------+ FV Prox  Full                                                        +---------+---------------+---------+-----------+----------+--------------+ FV Mid   Full                                                        +---------+---------------+---------+-----------+----------+--------------+ FV DistalFull                                                        +---------+---------------+---------+-----------+----------+--------------+ PFV      Full                                                        +---------+---------------+---------+-----------+----------+--------------+ POP      Full           No       No                                  +---------+---------------+---------+-----------+----------+--------------+ PTV      Full                                                        +---------+---------------+---------+-----------+----------+--------------+ PERO     Full                                                        +---------+---------------+---------+-----------+----------+--------------+   +----+---------------+---------+-----------+----------+--------------+ LEFTCompressibilityPhasicitySpontaneityPropertiesThrombus Aging +----+---------------+---------+-----------+----------+--------------+ CFV Full           Yes      Yes                                  +----+---------------+---------+-----------+----------+--------------+ SFJ Full           Yes      Yes                                 +----+---------------+---------+-----------+----------+--------------+     Summary: RIGHT: -  There is no evidence of deep vein thrombosis in the lower extremity.  - No cystic structure found in the popliteal fossa.  LEFT: - No evidence of common femoral vein obstruction.   *See table(s) above for measurements and observations. Electronically signed by Penne Colorado MD on 04/28/2024 at 10:11:47 AM.    Final    DG Ankle Complete Right Result Date: 04/18/2024 EXAM: 3 OR MORE VIEW(S) XRAY OF THE ANKLE 04/18/2024 04:45:00 PM CLINICAL HISTORY: Ankle pain COMPARISON: None available. FINDINGS: BONES AND JOINTS: No acute fracture. No malalignment. SOFT TISSUES: Ossific density along the course of the Achilles tendon. IMPRESSION: 1. Mild calcific tendinitis of the achilles tendon. Electronically signed by: Dorethia Molt MD MD 04/18/2024 08:11 PM EST RP Workstation: [id number]   DG Swallowing Func-Speech Pathology Result Date: 04/16/2024 Table formatting from the original result was not included.  04/14/24 1000 SLP Visit Information SLP Received On 04/14/24 General Information HPI Larry Dawson has known history of pontine lesion concerning for cavernoma.  He has now undergone posterior craniotomy with tumor resection on 1/5 and SLP asked to reassess swallow via MBS.  Previously evaluated with MBS prior to surgery to ensure safety with swallowing. Pt demonstrated delayed swallow intiaiton, but no aspiration, recommended to consume regular/thin and has tolerated well. Caregiver present No Diet Prior to this Study NPO Temperature  Normal Respiratory Status WFL History of Recent Intubation Yes Behavior/Cognition Alert;Cooperative;Pleasant mood Self-Feeding Abilities Needs assist with self-feeding;Needs set-up for self-feeding Baseline vocal quality/speech Normal Volitional Cough Able  to elicit Volitional Swallow (NT) Anatomy WFL Boluses Administered Boluses Administered Thin liquids (Level 0);Mildly thick liquids (Level 2, nectar thick);Moderately thick liquids (Level 3, honey thick);Puree;Solid Oral Impairment Domain Lip Closure Escape beyond mid-chin Tongue control during bolus hold Posterior escape of less than half of bolus Bolus preparation/mastication Timely and efficient chewing and mashing Bolus transport/lingual motion Brisk tongue motion Oral residue Trace residue lining oral structures Location of oral residue  Tongue Initiation of pharyngeal swallow  Pyriform sinuses Pharyngeal Impairment Domain Soft palate elevation No bolus between soft palate (SP)/pharyngeal wall (PW) Laryngeal elevation Complete superior movement of thyroid  cartilage with complete approximation of arytenoids to epiglottic petiole Anterior hyoid excursion Complete anterior movement Epiglottic movement Complete inversion Laryngeal vestibule closure Complete, no air/contrast in laryngeal vestibule Pharyngeal stripping wave  Present - complete Tongue base retraction No contrast between tongue base and posterior pharyngeal wall (PPW) Pharyngeal residue Complete pharyngeal clearance (Has spillage of oral residue to pharynx post swallow) Penetration/Aspiration Scale Score 1.  Material does not enter airway Thin liquids (Level 0);Mildly thick liquids (Level 2, nectar thick);Puree;Solid 3.  Material enters airway, remains ABOVE vocal cords and not ejected out Thin liquids (Level 0) 8.  Material enters airway, passes BELOW cords without attempt by patient to eject out (silent aspiration)  Thin liquids (Level 0) Clinical Impression Clinical Impression Pt demonstrates slightly worsened swallow function since last MBS, though impairments are mild and Pt continues to be able to eat and drink foods of choice with minimal aspiration risk if using precautions. Pt has mild oral sensory deficit on the right (CN V) leading to slight  anterior loss with liquids and mild oral residue that does sometimes spill to the pharynx post swallow without pt awareness. As seen previously there is a consistent mild delay in swallowing initiation and today, with large consecutive sips pt had trace penetration and aspiration without sensation. Suspect laryngeal sensation also impacted. Taking small single sips, clearing throat occasionally and doing a dry swallow to  clear any oral and pharyngeal residue are all helpful. Pt is recommended to consume a regular diet and thin liquids, however he reports that he has only been eating applesauce and jello in the days leading up to surgery because he felt his swallowing was off. SLP emphasized that he might have some numbness, but his strength is quite good and good nutrition is important at this time. Encouraged pt to try a mechanical soft diet and thin liquids. SLP will f/u to reinforce results and strategies. SLP Visit Diagnosis Dysphagia, oropharyngeal phase (R13.12) Exam Limitations No limitations Swallowing Evaluation Recommendations Liquid Administration via Cup;Straw (but no consecutive sips) Medication Administration Whole meds with puree Supervision Set-up assistance for safety;Staff to assist with self-feeding;Other (comment) (mightneed help due to vision) Swallowing strategies   Slow rate;Small bites/sips;Multiple dry swallows after each bite/sip;Clear throat intermittently Postural changes Position pt fully upright for meals Oral care recommendations Oral care BID (2x/day) Treatment Plan Treatment recommendations Therapy as outlined in treatment plan below Follow-up recommendations Acute inpatient rehab (3 hours/day) Functional status assessment Patient has had a recent decline in their functional status and demonstrates the ability to make significant improvements in function in a reasonable and predictable amount of time. Treatment frequency Min 2x/week Treatment duration 2 weeks Interventions  Aspiration precaution training;Compensatory techniques;Patient/family education Goal Planning Prognosis for improved oropharyngeal function Good SLP Time Calculation SLP Start Time (ACUTE ONLY) 0935 SLP Stop Time (ACUTE ONLY) 0955 SLP Time Calculation (min) (ACUTE ONLY) 20 min SLP Evaluations $ SLP Speech Visit 1 Visit SLP Evaluations $MBS Swallow 1 Procedure   MR BRAIN WO CONTRAST Result Date: 04/14/2024 EXAM: MRI BRAIN WITHOUT CONTRAST 04/14/2024 04:01:43 AM TECHNIQUE: Multiplanar multisequence MRI of the head/brain was performed without the administration of intravenous contrast. COMPARISON: MR Head Without Contrast 04/11/2024 and earlier. CLINICAL HISTORY: 69 year old male with central pontine hemorrhage, recently postoperative from braintem hematoma vs cavernoma resection. FINDINGS: BRAIN AND VENTRICLES: Stable major intracranial vascular flow voids including dominant distal left vertebral artery which appears to be the primary supply to basilar. Postoperative changes in the left posterior fossa tracking to the left cerebellopontine angle and cystic resection there of previous relatively large and rounded nearly 3 cm pontine hemorrhage / hematoma (series 8 image 10). Residual blood products but no measurable hematoma now on T1 or T2 weighted imaging. Regional brainstem and left cerebellar peduncle T2 and FLAIR hyperintense edema. Pontine expansion has regressed. Basilar cisterns remain patent. Susceptibility weighted imaging also suggestive of left hemisphere and parasagittal postoperative pneumocephalus. Heterogeneous T2 and FLAIR hyperintensity in the deep gray nuclei including bilateral thalami is stable. No convincing diffusion restriction when allowing for susceptibility artifact on DWI. Patchy and confluent widely scattered cerebral white matter T2 and FLAIR hyperintensity is stable. Trace intraventricular hemorrhage layering in the occipital horns is new. No ventriculomegaly. The sella is unremarkable.  No midline shift. ORBITS: Rightward gaze deviation now. SINUSES AND MASTOIDS: Stable sphenoid sinus disease. Mastoid air cells remain well aerated. BONES AND SOFT TISSUES: Evidence of suboccipital craniotomy now. Postoperative changes to the left scalp. Normal marrow signal. IMPRESSION: 1. No adverse features status post left suboccipital craniotomy and brainstem hematoma resection. Expected postoperative changes including probable pneumocephalus. 2. No new intracranial abnormality. 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 Electronically signed by: Helayne Hurst MD 04/14/2024 06:29 AM EST RP Workstation: [id number]   DG Abd Portable 1V Result Date: 04/13/2024 EXAM: 1 VIEW XRAY OF THE ABDOMEN 04/13/2024 04:22:48 PM COMPARISON: None available. CLINICAL HISTORY: Encounter for nasogastric (NG) tube placement FINDINGS:  LINES, TUBES AND DEVICES: Nasogastric tube in place with tip overlying the distal esophagus. BOWEL: Nonobstructive bowel gas pattern. SOFT TISSUES: No abnormal calcifications. BONES: No acute fracture. IMPRESSION: 1. Nasogastric tube tip overlies the distal esophagus; recommend advancement into the stomach. Electronically signed by: Greig Pique MD 04/13/2024 05:19 PM EST RP Workstation: [id number]    Labs:  Basic Metabolic Panel: Recent Labs  Lab 05/06/24 1159 05/07/24 0510 05/08/24 0621 05/11/24 0449  NA 138 138 140 140  K 3.9 4.0 3.9 4.0  CL 102 104 103 105  CO2 26 25 27 27   GLUCOSE 131* 118* 111* 122*  BUN 11 13 14 15   CREATININE 1.09 1.08 1.00 1.03  CALCIUM  9.5 9.3 9.3 9.1  MG  --   --  1.6*  --     CBC: Recent Labs  Lab 05/07/24 0510 05/08/24 0621 05/11/24 0449  WBC 5.4 5.4 6.4  NEUTROABS  --  2.6  --   HGB 12.7* 13.0 12.7*  HCT 37.9* 38.8* 38.6*  MCV 83.5 83.6 84.8  PLT 219 234 215    CBG: Recent Labs  Lab 05/06/24 0606  GLUCAP 106*    Brief HPI:   Larry Dawson is a 69 y.o. 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  Per chart review patient lives with spouse.  Two-level home bed and bath upstairs 4 steps to enter.  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  Presented 03/12/2024 with increasing right sided weakness numbness ataxia and intermittent diplopia with slurring of speech and difficulty swallowing.  Patient also with issues of mild headache.  Denied any nausea or vomiting.  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  Admission chemistries unremarkable except potassium 3.1 urine drug screen negative.  Recent echocardiogram with ejection fraction of 50 to 55% no wall motion abnormalities.  Patient underwent posterior craniotomy with tumor resection 04/13/2024 per Dr. [name].  He was cleared to begin subcutaneous heparin  for DVT prophylaxis 04/14/2024.  Therapy evaluations completed due to patient's decreased functional mobility was admitted for a comprehensive rehab program.   Hospital Course: Larry Dawson was admitted to rehab 04/20/2024 for inpatient therapies to consist of PT, ST and OT at least three hours five days a week. Past admission physiatrist, therapy team and rehab RN have worked together to provide customized collaborative inpatient rehab.  Pertaining to patient's brainstem cavernoma status post posterior craniotomy tumor resection 04/13/2024 per Dr. [name].  Surgical site clean and dry.  Latest follow-up cranial CT scan 05/07/2024 showed postoperative changes from prior left suboccipital craniotomy with resection.  Trace residual subacute hemorrhage  at that location.  No evidence for new hemorrhage.  No significant mass effect or midline shift no other acute intracranial abnormalities.  Patient cleared for subcutaneous heparin  for DVT prophylaxis 04/14/2024 no bleeding episodes.  History of diabetes mellitus hemoglobin A1c 7.2 maintained currently on Glucophage  500 mg twice daily and Mounjaro  could be resumed at discharge.  Crestor  ongoing for hyperlipidemia.  Blood pressure controlled and monitored on Norvasc  as well as Tenormin  monitored with increased mobility.  Class II obesity BMI 35.29 with dietary follow-up.  COPD with  history of tobacco use monitoring of oxygen saturations discussed no nicotine with patient.  Bouts of constipation resolved with laxative assistance.  No nausea vomiting.   Blood pressures were monitored on TID basis and remained controlled and monitored  Diabetes has been monitored with ac/hs CBG checks and SSI was use prn for tighter BS control.    Rehab course: During patient's stay in rehab weekly team conferences were held to monitor patient's progress, set goals and discuss barriers to discharge. At admission, patient required moderate assist sit to stand max assist stand pivot transfers  He/She  has had improvement in activity tolerance, balance, postural control as well as ability to compensate for deficits. He/She has had improvement in functional use RUE/LUE  and RLE/LLE as well as improvement in awareness.  Patient completes gait training for 15 minutes for a total of 540 feet with mod max assist of 2 initially with significant loss of balance requiring second person time to correct balance progressing to mod min assist for gait for rest of trial.  It was stressed with family need for assistance due to his balance issues.  He was able to ambulate to the bathroom complete transfers with min mod assist required mod assist for standing.  Patient was fitted with AFO brace.  Bed mobility minimal assist for trunk elevation.   Sit to stand with moderate assist due to posterior lateral bias.  Required verbal cues for safe pacing.  Upper body bathing with distal support of right upper extremity to improve functional reach.  Dressing with light assist improved carryover of Hemi dressing techniques.  Lower body bathing with assist to reach distal lower extremities and posterior PERI care and standing.  SLP conducted skilled therapy sessions targeting cognitive goals complex deduction puzzle where patient benefited from supervision fading to modified independent for accurate adduction of all answers.  Patient selectively attended to task with supervision modified independent for duration of sessions.  Full family teaching completed plan discharge to home       Disposition:  There are no questions and answers to display.         Diet: Diabetic diet  Special Instructions: No driving smoking or alcohol   Medications at discharge 1.  Vitamin C 1 tablet daily 2.  Tenoretic  50  mg daily 3.  Zyrtec  10 mg daily 4.  Vitamin D  1000 units p.o. daily 5.  MiraLAX  daily 6.  Glucophage  500 mg p.o. twice daily 7.  Multivitamin daily 8.  Prilosec 20 mg daily 9.  Mounjaro  5 mg into the skin weekly 10.  Crestor  20 mg nightly 11.  Tylenol  as needed 12.  Antivert  25 mg 3 times daily as needed dizziness 13.  Voltaren  gel 2 g 4 times daily 14.  Norvasc  5 mg daily 15.  Colace 100 mg twice daily 16.  Robaxin  500 mg every 6 hours as needed muscle spasms 17.  Baclofen  5 mg p.o. 3 times daily  Discharge Instructions     Ambulatory referral to Occupational Therapy   Complete by: As directed    Evaluate and treat   Ambulatory referral to Physical Medicine Rehab   Complete by: As directed    Moderate complexity follow-up 1 to 2 weeks brainstem cavernoma   Ambulatory referral to Physical Therapy   Complete by: As directed    Evaluate and treat   Ambulatory referral to Speech Therapy   Complete by: As directed    Evaluate and  treat     14.  Baclofen  10  mg 3 times daily 15.  Norvasc  5 mg daily   Allergies as of 05/12/2024       Reactions   Ace Inhibitors Swelling   Suspected angioedema   Penicillins Anaphylaxis, Hives, Rash, Other (See Comments)   Throat swells   Latex Hives, Swelling, Rash     Med Rec must be completed prior to using this [facility]***       Follow-up Information     Emeline Search C, DO Follow up.   Specialty: Physical Medicine and Rehabilitation Why: Office to call appointment Contact information: [address] [place] [address] [phone]         [name] Dino HERO, MD Follow up.   Specialty: Neurosurgery Why: call for appointment Contact information: [address] [place] [address] [place] [address] [phone]         [name], MD Follow up.   Specialty: Internal Medicine Why: Call for appointment within 1 to 2 weeks Contact information: [address] [place] [address] [phone]                 Signed: Toribio PARAS Jovon Winterhalter 05/12/2024, 8:21 AM

## 2024-04-20 NOTE — TOC Transition Note (Signed)
 Transition of Care (TOC) - Discharge Note Rayfield Gobble RN,BSN Inpatient Care Management Unit 4NP (Non Trauma)- RN Case Manager See Treatment Team for direct Phone #   Patient Details  Name: Larry Dawson MRN: [id number] Date of Birth: Aug 10, 1955  Transition of Care [facility]) CM/SW Contact:  Gobble Rayfield Hurst, RN Phone Number: 04/20/2024, 2:13 PM   Clinical Narrative:    Notified by Davene CALKINS rehab liaison that pt has insurance auth and bed available today for admit to rehab.  Pt stable for transition today to [facility]- unit staff will transport to [facility] unit once bed has been assigned.   No further ICM needs noted.    Final next level of care: IP Rehab Facility Barriers to Discharge: Barriers Resolved   Patient Goals and CMS Choice Patient states their goals for this hospitalization and ongoing recovery are:: be able to return home   Choice offered to / list presented to : Patient      Discharge Placement                 [facility]      Discharge Plan and Services Additional resources added to the After Visit Summary for     Discharge Planning Services: CM Consult Post Acute Care Choice: IP Rehab          DME Arranged: N/A DME Agency: NA       HH Arranged: NA HH Agency: NA        Social Drivers of Health (SDOH) Interventions SDOH Screenings   Food Insecurity: No Food Insecurity (04/02/2024)  Housing: Low Risk (04/02/2024)  Transportation Needs: No Transportation Needs (04/02/2024)  Utilities: Not At Risk (04/02/2024)  Alcohol  Screen: Low Risk (01/04/2024)  Depression (PHQ2-9): Low Risk (05/17/2023)  Financial Resource Strain: Low Risk (01/04/2024)  Physical Activity: Insufficiently Active (01/04/2024)  Social Connections: Socially Integrated (04/02/2024)  Stress: No Stress Concern Present (01/04/2024)  Tobacco Use: Medium Risk (04/13/2024)  Health Literacy: Adequate Health Literacy (05/17/2023)     Readmission Risk  Interventions    04/20/2024    2:13 PM  Readmission Risk Prevention Plan  Transportation Screening Complete  Home Care Screening Complete  Medication Review (RN CM) Complete

## 2024-04-20 NOTE — Progress Notes (Signed)
 "  Signed      Expand All Collapse All PMR Admission Coordinator Pre-Admission Assessment   Patient: Larry Dawson is an 69 y.o., male MRN: [id number] DOB: 07-03-1955 Height: 5' 7 (170.2 cm) Weight: 102.2 kg   Insurance Information HMO: yes    PPO:      PCP:      IPA:      80/20:      OTHER:  PRIMARY: [employer]      Policy#: [id number] ; Medicare [id number]     Subscriber: pt CM Name: TBD      Phone#: TBD     Fax#: [phone] Pre-Cert#: [id number] Auth for CIR via fax on 04/20/24 from [facility] for admit 04/20/24. Per fax, continued stay updates not required. Discharge notice due within 24 hours of discharge to [phone].        Employer:  Benefits:  Phone #: [phone]     Name: 1/8 Eff. Date: 04/09/24     Deduct: none      Out of Pocket Max: $3950      Life Max: none CIR: $295 co pay per day days 1 until 5      SNF: $20 co pay per day days 1 until 20; $218 co pay per day days 21 until 100 Outpatient: $25 per visit     Co-Pay:  Home Health: 100%      Co-Pay:  DME: 80%     Co-Pay: 20% Providers: in network  SECONDARY: none      Policy#:      Phone#:    Artist:       Phone#:    The Best Boy for patients in Inpatient Rehabilitation Facilities with attached Privacy Act Statement-Health Care Records was provided and verbally reviewed with: Patient and Family   Emergency Contact Information Contact Information       Name Relation Home Work Mobile    Larry Dawson,Larry Dawson Spouse [phone]   [phone]    Larry Dawson, Larry Dawson Daughter     [phone]         Other Contacts   None on File      Current Medical History  Patient Admitting Diagnosis: Cerebral cavernous malformation   History of Present Illness: 69 yo male with history of COPD, HTN, HLD, type 2 DM. 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 Presented to ED on 04/01/24 with worsening right upper and lower extremity  weakness/numbness, ataxia, diplopia, slurring speech and difficulty swallowing.    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 Postoperative ICU management  muscle spasms of left neck.Steroids, tylenol  and muscle relaxant.    CBGS well controlled with SSI and CBGS. Continue atenolol  for HTN. Heparin  for DVT prophylaxis.      Patient's medical record from [facility] has been reviewed by the rehabilitation admission coordinator and physician.   Past Medical History      Past Medical History:  Diagnosis Date   Allergy     Anal fissure     COPD (chronic obstructive pulmonary disease) (HCC)      Albuterol  PRN.   Diabetes mellitus without complication (HCC)     Hydrocele     Hyperlipidemia     Hypertension     IBS (irritable bowel syndrome) 05/22/2013    per chart note-pt denied        Has the patient had major surgery during 100 days prior to admission? Yes  Family History   family history includes Diabetes in his brother, brother, brother, and father; Hypertension in his brother and father.   Current Medications [Current Medications]  [Current Medications]    Current Facility-Administered Medications:    acetaminophen  (TYLENOL ) tablet 650 mg, 650 mg, Oral, Q6H PRN, Drusilla Sabas RAMAN, MD, 650 mg at 04/20/24 0541   alum & mag hydroxide-simeth (MAALOX/MYLANTA) 200-200-20 MG/5ML suspension 30 mL, 30 mL, Oral, Q4H PRN, Claudene Toribio BROCKS, MD   amLODipine  (NORVASC ) tablet 5 mg, 5 mg, Oral, Daily, Drusilla, Sabas RAMAN, MD, 5 mg at 04/20/24 0813   artificial tears ophthalmic solution 1 drop, 1 drop, Left Eye, Q2H while awake, Claudene Toribio BROCKS, MD, 1 drop at 04/19/24 1739   atenolol  (TENORMIN ) tablet 50 mg, 50 mg, Oral, Daily, Claudene Toribio BROCKS, MD, 50 mg at 04/20/24 0817   docusate (COLACE) 50 MG/5ML liquid 100 mg, 100 mg, Oral, BID, Claudene Toribio BROCKS, MD, 100 mg at 04/20/24 0813   feeding supplement (ENSURE PLUS HIGH  PROTEIN) liquid 237 mL, 237 mL, Oral, BID BM, Claudene Toribio BROCKS, MD, 237 mL at 04/20/24 0819   heparin  injection 5,000 Units, 5,000 Units, Subcutaneous, Q12H, Claudene Toribio BROCKS, MD, 5,000 Units at 04/20/24 0813   hydrALAZINE  (APRESOLINE ) injection 5 mg, 5 mg, Intravenous, Q4H PRN, Rathore, Vasundhra, MD   insulin  aspart (novoLOG ) injection 0-9 Units, 0-9 Units, Subcutaneous, TID AC & HS, Claudene Toribio BROCKS, MD, 1 Units at 04/20/24 1223   insulin  glargine (LANTUS ) injection 8 Units, 8 Units, Subcutaneous, QHS, Drusilla Sabas RAMAN, MD, 8 Units at 04/19/24 2225   labetalol  (NORMODYNE ) injection 10-40 mg, 10-40 mg, Intravenous, Q10 min PRN, Janjua, Rashid M, MD, 20 mg at 04/15/24 9082   meclizine  (ANTIVERT ) tablet 25 mg, 25 mg, Oral, TID PRN, Claudene Toribio BROCKS, MD, 25 mg at 04/15/24 9182   metFORMIN  (GLUCOPHAGE ) tablet 500 mg, 500 mg, Oral, BID WC, Claudene Toribio BROCKS, MD, 500 mg at 04/20/24 9186   ondansetron  (ZOFRAN ) tablet 4 mg, 4 mg, Oral, Q4H PRN **OR** ondansetron  (ZOFRAN ) injection 4 mg, 4 mg, Intravenous, Q4H PRN, Claudene Toribio BROCKS, MD   Oral care mouth rinse, 15 mL, Mouth Rinse, PRN, Danford, Lonni SQUIBB, MD   pantoprazole  (PROTONIX ) EC tablet 40 mg, 40 mg, Oral, QHS, Drusilla, Gagan S, MD, 40 mg at 04/19/24 2211   phenol (CHLORASEPTIC) mouth spray 1 spray, 1 spray, Mouth/Throat, PRN, Claudene Toribio BROCKS, MD, 1 spray at 04/13/24 1706   polyethylene glycol (MIRALAX  / GLYCOLAX ) packet 17 g, 17 g, Oral, Daily, Samtani, Jai-Gurmukh, MD, 17 g at 04/19/24 0856   promethazine  (PHENERGAN ) tablet 12.5-25 mg, 12.5-25 mg, Oral, Q4H PRN, Claudene Toribio BROCKS, MD   rosuvastatin  (CRESTOR ) tablet 20 mg, 20 mg, Oral, QHS, Claudene Toribio BROCKS, MD, 20 mg at 04/19/24 2211   senna-docusate (Senokot-S) tablet 1 tablet, 1 tablet, Oral, QHS, Claudene Toribio BROCKS, MD, 1 tablet at 04/19/24 2217    Patients Current Diet:  Diet Order                  Diet regular Room service appropriate? Yes with Assist; Fluid consistency: Thin  Diet effective now                        Precautions / Restrictions Precautions Precautions: Fall, Other (comment) Precaution/Restrictions Comments: Rt weakness, Rt hearing loss, Rt gaze Other Brace: resting hand splint for night time use R UE Restrictions Weight Bearing Restrictions Per Provider Order: No    Has  the patient had 2 or more falls or a fall with injury in the past year? 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 Independent   Indoor Mobility: Did the patient need assistance with walking from room to room (with or without device)? Independent   Stairs: Did the patient need assistance with internal or external stairs (with or without device)? Independent   Functional Cognition: Did the patient need help planning regular tasks such as shopping or remembering to take medications? Independent   Patient Information Are you of Hispanic, Latino/a,or Spanish origin?: A. No, not of Hispanic, Latino/a, or Spanish origin What is your race?: B. Black or African American Do you need or want an interpreter to communicate with a doctor or health care staff?: 0. No   Patient's Response To:  Health Literacy and Transportation Is the patient able to respond to health literacy and transportation needs?: Yes Health Literacy - How often do you need to have someone help you when you read instructions, pamphlets, or other written material from your doctor or pharmacy?: Never In the past 12 months, has lack of transportation kept you from medical appointments or from getting medications?: No In the past 12 months, has lack of transportation kept you from meetings, work, or from getting things needed for daily living?: No   Home Assistive Devices / Equipment Home Equipment: None   Prior Device Use: Indicate devices/aids used by the patient prior to current  illness, exacerbation or injury? None of the above   Current Functional Level Cognition   Orientation Level: Oriented X4    Extremity Assessment (includes Sensation/Coordination)   Upper Extremity Assessment: Right hand dominant, RUE deficits/detail RUE Deficits / Details: grossly 3-/5, poor shoulder flexion but able to abduct with increased effort.  some increased flexor tone.  Difficutly using UE functionally with poor sensation and coordination. using UE as a stabilizer RUE Sensation: decreased light touch, decreased proprioception RUE Coordination: decreased fine motor, decreased gross motor  Lower Extremity Assessment: Defer to PT evaluation RLE Deficits / Details: grossly 3/5, impaired coordination/ataxia, 75% sensory deficit RLE Sensation: decreased light touch, decreased proprioception RLE Coordination: decreased gross motor     ADLs   Overall ADL's : Needs assistance/impaired Eating/Feeding: Minimal assistance, Sitting Grooming: Contact guard assist, Wash/dry face, Sitting Grooming Details (indicate cue type and reason): perched on stedy at sink Upper Body Bathing: Moderate assistance Lower Body Bathing: Moderate assistance Upper Body Dressing : Sitting, Moderate assistance Upper Body Dressing Details (indicate cue type and reason): and extra time Lower Body Dressing: Total assistance, +2 for physical assistance, Sit to/from stand Lower Body Dressing Details (indicate cue type and reason): assist for clothing mgmt, but standing with contact guard today in stedy Toilet Transfer: Total assistance, +2 for physical assistance Toilet Transfer Details (indicate cue type and reason): simulated using stedy to recliner Functional mobility during ADLs: Maximal assistance, +2 for physical assistance, Contact guard assist, Rolling walker (2 wheels), Cueing for sequencing, Cueing for safety (stedy) General ADL Comments: returned to check splint to R UE     Mobility   Overal bed  mobility: Needs Assistance Bed Mobility: Rolling, Sidelying to Sit Rolling: Mod assist Sidelying to sit: Mod assist Supine to sit: Contact guard Sit to supine: Mod assist General bed mobility comments: rolling towards L side and transitioning from sidelying to sitting.  Needing increased assist  compared to exiting on R side of bed     Transfers   Overall transfer level: Needs assistance Equipment used: Standard walker, Ambulation equipment used Transfers: Sit to/from Stand Sit to Stand: Max assist, +2 physical assistance, Contact guard assist Bed to/from chair/wheelchair/BSC transfer type:: Via Lift equipment Stand pivot transfers: Max assist, +2 physical assistance Transfer via Lift Equipment: Stedy General transfer comment: pt cueing to scoot to EOB prior to transfer, worked on pushing from bed and forward lean but with each transfer into standing pt pushing posteriorly to L and unable to maintain R hand on bed or RW.  Needing max assist +2 to fully power up and steady from EOB with RW.  Using stedy, pulling from front bar able to stand with contact guard.     Ambulation / Gait / Stairs / Wheelchair Mobility   Ambulation/Gait Ambulation/Gait assistance: Max assist, +2 safety/equipment Gait Distance (Feet): 3 Feet (x7 bouts of stepping anterior <> posterior 2-3x each bout while standing anterior to chair) Assistive device: Rolling walker (2 wheels), 1 person hand held assist Gait Pattern/deviations: Step-to pattern, Decreased stance time - right, Decreased step length - left, Decreased dorsiflexion - right, Ataxic, Decreased step length - right, Trunk flexed, Decreased weight shift to right, Decreased weight shift to left, Knees buckling General Gait Details: ACE wrap applied to R ankle to facilitate dorsiflexion and prevent inversion during standing mobility, removed end of session. Practiced anterior <> posterior stepping while standing anterior to recliner. Pt stepping anterior <>  posterior 2-3x each bout, x7 bouts total (x4 with RW, x3 with HHA). This was primarily with his L leg while R leg was provided verbal and tactile cues to extend during stance phase. Tactile cues provided for weight shifting bil and to extend hips and trunk for upright balance. x2 steps anterior <> posterior with R leg with pt needing maxA to lift and advance R leg each time. MaxA for balance each gait training bout. Gait velocity: dec Gait velocity interpretation: <1.31 ft/sec, indicative of household ambulator     Posture / Balance Dynamic Sitting Balance Sitting balance - Comments: statically contact guard. Balance Overall balance assessment: Needs assistance Sitting-balance support: Bilateral upper extremity supported, Feet supported Sitting balance-Leahy Scale: Fair Sitting balance - Comments: statically contact guard. Postural control: Posterior lean, Right lateral lean Standing balance support: Bilateral upper extremity supported, During functional activity Standing balance-Leahy Scale: Poor Standing balance comment: relies on external and UE support     Special considerations/life events  Fall precautions    Previous Home Environment  Living Arrangements: Spouse/significant other  Lives With: Spouse Available Help at Discharge:  (spouse and 3 dtrs can provide 24/7 assist) Type of Home: House Home Layout: Two level, Bed/bath upstairs Alternate Level Stairs-Rails: Right Alternate Level Stairs-Number of Steps: flight Home Access: Stairs to enter Entrance Stairs-Rails: Right Entrance Stairs-Number of Steps: 4 Bathroom Shower/Tub: Health Visitor: Handicapped height Bathroom Accessibility: Yes How Accessible: Accessible via walker Home Care Services: No Additional Comments: drives a tour bus   Discharge Living Setting Plans for Discharge Living Setting: Patient's home, House, Lives with (comment) (wife) Type of Home at Discharge: House Discharge Home Layout:  Two level, Bed/bath upstairs Alternate Level Stairs-Rails: Right Alternate Level Stairs-Number of Steps: flight Discharge Home Access: Stairs to enter Entrance Stairs-Rails: Right Entrance Stairs-Number of Steps: 4 Discharge Bathroom Shower/Tub: Walk-in shower Discharge Bathroom Toilet: Handicapped height Discharge Bathroom Accessibility: Yes How Accessible: Accessible via walker Does the patient have any problems obtaining your medications?:  No   Social/Family/Support Systems Patient Roles: Spouse (employee) Contact Information: wife, Larry Dawson Anticipated Caregiver: wife and 3 dtrs Anticipated Caregiver's Contact Information: see contacts Ability/Limitations of Caregiver: no limitations Caregiver Availability: 24/7 Discharge Plan Discussed with Primary Caregiver: Yes Is Caregiver In Agreement with Plan?: Yes Does Caregiver/Family have Issues with Lodging/Transportation while Pt is in Rehab?: No   Goals Patient/Family Goal for Rehab: supervision with PT, OT and SLP Expected length of stay: ELOS 14 to 16 days Pt/Family Agrees to Admission and willing to participate: Yes Program Orientation Provided & Reviewed with Pt/Caregiver Including Roles  & Responsibilities: Yes   Decrease burden of Care through IP rehab admission: n/a   Possible need for SNF placement upon discharge: not anticipated   Patient Condition: This patient's condition remains as documented in the consult dated 04/15/24, in which the Rehabilitation Physician determined and documented that the patient's condition is appropriate for intensive rehabilitative care in an inpatient rehabilitation facility. Will admit to inpatient rehab today.   Preadmission Screen Completed By:  Alison Heron Lot, RN MSN1/03/2025 12:26 PM ______________________________________________________________________   Discussed status with Dr. Carilyn on 04/20/2024. at now and received approval for admission today.   Admission Coordinator:   Alison Heron Lot, RN MSN time 12:26 PM/Date 04/20/2024     Assessment/Plan: Diagnosis: brainstem cavernoma Does the need for close, 24 hr/day Medical supervision in concert with the patient's rehab needs make it unreasonable for this patient to be served in a less intensive setting? Yes Co-Morbidities requiring supervision/potential complications: COPD,DM 2, HTN Due to bladder management, bowel management, safety, skin/wound care, disease management, medication administration, pain management, and patient education, does the patient require 24 hr/day rehab nursing? Yes Does the patient require coordinated care of a physician, rehab nurse, PT, OT, and SLP to address physical and functional deficits in the context of the above medical diagnosis(es)? Yes Addressing deficits in the following areas: balance, endurance, locomotion, strength, transferring, bowel/bladder control, bathing, dressing, feeding, grooming, toileting, cognition, speech, language, swallowing, and psychosocial support Can the patient actively participate in an intensive therapy program of at least 3 hrs of therapy 5 days a week? Yes The potential for patient to make measurable gains while on inpatient rehab is good Anticipated functional outcomes upon discharge from inpatient rehab: min assist PT, min assist OT, supervision SLP Estimated rehab length of stay to reach the above functional goals is: 14-16 d Anticipated discharge destination: Home 10. Overall Rehab/Functional Prognosis: good     MD Signature: Prentice CHARLENA Carilyn M.D. [facility] Fellow Am Acad of [employer] Med and Rehab Diplomate Am Board of Electrodiagnostic Med Fellow Am Board of Interventional Pain               Revision History  Date/Time User Provider Type Action  04/20/2024  1:10 PM Kirsteins, Prentice BRAVO, MD Physician Sign  04/20/2024 12:33 PM Yvone Delayne Tinnie SHAUNNA, CCC-SLP Rehab Admission Coordinator Share  04/20/2024 12:26 PM  Yvone Delayne Tinnie SHAUNNA, CCC-SLP Rehab Admission Coordinator Share  04/16/2024  5:10 PM Alison Heron MATSU, RN Rehab Admission Coordinator Share  04/16/2024 12:03 PM Alison Heron MATSU, RN Rehab Admission Coordinator Share   View Details Report      Routing History  "

## 2024-04-20 NOTE — H&P (Addendum)
 Physical Medicine and Rehabilitation Admission H&P        Chief Complaint  Patient presents with   Brain Stem Bleed  : HPI: Larry Dawson. 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  Per chart review patient lives with spouse.  Two-level home bed and bath upstairs 4 steps to enter.  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  Presented 04/01/2024 with increasing right sided weakness/numbness/ataxia and intermittent diplopia with slurring of speech and difficulty swallowing.  Patient also with issues of mild headache.  Denied any nausea or vomiting.  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  Admission chemistries unremarkable except potassium 3.1, glucose 116, urine drug screen negative.  Recent echocardiogram with ejection fraction of 50 to 55% no wall motion abnormalities.  Patient underwent posterior craniotomy with tumor resection 04/13/2024 per Dr. [name].  He was cleared to begin subcutaneous heparin  for DVT prophylaxis 04/14/2024.  Therapy evaluations completed due to patient's decreased functional mobility and right-sided weakness was admitted for a comprehensive rehab program.  Pt feels like RUE getting stronger but RLE is not, discussed with NS APP, UE exam has improved LE exam stable  Staff using Steadi Lift for transfers last week and today  Per pt, double vision improving  Review of Systems  Constitutional:  Negative for chills and fever.  HENT:  Negative for hearing loss.   Eyes:  Positive for double  vision.  Respiratory:  Negative for cough, shortness of breath and wheezing.   Cardiovascular:  Negative for chest pain, palpitations and leg swelling.  Gastrointestinal:  Positive for constipation. Negative for heartburn, nausea and vomiting.  Genitourinary:  Positive for urgency. Negative for dysuria, flank pain and hematuria.  Musculoskeletal:  Positive for myalgias.  Skin:  Negative for rash.  Neurological:  Positive for dizziness, speech change, weakness and headaches.       Ataxic gait  All other systems reviewed and are negative.      Past Medical History:  Diagnosis Date   Allergy     Anal fissure     COPD (chronic obstructive pulmonary disease) (HCC)      Albuterol  PRN.   Diabetes mellitus without complication (HCC)     Hydrocele     Hyperlipidemia     Hypertension     IBS (irritable bowel syndrome) 05/22/2013    per chart note-pt denied             Past Surgical History:  Procedure Laterality Date   APPLICATION OF CRANIAL NAVIGATION   04/13/2024    Procedure: COMPUTER-ASSISTED NAVIGATION, FOR CRANIAL PROCEDURE;  Surgeon: [name] Dino HERO, MD;  Location: [facility] OR;  Service: Neurosurgery;;   CRANIOTOMY Left 04/13/2024    Procedure: Left Posterior Fossa Craniotomy for ARTERIO-VENOUS MALFORMATION DURAL COMPLEX;  Surgeon: [name] Dino HERO, MD;  Location: [facility] OR;  Service: Neurosurgery;  Laterality: Left;   HERNIA REPAIR   prior to 2013   HYDROCELE EXCISION Bilateral 11/23/2013    Procedure: HYDROCELECTOMY ADULT;  Surgeon: Alm GORMAN Fragmin, MD;  Location: [facility];  Service: Urology;  Laterality: Bilateral;   VASECTOMY   prior to 2013  Family History  Problem Relation Age of Onset   Diabetes Father     Hypertension Father     Diabetes Brother     Hypertension Brother     Diabetes Brother     Diabetes Brother          Social History:  reports that he has quit smoking. His smoking use included cigarettes. He has never used smokeless tobacco. He  reports that he does not currently use alcohol . He reports that he does not use drugs. Allergies: [Allergies]  [Allergies]      Allergen Reactions   Ace Inhibitors Swelling      Suspected angioedema   Penicillins Anaphylaxis, Hives, Rash and Other (See Comments)      Throat swells   Latex Hives, Swelling and Rash         Medications Prior to Admission  Medication Sig Dispense Refill   Arginine 500 MG CAPS Take 1 capsule by mouth daily.       Ascorbic Acid (VITAMIN C PO) Take 1 tablet by mouth daily.       atenolol -chlorthalidone  (TENORETIC ) 50-25 MG tablet Take 1 tablet by mouth daily.       cetirizine  (ZYRTEC ) 10 MG tablet Take 1 tablet (10 mg total) by mouth daily. (Patient taking differently: Take 10 mg by mouth daily as needed for allergies.) 30 tablet 0   cholecalciferol (VITAMIN D) 1000 units tablet Take 1,000 Units by mouth daily.       glipiZIDE  (GLUCOTROL ) 5 MG tablet TAKE 1 TABLET BY MOUTH 2 TIMES A DAY BEFORE A MEAL 60 tablet 3   metFORMIN  (GLUCOPHAGE ) 500 MG tablet TAKE 1 TABLET BY MOUTH TWICE A DAY WITH A MEAL 180 tablet 3   Multiple Vitamins-Minerals (ZINC  PO) Take 1 tablet by mouth daily.       Omega-3 Fatty Acids (FISH OIL) 1000 MG CAPS Take 1 capsule by mouth daily.       omeprazole  (PRILOSEC) 20 MG capsule Take 20 mg by mouth daily.       sildenafil  (VIAGRA ) 100 MG tablet Take 0.5-1 tablets (50-100 mg total) by mouth daily as needed for erectile dysfunction. 5 tablet 11   tirzepatide  (MOUNJARO ) 5 MG/0.5ML Pen Inject 5 mg into the skin once a week. 6 mL 3   rosuvastatin  (CRESTOR ) 20 MG tablet Take 20 mg by mouth at bedtime. (Patient not taking: Reported on 04/01/2024)                  Home: Home Living Family/patient expects to be discharged to:: Private residence Living Arrangements: Spouse/significant other Available Help at Discharge:  (spouse and 3 dtrs can provide 24/7 assist) Type of Home: House Home Access: Stairs to enter [employer] of  Steps: 4 Entrance Stairs-Rails: Right Home Layout: Two level, Bed/bath upstairs Alternate Level Stairs-Number of Steps: flight Alternate Level Stairs-Rails: Right Bathroom Shower/Tub: Health Visitor: Handicapped height Bathroom Accessibility: Yes Home Equipment: None Additional Comments: drives a tour bus  Lives With: Spouse   Functional History: Prior Function Prior Level of Function : Independent/Modified Independent, Working/employed, Driving Mobility Comments: indep, no AD, worked driving tour buses ADLs Comments: indep, working drives a tour bus   Functional Status:  Mobility: Bed Mobility Overal bed mobility: Needs Assistance Bed Mobility: Sit to Supine Rolling: Mod assist Sidelying to sit: Mod assist Supine to sit: Contact guard Sit to supine: Mod assist General bed mobility comments: Pt sitting in chair upon arrival and at end of session. Transfers Overall  transfer level: Needs assistance Equipment used: Rolling walker (2 wheels), 1 person hand held assist Transfers: Sit to/from Stand Sit to Stand: Mod assist Bed to/from chair/wheelchair/BSC transfer type:: Via Lift equipment Stand pivot transfers: Max assist, +2 physical assistance Transfer via Lift Equipment: Stedy General transfer comment: Cued pt to scoot to edge of seat prior to each transfer. Pt needed cues to shift weight laterally and scoot contralateral hip anteriorly to scoot forward. Needs cues to place R hand on RW and L hand on chair arm rest to push up to stand. Cues provided for pt to match R foot placement to his L as well. ACE wrap applied to R ankle to facilitate dorsiflexion and prevent inversion during standing mobility, removed end of session. R knee blocked each transfer. x7 reps total from recliner with modA each rep to power up to stand and gain balance. x2 with R lateral approach/assist with RW, x2 with anterior approach/assist with RW, x3 with anterior approach with  HHA Ambulation/Gait Ambulation/Gait assistance: Max assist, +2 safety/equipment Gait Distance (Feet): 3 Feet (x7 bouts of stepping anterior <> posterior 2-3x each bout while standing anterior to chair) Assistive device: Rolling walker (2 wheels), 1 person hand held assist Gait Pattern/deviations: Step-to pattern, Decreased stance time - right, Decreased step length - left, Decreased dorsiflexion - right, Ataxic, Decreased step length - right, Trunk flexed, Decreased weight shift to right, Decreased weight shift to left, Knees buckling General Gait Details: ACE wrap applied to R ankle to facilitate dorsiflexion and prevent inversion during standing mobility, removed end of session. Practiced anterior <> posterior stepping while standing anterior to recliner. Pt stepping anterior <> posterior 2-3x each bout, x7 bouts total (x4 with RW, x3 with HHA). This was primarily with his L leg while R leg was provided verbal and tactile cues to extend during stance phase. Tactile cues provided for weight shifting bil and to extend hips and trunk for upright balance. x2 steps anterior <> posterior with R leg with pt needing maxA to lift and advance R leg each time. MaxA for balance each gait training bout. Gait velocity: dec Gait velocity interpretation: <1.31 ft/sec, indicative of household ambulator   ADL: ADL Overall ADL's : Needs assistance/impaired Eating/Feeding: Minimal assistance, Sitting Grooming: Contact guard assist, Sitting Grooming Details (indicate cue type and reason): perched on stedy at sink Upper Body Bathing: Moderate assistance Lower Body Bathing: Moderate assistance Upper Body Dressing : Sitting, Moderate assistance Upper Body Dressing Details (indicate cue type and reason): and extra time Lower Body Dressing: +2 for physical assistance, Total assistance, Sit to/from stand Lower Body Dressing Details (indicate cue type and reason): assist for clothing mgmt, but standing with contact guard  today in stedy Toilet Transfer: Maximal assistance, +2 for safety/equipment, Stand-pivot, Rolling walker (2 wheels) Toilet Transfer Details (indicate cue type and reason): using stedy Functional mobility during ADLs: Contact guard assist (stedy) General ADL Comments: returned to check splint to R UE   Cognition: Cognition Orientation Level: Oriented X4 Cognition Arousal: Alert Behavior During Therapy: WFL for tasks assessed/performed   Physical Exam: Blood pressure 137/72, pulse 69, temperature 98.4 F (36.9 C), temperature source Oral, resp. rate 19, height 5' 7 (1.702 m), weight 102.2 kg, SpO2 (!) 89%. Physical Exam HENT:     Head: Left suboccipital incision staples intact , non tender    Comments: Surgical site clean and dry with dressing Neurological:     Comments: Patient was alert.  He did display some mild dysarthria.  Follows simple commands.  Provides name and age.   General: No acute distress Mood and affect are appropriate Heart: Regular rate and rhythm no rubs murmurs or extra sounds Lungs: Clear to auscultation, breathing unlabored, no rales or wheezes Abdomen: Positive bowel sounds, soft nontender to palpation, nondistended Extremities: No clubbing, cyanosis, or edema Skin: No evidence of breakdown, no evidence of rash Neurologic: Cranial nerves II through XII intact, motor strength is 5/5 in LEFT and 3- Right deltoid, bicep, tricep, grip,5/5 Left and 3- Right  hip flexor,3/5 Right and 5/5 Left knee extensors, ankle dorsiflexor and plantar flexor Sensory exam normal sensation to light touch and proprioception in bilateral upper and lower extremities Reduced sensation Left hemifacial  Cerebellar exam normal finger to nose to finger as well as heel to shin in LEFT  upper and lower extremities, right side limited by strength Musculoskeletal: Full range of motion in all 4 extremities. No joint swelling      Lab Results Last 48 Hours        Results for orders placed  or performed during the hospital encounter of 04/01/24 (from the past 48 hours)  Glucose, capillary     Status: None    Collection Time: 04/18/24  7:41 AM  Result Value Ref Range    Glucose-Capillary 98 70 - 99 mg/dL      Comment: Glucose reference range applies only to samples taken after fasting for at least 8 hours.  Glucose, capillary     Status: Abnormal    Collection Time: 04/18/24 11:29 AM  Result Value Ref Range    Glucose-Capillary 141 (H) 70 - 99 mg/dL      Comment: Glucose reference range applies only to samples taken after fasting for at least 8 hours.  Glucose, capillary     Status: None    Collection Time: 04/18/24  6:24 PM  Result Value Ref Range    Glucose-Capillary 88 70 - 99 mg/dL      Comment: Glucose reference range applies only to samples taken after fasting for at least 8 hours.  Glucose, capillary     Status: None    Collection Time: 04/18/24  9:28 PM  Result Value Ref Range    Glucose-Capillary 86 70 - 99 mg/dL      Comment: Glucose reference range applies only to samples taken after fasting for at least 8 hours.  Glucose, capillary     Status: Abnormal    Collection Time: 04/19/24  7:25 AM  Result Value Ref Range    Glucose-Capillary 107 (H) 70 - 99 mg/dL      Comment: Glucose reference range applies only to samples taken after fasting for at least 8 hours.  Glucose, capillary     Status: Abnormal    Collection Time: 04/19/24 12:11 PM  Result Value Ref Range    Glucose-Capillary 115 (H) 70 - 99 mg/dL      Comment: Glucose reference range applies only to samples taken after fasting for at least 8 hours.  Glucose, capillary     Status: None    Collection Time: 04/19/24  5:17 PM  Result Value Ref Range    Glucose-Capillary 86 70 - 99 mg/dL      Comment: Glucose reference range applies only to samples taken after fasting for at least 8 hours.  Glucose, capillary     Status: Abnormal    Collection Time: 04/19/24  9:43 PM  Result Value Ref Range     Glucose-Capillary 131 (H) 70 - 99 mg/dL  Comment: Glucose reference range applies only to samples taken after fasting for at least 8 hours.      Imaging Results (Last 48 hours)  DG Ankle Complete Right Result Date: 04/18/2024 EXAM: 3 OR MORE VIEW(S) XRAY OF THE ANKLE 04/18/2024 04:45:00 PM CLINICAL HISTORY: Ankle pain COMPARISON: None available. FINDINGS: BONES AND JOINTS: No acute fracture. No malalignment. SOFT TISSUES: Ossific density along the course of the Achilles tendon. IMPRESSION: 1. Mild calcific tendinitis of the achilles tendon. Electronically signed by: Dorethia Molt MD MD 04/18/2024 08:11 PM EST RP Workstation: [id number]            Blood pressure 137/72, pulse 69, temperature 98.4 F (36.9 C), temperature source Oral, resp. rate 19, height 5' 7 (1.702 m), weight 102.2 kg, SpO2 (!) 89%.   Medical Problem List and Plan: 1. 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 Janjua             -patient may  shower with shower cap             -ELOS/Goals: 14-16d 2.  Antithrombotics: -DVT/anticoagulation:  Pharmaceutical: Heparin  initiated 04/14/2024             -antiplatelet therapy: N/A 3. Pain Management: Tylenol  as needed 4. Mood/Behavior/Sleep: Provide emotional support             -antipsychotic agents: N/A 5. Neuropsych/cognition: This patient is capable of making decisions on his own behalf. 6. Skin/Wound Care: Routine skin checks 7. Fluids/Electrolytes/Nutrition: Routine and analysis with follow-up chemistries 8.  Diabetes mellitus.  Hemoglobin A1c 7.2.  Glucophage  500 mg twice daily, Lantus  insulin  8 units nightly CBG (last 3)  Recent Labs    04/19/24 2143 04/20/24 0810 04/20/24 1122  GLUCAP 131* 125* 129*  Control is good  9.  Hyperlipidemia.  Crestor  10.  Hypertension.  Norvasc  5 mg daily, Tenormin  50 mg daily, monitor with increased mobility Last vitals were in good range 11.  Class  II obesity.  BMI 35.29.  Dietary follow-up 12.  Constipation/IBS.  MiraLAX  daily, Senokot 1 tablet nightly 13.  COPD.  History of tobacco use.  Monitor oxygen saturations.  Discussed no nicotine.   Toribio PARAS Angiulli, PA-C 04/20/2024 I have personally performed a face to face diagnostic evaluation of this patient.  Additionally, I have reviewed and concur with the physician assistant's documentation above. Prentice CHARLENA Compton M.D. [facility] Fellow Am Acad of Phys Med and Rehab Diplomate Am Board of Electrodiagnostic Med Fellow Am Board of Interventional Pain

## 2024-04-20 NOTE — Progress Notes (Addendum)
 Inpatient Rehab Admissions Coordinator:  Awaiting insurance authorization. Will continue to follow.  Insurance authorization received. There is a bed available in CIR today. Dr. Cherlyn aware and in agreement. Pt, NSG and TOC made aware.   Tinnie Yvone Cohens, MS, CCC-SLP Admissions Coordinator [phone]

## 2024-04-20 NOTE — Progress Notes (Signed)
 Larry Dawson

## 2024-04-21 DIAGNOSIS — D1800 Hemangioma unspecified site: Secondary | ICD-10-CM | POA: Diagnosis not present

## 2024-04-21 LAB — GLUCOSE, CAPILLARY
Glucose-Capillary: 105 mg/dL — ABNORMAL HIGH (ref 70–99)
Glucose-Capillary: 118 mg/dL — ABNORMAL HIGH (ref 70–99)
Glucose-Capillary: 95 mg/dL (ref 70–99)
Glucose-Capillary: 114 mg/dL — ABNORMAL HIGH (ref 70–99)

## 2024-04-21 LAB — CBC WITH DIFFERENTIAL/PLATELET
Abs Immature Granulocytes: 0.03 K/uL (ref 0.00–0.07)
Basophils Absolute: 0 K/uL (ref 0.0–0.1)
Basophils Relative: 1 %
Eosinophils Absolute: 0.1 K/uL (ref 0.0–0.5)
Eosinophils Relative: 2 %
HCT: 38.2 % — ABNORMAL LOW (ref 39.0–52.0)
Hemoglobin: 13.1 g/dL (ref 13.0–17.0)
Immature Granulocytes: 0 %
Lymphocytes Relative: 27 %
Lymphs Abs: 1.8 K/uL (ref 0.7–4.0)
MCH: 28.4 pg (ref 26.0–34.0)
MCHC: 34.3 g/dL (ref 30.0–36.0)
MCV: 82.9 fL (ref 80.0–100.0)
Monocytes Absolute: 0.7 K/uL (ref 0.1–1.0)
Monocytes Relative: 10 %
Neutro Abs: 4.2 K/uL (ref 1.7–7.7)
Neutrophils Relative %: 60 %
Platelets: 277 K/uL (ref 150–400)
RBC: 4.61 MIL/uL (ref 4.22–5.81)
RDW: 12.8 % (ref 11.5–15.5)
WBC: 6.9 K/uL (ref 4.0–10.5)
nRBC: 0 % (ref 0.0–0.2)

## 2024-04-21 LAB — COMPREHENSIVE METABOLIC PANEL WITH GFR
ALT: 48 U/L — ABNORMAL HIGH (ref 0–44)
AST: 31 U/L (ref 15–41)
Albumin: 3.5 g/dL (ref 3.5–5.0)
Alkaline Phosphatase: 62 U/L (ref 38–126)
Anion gap: 10 (ref 5–15)
BUN: 14 mg/dL (ref 8–23)
CO2: 24 mmol/L (ref 22–32)
Calcium: 9.3 mg/dL (ref 8.9–10.3)
Chloride: 104 mmol/L (ref 98–111)
Creatinine, Ser: 0.97 mg/dL (ref 0.61–1.24)
GFR, Estimated: >60 mL/min
Glucose, Bld: 97 mg/dL (ref 70–99)
Potassium: 3.7 mmol/L (ref 3.5–5.1)
Sodium: 138 mmol/L (ref 135–145)
Total Bilirubin: 1.2 mg/dL (ref 0.0–1.2)
Total Protein: 6.6 g/dL (ref 6.5–8.1)

## 2024-04-21 NOTE — Evaluation (Signed)
 Speech Language Pathology Assessment and Plan  Patient Details  Name: Larry Dawson MRN: [id number] Date of Birth: 29-Jun-1955  SLP Diagnosis: Dysarthria;Cognitive Impairments  Rehab Potential: Good ELOS: 2-2.5 weeks    Today's Date: 04/21/2024 SLP Individual Time: 1100-1200 SLP Individual Time Calculation (min): 60 min   Hospital Problem: Principal Problem:   Cavernoma  Past Medical History:  Past Medical History:  Diagnosis Date   Allergy    Anal fissure    COPD (chronic obstructive pulmonary disease) (HCC)    Albuterol  PRN.   Diabetes mellitus without complication (HCC)    Hydrocele    Hyperlipidemia    Hypertension    IBS (irritable bowel syndrome) 05/22/2013   per chart note-pt denied   Past Surgical History:  Past Surgical History:  Procedure Laterality Date   APPLICATION OF CRANIAL NAVIGATION  04/13/2024   Procedure: COMPUTER-ASSISTED NAVIGATION, FOR CRANIAL PROCEDURE;  Surgeon: Larry Dino HERO, MD;  Location: [facility] OR;  Service: Neurosurgery;;   CRANIOTOMY Left 04/13/2024   Procedure: Left Posterior Fossa Craniotomy for ARTERIO-VENOUS MALFORMATION DURAL COMPLEX;  Surgeon: Larry Dino HERO, MD;  Location: [facility] OR;  Service: Neurosurgery;  Laterality: Left;   HERNIA REPAIR  prior to 2013   HYDROCELE EXCISION Bilateral 11/23/2013   Procedure: HYDROCELECTOMY ADULT;  Surgeon: Larry GORMAN Fragmin, MD;  Location: [facility];  Service: Urology;  Laterality: Bilateral;   VASECTOMY  prior to 2013    Assessment / Plan / Recommendation Clinical Impression  Larry Dawson is a 69 year old right-handed male with history significant for COPD/tobacco use, hypertension, hyperlipidemia, diabetes mellitus, IBS, as well as recent admission 12/8 - 12/10 for feeling lightheaded/off-balance was 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 Per chart review patient lives with spouse.  Two-level home bed and bath upstairs 4 steps to enter. 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 Presented 04/01/2024 with increasing right sided weakness/numbness/ataxia and intermittent diplopia with slurring of speech and difficulty swallowing. 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 Patient underwent posterior craniotomy with tumor resection 04/13/2024 per Dr. [name].   Cognitive-Linguistic Evaluation: Patient presents with trace to mild deficits in the areas of motor speech, selective and higher level attention, and complex problem solving. SLP administered RBANS evaluation and patient scored WFL in the areas of language and immediate and delayed memory. He scored in below average range for visuospatial/constructional skills as well as attention. Patient endorses potential trace changes to cognition compared to baseline but provided detailed and accurate retelling of family dynamics and events leading up to hospitalization. Patient's motor speech is characterized by mildly imprecise articulation at the conversation level but was otherwise [facility]. No receptive/expressive language deficits noted. Patient currently works as a pharmacologist and endorses that his long term goal would be to return to work. Patient would benefit from SLP services targeting aforementioned deficits.  Bedside Swallow Evaluation: Patient presents with clinical swallowing function that appears mildly impaired compared to patient's described baseline function. Patient endorses changes in mandibular strength characterized by jaw deviation to the L (R sided weakness) which results in prolonged mastication. Oral mechanism exam was positive for this deviation as well as R lingual weakness and reduced cough strength. SLP administered regular solids and thin liquids. No overt s/sx of  penetration/aspiration noted across all consistencies. Recommend continuation of regular/thin liquid diet with  medications administered whole with thin liquids. SLP will continue to follow briefly to address ongoing tolerance of regular/thin liquid diet.  Patient was left in chair with call bell in reach and chair alarm set. SLP will continue to target goals per plan of care.     Skilled Therapeutic Interventions          SLP conducted skilled evaluation session to assess cognitive-linguistic and swallowing function. Utilized RBANS, bedside swallow evaluation, and patient and/or family interview. Full results above.   SLP Assessment  Patient will need skilled Speech Lanaguage Pathology Services during CIR admission    Recommendations  SLP Diet Recommendations: Age appropriate regular solids;Thin Liquid Administration via: Cup;Straw Medication Administration: Whole meds with liquid Supervision: Patient able to self feed Compensations: Slow rate Postural Changes and/or Swallow Maneuvers: Seated upright 90 degrees Oral Care Recommendations: Oral care BID Recommendations for Other Services: Neuropsych consult Patient destination: Home Follow up Recommendations: Outpatient SLP Equipment Recommended: None recommended by SLP    SLP Frequency 1 to 3 out of 7 days   SLP Duration  SLP Intensity  SLP Treatment/Interventions 2-2.5 weeks  Minumum of 1-2 x/day, 30 to 90 minutes  Cognitive remediation/compensation;Speech/Language facilitation;Cueing hierarchy;Functional tasks;Therapeutic Activities;Internal/external aids;Patient/family education    Pain  None endorsed  Prior Functioning Cognitive/Linguistic Baseline: Within functional limits Type of Home: House  Lives With: Spouse Available Help at Discharge: Family;Available 24 hours/day Vocation: Part time employment  SLP Evaluation Cognition Overall Cognitive Status: Impaired/Different from baseline Arousal/Alertness:  Awake/alert Orientation Level: Oriented X4 Year: 2026 Month: January Day of Week: Correct Attention: Selective Selective Attention: Impaired Selective Attention Impairment: Verbal complex;Functional complex Memory: Appears intact Awareness: Appears intact Problem Solving: Impaired Problem Solving Impairment: Verbal complex;Functional complex Safety/Judgment: Appears intact  Comprehension Auditory Comprehension Overall Auditory Comprehension: Appears within functional limits for tasks assessed Expression Expression Primary Mode of Expression: Verbal Verbal Expression Overall Verbal Expression: Appears within functional limits for tasks assessed Oral Motor Oral Motor/Sensory Function Overall Oral Motor/Sensory Function: Mild impairment Facial ROM: Reduced right;Suspected CN VII (facial) dysfunction Facial Symmetry: Abnormal symmetry right;Suspected CN VII (facial) dysfunction Facial Strength: Within Functional Limits Facial Sensation: Within Functional Limits Lingual ROM: Within Functional Limits Lingual Symmetry: Within Functional Limits Lingual Strength: Reduced Lingual Sensation: Within Functional Limits Velum: Within Functional Limits Mandible: Impaired Motor Speech Overall Motor Speech: Impaired Respiration: Within functional limits Phonation: Normal Resonance: Within functional limits Articulation: Impaired Level of Impairment: Conversation Intelligibility: Intelligible Motor Planning: Within functional limits Motor Speech Errors: Aware Effective Techniques: Over-articulate;Pacing  Care Tool Care Tool Cognition Ability to hear (with hearing aid or hearing appliances if normally used Ability to hear (with hearing aid or hearing appliances if normally used): 1. Minimal difficulty - difficulty in some environments (e.g. when person speaks softly or setting is noisy)   Expression of Ideas and Wants Expression of Ideas and Wants: 4. Without difficulty (complex and  basic) - expresses complex messages without difficulty and with speech that is clear and easy to understand   Understanding Verbal and Non-Verbal Content Understanding Verbal and Non-Verbal Content: 3. Usually understands - understands most conversations, but misses some part/intent of message. Requires cues at times to understand  Memory/Recall Ability Memory/Recall Ability : That he or she is in a hospital/hospital unit;Current season   Intelligibility: Intelligible  Bedside Swallowing Assessment General Date of Onset: 03/16/25 Previous Swallow Assessment: MBS 12/26 - regular/thin Diet Prior to this Study: Regular;Thin liquids (Level 0) Temperature Spikes Noted: No Respiratory Status: Room air History of Recent Intubation: No Total  duration of intubation (days): 1 days Date extubated: 04/13/24 Behavior/Cognition: Alert;Cooperative;Pleasant mood Oral Cavity - Dentition: Adequate natural dentition Self-Feeding Abilities: Needs assist Vision: Impaired for self-feeding Patient Positioning: Upright in chair/Tumbleform Baseline Vocal Quality: Normal Volitional Cough: Weak Volitional Swallow: Able to elicit  Oral Care Assessment Oral Assessment  (WDL): Exceptions to WDL Lips: Asymmetrical Teeth: Missing (Comment) Tongue: Pink;Moist Mucous Membrane(s): Pink;Moist Saliva: Moist, saliva free flowing Level of Consciousness: Alert Is patient on any of following O2 devices?: None of the above Nutritional status: No high risk factors Oral Assessment Risk : Low Risk Ice Chips Ice chips: Not tested Thin Liquid Thin Liquid: Within functional limits Presentation: Straw Nectar Thick Nectar Thick Liquid: Not tested Honey Thick Honey Thick Liquid: Not tested Puree Puree: Not tested Solid Solid: Impaired Presentation: Self Fed Oral Phase Impairments: Impaired mastication Oral Phase Functional Implications: Prolonged oral transit BSE Assessment Risk for Aspiration Impact on safety  and function: No limitations  Short Term Goals: Week 1: SLP Short Term Goal 1 (Week 1): Patient will selectively attend to therapy tasks for 20 minutes given supervision assist. SLP Short Term Goal 2 (Week 1): Patient will solve complex functional problems with supervision assist. SLP Short Term Goal 3 (Week 1): Patient will utilize SLOP speech intelligibility strategies at the conversation level with supervision assist. SLP Short Term Goal 4 (Week 1): Patient will tolerate regular/thin liquid diet with supervision assist for use of safe swallowing strategies.  Refer to Care Plan for Long Term Goals  Recommendations for other services: Neuropsych  Discharge Criteria: Patient will be discharged from SLP if patient refuses treatment 3 consecutive times without medical reason, if treatment goals not met, if there is a change in medical status, if patient makes no progress towards goals or if patient is discharged from hospital.  The above assessment, treatment plan, treatment alternatives and goals were discussed and mutually agreed upon: by patient  Dorissa Stinnette A Taim Wurm 04/21/2024, 3:13 PM

## 2024-04-21 NOTE — Progress Notes (Signed)
 "                                                        PROGRESS NOTE   Subjective/Complaints:  No events overnight. No acute complaints.  Slept well overnight, ate well this a.m., participating well in therapies without discomfort.  Does have some left-sided headache at the surgical site when he lays on that side of his head, but otherwise no complaints. Vitals stable      04/21/2024    4:17 AM 04/20/2024    8:04 PM 04/20/2024    3:29 PM  Vitals with BMI  Height   5' 7  Weight   211 lbs  BMI   33.04  Systolic 125 127 875  Diastolic 62 68 67  Pulse 68 71 73    Recent Labs    04/20/24 1633 04/20/24 2100 04/21/24 0553  GLUCAP 91 93 105*    Blood sugars tightly controlled AM labs significant for mildly elevated ALT, otherwise stable P.o. intakes appropriate, 30 to 40% of meals consistently Continent of bladder   Last BM 1-12  ROS: Denies fevers, chills, N/V, abdominal pain, constipation, diarrhea, SOB, cough, chest pain, new weakness or paraesthesias.    Objective:   No results found. Recent Labs    04/20/24 1449 04/21/24 0456  WBC 9.9 6.9  HGB 13.8 13.1  HCT 41.6 38.2*  PLT 301 277   Recent Labs    04/20/24 1449 04/21/24 0456  NA  --  138  K  --  3.7  CL  --  104  CO2  --  24  GLUCOSE  --  97  BUN  --  14  CREATININE 1.12 0.97  CALCIUM   --  9.3    Intake/Output Summary (Last 24 hours) at 04/21/2024 0950 Last data filed at 04/21/2024 0700 Gross per 24 hour  Intake 240 ml  Output 300 ml  Net -60 ml        Physical Exam: Vital Signs Blood pressure 125/62, pulse 68, temperature 97.8 F (36.6 C), temperature source Oral, resp. rate 18, height 5' 7 (1.702 m), weight 95.7 kg, SpO2 97%.  Physical Exam HENT:     Head: Left suboccipital incision staples intact , non tender    Comments: Surgical site clean and dry with dressing Neurological:     Comments: Patient was alert.  He did display some mild dysarthria.  Follows simple commands.  Provides  name and age.    General: No acute distress Mood and affect are appropriate Heart: Regular rate and rhythm no rubs murmurs or extra sounds Lungs: Clear to auscultation, breathing unlabored, no rales or wheezes Abdomen: Positive bowel sounds, soft nontender to palpation, nondistended Extremities: No clubbing, cyanosis, or edema Skin: No evidence of breakdown, no evidence of rash Neurologic: Cranial nerves II through XII intact, motor strength is 5/5 in LEFT and 3- Right deltoid, bicep, tricep, grip,5/5 Left and 3- Right  hip flexor,3/5 Right and 5/5 Left knee extensors, ankle dorsiflexor and plantar flexor Sensory exam normal sensation to light touch and proprioception in bilateral upper and lower extremities Reduced sensation Left hemifacial  Cerebellar exam normal finger to nose to finger as well as heel to shin in LEFT  upper and lower extremities, right side limited by strength Musculoskeletal: Full range of motion in all 4  extremities. No joint swelling  Constitutional: No apparent distress. Appropriate appearance for age.  Sitting upright in bedside chair. HENT: Status post left basal craniotomy, site with minimal edema. Eyes: PERRLA.  Heavy nystagmus with left greater than rightward gaze, left glasses taped to improve diplopia. Cardiovascular: RRR, no murmurs/rub/gallops. No Edema. Peripheral pulses 2+  Respiratory: CTAB. No rales, rhonchi, or wheezing. On RA.  Abdomen: + bowel sounds, normoactive. No distention or tenderness.  Skin: Surgical site with staples intact, no apparent drainage, clean, dry.  MSK:      No apparent deformity.       Neurologic exam:  Cognition: AAO to person, place, time and event.  Language: Fluent, No substitutions or neoglisms. No dysarthria. Names 3/3 objects correctly.  Memory: Recalls 3/3 objects at 5 minutes. No apparent deficits  Insight: Good  insight into current condition.  Mood: Pleasant affect, appropriate mood.  Sensation: Reduced to light  touch throughout the right hemibody Reflexes: 2+ in BL UE and LEs. Negative Hoffman's and babinski signs bilaterally.  CN: Left sided vision deficits.  Otherwise, intact. Coordination: Upper and lower extremity ataxia. Spasticity: MAS 0 in all extremities.       Strength:                RUE: 3/5 SA, 3/5 EF, 3/5 EE, 4/5 WE, 4/5 FF, 4/5 FA                LUE:  5/5 SA, 5/5 EF, 5/5 EE, 5/5 WE, 5/5 FF, 5/5 FA                RLE: 3/5 HF, 3/5 KE, 0/5  DF, 0/5  EHL, 4/5  PF                 LLE:  5/5 HF, 5/5 KE, 5/5  DF, 5/5  EHL, 5/5  PF      Assessment/Plan: 1. Functional deficits which require 3+ hours per day of interdisciplinary therapy in a comprehensive inpatient rehab setting. Physiatrist is providing close team supervision and 24 hour management of active medical problems listed below. Physiatrist and rehab team continue to assess barriers to discharge/monitor patient progress toward functional and medical goals  Care Tool:  Bathing              Bathing assist       Upper Body Dressing/Undressing Upper body dressing        Upper body assist      Lower Body Dressing/Undressing Lower body dressing            Lower body assist       Toileting Toileting    Toileting assist       Transfers Chair/bed transfer  Transfers assist           Locomotion Ambulation   Ambulation assist              Walk 10 feet activity   Assist           Walk 50 feet activity   Assist           Walk 150 feet activity   Assist           Walk 10 feet on uneven surface  activity   Assist           Wheelchair     Assist               Wheelchair 50 feet with 2 turns activity  Assist            Wheelchair 150 feet activity     Assist          Blood pressure 125/62, pulse 68, temperature 97.8 F (36.6 C), temperature source Oral, resp. rate 18, height 5' 7 (1.702 m), weight 95.7 kg, SpO2 97%.    Medical  Problem List and Plan: 1. 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 Janjua             -patient may  shower with shower cap             -ELOS/Goals: 14-16d -CGA to Min A goals - pending DC date  - Stable to continue inpatient rehab   - 1/13: Going home with 2 daughters, one is CNA. Min to Mod A UB and Max A LB ADLs; poor body orientation. Other evals pending.   2.  Antithrombotics: -DVT/anticoagulation:  Pharmaceutical: Heparin  initiated 04/14/2024             -antiplatelet therapy: N/A 3. Pain Management: Tylenol  as needed   -1-13 discussed positioning off of surgical site at nighttime.  Pain well-controlled on current regimen.  4. Mood/Behavior/Sleep: Provide emotional support             -antipsychotic agents: N/A 5. Neuropsych/cognition: This patient is capable of making decisions on his own behalf. 6. Skin/Wound Care: Routine skin checks 7. Fluids/Electrolytes/Nutrition: Routine and analysis with follow-up chemistries 8.  Diabetes mellitus.  Hemoglobin A1c 7.2.  Glucophage  500 mg twice daily, Lantus  insulin  8 units nightly Recent Labs    04/20/24 1633 04/20/24 2100 04/21/24 0553  GLUCAP 91 93 105*    -Blood sugars tightly controlled   9.  Hyperlipidemia.  Crestor  10.  Hypertension.  Norvasc  5 mg daily, Tenormin  50 mg daily, monitor with increased mobility -Blood pressure, vital stable.    04/21/2024    4:17 AM 04/20/2024    8:04 PM 04/20/2024    3:29 PM  Vitals with BMI  Height   5' 7  Weight   211 lbs  BMI   33.04  Systolic 125 127 875  Diastolic 62 68 67  Pulse 68 71 73    11.  Class II obesity.  BMI 35.29.  Dietary follow-up 12.  Constipation/IBS.  MiraLAX  daily, Senokot 1 tablet nightly   - 1/12 LBM  13.  COPD.  History of tobacco use.  Monitor oxygen saturations.  Discussed no nicotine. 14. Urinary urgency. Wean off purwick.   LOS: 1 days A FACE TO FACE EVALUATION WAS  PERFORMED  Joesph JAYSON Likes 04/21/2024, 9:50 AM     "

## 2024-04-21 NOTE — Progress Notes (Signed)
 Report given to 4W nurse. Patient's belongings collected and returned to patient. Patient transported off unit in bed. Transported off unit by [facility].

## 2024-04-21 NOTE — Plan of Care (Signed)
" °  Problem: RH Balance Goal: LTG Patient will maintain dynamic sitting balance (PT) Description: LTG:  Patient will maintain dynamic sitting balance with assistance during mobility activities (PT) Flowsheets (Taken 04/21/2024 1754) LTG: Pt will maintain dynamic sitting balance during mobility activities with:: Independent with assistive device  Goal: LTG Patient will maintain dynamic standing balance (PT) Description: LTG:  Patient will maintain dynamic standing balance with assistance during mobility activities (PT) Flowsheets (Taken 04/21/2024 1754) LTG: Pt will maintain dynamic standing balance during mobility activities with:: Supervision/Verbal cueing   Problem: Sit to Stand Goal: LTG:  Patient will perform sit to stand with assistance level (PT) Description: LTG:  Patient will perform sit to stand with assistance level (PT) Flowsheets (Taken 04/21/2024 1754) LTG: PT will perform sit to stand in preparation for functional mobility with assistance level: Supervision/Verbal cueing   Problem: RH Bed Mobility Goal: LTG Patient will perform bed mobility with assist (PT) Description: LTG: Patient will perform bed mobility with assistance, with/without cues (PT). Flowsheets (Taken 04/21/2024 1754) LTG: Pt will perform bed mobility with assistance level of: Independent with assistive device    Problem: RH Bed to Chair Transfers Goal: LTG Patient will perform bed/chair transfers w/assist (PT) Description: LTG: Patient will perform bed to chair transfers with assistance (PT). Flowsheets (Taken 04/21/2024 1754) LTG: Pt will perform Bed to Chair Transfers with assistance level: Supervision/Verbal cueing   Problem: RH Car Transfers Goal: LTG Patient will perform car transfers with assist (PT) Description: LTG: Patient will perform car transfers with assistance (PT). Flowsheets (Taken 04/21/2024 1754) LTG: Pt will perform car transfers with assist:: Supervision/Verbal cueing   Problem: RH  Ambulation Goal: LTG Patient will ambulate in controlled environment (PT) Description: LTG: Patient will ambulate in a controlled environment, # of feet with assistance (PT). Flowsheets (Taken 04/21/2024 1754) LTG: Pt will ambulate in controlled environ  assist needed:: Supervision/Verbal cueing LTG: Ambulation distance in controlled environment: 150' Goal: LTG Patient will ambulate in home environment (PT) Description: LTG: Patient will ambulate in home environment, # of feet with assistance (PT). Flowsheets (Taken 04/21/2024 1754) LTG: Pt will ambulate in home environ  assist needed:: Supervision/Verbal cueing LTG: Ambulation distance in home environment: 50'   Problem: RH Stairs Goal: LTG Patient will ambulate up and down stairs w/assist (PT) Description: LTG: Patient will ambulate up and down # of stairs with assistance (PT) Flowsheets (Taken 04/21/2024 1754) LTG: Pt will ambulate up/down stairs assist needed:: Contact Guard/Touching assist LTG: Pt will  ambulate up and down number of stairs: 4 steps with LHR per home set up   "

## 2024-04-21 NOTE — Plan of Care (Signed)
" °  Problem: RH Balance Goal: LTG: Patient will maintain dynamic sitting balance (OT) Description: LTG:  Patient will maintain dynamic sitting balance with assistance during activities of daily living (OT) Flowsheets (Taken 04/21/2024 1610) LTG: Pt will maintain dynamic sitting balance during ADLs with: Supervision/Verbal cueing   Problem: Sit to Stand Goal: LTG:  Patient will perform sit to stand in prep for activites of daily living with assistance level (OT) Description: LTG:  Patient will perform sit to stand in prep for activites of daily living with assistance level (OT) Flowsheets (Taken 04/21/2024 1610) LTG: PT will perform sit to stand in prep for activites of daily living with assistance level: Minimal Assistance - Patient > 75%   Problem: RH Bathing Goal: LTG Patient will bathe all body parts with assist levels (OT) Description: LTG: Patient will bathe all body parts with assist levels (OT) Flowsheets (Taken 04/21/2024 1610) LTG: Pt will perform bathing with assistance level/cueing: Minimal Assistance - Patient > 75%   Problem: RH Dressing Goal: LTG Patient will perform upper body dressing (OT) Description: LTG Patient will perform upper body dressing with assist, with/without cues (OT). Flowsheets (Taken 04/21/2024 1610) LTG: Pt will perform upper body dressing with assistance level of: Supervision/Verbal cueing Goal: LTG Patient will perform lower body dressing w/assist (OT) Description: LTG: Patient will perform lower body dressing with assist, with/without cues in positioning using equipment (OT) Flowsheets (Taken 04/21/2024 1610) LTG: Pt will perform lower body dressing with assistance level of: Minimal Assistance - Patient > 75%   Problem: RH Toileting Goal: LTG Patient will perform toileting task (3/3 steps) with assistance level (OT) Description: LTG: Patient will perform toileting task (3/3 steps) with assistance level (OT)  Flowsheets (Taken 04/21/2024 1610) LTG: Pt will  perform toileting task (3/3 steps) with assistance level: Minimal Assistance - Patient > 75%   Problem: RH Functional Use of Upper Extremity Goal: LTG Patient will use RT/LT upper extremity as a (OT) Description: LTG: Patient will use right/left upper extremity as a stabilizer/gross assist/diminished/nondominant/dominant level with assist, with/without cues during functional activity (OT) Flowsheets (Taken 04/21/2024 1610) LTG: Use of upper extremity in functional activities: RUE as nondominant level LTG: Pt will use upper extremity in functional activity with assistance level of: Independent with assistive device   Problem: RH Toilet Transfers Goal: LTG Patient will perform toilet transfers w/assist (OT) Description: LTG: Patient will perform toilet transfers with assist, with/without cues using equipment (OT) Flowsheets (Taken 04/21/2024 1610) LTG: Pt will perform toilet transfers with assistance level of: Minimal Assistance - Patient > 75%   Problem: RH Tub/Shower Transfers Goal: LTG Patient will perform tub/shower transfers w/assist (OT) Description: LTG: Patient will perform tub/shower transfers with assist, with/without cues using equipment (OT) Flowsheets (Taken 04/21/2024 1610) LTG: Pt will perform tub/shower stall transfers with assistance level of: Minimal Assistance - Patient > 75%   Problem: RH Memory Goal: LTG Patient will demonstrate ability for day to day recall/carry over during activities of daily living with assistance level (OT) Description: LTG:  Patient will demonstrate ability for day to day recall/carry over during activities of daily living with assistance level (OT). Flowsheets (Taken 04/21/2024 1610) LTG:  Patient will demonstrate ability for day to day recall/carry over during activities of daily living with assistance level (OT): Modified Independent   "

## 2024-04-21 NOTE — Evaluation (Signed)
 Physical Therapy Assessment and Plan  Patient Details  Name: Larry Dawson MRN: [id number] Date of Birth: 09-19-1955  PT Diagnosis: Abnormality of gait, Coordination disorder, Difficulty walking, Hemiparesis dominant, Impaired cognition, Impaired sensation, and Muscle weakness Rehab Potential: Good ELOS: 3 weeks   Today's Date: 04/21/2024 PT Individual Time: 8694-8584 PT Individual Time Calculation (min): 70 min    Hospital Problem: Principal Problem:   Cavernoma   Past Medical History:  Past Medical History:  Diagnosis Date   Allergy    Anal fissure    COPD (chronic obstructive pulmonary disease) (HCC)    Albuterol  PRN.   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;  Surgeon: [name] Dino HERO, MD;  Location: [facility] OR;  Service: Neurosurgery;  Laterality: Left;   HERNIA REPAIR  prior to 2013   HYDROCELE EXCISION Bilateral 11/23/2013   Procedure: HYDROCELECTOMY ADULT;  Surgeon: Alm GORMAN Fragmin, MD;  Location: [facility];  Service: Urology;  Laterality: Bilateral;   VASECTOMY  prior to 2013    Assessment & Plan Clinical Impression: Patient is a 69 year old right-handed male with history significant for COPD/tobacco use, hypertension, hyperlipidemia, diabetes mellitus, IBS, class II obesity with BMI 35.29 as well as recent admission 12/8 - 12/10 for feeling lightheaded/off-balance was 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 Per chart review patient lives with spouse. Two-level home bed and bath upstairs 4 steps to enter. 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 Presented 04/01/2024 with increasing right sided weakness/numbness/ataxia and intermittent diplopia with slurring of speech and difficulty swallowing. Patient also with issues of mild headache. Denied any nausea or vomiting. 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 Admission chemistries unremarkable except potassium 3.1, glucose 116, urine drug screen negative. Recent echocardiogram with ejection fraction of 50 to 55% no wall motion abnormalities. Patient underwent posterior craniotomy with tumor resection 04/13/2024 per Dr. [name]. He was cleared to begin subcutaneous heparin  for DVT prophylaxis 04/14/2024. Therapy evaluations completed due to patient's decreased functional mobility and right-sided weakness was admitted for a comprehensive rehab program.   Patient currently requires max with mobility secondary to muscle weakness, decreased cardiorespiratoy endurance, impaired timing and sequencing, unbalanced muscle activation, decreased coordination, and decreased motor planning, decreased visual acuity, decreased midline orientation and decreased attention to right, decreased attention, decreased awareness, decreased problem solving, decreased safety awareness, decreased memory, and delayed processing, and decreased sitting balance, decreased standing balance, decreased postural control, hemiplegia, and decreased balance strategies.  Prior to hospitalization, patient was independent  with mobility and lived with Spouse in a House home.  Home access is 4Stairs to enter.  Patient will benefit from skilled PT intervention to maximize safe functional mobility, minimize fall  risk, and decrease caregiver burden for planned discharge home with 24 hour supervision.  Anticipate patient will benefit from follow up OP at discharge.  PT -  End of Session Activity Tolerance: Tolerates 30+ min activity with multiple rests Endurance Deficit: Yes Endurance Deficit Description: rest breaks with all mobility tasks PT Assessment Rehab Potential (ACUTE/IP ONLY): Good PT Barriers to Discharge: Inaccessible home environment;Decreased caregiver support;Home environment access/layout PT Barriers to Discharge Comments: 4 STE, caregiver support not sufficient for pt current functional abilities PT Patient demonstrates impairments in the following area(s): Balance;Behavior;Endurance;Motor;Safety;Pain;Sensory;Perception PT Transfers Functional Problem(s): Bed Mobility;Bed to Chair;Car;Furniture PT Locomotion Functional Problem(s): Ambulation;Stairs;Wheelchair Mobility PT Plan PT Intensity: Minimum of 1-2 x/day ,45 to 90 minutes PT Frequency: 5 out of 7 days PT Duration Estimated Length of Stay: 3 weeks PT Treatment/Interventions: Ambulation/gait training;Community reintegration;DME/adaptive equipment instruction;Neuromuscular re-education;Psychosocial support;Stair training;UE/LE Strength taining/ROM;Wheelchair propulsion/positioning;Balance/vestibular training;Discharge planning;Functional electrical stimulation;Pain management;Therapeutic Activities;UE/LE Coordination activities;Cognitive remediation/compensation;Disease management/prevention;Functional mobility training;Patient/family education;Splinting/orthotics;Therapeutic Exercise;Visual/perceptual remediation/compensation PT Transfers Anticipated Outcome(s): supervision PT Locomotion Anticipated Outcome(s): supervision ambulatory PT Recommendation Recommendations for Other Services: Neuropsych consult Follow Up Recommendations: Outpatient PT Patient destination: Home Equipment Recommended: To be determined   PT  Evaluation Precautions/Restrictions Restrictions Weight Bearing Restrictions Per Provider Order: No Pain Interference Pain Interference Pain Effect on Sleep: 2. Occasionally Pain Interference with Therapy Activities: 1. Rarely or not at all Pain Interference with Day-to-Day Activities: 1. Rarely or not at all Home Living/Prior Functioning Home Living Available Help at Discharge: Family;Available 24 hours/day Type of Home: House Home Access: Stairs to enter [employer] of Steps: 4 Entrance Stairs-Rails: Left Home Layout: Two level;Full bath on main level;Able to live on main level with bedroom/bathroom Alternate Level Stairs-Number of Steps: Flight Bathroom Shower/Tub: Engineer, Manufacturing Systems: Handicapped height Bathroom Accessibility: Yes Additional Comments: drives a tour bus  Lives With: Spouse Prior Function Level of Independence: Independent with basic ADLs;Independent with homemaking with ambulation;Independent with gait;Independent with transfers  Able to Take Stairs?: Yes Driving: Yes Vocation: Part time employment Cognition Overall Cognitive Status: Impaired/Different from baseline Arousal/Alertness: Awake/alert Orientation Level: Oriented X4 Year: 2026 Month: January Day of Week: Correct Attention: Selective Selective Attention: Impaired Selective Attention Impairment: Verbal complex;Functional complex Memory: Appears intact Awareness: Appears intact Problem Solving: Impaired Problem Solving Impairment: Verbal complex;Functional complex Safety/Judgment: Appears intact Sensation Sensation Light Touch: Impaired Detail Peripheral sensation comments: Reports tingling in RUE/RLE Light Touch Impaired Details: Impaired RUE;Impaired RLE Hot/Cold: Not tested Proprioception: Impaired by gross assessment Stereognosis: Not tested Coordination Gross Motor Movements are Fluid and Coordinated: No Fine Motor Movements are Fluid and Coordinated:  No Coordination and Movement Description: 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  Trunk/Postural Assessment  Cervical Assessment Cervical Assessment: Exceptions to [facility] (Forward head) Thoracic Assessment Thoracic Assessment: Exceptions to [facility] (Rounded shoulders) Lumbar Assessment Lumbar Assessment: Exceptions to [facility] (Posterior pelvic tilt) Postural Control Postural Control: Deficits on evaluation Trunk Control: Lateral lean Righting Reactions: delayed Protective Responses: decreased  Balance Balance Balance Assessed: Yes Static Sitting Balance Static Sitting - Balance Support: Feet supported Static Sitting - Level of Assistance: 4: Min assist;3: Mod assist Dynamic Sitting Balance Dynamic Sitting - Balance Support: Feet supported;During functional activity Dynamic Sitting - Level of Assistance: 4: Min assist Static Standing Balance Static Standing - Balance Support: During functional activity;Bilateral upper extremity supported Static Standing - Level of Assistance: 2: Max assist Dynamic Standing Balance Dynamic Standing - Balance Support: Bilateral upper extremity supported;During functional activity Dynamic Standing - Level of Assistance: 2: Max assist Extremity Assessment   RLE Assessment RLE Assessment: Exceptions to [facility] General Strength Comments: tested in supine - difficulty  to assess formally due to motor planning deficits RLE Strength Right Hip Flexion: 2+/5 Right Knee Flexion: 2+/5 Right Knee Extension: 2/5 Right Ankle Dorsiflexion: 2-/5 Right Ankle Plantar Flexion: 2/5 LLE Assessment LLE Assessment: Within Functional Limits  Care Tool Care Tool Bed Mobility Roll left and right activity   Roll left and right assist level: Moderate Assistance - Patient 50 - 74%    Sit to lying activity   Sit to lying assist level:  Moderate Assistance - Patient 50 - 74%    Lying to sitting on side of bed activity   Lying to sitting on side of bed assist level: the ability to move from lying on the back to sitting on the side of the bed with no back support.: Moderate Assistance - Patient 50 - 74%     Care Tool Transfers Sit to stand transfer   Sit to stand assist level: Moderate Assistance - Patient 50 - 74%    Chair/bed transfer   Chair/bed transfer assist level: Dependent - Armed Forces Operational Officer transfer activity did not occur: Safety/medical concerns        Care Tool Locomotion Ambulation   Assist level: 2 helpers Assistive device: Gillie Max distance: 32'  Walk 10 feet activity   Assist level: 2 helpers Assistive device: Walker-Eva   Walk 50 feet with 2 turns activity Walk 50 feet with 2 turns activity did not occur: Safety/medical concerns      Walk 150 feet activity Walk 150 feet activity did not occur: Safety/medical concerns      Walk 10 feet on uneven surfaces activity Walk 10 feet on uneven surfaces activity did not occur: Safety/medical concerns      Stairs Stair activity did not occur: Safety/medical concerns        Walk up/down 1 step activity Walk up/down 1 step or curb (drop down) activity did not occur: Safety/medical concerns      Walk up/down 4 steps activity Walk up/down 4 steps activity did not occur: Safety/medical concerns      Walk up/down 12 steps activity Walk up/down 12 steps activity did not occur: Safety/medical concerns      Pick up small objects from floor Pick up small object from the floor (from standing position) activity did not occur: Safety/medical concerns      Wheelchair Is the patient using a wheelchair?: Yes Type of Wheelchair: Manual   Wheelchair assist level: Maximal Assistance - Patient 25 - 49% Max wheelchair distance: 41'  Wheel 50 feet with 2 turns activity   Assist Level: Maximal Assistance - Patient 25 - 49%  Wheel 150  feet activity   Assist Level: Total Assistance - Patient < 25%    Refer to Care Plan for Long Term Goals  SHORT TERM GOAL WEEK 1 PT Short Term Goal 1 (Week 1): Pt will complete transfers with min assist and LRAD PT Short Term Goal 2 (Week 1): Pt will ambulate 69' with LRAD and mod assist PT Short Term Goal 3 (Week 1): Pt will complete up/down 1 step with BHRs with mod assist  Recommendations for other services: Neuropsych  Skilled Therapeutic Intervention Evaluation completed (see details above and below) with education on PT POC and goals and individual treatment initiated with focus on therapeutic activities to facilitate improved transfer mechanics and postural stability for self care tasks, gait training for upright endurance and sequencing as well as WC mobility training. Pt reports slight headache and R ankle pain (unrated) at start  of session premedicated. Pt completes bed mobility with mod assist with hospital bed features. Pt completes transfer via stedy with min assist for stand, cues for L lateral lean as pt demonstrating R knee flexion and R lean in high perched sitting. Pt comes to sitting in [facility] and transported to day room. Pt completes gait in //bars with mod assist for RLE advance and stance phase to prevent R knee buckle as well as R foot placement as pt with tendency to scissor, max cues for sequencing for weight shift and R quad activation in stance. Pt takes extended seated rest break. Pt then completes gait training with eva walker 32', slow gait speed, max cues similar to above with mod assist x2 for gait for postural stability, managing eva walker and bringing WC in tow. Pt completes WC mobility with L hemi technique with max assist 51' with cues for sequencing. Pt transported back to room, pt requesting to use toilet. Pt completes toilet transfer via stedy with max assist for managing pants with pt in standing, BUE support on stedy, RN notified of pt position and pt verbalizing  and demonstrating ability to pull call light in bathroom when finished at end of session.   Mobility Bed Mobility Bed Mobility: Supine to Sit;Sit to Supine Supine to Sit: Moderate Assistance - Patient 50-74% Transfers Transfers: Sit to Stand;Stand to Sit;Squat Pivot Transfers Sit to Stand: Dependent - mechanical lift Stand to Sit: Dependent - mechanical lift Squat Pivot Transfers: Dependent - mechanical lift Transfer (Assistive device): Other (Comment) Transfer via Lift Equipment: Engineering Geologist: Yes Gait Assistance: 2 Teacher, Music (Feet): 32 Feet Assistive device: Technical Brewer Assistance Details: Verbal cues for gait pattern;Manual facilitation for weight bearing;Manual facilitation for weight shifting;Verbal cues for safe use of DME/AE;Verbal cues for technique;Verbal cues for precautions/safety;Verbal cues for sequencing;Visual cues/gestures for sequencing;Visual cues/gestures for precautions/safety;Visual cues for safe use of DME/AE;Tactile cues for placement;Tactile cues for weight shifting Gait Gait: Yes Gait Pattern: Impaired Gait Pattern: Step-to pattern;Decreased dorsiflexion - right;Poor foot clearance - right;Narrow base of support;Ataxic;Right flexed knee in stance;Decreased weight shift to left;Decreased stance time - right;Decreased step length - left Gait velocity: dec Stairs / Additional Locomotion Stairs: No Wheelchair Mobility Wheelchair Mobility: Yes Wheelchair Assistance: Maximal Assistance - Patient 25 - 49% Wheelchair Propulsion: Left lower extremity;Left upper extremity Wheelchair Parts Management: Needs assistance Distance: 67'   Discharge Criteria: Patient will be discharged from PT if patient refuses treatment 3 consecutive times without medical reason, if treatment goals not met, if there is a change in medical status, if patient makes no progress towards goals or if patient is discharged from hospital.  The above  assessment, treatment plan, treatment alternatives and goals were discussed and mutually agreed upon: by patient  Reche Ohara PT, DPT 04/21/2024, 5:48 PM

## 2024-04-21 NOTE — Plan of Care (Signed)
" °  Problem: RH Swallowing Goal: LTG Patient will consume least restrictive diet using compensatory strategies with assistance (SLP) Description: LTG:  Patient will consume least restrictive diet using compensatory strategies with assistance (SLP) Flowsheets (Taken 04/21/2024 1224) LTG: Pt Patient will consume least restrictive diet using compensatory strategies with assistance of (SLP): Modified Independent   Problem: RH Expression Communication Goal: LTG Patient will increase speech intelligibility (SLP) Description: LTG: Patient will increase speech intelligibility at word/phrase/conversation level with cues, % of the time (SLP) Flowsheets (Taken 04/21/2024 1224) LTG: Patient will increase speech intelligibility (SLP): Modified Independent Level: Conversation level Percent of time patient will use intelligible speech: 80   Problem: RH Problem Solving Goal: LTG Patient will demonstrate problem solving for (SLP) Description: LTG:  Patient will demonstrate problem solving for basic/complex daily situations with cues  (SLP) Flowsheets (Taken 04/21/2024 1224) LTG Patient will demonstrate problem solving for: Modified Independent   Problem: RH Attention Goal: LTG Patient will demonstrate this level of attention during functional activites (SLP) Description: LTG:  Patient will will demonstrate this level of attention during functional activites (SLP) Flowsheets (Taken 04/21/2024 1224) Patient will demonstrate during cognitive/linguistic activities the attention type of: Selective Patient will demonstrate this level of attention during cognitive/linguistic activities in: Controlled LTG: Patient will demonstrate this level of attention during cognitive/linguistic activities with assistance of (SLP): Modified Independent   "

## 2024-04-21 NOTE — Progress Notes (Signed)
 Inpatient Rehabilitation  Patient information reviewed and entered into eRehab system by [facility]. Karen Kays., CCC/SLP, PPS Coordinator.  Information including medical coding, functional ability and quality indicators will be reviewed and updated through discharge.

## 2024-04-21 NOTE — Plan of Care (Signed)
" °  Problem: Consults Goal: RH STROKE PATIENT EDUCATION Description: See Patient Education module for education specifics  Outcome: Progressing   Problem: RH BOWEL ELIMINATION Goal: RH STG MANAGE BOWEL WITH ASSISTANCE Description: STG Manage Bowel with supervision Assistance. Outcome: Progressing   Problem: RH BLADDER ELIMINATION Goal: RH STG MANAGE BLADDER WITH ASSISTANCE Description: STG Manage Bladder With supervision  Assistance Outcome: Progressing   Problem: RH SKIN INTEGRITY Goal: RH STG SKIN FREE OF INFECTION/BREAKDOWN Description: Skin free of infection with supervision assistance Outcome: Progressing   Problem: RH SAFETY Goal: RH STG ADHERE TO SAFETY PRECAUTIONS W/ASSISTANCE/DEVICE Description: STG Adhere to Safety Precautions With supervision Assistance/Device. Outcome: Progressing   Problem: RH PAIN MANAGEMENT Goal: RH STG PAIN MANAGED AT OR BELOW PT'S PAIN GOAL Description: <4 w/ prn Outcome: Progressing   Problem: RH KNOWLEDGE DEFICIT Goal: RH STG INCREASE KNOWLEDGE OF DIABETES Description: Manage increase knowledge of diabetes with supervision assistance from wife using educational materials provided  Outcome: Progressing Goal: RH STG INCREASE KNOWLEDGE OF HYPERTENSION Description: Manage increase knowledge of hypertension with supervision assistance from wife using educational materials provided  Outcome: Progressing   "

## 2024-04-21 NOTE — Progress Notes (Signed)
 Chaplain responded to a consult request for Advance Directive education. Chaplain introduced spiritual care and offered support in the setting of inpatient admission.   Mr Rosas shared that he previously completed a living will and would like his wife to be his health care agent. He is unsure if he has completed HCPOA documentation and this clinical research associate informed him that state law would make his spouse the default HCPOA regardless of documentation. Chaplain requested that Mr. Michon ask his spouse to bring his documents into the hospital for scanning into the EMR when he has an opportunity.  Mr Muzquiz also stated that someone talked to him about whether he would want to be resuscitated and chaplain informed him that would be managed with a DNR which is a medical order. Chaplain informed pt that I would check his status in his chart and mention to his medical team that he would like to have a conversation about DNR if it was not currently in place, which he appreciated.  Chaplain notes that record indicates that he is still full code and conveyed his request to the medical team via secure chat.  Alan HERO. Davee Lomax, M.Div. [facility] Chaplain Pager [phone] Office [phone]    Alan HERO. Davee Lomax, M.Div. [facility] Chaplain Pager [phone] Office [phone]      04/21/24 [id number]  Spiritual Encounters  Type of Visit Initial  Care provided to: Patient  Reason for visit Advance directives  Advance Directives (For Healthcare)  Does Patient Have a Medical Advance Directive? Yes  Does patient want to make changes to medical advance directive? Yes (Inpatient - patient defers changing a medical advance directive at this time - Information given)

## 2024-04-21 NOTE — Patient Care Conference (Signed)
 Inpatient RehabilitationTeam Conference and Plan of Care Update Date: 04/21/2024   Time: 1033 am    Patient Name: Larry Dawson      Medical Record Number: [id number]  Date of Birth: [birth year] Sex: Male         Room/Bed: 4W11C/4W11C-01 Payor Info: Payor: [employer] MEDICARE / Plan: [employer] MEDICARE HMO / Product Type: *No Product type* /    Admit Date/Time:  04/20/2024  2:26 PM  Primary Diagnosis:  Cavernoma  Hospital Problems: Principal Problem:   Cavernoma    Expected Discharge Date: Expected Discharge Date:  (TBD)  Team Members Present: Physician leading conference: Dr. Joesph Likes Social Worker Present: Graeme Jude, LCSW Nurse Present: Eulalio Falls, RN PT Present: Catilin Osborn, PT OT Present: Nereida Habermann, OT SLP Present: Rosina Downy, SLP     Current Status/Progress Goal Weekly Team Focus  Bowel/Bladder   Continent B/B with some bladder incontinence episode. LBM 04/20/24   Will regain full B/B continence with normal pattern   Assit with toileting neds q3-4hrs every shift while awake    Swallow/Nutrition/ Hydration               ADL's   Min-Mod A for UB care; Max A for LB care; Max +2 for squat-pivots. Barriers: Poor midline orientation (R lateral lean in standing).   CGA-Min A overall   Functional transfers, standing/sitting balance, RUE/RLE    Mobility   eval pending           Communication      Eval pending          Safety/Cognition/ Behavioral Observations     Eval pending          Pain   Verbalizes 3/10 pain to incision site. (craniotomy)   Will be free from pain   Assess patient for pain qshift and prn and medicate per provider's orders.    Skin   Left posterior craniotomy. 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 Patient with pain on surgical site, constipation, urgency : medication and treatment adjusted by MD. Patient with cognitive deficits, right side weakness, ataxia, diplopia and poor midline orientation.  Patient on target to meet rehab goals: Patient needs min- mod assistance with upper body care and needs max assistance with lower body care. Patient needs max assist +2 with squat pivot transfers. Other evals are pending.  *See Care Plan and progress notes for long and short-term goals.   Revisions to Treatment Plan:  Timed toileting Taped glasses  Teaching Needs: Safety, medications, transfers , toileting, dietary modifications, etc.   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 [name], postsurgical wound care, diabetes, hypertension, COPD, constipation, and cognitive deficits.  Barriers to Discharge: Behavior/Mood;Medical stability;Self-care education;Uncontrolled Pain   Possible Resolutions to [name]: Encouraged sleep-wake cycle and cognitive stimulation, treat pain with minimally sedating regimen, monitor blood sugars and titrate diabetes regimen, monitor vitals for hypertension and COPD   Continued Need for Acute Rehabilitation Level of Care: The patient requires daily medical management by a physician with specialized training in physical medicine and rehabilitation  for the following reasons: Direction of a multidisciplinary physical rehabilitation program to maximize functional independence : Yes Medical management of patient stability for increased activity during participation in an intensive rehabilitation regime.: Yes Analysis of laboratory values and/or radiology reports with  any subsequent need for medication adjustment and/or medical intervention. : Yes   I attest that I was present, lead the team conference, and concur with the assessment and plan of the team.   [name] 04/21/2024,  1033 am

## 2024-04-21 NOTE — Evaluation (Signed)
 Occupational Therapy Assessment and Plan  Patient Details  Name: Larry Dawson MRN: [id number] Date of Birth: [birth year]  OT Diagnosis: Hemiparesis of dominant side, muscle weakness (generalized), decreased activity tolerance, and decreased balance strategies Rehab Potential: Rehab Potential (ACUTE ONLY): Good ELOS: ~3 weeks   Today's Date: 04/21/2024 OT Individual Time: 0920-1030 OT Individual Time Calculation (min): 70 min     Hospital Problem: Principal Problem:   Cavernoma   Past Medical History:  Past Medical History:  Diagnosis Date   Allergy    Anal fissure    COPD (chronic obstructive pulmonary disease) (HCC)    Albuterol  PRN.   Diabetes mellitus without complication (HCC)    Hydrocele    Hyperlipidemia    Hypertension    IBS (irritable bowel syndrome) 05/22/2013   per chart note-pt denied   Past Surgical History:  Past Surgical History:  Procedure Laterality Date   APPLICATION OF CRANIAL NAVIGATION  04/13/2024   Procedure: COMPUTER-ASSISTED NAVIGATION, FOR CRANIAL PROCEDURE;  Surgeon: [name] Dino HERO, MD;  Location: [facility] OR;  Service: Neurosurgery;;   CRANIOTOMY Left 04/13/2024   Procedure: Left Posterior Fossa Craniotomy for ARTERIO-VENOUS MALFORMATION DURAL COMPLEX;  Surgeon: [name] Dino HERO, MD;  Location: [facility] OR;  Service: Neurosurgery;  Laterality: Left;   HERNIA REPAIR  prior to 2013   HYDROCELE EXCISION Bilateral 11/23/2013   Procedure: HYDROCELECTOMY ADULT;  Surgeon: Alm GORMAN Fragmin, MD;  Location: [facility];  Service: Urology;  Laterality: Bilateral;   VASECTOMY  prior to 2013    Assessment & Plan Clinical Impression: Patient is a 69 year old right-handed male with history significant for COPD/tobacco use, hypertension, hyperlipidemia, diabetes mellitus, IBS, class II obesity with BMI 35.29 as well as recent admission 12/8 - 12/10 for feeling lightheaded/off-balance was 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 Per chart review patient lives with spouse. Two-level home bed and bath upstairs 4 steps to enter. 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 Presented 04/01/2024 with increasing right sided weakness/numbness/ataxia and intermittent diplopia with slurring of speech and difficulty swallowing. Patient also with issues of mild headache. Denied any nausea or vomiting. 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 Admission chemistries unremarkable except potassium 3.1, glucose 116, urine drug screen negative. Recent echocardiogram with ejection fraction of 50 to 55% no wall motion abnormalities. Patient underwent posterior craniotomy with tumor resection 04/13/2024 per Dr. [name]. He was cleared to begin subcutaneous heparin  for DVT prophylaxis 04/14/2024. Therapy evaluations completed due to patient's decreased functional mobility and right-sided weakness was admitted for a comprehensive rehab program.  Patient transferred to CIR on 04/20/2024 .    Patient currently requires Mod-Max A with basic self-care skills secondary to muscle weakness, decreased cardiorespiratoy endurance, abnormal tone, unbalanced muscle activation, decreased coordination, and decreased motor planning, decreased midline orientation, decreased awareness, decreased safety awareness, and decreased memory, and decreased sitting balance, decreased standing balance, decreased postural control, hemiplegia, and decreased balance strategies.  Prior to hospitalization, patient could complete BADLs/IADLs independently.   Patient will benefit from skilled intervention to increase independence with basic self-care skills prior to discharge home with care partner.  Anticipate patient will require minimal physical assistance and follow up home  health.  OT - End of Session Activity Tolerance: Tolerates 10 - 20 min activity with multiple rests Endurance Deficit: Yes OT Assessment Rehab Potential (  ACUTE ONLY): Good OT Barriers to Discharge: Home environment access/layout;Wound Care OT Patient demonstrates impairments in the following area(s): Balance;Cognition;Endurance;Motor;Safety;Perception;Vision OT Basic ADL's Functional Problem(s): Bathing;Dressing;Toileting OT Transfers Functional Problem(s): Toilet;Tub/Shower OT Additional Impairment(s): Fuctional Use of Upper Extremity OT Plan OT Intensity: Minimum of 1-2 x/day, 45 to 90 minutes OT Frequency: 5 out of 7 days OT Duration/Estimated Length of Stay: ~3 weeks OT Treatment/Interventions: Balance/vestibular training;Discharge planning;DME/adaptive equipment instruction;Functional electrical stimulation;Functional mobility training;Neuromuscular re-education;Pain management;Patient/family education;Psychosocial support;Self Care/advanced ADL retraining;Skin care/wound managment;Splinting/orthotics;Therapeutic Exercise;Therapeutic Activities;UE/LE Strength taining/ROM;UE/LE Coordination activities;Visual/perceptual remediation/compensation;Wheelchair propulsion/positioning OT Basic Self-Care Anticipated Outcome(s): Min A OT Toileting Anticipated Outcome(s): Min A OT Bathroom Transfers Anticipated Outcome(s): CGA OT Recommendation Recommendations for Other Services: Neuropsych consult;Therapeutic Recreation consult Therapeutic Recreation Interventions: Stress management;Pet therapy Patient destination: Home Follow Up Recommendations: Outpatient OT Equipment Recommended: To be determined   OT Evaluation Precautions/Restrictions  Precautions Precautions: Fall Recall of Precautions/Restrictions: Impaired Precaution/Restrictions Comments: R-hemiparesis/hemiplegia Restrictions Weight Bearing Restrictions Per Provider Order: No General Chart Reviewed: Yes Family/Caregiver  Present: No Pain Pain Assessment Pain Scale: 0-10 Pain Score: 3  Pain Type: Acute pain Pain Location: Head Pain Descriptors / Indicators: Headache Pain Intervention(s): Medication (See eMAR);Rest Home Living/Prior Functioning Home Living Family/patient expects to be discharged to:: Private residence Living Arrangements: Spouse/significant other Type of Home: House Home Access: Stairs to enter Secretary/administrator of Steps: 4 Entrance Stairs-Rails: Right Home Layout: Two level, Bed/bath upstairs, Full bath on main level, Able to live on main level with bedroom/bathroom Alternate Level Stairs-Number of Steps: Flight Alternate Level Stairs-Rails: Right Bathroom Shower/Tub: Tub/shower unit (No DME) Bathroom Toilet: Handicapped height Bathroom Accessibility: Yes  Lives With: Spouse IADL History Current License: Yes Occupation: Part time employment Type of Occupation: Designer, industrial/product Prior Function Level of Independence: Independent with basic ADLs, Independent with homemaking with ambulation, Independent with gait, Independent with transfers Driving: Yes Vision Baseline Vision/History: 1 Wears glasses Ability to See in Adequate Light: 1 Impaired Vision Assessment?: Vision impaired- to be further tested in functional context Perception  Perception: Impaired Praxis Praxis: Impaired Cognition Cognition Overall Cognitive Status: Within Functional Limits for tasks assessed Arousal/Alertness: Awake/alert Orientation Level: Person;Place;Situation Brief Interview for Mental Status (BIMS) Repetition of Three Words (First Attempt): 3 Temporal Orientation: Year: Correct Temporal Orientation: Month: Accurate within 5 days Temporal Orientation: Day: Correct Recall: Blue: Yes, no cue required Recall: Bed: Yes, no cue required BIMS Summary Score: 99 Sensation Sensation Light Touch: Impaired Detail Peripheral sensation comments: Reports tingling in RUE/RLE Light  Touch Impaired Details: Impaired RUE;Impaired RLE Proprioception: Impaired by gross assessment Coordination Gross Motor Movements are Fluid and Coordinated: No Fine Motor Movements are Fluid and Coordinated: No Coordination and Movement Description: 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  Trunk/Postural Assessment  Cervical Assessment Cervical Assessment: Exceptions to [facility] (Forward head) Thoracic Assessment Thoracic Assessment: Exceptions to [facility] (Rounded shoulders) Lumbar Assessment Lumbar Assessment: Exceptions to [facility] (Posterior pelvic tilt) Postural Control Postural Control: Deficits on evaluation Trunk Control: Lateral lean  Balance Balance Balance Assessed: Yes Static Sitting Balance Static Sitting - Balance Support: Feet supported Static Sitting - Level of Assistance: 4: Min assist;3: Mod assist Static Standing Balance Static Standing - Balance Support: During functional activity;Bilateral upper extremity supported Static Standing - Level of Assistance: 2: Max assist Extremity/Trunk Assessment RUE Assessment RUE Assessment: Exceptions to [facility] Active Range of Motion (AROM) Comments: ~80 degrees of shoulder flexion/abduction General Strength Comments: 2-/5 RUE Body System: Neuro Brunstrum  levels for arm and hand: Arm;Hand Brunstrum level for arm: Stage V Relative Independence from Synergy Brunstrum level for hand: Stage VI Isolated joint movements RUE Tone RUE Tone: Mild;Modified Ashworth Body Part - Modified Ashworth Scale: Elbow Elbow - Modified Ashworth Scale for Grading Hypertonia RUE: Slight increase in muscle tone, manifested by a catch and release or by minimal resistance at the end of the range of motion when the affected part(s) is moved in flexion or extension LUE Assessment LUE Assessment: Within Functional Limits  Care  Tool Care Tool Self Care Eating   Eating Assist Level: Set up assist    Oral Care    Oral Care Assist Level: Set up assist    Bathing   Body parts bathed by patient: Right arm;Chest;Abdomen;Front perineal area;Right upper leg;Left upper leg;Face Body parts bathed by helper: Left arm;Right lower leg;Left lower leg;Buttocks   Assist Level: Moderate Assistance - Patient 50 - 74%    Upper Body Dressing(including orthotics)   What is the patient wearing?: Pull over shirt   Assist Level: Minimal Assistance - Patient > 75%    Lower Body Dressing (excluding footwear)   What is the patient wearing?: Pants Assist for lower body dressing: 2 Helpers    Putting on/Taking off footwear     Assist for footwear: Dependent - Patient 0%       Care Tool Toileting Toileting activity   Assist for toileting: 2 Helpers     Care Tool Bed Mobility Roll left and right activity        Sit to lying activity        Lying to sitting on side of bed activity         Care Tool Transfers Sit to stand transfer        Chair/bed transfer         Toilet transfer   Assist Level: 2 Helpers     Care Tool Cognition  Expression of Ideas and Wants Expression of Ideas and Wants: 4. Without difficulty (complex and basic) - expresses complex messages without difficulty and with speech that is clear and easy to understand  Understanding Verbal and Non-Verbal Content Understanding Verbal and Non-Verbal Content: 3. Usually understands - understands most conversations, but misses some part/intent of message. Requires cues at times to understand   Memory/Recall Ability Memory/Recall Ability : That he or she is in a hospital/hospital unit;Current season   Refer to Care Plan for Long Term Goals  SHORT TERM GOAL WEEK 1 OT Short Term Goal 1 (Week 1): Pt will maintain dynamic sitting balance with CGA in preparation for ADL participation. OT Short Term Goal 2 (Week 1): Pt will maintain static standing balance  with Max (x1) in preparation for ADL participation. OT Short Term Goal 3 (Week 1): Pt with complete grooming tasks with supervision + BUE integration.  Recommendations for other services: Neuropsych and Therapeutic Recreation  Pet therapy and Stress management   Skilled Therapeutic Intervention  Session began with introduction to OT role, OT POC, and general orientation to rehab unit/schedule. Pt completes full-body dressing and sink-side grooming with levels of assistance noted below. 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 Squat-pivot with Max A x2. Pt remained sitting in WC with posey pad activated and immediate needs met.   ADL ADL Eating: Set up;Minimal assistance Where Assessed-Eating: Bed level Grooming: Setup;Minimal assistance Where Assessed-Grooming: Sitting at sink Upper Body Bathing: Moderate assistance Where Assessed-Upper Body Bathing: Edge of bed Lower Body Bathing: Maximal assistance Where  Assessed-Lower Body Bathing: Edge of bed Upper Body Dressing: Minimal assistance Where Assessed-Upper Body Dressing: Edge of bed Lower Body Dressing: Dependent Where Assessed-Lower Body Dressing: Edge of bed Toileting: Maximal assistance Where Assessed-Toileting: Bedside Commode;Teacher, Adult Education: Maximal Advice Worker Method: Ambulance Person: Gaffer: Not assessed Film/video Editor: Not assessed Mobility  Bed Mobility Bed Mobility: Supine to Sit;Sit to Supine Transfers Sit to Stand: 2 Helpers Stand to Sit: 2 Helpers   Discharge Criteria: Patient will be discharged from OT if patient refuses treatment 3 consecutive times without medical reason, if treatment goals not met, if there is a change in medical status, if patient makes no progress towards goals or if patient is discharged from hospital.  The above assessment, treatment plan, treatment alternatives and goals were  discussed and mutually agreed upon: by patient  Nereida Habermann, OTR/L, MSOT  04/21/2024, 10:44 AM

## 2024-04-22 DIAGNOSIS — D1800 Hemangioma unspecified site: Secondary | ICD-10-CM | POA: Diagnosis not present

## 2024-04-22 LAB — GLUCOSE, CAPILLARY
Glucose-Capillary: 102 mg/dL — ABNORMAL HIGH (ref 70–99)
Glucose-Capillary: 144 mg/dL — ABNORMAL HIGH (ref 70–99)
Glucose-Capillary: 87 mg/dL (ref 70–99)
Glucose-Capillary: 126 mg/dL — ABNORMAL HIGH (ref 70–99)

## 2024-04-22 MED ORDER — DICLOFENAC SODIUM 1 % EX GEL
2.0000 g | Freq: Four times a day (QID) | CUTANEOUS | Status: DC
  Administered 2024-04-22 – 2024-05-05 (×26): 2 g via TOPICAL
  Filled 2024-04-22: qty 100

## 2024-04-22 MED ORDER — BACLOFEN 5 MG HALF TABLET
5.0000 mg | ORAL_TABLET | Freq: Three times a day (TID) | ORAL | Status: DC
  Administered 2024-04-22 – 2024-04-28 (×19): 5 mg via ORAL
  Filled 2024-04-22 (×18): qty 1

## 2024-04-22 NOTE — Progress Notes (Signed)
 Physical Therapy Session Note  Patient Details  Name: Larry Dawson MRN: [id number] Date of Birth: 15-Nov-1955  Today's Date: 04/22/2024 PT Individual Time: 9091-8981 PT Individual Time Calculation (min): 70 min   Short Term Goals: Week 1:  PT Short Term Goal 1 (Week 1): Pt will complete transfers with min assist and LRAD PT Short Term Goal 2 (Week 1): Pt will ambulate 66' with LRAD and mod assist PT Short Term Goal 3 (Week 1): Pt will complete up/down 1 step with BHRs with mod assist  Skilled Therapeutic Interventions/Progress Updates:   Pt presents in room in [facility], agreeable to PT. Pt reporting pain in R ankle. Session focused on gait training with R GRAFO, transfer training, and NMR for BUE/BLE coordination and R side muscle fiber recruitment. Pt transported to day room via WC. Therapist dons GRAFO on R foot to assist with RLE foot clearance and assist with prevention of R knee buckling. Pt completes sit to stand transfer to RW with cues for hand placement, mirror positioned in front of pt for correcting to midline as pt demonstrating excessive R lateropulsion with transfer and immediate standing balance, completes x3 attempts with improved midline with cues however continues to require mod/max assist for postural stability with stand. Pt then completes gait training with eva walker, ambulates 30' with heavy mod asisst, max verbal cues for sequencing, and mirror positioned in front of pt as visual cue, +2 for WC follow, increased time to complete. Pt then completes squat pivot transfer to L with light mod assist, cues for hand positioning and sequencing as well as positioning once seated. Pt completes NMR on nustep, continuous training x28 minutes at Level 3 then increase to Level 5 to promote improved BUE/BLE coordination with increased resistance. Pt provided with R grip assist and R hip alignment assist due to pt unable to maintain with cues. Pt states that he really enjoys this exercises and  feels that it's helping him get stronger. Pt completes squat pivot back to WC. Pt returns to room and remains seated in [facility] with all needs within reach, cal light in place and chair alarm donned and activated at end of session.  Pt also provided with handout for below exercises as pt requesting exercises to complete while in bed however due to time constraints unable to go over. Will complete as schedule allows in next session.  Access Code: [email or web address] URL: [email or web address] Date: 04/22/2024 Prepared by: Reche Ohara  Exercises - Supine Quadricep Sets  - 1 x daily - 7 x weekly - 3 sets - 10 reps - Supine Bridge  - 1 x daily - 7 x weekly - 3 sets - 10 reps - Supine March  - 1 x daily - 7 x weekly - 3 sets - 10 reps - Supine Active Straight Leg Raise  - 1 x daily - 7 x weekly - 3 sets - 10 reps  Therapy Documentation Precautions:  Precautions Precautions: Fall Recall of Precautions/Restrictions: Impaired Precaution/Restrictions Comments: R-hemiparesis/hemiplegia Restrictions Weight Bearing Restrictions Per Provider Order: No   Therapy/Group: Individual Therapy  Reche Ohara PT, DPT 04/22/2024, 4:54 PM

## 2024-04-22 NOTE — Care Management (Signed)
 [facility] Individual Statement of Services  Patient Name:  Larry Dawson  Date:  04/22/2024  Welcome to the [facility].  Our goal is to provide you with an individualized program based on your diagnosis and situation, designed to meet your specific needs.  With this comprehensive rehabilitation program, you will be expected to participate in at least 3 hours of rehabilitation therapies Monday-Friday, with modified therapy programming on the weekends.  Your rehabilitation program will include the following services:  Physical Therapy (PT), Occupational Therapy (OT), Speech Therapy (ST), 24 hour per day rehabilitation nursing, Therapeutic Recreaction (TR), Psychology, Neuropsychology, Care Coordinator, Rehabilitation Medicine, Nutrition Services, Pharmacy Services, and Other  Weekly team conferences will be held on Tuesday to discuss your progress.  Your Inpatient Rehabilitation Care Coordinator will talk with you frequently to get your input and to update you on team discussions.  Team conferences with you and your family in attendance may also be held.  Expected length of stay: 2-3 weeks    Overall anticipated outcome: Supervision  Depending on your progress and recovery, your program may change. Your Inpatient Rehabilitation Care Coordinator will coordinate services and will keep you informed of any changes. Your Inpatient Rehabilitation Care Coordinator's name and contact numbers are listed  below.  The following services may also be recommended but are not provided by the [facility]:  Driving Evaluations Home Health Rehabiltiation Services Outpatient Rehabilitation Services Vocational Rehabilitation   Arrangements will be made to provide these services after discharge if needed.  Arrangements include referral to agencies that provide these services.  Your insurance has been verified to be:  [facility]  Your primary  doctor is:  Emil Schaumann  Pertinent information will be shared with your doctor and your insurance company.  Inpatient Rehabilitation Care Coordinator:  Graeme Jude, KEN [phone] or [phone]  Information discussed with and copy given to patient by: Graeme DELENA Jude, 04/22/2024, 12:38 PM

## 2024-04-22 NOTE — Plan of Care (Signed)
" °  Problem: Consults Goal: RH STROKE PATIENT EDUCATION Description: See Patient Education module for education specifics  Outcome: Progressing   Problem: RH BOWEL ELIMINATION Goal: RH STG MANAGE BOWEL WITH ASSISTANCE Description: STG Manage Bowel with supervision Assistance. Outcome: Progressing   Problem: RH BLADDER ELIMINATION Goal: RH STG MANAGE BLADDER WITH ASSISTANCE Description: STG Manage Bladder With supervision  Assistance Outcome: Progressing   Problem: RH SKIN INTEGRITY Goal: RH STG SKIN FREE OF INFECTION/BREAKDOWN Description: Skin free of infection with supervision assistance Outcome: Progressing   Problem: RH SAFETY Goal: RH STG ADHERE TO SAFETY PRECAUTIONS W/ASSISTANCE/DEVICE Description: STG Adhere to Safety Precautions With supervision Assistance/Device. Outcome: Progressing   Problem: RH PAIN MANAGEMENT Goal: RH STG PAIN MANAGED AT OR BELOW PT'S PAIN GOAL Description: <4 w/ prn Outcome: Progressing   Problem: RH KNOWLEDGE DEFICIT Goal: RH STG INCREASE KNOWLEDGE OF DIABETES Description: Manage increase knowledge of diabetes with supervision assistance from wife using educational materials provided  Outcome: Progressing Goal: RH STG INCREASE KNOWLEDGE OF HYPERTENSION Description: Manage increase knowledge of hypertension with supervision assistance from wife using educational materials provided  Outcome: Progressing   "

## 2024-04-22 NOTE — Progress Notes (Signed)
 Occupational Therapy Session Note  Patient Details  Name: Larry Dawson MRN: [id number] Date of Birth: 1955-06-30  Today's Date: 04/22/2024 OT Individual Time: 9294-9189 OT Individual Time Calculation (min): 65 min   Today's Date: 04/22/2024 OT Individual Time: 1335-1440 OT Individual Time Calculation (min): 65 min   Short Term Goals: Week 1:  OT Short Term Goal 1 (Week 1): Pt will maintain dynamic sitting balance with CGA in preparation for ADL participation. OT Short Term Goal 2 (Week 1): Pt will maintain static standing balance with Max (x1) in preparation for ADL participation. OT Short Term Goal 3 (Week 1): Pt with complete grooming tasks with supervision + BUE integration.  Skilled Therapeutic Interventions/Progress Updates:   Session 1: Pt greeted resting in bed, increased cuing for aurosal due to early AM session. Pt with complaints of pain in RLE concentrated at inguinal area, calf, and ankle. Rest/positioning/support provided during session as needed. Pt transitions to EOB with Mod-Max A due to position in bed/soiled linen. UB care completed at EOB for dynamic sitting balance challenge, intermediate Min A + cues to correct R posterior lean. Bathing and dressing at Min A level, patient self-initiating integration of RUE into tasks, OT provides distal support to improve functional reach towards underarm. Stedy utilized for [employer] transport due to lack of +2 assistance, CGA provided, sitting balance on perch with SUP + BUE on stedy bar. Sitting in WC, patient completes LB care with OT providing assistance to cleanse distal BLE/thread LB garments due to difficulties with reaching forward without lateral LOB. Pt has difficulty with figure-4 technique due to stiffness. RN present to administer medication and assists with posterior pericare and hiking garments as patient and OT stand. Pt requires Max A for initial standing balance, improving towards Mod A time progressed. OT does block R-knee  to prevent buckling. Sink-side grooming with supervision, R-hand used as gross assist. Pt remained sitting in WC, posey belt activated, call bell within reach, and door open.   Session 2: Pt greeted sitting in recliner, no reports of pain this PM session. Stedy utilized for recliner<>WC transfers due to no +2 present, CGA for sit<>stand onto stedy. Pt dependently transported from room<>day room in [facility] for time/energy conservation. With use of standing frame pads (due to R-knee buckling), pt performs sit<>stands with heavy Mod A + cuing for forward weight-shift to prevent posterior bias. In standing, pt instructed in functional grasp/transport/release activity with RUE for NMR. Pt requires up to Mod A + multimodal cuing for glute activation and terminal hup extension. Pt with intermediate L lateral lean with fatigue. Sitting at table-top, pt completes 1 x 15 reps shoulder and elbow flexion/extension, shoulder abduction/adduction, and clockwise/counter-clockwise circumduction. Manual cueing required to prevent compensatory movements with his trunk and to assist with terminal end range during abduction. Pt then repeats functional grasp/transport/release activity with smaller objects, improved digital activation although movements continue to be slow/effortful. Pt remained in care of NT for toileting.   Therapy Documentation Precautions:  Precautions Precautions: Fall Recall of Precautions/Restrictions: Impaired Precaution/Restrictions Comments: R-hemiparesis/hemiplegia Restrictions Weight Bearing Restrictions Per Provider Order: No   Therapy/Group: Individual Therapy  Larry Dawson, OTR/L, MSOT  04/22/2024, 5:28 AM

## 2024-04-22 NOTE — Progress Notes (Addendum)
 "                                                        PROGRESS NOTE   Subjective/Complaints:  No events overnight. Some c/o R hip/groin pain, along with right ankle and calf pain since initial presentation and transport with some relation to clonus on admission.  Reviewed x-ray from 1-10, with some calcific tendinopathy but no fracture.  Did well in therapies yesterday. Vitals stable.  Last BM 1-12  ROS: Denies fevers, chills, N/V, abdominal pain, constipation, diarrhea, SOB, cough, chest pain, new weakness or paraesthesias.   + Right ankle and calf pain + Right groin pain  Objective:   No results found. Recent Labs    04/20/24 1449 04/21/24 0456  WBC 9.9 6.9  HGB 13.8 13.1  HCT 41.6 38.2*  PLT 301 277   Recent Labs    04/20/24 1449 04/21/24 0456  NA  --  138  K  --  3.7  CL  --  104  CO2  --  24  GLUCOSE  --  97  BUN  --  14  CREATININE 1.12 0.97  CALCIUM   --  9.3    Intake/Output Summary (Last 24 hours) at 04/22/2024 0806 Last data filed at 04/22/2024 0210 Gross per 24 hour  Intake 236 ml  Output 850 ml  Net -614 ml        Physical Exam: Vital Signs Blood pressure 124/68, pulse 62, temperature 98.5 F (36.9 C), temperature source Oral, resp. rate 20, height 5' 7 (1.702 m), weight 95.7 kg, SpO2 100%.  Physical Exam  Constitutional: No apparent distress. Appropriate appearance for age.  Sitting upright in wheelchair. HENT: Status post left basal craniotomy, site with minimal edema. Eyes: PERRLA.  Heavy nystagmus with left greater than rightward gaze, left glasses taped to improve diplopia. Cardiovascular: RRR, no murmurs/rub/gallops. No Edema. Peripheral pulses 2+  Respiratory: CTAB. No rales, rhonchi, or wheezing. On RA.  Abdomen: + bowel sounds, normoactive. No distention or tenderness.  Skin: Surgical site with staples intact, no apparent drainage, clean, dry.  MSK:      No apparent deformity. + TTP right medial ankle joint and over small area of  swelling/?vericose vein on right medial calf    + Right groin pain reproducible on internal rotation of the hip       Neurologic exam:  Cognition: AAO to person, place, time and event.  Language: Fluent, No substitutions or neoglisms. No dysarthria. Names 3/3 objects correctly.  Memory: Recalls 3/3 objects at 5 minutes. No apparent deficits  Insight: Good  insight into current condition.  Mood: Pleasant affect, appropriate mood.  Sensation: Reduced to light touch throughout the right hemibody Reflexes: 2+ in BL UE and LEs. Negative Hoffman's and babinski signs bilaterally.  CN: Left sided vision deficits.  Otherwise, intact. Coordination: Upper and lower extremity ataxia. Spasticity: MAS 0 in all extremities.       Strength:                RUE: 3/5 SA, 3/5 EF, 3/5 EE, 4/5 WE, 4/5 FF, 4/5 FA                LUE:  5/5 SA, 5/5 EF, 5/5 EE, 5/5 WE, 5/5 FF, 5/5 FA  RLE: 3/5 HF, 3/5 KE, 0/5  DF, 0/5  EHL, 4/5  PF                 LLE:  5/5 HF, 5/5 KE, 5/5  DF, 5/5  EHL, 5/5  PF    Assessment/Plan: 1. Functional deficits which require 3+ hours per day of interdisciplinary therapy in a comprehensive inpatient rehab setting. Physiatrist is providing close team supervision and 24 hour management of active medical problems listed below. Physiatrist and rehab team continue to assess barriers to discharge/monitor patient progress toward functional and medical goals  Care Tool:  Bathing    Body parts bathed by patient: Right arm, Chest, Abdomen, Front perineal area, Right upper leg, Left upper leg, Face   Body parts bathed by helper: Left arm, Right lower leg, Left lower leg, Buttocks     Bathing assist Assist Level: Moderate Assistance - Patient 50 - 74%     Upper Body Dressing/Undressing Upper body dressing   What is the patient wearing?: Pull over shirt    Upper body assist Assist Level: Minimal Assistance - Patient > 75%    Lower Body Dressing/Undressing Lower body  dressing      What is the patient wearing?: Pants     Lower body assist Assist for lower body dressing: 2 Helpers     Toileting Toileting    Toileting assist Assist for toileting: 2 Helpers     Transfers Chair/bed transfer  Transfers assist     Chair/bed transfer assist level: Dependent - mechanical lift     Locomotion Ambulation   Ambulation assist      Assist level: 2 helpers Assistive device: Gillie Max distance: 32'   Walk 10 feet activity   Assist     Assist level: 2 helpers Assistive device: Walker-Eva   Walk 50 feet activity   Assist Walk 50 feet with 2 turns activity did not occur: Safety/medical concerns         Walk 150 feet activity   Assist Walk 150 feet activity did not occur: Safety/medical concerns         Walk 10 feet on uneven surface  activity   Assist Walk 10 feet on uneven surfaces activity did not occur: Safety/medical concerns         Wheelchair     Assist Is the patient using a wheelchair?: Yes Type of Wheelchair: Manual    Wheelchair assist level: Maximal Assistance - Patient 25 - 49% Max wheelchair distance: 22'    Wheelchair 50 feet with 2 turns activity    Assist        Assist Level: Maximal Assistance - Patient 25 - 49%   Wheelchair 150 feet activity     Assist      Assist Level: Total Assistance - Patient < 25%   Blood pressure 124/68, pulse 62, temperature 98.5 F (36.9 C), temperature source Oral, resp. rate 20, height 5' 7 (1.702 m), weight 95.7 kg, SpO2 100%.    Medical Problem List and Plan: 1. 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 Janjua             -patient may  shower with shower cap             -ELOS/Goals: 14-16d -CGA to Min A goals - pending DC date  - Stable to continue inpatient rehab   - 1/13: Going home with 2 daughters, one is CNA. Min to Mod A  UB and Max A LB ADLs; poor  body orientation. Other evals pending.   2.  Antithrombotics: -DVT/anticoagulation:  Pharmaceutical: Heparin  initiated 04/14/2024             -antiplatelet therapy: N/A 3. Pain Management: Tylenol  as needed   -1-13 discussed positioning off of surgical site at nighttime.  Pain well-controlled on current regimen.  - 1-14: Voltaren  gel 4 times daily to right ankle and calf.  Discussed bracing with therapies.  Add baclofen  5 mg 3 times daily for developing tightness/muscle aching on the right hemibody.  Consider right hip x-ray for arthritis if groin pain becomes worse. 4. Mood/Behavior/Sleep: Provide emotional support             -antipsychotic agents: N/A   - sleeping well, apporpriate  5. Neuropsych/cognition: This patient is capable of making decisions on his own behalf. 6. Skin/Wound Care: Routine skin checks  - 1-14:   will reach out to Dr. [name] regarding DC staples POD 10 tomorrow  7. Fluids/Electrolytes/Nutrition: Routine and analysis with follow-up chemistries   - labs stable on admission 8.  Diabetes mellitus.  Hemoglobin A1c 7.2.  Glucophage  500 mg twice daily, Lantus  insulin  8 units nightly Recent Labs    04/21/24 1701 04/21/24 2116 04/22/24 0604  GLUCAP 95 114* 102*    -Blood sugars tightly controlled   9.  Hyperlipidemia.  Crestor  10.  Hypertension.  Norvasc  5 mg daily, Tenormin  50 mg daily, monitor with increased mobility -Blood pressure, vital stable.    04/22/2024    4:40 AM 04/21/2024    7:48 PM 04/21/2024    2:53 PM  Vitals with BMI  Systolic 124 128 890  Diastolic 68 73 70  Pulse 62 68 86    11.  Class II obesity.  BMI 35.29.  Dietary follow-up 12.  Constipation/IBS.  MiraLAX  daily, Senokot 1 tablet nightly   - 1/12 LBM  13.  COPD.  History of tobacco use.  Monitor oxygen saturations.  Discussed no nicotine. 14. Urinary urgency. Wean off purwick.    - no incontinence LOS: 2 days A FACE TO FACE EVALUATION WAS PERFORMED  Joesph JAYSON Likes 04/22/2024, 8:06  AM     "

## 2024-04-23 DIAGNOSIS — D1800 Hemangioma unspecified site: Secondary | ICD-10-CM | POA: Diagnosis not present

## 2024-04-23 LAB — GLUCOSE, CAPILLARY
Glucose-Capillary: 109 mg/dL — ABNORMAL HIGH (ref 70–99)
Glucose-Capillary: 111 mg/dL — ABNORMAL HIGH (ref 70–99)
Glucose-Capillary: 115 mg/dL — ABNORMAL HIGH (ref 70–99)
Glucose-Capillary: 91 mg/dL (ref 70–99)

## 2024-04-23 MED ORDER — POLYVINYL ALCOHOL 1.4 % OP SOLN: 1.0000 [drp] | OPHTHALMIC | Status: DC | PRN

## 2024-04-23 NOTE — Progress Notes (Signed)
 "                                                        PROGRESS NOTE   Subjective/Complaints:  No events overnight.  Patient doing fairly well this a.m., feels that Voltaren  gel has helped his right ankle significantly and having no more pain in the right groin today. Patient is somewhat frustrated by the fact that he still cannot walk as normal, did caution patience and reviewed timeline for stroke recovery as weeks to months. Vital stable. Blood sugars well-controlled Last bowel movement 1-12, continent of bowel bladder. Using as needed Tylenol  nightly  ROS: Denies fevers, chills, N/V, abdominal pain, constipation, diarrhea, SOB, cough, chest pain, new weakness or paraesthesias.   + Right ankle and calf pain--improved + Right groin pain--improved  Objective:   No results found. Recent Labs    04/20/24 1449 04/21/24 0456  WBC 9.9 6.9  HGB 13.8 13.1  HCT 41.6 38.2*  PLT 301 277   Recent Labs    04/20/24 1449 04/21/24 0456  NA  --  138  K  --  3.7  CL  --  104  CO2  --  24  GLUCOSE  --  97  BUN  --  14  CREATININE 1.12 0.97  CALCIUM   --  9.3    Intake/Output Summary (Last 24 hours) at 04/23/2024 1028 Last data filed at 04/23/2024 0839 Gross per 24 hour  Intake 630 ml  Output 300 ml  Net 330 ml        Physical Exam: Vital Signs Blood pressure 121/69, pulse 63, temperature 98.4 F (36.9 C), temperature source Oral, resp. rate 18, height 5' 7 (1.702 m), weight 95.7 kg, SpO2 99%.  Physical Exam  Constitutional: No apparent distress. Appropriate appearance for age.  Sitting upright in therapy gym. HENT: Status post left basal craniotomy, site with minimal edema. Eyes: PERRLA.  Heavy nystagmus with left greater than rightward gaze, left glasses taped to improve diplopia. Cardiovascular: RRR, no murmurs/rub/gallops. No Edema. Peripheral pulses 2+  Respiratory: CTAB. No rales, rhonchi, or wheezing. On RA.  Abdomen: + bowel sounds, normoactive. No distention or  tenderness.  Skin: Surgical site with staples intact, no apparent drainage, clean, dry.--Staples to be removed today with photos below.   MSK:      No apparent deformity. + Right ankle AFO       Neurologic exam:  Cognition: AAO to person, place, time and event.  Language: Fluent, No substitutions or neoglisms. No dysarthria. Names 3/3 objects correctly.  Memory:  No apparent deficits  Insight: Good  insight into current condition.  Mood: Pleasant affect, appropriate mood.  Sensation: Reduced to light touch throughout the right hemibody Reflexes: 2+ in BL UE and LEs. Negative Hoffman's and babinski signs bilaterally.  CN: Left sided vision deficits.  Otherwise, intact. Coordination: Upper and lower extremity ataxia. Spasticity: MAS 0 in all extremities.       Strength:                RUE: 3/5 SA, 3/5 EF, 3/5 EE, 4/5 WE, 4/5 FF, 4/5 FA                LUE:  5/5 SA, 5/5 EF, 5/5 EE, 5/5 WE, 5/5 FF, 5/5 FA  RLE: 3/5 HF, 3/5 KE, 0/5  DF, 0/5  EHL, 4/5  PF                 LLE:  5/5 HF, 5/5 KE, 5/5  DF, 5/5  EHL, 5/5  PF  Physical exam unchanged from the above on reexamination 04/23/24    Assessment/Plan: 1. Functional deficits which require 3+ hours per day of interdisciplinary therapy in a comprehensive inpatient rehab setting. Physiatrist is providing close team supervision and 24 hour management of active medical problems listed below. Physiatrist and rehab team continue to assess barriers to discharge/monitor patient progress toward functional and medical goals  Care Tool:  Bathing    Body parts bathed by patient: Right arm, Chest, Abdomen, Front perineal area, Right upper leg, Left upper leg, Face   Body parts bathed by helper: Left arm, Right lower leg, Left lower leg, Buttocks     Bathing assist Assist Level: Moderate Assistance - Patient 50 - 74%     Upper Body Dressing/Undressing Upper body dressing   What is the patient wearing?: Pull over shirt    Upper  body assist Assist Level: Minimal Assistance - Patient > 75%    Lower Body Dressing/Undressing Lower body dressing      What is the patient wearing?: Pants     Lower body assist Assist for lower body dressing: 2 Helpers     Toileting Toileting    Toileting assist Assist for toileting: 2 Helpers     Transfers Chair/bed transfer  Transfers assist     Chair/bed transfer assist level: Dependent - mechanical lift     Locomotion Ambulation   Ambulation assist      Assist level: 2 helpers Assistive device: Gillie Max distance: 32'   Walk 10 feet activity   Assist     Assist level: 2 helpers Assistive device: Walker-Eva   Walk 50 feet activity   Assist Walk 50 feet with 2 turns activity did not occur: Safety/medical concerns         Walk 150 feet activity   Assist Walk 150 feet activity did not occur: Safety/medical concerns         Walk 10 feet on uneven surface  activity   Assist Walk 10 feet on uneven surfaces activity did not occur: Safety/medical concerns         Wheelchair     Assist Is the patient using a wheelchair?: Yes Type of Wheelchair: Manual    Wheelchair assist level: Maximal Assistance - Patient 25 - 49% Max wheelchair distance: 61'    Wheelchair 50 feet with 2 turns activity    Assist        Assist Level: Maximal Assistance - Patient 25 - 49%   Wheelchair 150 feet activity     Assist      Assist Level: Total Assistance - Patient < 25%   Blood pressure 121/69, pulse 63, temperature 98.4 F (36.9 C), temperature source Oral, resp. rate 18, height 5' 7 (1.702 m), weight 95.7 kg, SpO2 99%.    Medical Problem List and Plan: 1. 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 Janjua             -patient may  shower with shower cap             -ELOS/Goals: 14-16d -CGA to Min A goals - pending DC date  - Stable to continue  inpatient rehab   - 1/13: Going  home with 2 daughters, one is CNA. Min to Mod A UB and Max A LB ADLs; poor body orientation. Other evals pending.   2.  Antithrombotics: -DVT/anticoagulation:  Pharmaceutical: Heparin  initiated 04/14/2024             -antiplatelet therapy: N/A 3. Pain Management: Tylenol  as needed   -1-13 discussed positioning off of surgical site at nighttime.  Pain well-controlled on current regimen.  - 1-14: Voltaren  gel 4 times daily to right ankle and calf.  Discussed bracing with therapies.  Add baclofen  5 mg 3 times daily for developing tightness/muscle aching on the right hemibody.  Consider right hip x-ray for arthritis if groin pain becomes worse.  1/15: Pain much improved with current regimen, monitor  4. Mood/Behavior/Sleep: Provide emotional support             -antipsychotic agents: N/A   - sleeping well, apporpriate  5. Neuropsych/cognition: This patient is capable of making decisions on his own behalf. 6. Skin/Wound Care: Routine skin checks  - 1-14:   will reach out to Dr. [name] regarding DC staples POD 10 tomorrow--OK, order placed 1/15  7. Fluids/Electrolytes/Nutrition: Routine and analysis with follow-up chemistries   - labs stable on admission 8.  Diabetes mellitus.  Hemoglobin A1c 7.2.  Glucophage  500 mg twice daily, Lantus  insulin  8 units nightly Recent Labs    04/22/24 1636 04/22/24 2117 04/23/24 0557  GLUCAP 87 126* 109*    -Blood sugars tightly controlled   9.  Hyperlipidemia.  Crestor  10.  Hypertension.  Norvasc  5 mg daily, Tenormin  50 mg daily, monitor with increased mobility -Blood pressure, vital stable.    04/23/2024    3:37 AM 04/22/2024    7:59 PM 04/22/2024    1:40 PM  Vitals with BMI  Systolic 121 110 874  Diastolic 69 68 64  Pulse 63 74 65    11.  Class II obesity.  BMI 35.29.  Dietary follow-up 12.  Constipation/IBS.  MiraLAX  daily, Senokot 1 tablet nightly   - 1/12 LBM--may need laxatives increase tomorrow if no  improvement  13.  COPD.  History of tobacco use.  Monitor oxygen saturations.  Discussed no nicotine. 14. Urinary urgency. Wean off purwick.    - no incontinence  15. Left vision deficits.  Continue glasses taping, therapies.  LOS: 3 days A FACE TO FACE EVALUATION WAS PERFORMED  Joesph JAYSON Likes 04/23/2024, 10:28 AM     "

## 2024-04-23 NOTE — Progress Notes (Signed)
 Occupational Therapy Session Note  Patient Details  Name: Larry Dawson MRN: [id number] Date of Birth: 1956/03/18  Today's Date: 04/23/2024 OT Individual Time: 1117-1208 OT Individual Time Calculation (min): 51 min    Short Term Goals: Week 1:  OT Short Term Goal 1 (Week 1): Pt will maintain dynamic sitting balance with CGA in preparation for ADL participation. OT Short Term Goal 2 (Week 1): Pt will maintain static standing balance with Max (x1) in preparation for ADL participation. OT Short Term Goal 3 (Week 1): Pt with complete grooming tasks with supervision + BUE integration.  Skilled Therapeutic Interventions/Progress Updates:  Pt greeted seated in w/c, pt agreeable to OT intervention.      Transfers/bed mobility/functional mobility: pt completed sit>stands in // bars with R knee blocked with MIN A- CGA. Practiced walking in bars with pt needing MODA to manage RLE from scissoring and to block R knee, no buckling noted. Pt needed max cues for glute/quad activation and to sequence steps. +2 provided for close chair follow.   At the end of session pt requested to stay in recliner, pt able to stand to stedy with CGA. Dependent transfer into recliner from stedy.   Therapeutic activity:  Pt completed dynamic standing balance task in stedy with pt instructed to reach with RUE to remove/place clothespins on net of basketball goal. Pt completed task with CGA and increased time to motor plan RUE reaching.   Pt completed Seated reaching task with RUE with pt instructed to grasp bean bags and place bags in target placed anteriorly. Pt completed task with CGA but MOD cues for set- up and technique.                 Ended session with pt seated in recliner with all needs within reach and safety belt alarm activated.                    Therapy Documentation Precautions:  Precautions Precautions: Fall Recall of Precautions/Restrictions: Impaired Precaution/Restrictions Comments:  R-hemiparesis/hemiplegia Restrictions Weight Bearing Restrictions Per Provider Order: No  Pain: No pain    Therapy/Group: Individual Therapy  Ronal Mallie Needy 04/23/2024, 3:36 PM

## 2024-04-23 NOTE — Progress Notes (Signed)
 Staples Removed from left side scalp. Pt tolerated well.

## 2024-04-23 NOTE — Progress Notes (Signed)
 Speech Language Pathology Daily Session Note  Patient Details  Name: Larry Dawson MRN: [id number] Date of Birth: 1955-08-26  Today's Date: 04/23/2024 SLP Individual Time: 9169-9068 SLP Individual Time Calculation (min): 61 min  Short Term Goals: Week 1: SLP Short Term Goal 1 (Week 1): Patient will selectively attend to therapy tasks for 20 minutes given supervision assist. SLP Short Term Goal 2 (Week 1): Patient will solve complex functional problems with supervision assist. SLP Short Term Goal 3 (Week 1): Patient will utilize SLOP speech intelligibility strategies at the conversation level with supervision assist. SLP Short Term Goal 4 (Week 1): Patient will tolerate regular/thin liquid diet with supervision assist for use of safe swallowing strategies.  Skilled Therapeutic Interventions: SLP conducted skilled therapy session targeting cognitive and communication goals. Patient recalled events completed in previous day's therapy sessions independently, though mildly tangential during discussion requiring min assist for selective attention due to distraction of television show. SLP facilitated moderately complex deductive reasoning puzzle where patient benefited from min assist for attention to detail, deductive reasoning, and supervision for task organization. SLP then facilitated mildly complex scanning/attention to detail task where patient sorted through letter box to locate target sequences with supervision assist and adequate processing time. During session, patient utilized speech intelligibility strategies at the unstructured conversation level to achieve 100% intelligibility with modI! Patient was left in room with call bell in reach and alarm set. SLP will continue to target goals per plan of care.        Pain Pain Assessment Pain Scale: 0-10 Pain Score: 3  Pain Location: Head Pain Intervention(s): Medication (See eMAR)  Therapy/Group: Individual Therapy  Keelee Yankey A  Alfonso Shackett 04/23/2024, 9:32 AM

## 2024-04-23 NOTE — Progress Notes (Signed)
 Physical Therapy Session Note  Patient Details  Name: Larry Dawson MRN: [id number] Date of Birth: 28-Aug-1955  Today's Date: 04/23/2024 PT Individual Time: 1005-1045 PT Individual Time Calculation (min): 40 min   Short Term Goals: Week 1:  PT Short Term Goal 1 (Week 1): Pt will complete transfers with min assist and LRAD PT Short Term Goal 2 (Week 1): Pt will ambulate 29' with LRAD and mod assist PT Short Term Goal 3 (Week 1): Pt will complete up/down 1 step with BHRs with mod assist  Skilled Therapeutic Interventions/Progress Updates:    Pt presents in room in bed with nurse tech assisting with bathing, pt agreeable to PT. Pt handoff from nurse tech in supine, therapist assist with donning pants in supine with max assist, therapist dons shoes and GRAFO on RLE total assist for time management. Pt completes bed mobility with max assist from flat bed with cues for sequencing with pt demonstrating difficulty coordinating R side and core for bed mobility. Pt completes squat pivot transfer to R with max assist x2 due to excessive R lateropulsion and retropulsion during transfer. Pt transported to hallway outside of main gym and participates with gait training with handrail on L side, heavy mod assist for postural stability with max cues for L lateral weightshift into wall, RLE foot placement, step length modification bilaterally, straightening R knee, ambulating 30' with +2 WC follow for safety. Pt demonstrates R knee flexed in stance but no buckling noted with R GRAFO donned however pt noted to demonstrate R eversion with initial contact with mod assist required to correct. Pt then completes NMR 2 steps forward 2 steps backward with L HR heavy mod assist. Pt then completes gait training with RW with continued cues for L lateral weightshift into wall on L side, requires max assist and +2 for first trial 5', then requires heavy mod assist for 2nd trial 20' with addition of R hemi hand splint added and max  cues for sequencing RW  and stepping, +2 WC follow for safety. Pt returns to room and remains seated in [facility] with all needs within reach, cal light in place and chair alarm donned and NT present at end of session.   Therapy Documentation Precautions:  Precautions Precautions: Fall Recall of Precautions/Restrictions: Impaired Precaution/Restrictions Comments: R-hemiparesis/hemiplegia Restrictions Weight Bearing Restrictions Per Provider Order: No   Therapy/Group: Individual Therapy  Reche Ohara PT, DPT 04/23/2024, 4:41 PM

## 2024-04-23 NOTE — Group Note (Signed)
 Patient Details Name: Larry Dawson MRN: [id number] DOB: 02-06-1956 Today's Date: 04/23/2024  Time Calculation: OT Group Time Calculation OT Group Start Time: 1400 OT Group Stop Time: 1500 OT Group Time Calculation (min): 60 min      Group Description: Dance Group: Pt participated in dance group with an emphasis on social interaction, motor planning, increasing overall activity tolerance and bimanual tasks. All songs were selected by group members. Dance moves included AROM of BUE/BLE gross motor movements with an emphasis on building functional endurance.   Individual level documentation: Patient completed group from sitting level. Patientt needed supervision to complete various dance moves with cues for safety and R U/LE positioning.  Patient needed min modifications during group.  Pain:  0/10  Precautions:  falls  Larry Dawson 04/23/2024, 4:01 PM

## 2024-04-23 NOTE — IPOC Note (Signed)
 Overall Plan of Care [facility]) Patient Details Name: Larry Dawson MRN: [id number] DOB: 06-Nov-1955  Admitting Diagnosis: Cavernoma  Hospital Problems: Principal Problem:   Cavernoma     Functional Problem List: Nursing Bladder, Bowel, Edema, Endurance, Safety, Skin Integrity  PT Balance, Behavior, Endurance, Motor, Safety, Pain, Sensory, Perception  OT Balance, Cognition, Endurance, Motor, Safety, Perception, Vision  SLP Motor, Cognition  TR         Basic ADLs: OT Bathing, Dressing, Toileting     Advanced  ADLs: OT       Transfers: PT Bed Mobility, Bed to Chair, Car, Occupational Psychologist, Research Scientist (life Sciences): PT Ambulation, Stairs, Psychologist, [employer]     Additional Impairments: OT Fuctional Use of Upper Extremity  SLP Social Cognition   Problem Solving, Attention  TR      Anticipated Outcomes Item Anticipated Outcome  Self Feeding    Swallowing  modI   Basic self-care  Min A  Toileting  Min A   Bathroom Transfers CGA  Bowel/Bladder  anage bowels with medications/ manage bladder with toileting assistance  Transfers  supervision  Locomotion  supervision ambulatory  Communication  modI  Cognition  modI  Pain  <4 w/ prn  Safety/Judgment  manage safety with supervision assistance   Therapy Plan: PT Intensity: Minimum of 1-2 x/day ,45 to 90 minutes PT Frequency: 5 out of 7 days PT Duration Estimated Length of Stay: 3 weeks OT Intensity: Minimum of 1-2 x/day, 45 to 90 minutes OT Frequency: 5 out of 7 days OT Duration/Estimated Length of Stay: ~3 weeks SLP Intensity: Minumum of 1-2 x/day, 30 to 90 minutes SLP Frequency: 1 to 3 out of 7 days SLP Duration/Estimated Length of Stay: 2-2.5 weeks   Team Interventions: Nursing Interventions Patient/Family Education, Skin Care/Wound Management, Bladder Management, Disease Management/Prevention, Medication Management, Discharge Planning  PT interventions Ambulation/gait training, Community  reintegration, DME/adaptive equipment instruction, Neuromuscular re-education, Psychosocial support, Stair training, UE/LE Strength taining/ROM, Wheelchair propulsion/positioning, Warden/ranger, Discharge planning, Functional electrical stimulation, Pain management, Therapeutic Activities, UE/LE Coordination activities, Cognitive remediation/compensation, Disease management/prevention, Functional mobility training, Patient/family education, Splinting/orthotics, Therapeutic Exercise, Visual/perceptual remediation/compensation  OT Interventions Balance/vestibular training, Discharge planning, DME/adaptive equipment instruction, Functional electrical stimulation, Functional mobility training, Neuromuscular re-education, Pain management, Patient/family education, Psychosocial support, Self Care/advanced ADL retraining, Skin care/wound managment, Splinting/orthotics, Therapeutic Exercise, Therapeutic Activities, UE/LE Strength taining/ROM, UE/LE Coordination activities, Visual/perceptual remediation/compensation, Wheelchair propulsion/positioning  SLP Interventions Cognitive remediation/compensation, Speech/Language facilitation, Cueing hierarchy, Functional tasks, Therapeutic Activities, Internal/external aids, Patient/family education  TR Interventions    SW/CM Interventions     Barriers to Discharge MD  Medical stability, Home enviroment access/loayout, and Wound care  Nursing Decreased caregiver support, Home environment access/layout, Incontinence Discharge: House  Discharge Home Layout: Two level, Bed/bath upstairs  Alternate Level Stairs-Rails: Right  Alternate Level Stairs-Number of Steps: flight  Discharge Home Access: Stairs to enter  Entrance Stairs-Rails: Right  Entrance Stairs-Number of Steps: 4  PT Inaccessible home environment, Decreased caregiver support, Home environment access/layout 4 STE, caregiver support not sufficient for pt current functional abilities  OT Home  environment access/layout, Wound Care    SLP      SW       Team Discharge Planning: Destination: PT-Home ,OT- Home , SLP-Home Projected Follow-up: PT-Outpatient PT, OT-  Outpatient OT, SLP-Outpatient SLP Projected Equipment Needs: PT-To be determined, OT- To be determined, SLP-None recommended by SLP Equipment Details: PT- , OT-  Patient/family involved in discharge planning: PT- Patient,  OT-Patient, SLP-Patient  MD ELOS: 14-16 days Medical Rehab Prognosis:  Good Assessment: The patient has been admitted for CIR therapies with the diagnosis of cavernoma s/p L craniotomy. The team will be addressing functional mobility, strength, stamina, balance, safety, adaptive techniques and equipment, self-care, bowel and bladder mgt, patient and caregiver education,  . Goals have been set at contact guard assist PT, OT. Anticipated discharge destination is home.       See Team Conference Notes for weekly updates to the plan of care

## 2024-04-24 DIAGNOSIS — D1800 Hemangioma unspecified site: Secondary | ICD-10-CM | POA: Diagnosis not present

## 2024-04-24 LAB — GLUCOSE, CAPILLARY
Glucose-Capillary: 109 mg/dL — ABNORMAL HIGH (ref 70–99)
Glucose-Capillary: 104 mg/dL — ABNORMAL HIGH (ref 70–99)
Glucose-Capillary: 103 mg/dL — ABNORMAL HIGH (ref 70–99)
Glucose-Capillary: 76 mg/dL (ref 70–99)

## 2024-04-24 MED ORDER — CALCIUM CARBONATE ANTACID 500 MG PO CHEW
1.0000 | CHEWABLE_TABLET | ORAL | Status: DC | PRN
  Administered 2024-04-24 – 2024-05-04 (×3): 200 mg via ORAL
  Filled 2024-04-24 (×3): qty 1

## 2024-04-24 MED ORDER — SORBITOL 70 % SOLN: 30.0000 mL | Freq: Every day | Status: DC | PRN

## 2024-04-24 MED ORDER — FAMOTIDINE 20 MG PO TABS
40.0000 mg | ORAL_TABLET | Freq: Every day | ORAL | Status: DC
  Administered 2024-04-25 – 2024-05-15 (×21): 40 mg via ORAL
  Filled 2024-04-24 (×21): qty 2

## 2024-04-24 MED ORDER — DOCUSATE SODIUM 100 MG PO CAPS
100.0000 mg | ORAL_CAPSULE | Freq: Two times a day (BID) | ORAL | Status: DC
  Administered 2024-04-24 – 2024-05-07 (×27): 100 mg via ORAL
  Filled 2024-04-24 (×27): qty 1

## 2024-04-24 NOTE — Progress Notes (Signed)
 " Inpatient Rehabilitation Care Coordinator Assessment and Plan Patient Details  Name: Larry Dawson MRN: [id number] Date of Birth: [birth year]  Today's Date: 04/24/2024  Hospital Problems: Principal Problem:   Cavernoma  Past Medical History:  Past Medical History:  Diagnosis Date   Allergy    Anal fissure    COPD (chronic obstructive pulmonary disease) (HCC)    Albuterol  PRN.   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 His smoking use included cigarettes. He has never used smokeless tobacco. He reports that he does not currently use alcohol . He reports that he does not use drugs.  Family / Support Systems Marital Status: Married How Long?: 44 years Patient Roles: Spouse Spouse/Significant Other: Cassie (wife) Children: 3 daughters- Rosaline Murray, Brittany Other Supports: PRN support form daughters Murray and Brittany Anticipated Caregiver: wife Ability/Limitations of Caregiver: Pt will dc to home with his wife. Caregiver Availability: 24/7 Family Dynamics: Pt lives with his wife  Social  History Preferred language: English Religion: [employer] Cultural Background: Pt is not a cytogeneticist. 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 Education: some Charity Fundraiser - How often do you need to have someone help you when you read instructions, pamphlets, or other written material from your doctor or pharmacy?: Never Writes: Yes Employment Status: Retired Date Retired/Disabled/Unemployed: semi-retired Marine Scientist Issues: Denies Guardian/Conservator: HCPOA-wife   Abuse/Neglect Abuse/Neglect Assessment Can Be Completed: Yes Physical Abuse: Denies Verbal Abuse: Denies Sexual Abuse: Denies Exploitation of patient/patient's resources: Denies Self-Neglect: Denies  Patient response to: Social Isolation - How often do you feel lonely or isolated from those around you?: Never  Emotional Status Pt's affect, behavior and adjustment status: Pt in good spirits at time of visit. Recent Psychosocial Issues: Denies Psychiatric History: Denies Substance Abuse History: Pt reports occasional etoh use. Denies any rec drug or tobacco product use.  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  Community Resources [name]: None Premorbid Home Care/DME Agencies: None Transportation available at discharge: TBD Is the patient able to respond to transportation needs?: Yes In the past 12 months, has lack of transportation kept you from medical appointments or from getting medications?: No In the past 12 months, has  lack of transportation kept you from meetings, work, or from getting things needed for daily living?: No Resource referrals recommended: Neuropsychology  Discharge Planning Living Arrangements: Spouse/significant other Support Systems: Spouse/significant other, Children Type of Residence: Private residence Insurance Resources: Media Planner (specify) ([employer] Medicare) Financial Resources: Tree Surgeon, Employment Financial Screen Referred: No Living Expenses: Banker Management: Patient, Spouse Does the patient have any problems obtaining your medications?: No Home Management: Pt reports that his wife manages all home care needs and he did outside yard work. Patient/Family Preliminary Plans: TBD Care Coordinator Barriers to Discharge: Decreased caregiver support, Lack of/limited family support, Insurance for SNF coverage Care Coordinator Anticipated Follow Up Needs: HH/OP Expected length of stay: ELOS 2-3 weeks  Clinical Impression Late entry.   SW met with pt in room to introduce self, explain role, and discuss discharge process. Pt is not a cytogeneticist. HCPOA- wife Cassie. DME- cane and unsure if he has a RW. Aware SW will follow-up with his wife.  Darneisha Windhorst A Aaralynn Shepheard 04/24/2024, 2:00 PM    "

## 2024-04-24 NOTE — Progress Notes (Signed)
 Physical Therapy Session Note  Patient Details  Name: Larry Dawson MRN: [id number] Date of Birth: [birth year]  Today's Date: 04/24/2024 PT Individual Time: 1047-1200 PT Individual Time Calculation (min): 73 min   Short Term Goals: Week 1:  PT Short Term Goal 1 (Week 1): Pt will complete transfers with min assist and LRAD PT Short Term Goal 2 (Week 1): Pt will ambulate 55' with LRAD and mod assist PT Short Term Goal 3 (Week 1): Pt will complete up/down 1 step with BHRs with mod assist  Skilled Therapeutic Interventions/Progress Updates: Pt presents sitting in w/c and agreeable to therapy.  Pt wheeled to main gym.  Pt performed sit to stand and squat pivot w/c > mat table w/ mod A.  Pt transfers sit to supine w/ mod A and reviewed HEP.  Pt performed QS, bridging w/ manual A to maintain hooklying, supine marching AAROM for RLE (c/o soreness R groin), and SLR w/ AAROM RLE.  Pt transferred sup to sit w/ mod Abut pt impulsivey initiates w/o waiting for cueing.  Pt returns to w/c to L w/ mod A and cues.  Pt amb w/ RW and max A and manual A for placement of RLE, cues for 1 step movement and weight shift to L.  Shoe cap applied to RLE.  Pt amb x 30'  x 4.  Pt remained sitting in w/c w/ chair alarm on and all needs in reach.     Therapy Documentation Precautions:  Precautions Precautions: Fall Recall of Precautions/Restrictions: Impaired Precaution/Restrictions Comments: R-hemiparesis/hemiplegia Restrictions Weight Bearing Restrictions Per Provider Order: No General:   Vital Signs:   Pain:0/10 Pain Assessment Pain Scale: 0-10 Pain Score: 2  Pain Location: Head Pain Intervention(s): Medication (See eMAR)    Therapy/Group: Individual Therapy  [name] 04/24/2024, 12:18 PM

## 2024-04-24 NOTE — Progress Notes (Signed)
 "                                                        PROGRESS NOTE   Subjective/Complaints:  No events overnight.  Patient doing well today. Patient complaining today of acid reflux, states he takes Tums and 3 purple and white pills a morning every day.  Gets these over-the-counter, does not know if they are Protonix  or Pepcid .  Right groin and ankle pain improved  ROS: Denies fevers, chills, N/V, abdominal pain, constipation, diarrhea, SOB, cough, chest pain, new weakness or paraesthesias.   + Right ankle and calf pain--improved + Right groin pain--improved  Objective:   No results found. No results for input(s): WBC, HGB, HCT, PLT in the last 72 hours.  No results for input(s): NA, K, CL, CO2, GLUCOSE, BUN, CREATININE, CALCIUM  in the last 72 hours.   Intake/Output Summary (Last 24 hours) at 04/24/2024 1700 Last data filed at 04/24/2024 1330 Gross per 24 hour  Intake 420 ml  Output 600 ml  Net -180 ml        Physical Exam: Vital Signs Blood pressure 103/74, pulse 66, temperature 97.8 F (36.6 C), temperature source Oral, resp. rate 18, height 5' 7 (1.702 m), weight 95.7 kg, SpO2 100%.  Physical Exam  Constitutional: No apparent distress. Appropriate appearance for age.  Sitting upright in therapy gym. HENT: Status post left basal craniotomy, site with minimal edema. Eyes: PERRLA.  Heavy nystagmus with left greater than rightward gaze, left glasses taped to improve diplopia. Cardiovascular: RRR, no murmurs/rub/gallops. No Edema. Peripheral pulses 2+  Respiratory: CTAB. No rales, rhonchi, or wheezing. On RA.  Abdomen: + bowel sounds, normoactive. No distention or tenderness.  Skin: Posterior surgical site appears to be healing well, no drainage.  Lower portions of incision are not equally joined at the outer edges, although inner part of the suture line has healed and there is no visible wound bed.  Area is reinforced with Steri-Strips as below  1-16.   MSK:      No apparent deformity. + Right ankle AFO       Neurologic exam:  Cognition: AAO to person, place, time and event.  Language: Fluent, No substitutions or neoglisms. No dysarthria. Names 3/3 objects correctly.  Memory:  No apparent deficits  Insight: Good  insight into current condition.  Mood: Pleasant affect, appropriate mood.  Sensation: Reduced to light touch throughout the right hemibody Reflexes: 2+ in BL UE and LEs. Negative Hoffman's and babinski signs bilaterally.  CN: Left sided vision deficits.  Otherwise, intact. Coordination: Upper and lower extremity ataxia. Spasticity: MAS 0 in all extremities.       Strength:                RUE: 3/5 SA, 3/5 EF, 3/5 EE, 4/5 WE, 4/5 FF, 4/5 FA                LUE:  5/5 SA, 5/5 EF, 5/5 EE, 5/5 WE, 5/5 FF, 5/5 FA                RLE: 3/5 HF, 3/5 KE, 0/5  DF, 0/5  EHL, 4/5  PF                 LLE:  5/5 HF, 5/5 KE, 5/5  DF, 5/5  EHL, 5/5  PF    Assessment/Plan: 1. Functional deficits which require 3+ hours per day of interdisciplinary therapy in a comprehensive inpatient rehab setting. Physiatrist is providing close team supervision and 24 hour management of active medical problems listed below. Physiatrist and rehab team continue to assess barriers to discharge/monitor patient progress toward functional and medical goals  Care Tool:  Bathing    Body parts bathed by patient: Right arm, Chest, Abdomen, Front perineal area, Right upper leg, Left upper leg, Face   Body parts bathed by helper: Left arm, Right lower leg, Left lower leg, Buttocks     Bathing assist Assist Level: Moderate Assistance - Patient 50 - 74%     Upper Body Dressing/Undressing Upper body dressing   What is the patient wearing?: Pull over shirt    Upper body assist Assist Level: Minimal Assistance - Patient > 75%    Lower Body Dressing/Undressing Lower body dressing      What is the patient wearing?: Pants     Lower body assist Assist  for lower body dressing: 2 Helpers     Toileting Toileting    Toileting assist Assist for toileting: 2 Helpers     Transfers Chair/bed transfer  Transfers assist     Chair/bed transfer assist level: Moderate Assistance - Patient 50 - 74%     Locomotion Ambulation   Ambulation assist      Assist level: 2 helpers Assistive device: Other (comment) (hand rail) Max distance: 30   Walk 10 feet activity   Assist     Assist level: 2 helpers Assistive device: Other (comment) (hand rail)   Walk 50 feet activity   Assist Walk 50 feet with 2 turns activity did not occur: Safety/medical concerns         Walk 150 feet activity   Assist Walk 150 feet activity did not occur: Safety/medical concerns         Walk 10 feet on uneven surface  activity   Assist Walk 10 feet on uneven surfaces activity did not occur: Safety/medical concerns         Wheelchair     Assist Is the patient using a wheelchair?: Yes Type of Wheelchair: Manual    Wheelchair assist level: Maximal Assistance - Patient 25 - 49% Max wheelchair distance: 27'    Wheelchair 50 feet with 2 turns activity    Assist        Assist Level: Maximal Assistance - Patient 25 - 49%   Wheelchair 150 feet activity     Assist      Assist Level: Total Assistance - Patient < 25%   Blood pressure 103/74, pulse 66, temperature 97.8 F (36.6 C), temperature source Oral, resp. rate 18, height 5' 7 (1.702 m), weight 95.7 kg, SpO2 100%.    Medical Problem List and Plan: 1. 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 Janjua             -patient may  shower with shower cap             -ELOS/Goals: 14-16d -CGA to Min A goals - pending DC date  - Stable to continue inpatient rehab   - 1/13: Going home with 2 daughters, one is CNA. Min to Mod A UB and Max A LB ADLs; poor body orientation. Other evals pending.    2.  Antithrombotics: -DVT/anticoagulation:  Pharmaceutical: Heparin  initiated 04/14/2024             -  antiplatelet therapy: N/A 3. Pain Management: Tylenol  as needed   -1-13 discussed positioning off of surgical site at nighttime.  Pain well-controlled on current regimen.  - 1-14: Voltaren  gel 4 times daily to right ankle and calf.  Discussed bracing with therapies.  Add baclofen  5 mg 3 times daily for developing tightness/muscle aching on the right hemibody.  Consider right hip x-ray for arthritis if groin pain becomes worse.  1/15: Pain much improved with current regimen, monitor  4. Mood/Behavior/Sleep: Provide emotional support             -antipsychotic agents: N/A   - sleeping well, apporpriate  5. Neuropsych/cognition: This patient is capable of making decisions on his own behalf. 6. Skin/Wound Care: Routine skin checks  - 1-14:   will reach out to Dr. [name] regarding DC staples POD 10 tomorrow--OK, order placed 1/15  1-16: Some uneven edges on the surgical line in the lower portion, reinforced with Steri-Strips today.  No drainage or open wound bed, no concern for dehiscence or infection.  7. Fluids/Electrolytes/Nutrition: Routine and analysis with follow-up chemistries   - labs stable on admission 8.  Diabetes mellitus.  Hemoglobin A1c 7.2.  Glucophage  500 mg twice daily, Lantus  insulin  8 units nightly Recent Labs    04/23/24 2103 04/24/24 0538 04/24/24 1202  GLUCAP 91 109* 104*    -Blood sugars tightly controlled   9.  Hyperlipidemia.  Crestor  10.  Hypertension.  Norvasc  5 mg daily, Tenormin  50 mg daily, monitor with increased mobility -Blood pressure, vital stable.    04/24/2024    2:41 PM 04/24/2024    3:56 AM 04/23/2024    7:29 PM  Vitals with BMI  Systolic 103 122 884  Diastolic 74 51 68  Pulse 66 64 67    11.  Class II obesity.  BMI 35.29.  Dietary follow-up 12.  Constipation/IBS.  MiraLAX  daily, Senokot 1 tablet nightly   - 1/12 LBM--may need laxatives  increase tomorrow if no improvement  13.  COPD.  History of tobacco use.  Monitor oxygen saturations.  Discussed no nicotine. 14. Urinary urgency. Wean off purwick.    - no incontinence  15. Left vision deficits.  Continue glasses taping, therapies.  16.  Gastric reflux.  Resume Pepcid  40 mg daily, Tums 3 times daily as needed  LOS: 4 days A FACE TO FACE EVALUATION WAS PERFORMED  Larry Dawson Likes 04/24/2024, 5:00 PM     "

## 2024-04-24 NOTE — Progress Notes (Signed)
 Occupational Therapy Session Note  Patient Details  Name: Larry Dawson MRN: [id number] Date of Birth: 05-10-1955  Today's Date: 04/24/2024 OT Individual Time: 8699-8651 OT Individual Time Calculation (min): 48 min    Short Term Goals: Week 1:  OT Short Term Goal 1 (Week 1): Pt will maintain dynamic sitting balance with CGA in preparation for ADL participation. OT Short Term Goal 2 (Week 1): Pt will maintain static standing balance with Max (x1) in preparation for ADL participation. OT Short Term Goal 3 (Week 1): Pt with complete grooming tasks with supervision + BUE integration.  Skilled Therapeutic Interventions/Progress Updates:   Patient agreeable to participate in OT session. Reports 0/10 pain level.   Patient participated in skilled OT session focusing on transfer training, dynamic sitting/ scooting, LE strengthening/ coordination. Patient received in room ready for OT. Patient transported to gym via wc with total for time management. Patient then completed hip flexion with OT assist on RLE to prime ability to pivot when completing stand/ squat pivot. Patient then completed transfer training with OT providing tactile, verbal cueing. Patient required max A for stand pivot to mat table from wc x6, in both directions. Patient then returned to room all needs in reach alarm on.   Therapy Documentation Precautions:  Precautions Precautions: Fall Recall of Precautions/Restrictions: Impaired Precaution/Restrictions Comments: R-hemiparesis/hemiplegia Restrictions Weight Bearing Restrictions Per Provider Order: No   Therapy/Group: Individual Therapy  D'mariea L Alecsander Hattabaugh 04/24/2024, 2:05 PM

## 2024-04-24 NOTE — Progress Notes (Signed)
 Physical Therapy Session Note  Patient Details  Name: Larry Dawson MRN: [id number] Date of Birth: 10-May-1955  Today's Date: 04/24/2024 PT Individual Time: 9164-9054 PT Individual Time Calculation (min): 70 min   Short Term Goals: Week 1:  PT Short Term Goal 1 (Week 1): Pt will complete transfers with min assist and LRAD PT Short Term Goal 2 (Week 1): Pt will ambulate 22' with LRAD and mod assist PT Short Term Goal 3 (Week 1): Pt will complete up/down 1 step with BHRs with mod assist  Skilled Therapeutic Interventions/Progress Updates:    Pt presents in room in bed, speaking with nurse, demonstrating hyperverbosity and does require cues to reorient to therapy and task. Pt does not report pain throughout session. Session focused on therapeutic activities to promote improved bed mobility, participation with self care tasks, and transfer training with and without RW, gait training with RW, and NMR for BUE/BLE coordination and muscle fiber recruitment. Pt completes supine to sit out of R side of bed with max cues for sequencing reaching with LUE across body to hospital bed rail and push to upright with pt able to complete with min assist. Pt then participates with upper and lower body dressing with min assist and max verbal cues for sequencing for upper body dressing, completes lower body dressing with mod assist and max verbal cues. Pt completes sit to stand to RW with max asssit and requires max assist for pulling pants over hips with pt demonstrating excessive pushing. Pt completes squat pivot transfer to L with min assist with cues for reaching with LUE to opposite WC arm rest. Pt transported to hallway outside of main gym positoined with handrail on L side. Pt completes gait 30' with LHR and mod assist for postural stability with max verbal cues for leaning into wall on L side to combat pushing on R side, requires cues for RLE foot placement as pt with tendency to scissor and demonstrating excessive  R hip ER. Pt then completes max practice gait training 5x30' with RW with R hemi hand splint, max cues for sequencing pushing RW, shifting weight to L into wall on L side and RLE foot placement. Pt completes with visual cue of mirror however demonstrating difficulty with upright gaze to look in mirror and ultimately visual cue does not assist with midline orientation despite verbal/tactile cues as well. Pt responds best to cue of leaning L shoulder in wall on L. Pt then completes mass practice sit to stand training with mirror as visual cue, max verbal cues for sequencing with pt continuing to demonstrate R lateropulsion however does improve with cues, continues to require mod/max assist. Pt then completes continuous training on nustep x5 minute BUE/BLE without assist, mod cues for RLE/RUE control during exercise, completed as NMR to promote BUE/BLE coordination and R sided muscle fiber recruitment. Pt returns to room and remains seated in [facility] with all needs within reach, cal light in place and chair alarm donned and activated at end of session.    Therapy Documentation Precautions:  Precautions Precautions: Fall Recall of Precautions/Restrictions: Impaired Precaution/Restrictions Comments: R-hemiparesis/hemiplegia Restrictions Weight Bearing Restrictions Per Provider Order: No    Therapy/Group: Individual Therapy  Reche Ohara PT, DPT 04/24/2024, 12:32 PM

## 2024-04-24 NOTE — Progress Notes (Signed)
 Patient ID: Larry Dawson, male   DOB: [birth year], 69 y.o.   MRN: [id number]   [id number] spoke with pt wife to introduce self, explain role, discuss discharge process and inform on ELOS. Pt wife asked about benefits/services offered. SW explained in detail HH, Outpatient, and SNF placement. SW shared there will be follow-up.  Graeme Jude, MSW, LCSW Office: [phone] Cell: [phone] Fax: [phone]

## 2024-04-25 DIAGNOSIS — D1800 Hemangioma unspecified site: Secondary | ICD-10-CM | POA: Diagnosis not present

## 2024-04-25 LAB — GLUCOSE, CAPILLARY
Glucose-Capillary: 108 mg/dL — ABNORMAL HIGH (ref 70–99)
Glucose-Capillary: 107 mg/dL — ABNORMAL HIGH (ref 70–99)
Glucose-Capillary: 107 mg/dL — ABNORMAL HIGH (ref 70–99)
Glucose-Capillary: 84 mg/dL (ref 70–99)

## 2024-04-25 NOTE — Progress Notes (Addendum)
 Speech Language Pathology Daily Session Note  Patient Details  Name: Larry Dawson MRN: [id number] Date of Birth: Oct 04, 1955  Today's Date: 04/25/2024 SLP Individual Time: 1000-1105 SLP Individual Time Calculation (min): 65 min  Short Term Goals: Week 1: SLP Short Term Goal 1 (Week 1): Patient will selectively attend to therapy tasks for 20 minutes given supervision assist. SLP Short Term Goal 2 (Week 1): Patient will solve complex functional problems with supervision assist. SLP Short Term Goal 3 (Week 1): Patient will utilize SLOP speech intelligibility strategies at the conversation level with supervision assist. SLP Short Term Goal 4 (Week 1): Patient will tolerate regular/thin liquid diet with supervision assist for use of safe swallowing strategies.  Skilled Therapeutic Interventions:  Pt seen for ST targeting cognition and speech goals. Pt denied pain. Pt agreeable to ST session and was pleasant and talkative. He recalled 50% of speech intelligibility strategies IND and was able to demonstrate use of slow rate and pausing IND at the conversational level. Speech intelligibility estimated to be 95-100% throughout session. Imprecise articulation x2 noted and pt self-corrected x1. Pt demonstrated excellent recall of recent events leading to hospitalization.  He completed verbal problem solving tasks related to barriers to returning to work when provided min A. He demonstrated good awareness of physical changes since CVA but denied noticing memory or attention changes. SLP provided education on attention changes and pt verbalized understanding. Pt required occasional redirection to structured conversational tasks but sustained attention to preferred tasks for up to 15 minutes. Continue ST POC.   Pain Pain Assessment Pain Scale: 0-10 Pain Score: 0-No pain Faces Pain Scale: No hurt  Therapy/Group: Individual Therapy  Waddell JONETTA Novak, MA CCC-SLP 04/25/2024, 2:37 PM

## 2024-04-25 NOTE — Progress Notes (Signed)
 Occupational Therapy Session Note  Patient Details  Name: Larry Dawson MRN: [id number] Date of Birth: [birth year]  Today's Date: 04/25/2024 OT Individual Time: 8494-8395 OT Individual Time Calculation (min): 59 min    Short Term Goals: Week 1:  OT Short Term Goal 1 (Week 1): Pt will maintain dynamic sitting balance with CGA in preparation for ADL participation. OT Short Term Goal 2 (Week 1): Pt will maintain static standing balance with Max (x1) in preparation for ADL participation. OT Short Term Goal 3 (Week 1): Pt with complete grooming tasks with supervision + BUE integration.  Skilled Therapeutic Interventions/Progress Updates:  Patient agreeable to participate in OT session. Reports 0/10 pain level.   Patient participated in skilled OT session focusing on ue nmr/ rom/ and functional mobility . Patient received in room in wc. Spouse reported concern for patient pain in wc. OT completed wc assessment and gave patient more comfortably fitting wc to decrease pain in back. Patient then taken to gym via wc for time management. Patient completed nuSTEP UBE 12 minutes level 3 with cueing on RUE to maintain grip to increase UE strength, coordination, and dual task to increase ability to grip objects while working on other functional tasks. Patient then transported to main gym to parallel bars. Patient completed 4 bouts of functional mobility 12 ft with max A with +2 for wc follow and cueing for step length, feet spacing, and posterior bias during functional mobility. Patient returned to room with all needs in reach, transfer to recliner max A. Alarm on.      Therapy Documentation Precautions:  Precautions Precautions: Fall Recall of Precautions/Restrictions: Impaired Precaution/Restrictions Comments: R-hemiparesis/hemiplegia Restrictions Weight Bearing Restrictions Per Provider Order: No  Therapy/Group: Individual Therapy  Larry Dawson 04/25/2024, 7:08 AM

## 2024-04-25 NOTE — Progress Notes (Signed)
 Physical Therapy Session Note  Patient Details  Name: Larry Dawson MRN: [id number] Date of Birth: [birth year]  Today's Date: 04/25/2024 PT Individual Time: 0800-0914 PT Individual Time Calculation (min): 74 min   Short Term Goals: Week 1:  PT Short Term Goal 1 (Week 1): Pt will complete transfers with min assist and LRAD PT Short Term Goal 2 (Week 1): Pt will ambulate 63' with LRAD and mod assist PT Short Term Goal 3 (Week 1): Pt will complete up/down 1 step with BHRs with mod assist  Skilled Therapeutic Interventions/Progress Updates:     Pt received supine in bed and agrees to therapy. No complaint of pain. Pt performs supine to sit with cues for body mechanics, sequencing and postioning. Pt performs stand pivot to [facility] with modA/maxA and cues for foot placement, anterior weight shifting, hand placement, and sequencing. WC transport into bathroom. Pt performs stand step onto shower chair with modA and cues for use of grab bars and safe foot positioning. Pt performs upper and lower body bathing with minA and facilitation of thoroughness and cues for attention to Rt side, as well facilitating grip on shower handle, soap, and gra bar with RUE.   Pt performs stand pivot back to [facility] with grab bars and modA, with cues for weight shifting and hip ositioning. PT assists to drying backside and pt performs upper and lower body dressing with assistance. PT provides modA and cues for sit to stand with RW, and pt able to stand while PT and tech assist to pull up pants over hips. Pt performs multiple reps of sit to stand with similar cues and assistance to complete dressing.   WC transport to gym. Pt stands with RW and Rt hand splint, then ambulates x50' with modA and Rt anterior strut AFO. PT provides cues for RLE positioning and engaging Rt hemibody throughout gait cycle. Following rest break, pt ambulates x60' with similar assistance and cueing provided. WC transport back to room. Left seated with call bell  in reach.   Therapy Documentation Precautions:  Precautions Precautions: Fall Recall of Precautions/Restrictions: Impaired Precaution/Restrictions Comments: R-hemiparesis/hemiplegia Restrictions Weight Bearing Restrictions Per Provider Order: No  Therapy/Group: Individual Therapy  [name], PT, DPT 04/25/2024, 3:34 PM

## 2024-04-25 NOTE — Progress Notes (Signed)
 "                                                        PROGRESS NOTE   Subjective/Complaints:  No events overnight.  Patient remains frustrated about slow rate of progress, but overall is doing well.  He complains of feelings of plastic wrap being on my head extending from the posterior surgical site and moving forward to his forehead.  Has been present since admission.  Not painful. Vital stable, blood sugars well-controlled.  ROS: Denies fevers, chills, N/V, abdominal pain, constipation, diarrhea, SOB, cough, chest pain, new weakness or paraesthesias.    Objective:   No results found. No results for input(s): WBC, HGB, HCT, PLT in the last 72 hours.  No results for input(s): NA, K, CL, CO2, GLUCOSE, BUN, CREATININE, CALCIUM  in the last 72 hours.   Intake/Output Summary (Last 24 hours) at 04/25/2024 1651 Last data filed at 04/25/2024 1323 Gross per 24 hour  Intake 480 ml  Output 700 ml  Net -220 ml        Physical Exam: Vital Signs Blood pressure 120/68, pulse (!) 58, temperature 98.4 F (36.9 C), temperature source Oral, resp. rate 16, height 5' 7 (1.702 m), weight 95.7 kg, SpO2 100%.  Physical Exam  Constitutional: No apparent distress. Appropriate appearance for age.  Sitting upright in therapy gym. HENT: Status post left basal craniotomy, site with minimal edema. Eyes: PERRLA.  Heavy nystagmus with left greater than rightward gaze, left glasses taped to improve diplopia. Cardiovascular: RRR, no murmurs/rub/gallops. No Edema. Peripheral pulses 2+  Respiratory: CTAB. No rales, rhonchi, or wheezing. On RA.  Abdomen: + bowel sounds, normoactive. No distention or tenderness.  Skin: Posterior surgical site appears to be healing well, no drainage.  Lower portions of incision are not equally joined at the outer edges, although inner part of the suture line has healed and there is no visible wound bed.  Area is reinforced with Steri-Strips as below  1-16--looks much better 1-17   MSK:      No apparent deformity. + Right ankle AFO       Neurologic exam:  Cognition: AAO to person, place, time and event.  Language: Fluent, No substitutions or neoglisms. No dysarthria. Names 3/3 objects correctly.  Memory:  No apparent deficits  Insight: Good  insight into current condition.  Mood: Pleasant affect, appropriate mood.  Sensation: Reduced to light touch throughout the right hemibody Reflexes: 2+ in BL UE and LEs. Negative Hoffman's and babinski signs bilaterally.  CN: Left sided vision deficits.  Otherwise, intact. Coordination: Upper and lower extremity ataxia. Spasticity: MAS 0 in all extremities.       Strength:                RUE: 3/5 SA, 3/5 EF, 3/5 EE, 4/5 WE, 4/5 FF, 4/5 FA                LUE:  5/5 SA, 5/5 EF, 5/5 EE, 5/5 WE, 5/5 FF, 5/5 FA                RLE: 3/5 HF, 3/5 KE, 0/5  DF, 0/5  EHL, 4/5  PF                 LLE:  5/5 HF, 5/5 KE, 5/5  DF,  5/5  EHL, 5/5  PF  Physical exam unchanged from the above on reexamination 04/25/24    Assessment/Plan: 1. Functional deficits which require 3+ hours per day of interdisciplinary therapy in a comprehensive inpatient rehab setting. Physiatrist is providing close team supervision and 24 hour management of active medical problems listed below. Physiatrist and rehab team continue to assess barriers to discharge/monitor patient progress toward functional and medical goals  Care Tool:  Bathing    Body parts bathed by patient: Right arm, Chest, Abdomen, Front perineal area, Right upper leg, Left upper leg, Face   Body parts bathed by helper: Left arm, Right lower leg, Left lower leg, Buttocks     Bathing assist Assist Level: Moderate Assistance - Patient 50 - 74%     Upper Body Dressing/Undressing Upper body dressing   What is the patient wearing?: Pull over shirt    Upper body assist Assist Level: Minimal Assistance - Patient > 75%    Lower Body Dressing/Undressing Lower body  dressing      What is the patient wearing?: Pants     Lower body assist Assist for lower body dressing: 2 Helpers     Toileting Toileting    Toileting assist Assist for toileting: 2 Helpers     Transfers Chair/bed transfer  Transfers assist     Chair/bed transfer assist level: Moderate Assistance - Patient 50 - 74%     Locomotion Ambulation   Ambulation assist      Assist level: 2 helpers Assistive device: Other (comment) (hand rail) Max distance: 30   Walk 10 feet activity   Assist     Assist level: 2 helpers Assistive device: Other (comment) (hand rail)   Walk 50 feet activity   Assist Walk 50 feet with 2 turns activity did not occur: Safety/medical concerns         Walk 150 feet activity   Assist Walk 150 feet activity did not occur: Safety/medical concerns         Walk 10 feet on uneven surface  activity   Assist Walk 10 feet on uneven surfaces activity did not occur: Safety/medical concerns         Wheelchair     Assist Is the patient using a wheelchair?: Yes Type of Wheelchair: Manual    Wheelchair assist level: Maximal Assistance - Patient 25 - 49% Max wheelchair distance: 43'    Wheelchair 50 feet with 2 turns activity    Assist        Assist Level: Maximal Assistance - Patient 25 - 49%   Wheelchair 150 feet activity     Assist      Assist Level: Total Assistance - Patient < 25%   Blood pressure 120/68, pulse (!) 58, temperature 98.4 F (36.9 C), temperature source Oral, resp. rate 16, height 5' 7 (1.702 m), weight 95.7 kg, SpO2 100%.    Medical Problem List and Plan: 1. 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 Janjua             -patient may  shower with shower cap             -ELOS/Goals: 14-16d -CGA to Min A goals - pending DC date  - Stable to continue inpatient rehab   - 1/13: Going home with 2 daughters, one  is CNA. Min to Mod A UB and Max A LB ADLs; poor body orientation. Other evals pending.   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  He is understanding  2.  Antithrombotics: -DVT/anticoagulation:  Pharmaceutical: Heparin  initiated 04/14/2024             -antiplatelet therapy: N/A 3. Pain Management: Tylenol  as needed   -1-13 discussed positioning off of surgical site at nighttime.  Pain well-controlled on current regimen.  - 1-14: Voltaren  gel 4 times daily to right ankle and calf.  Discussed bracing with therapies.  Add baclofen  5 mg 3 times daily for developing tightness/muscle aching on the right hemibody.  Consider right hip x-ray for arthritis if groin pain becomes worse.  1/15: Pain much improved with current regimen, monitor  1-17: Occipital nerve distribution of odd sensations across his head, not painful, does not want additional treatments at this time  4. Mood/Behavior/Sleep: Provide emotional support             -antipsychotic agents: N/A   - sleeping well, apporpriate  5. Neuropsych/cognition: This patient is capable of making decisions on his own behalf. 6. Skin/Wound Care: Routine skin checks  - 1-14:   will reach out to Dr. [name] regarding DC staples POD 10 tomorrow--OK, order placed 1/15  1-16: Some uneven edges on the surgical line in the lower portion, reinforced with Steri-Strips today.  No drainage or open wound bed, no concern for dehiscence or infection--looking much better 1-17  7. Fluids/Electrolytes/Nutrition: Routine and analysis with follow-up chemistries   - labs stable on admission 8.  Diabetes mellitus.  Hemoglobin A1c 7.2.  Glucophage  500 mg twice daily, Lantus  insulin  8 units nightly Recent Labs     04/25/24 0614 04/25/24 1130 04/25/24 1646  GLUCAP 108* 107* 107*    -Blood sugars tightly controlled   9.  Hyperlipidemia.  Crestor  10.  Hypertension.  Norvasc  5 mg daily, Tenormin  50 mg daily, monitor with increased mobility -Blood pressure, vital stable.    04/25/2024    1:44 PM 04/25/2024    4:53 AM 04/24/2024    7:38 PM  Vitals with BMI  Systolic 120 116 860  Diastolic 68 63 67  Pulse 58 65 65    11.  Class II obesity.  BMI 35.29.  Dietary follow-up 12.  Constipation/IBS.  MiraLAX  daily, Senokot 1 tablet nightly   - 1/12 LBM--may need laxatives increase tomorrow if no improvement  Large bowel movement 1-17  13.  COPD.  History of tobacco use.  Monitor oxygen saturations.  Discussed no nicotine. 14. Urinary urgency. Wean off purwick.    - no incontinence  15. Left vision deficits.  Continue glasses taping, therapies.  - 1-17: Patient endorses double vision has resolved today.  16.  Gastric reflux.  Resume Pepcid  40 mg daily, Tums 3 times daily as needed  LOS: 5 days A FACE TO FACE EVALUATION WAS PERFORMED  Larry Dawson Likes 04/25/2024, 4:51 PM     "

## 2024-04-26 DIAGNOSIS — D1800 Hemangioma unspecified site: Secondary | ICD-10-CM | POA: Diagnosis not present

## 2024-04-26 LAB — GLUCOSE, CAPILLARY
Glucose-Capillary: 104 mg/dL — ABNORMAL HIGH (ref 70–99)
Glucose-Capillary: 119 mg/dL — ABNORMAL HIGH (ref 70–99)
Glucose-Capillary: 107 mg/dL — ABNORMAL HIGH (ref 70–99)
Glucose-Capillary: 125 mg/dL — ABNORMAL HIGH (ref 70–99)

## 2024-04-26 NOTE — Progress Notes (Signed)
 "                                                        PROGRESS NOTE   Subjective/Complaints:  No events overnight.  No acute complaints.  Vital stable  Last bowel movement 1-17  ROS: Denies fevers, chills, N/V, abdominal pain, constipation, diarrhea, SOB, cough, chest pain, new weakness or paraesthesias.    Objective:   No results found. No results for input(s): WBC, HGB, HCT, PLT in the last 72 hours.  No results for input(s): NA, K, CL, CO2, GLUCOSE, BUN, CREATININE, CALCIUM  in the last 72 hours.   Intake/Output Summary (Last 24 hours) at 04/26/2024 2219 Last data filed at 04/26/2024 1859 Gross per 24 hour  Intake 240 ml  Output 250 ml  Net -10 ml        Physical Exam: Vital Signs Blood pressure 119/64, pulse 63, temperature 97.8 F (36.6 C), temperature source Oral, resp. rate 17, height 5' 7 (1.702 m), weight 95.7 kg, SpO2 99%.  Physical Exam  Constitutional: No apparent distress. Appropriate appearance for age.  Sitting up in bedside chair. HENT: Status post left basal craniotomy, site with minimal edema. Eyes: PERRLA.  Heavy nystagmus with left greater than rightward gaze, left glasses taped to improve diplopia. Cardiovascular: RRR, no murmurs/rub/gallops. No Edema. Peripheral pulses 2+  Respiratory: CTAB. No rales, rhonchi, or wheezing. On RA.  Abdomen: + bowel sounds, normoactive. No distention or tenderness.  Skin: Posterior surgical site appears to be healing well, no drainage.  Lower portions of incision are not equally joined at the outer edges, although inner part of the suture line has healed and there is no visible wound bed.  Area is reinforced with Steri-Strips as below 1-16--looks good 1-18   MSK:      No apparent deformity. + Right ankle AFO       Neurologic exam:  Cognition: AAO to person, place, time and event.  Language: Fluent, No substitutions or neoglisms. No dysarthria. Names 3/3 objects correctly.  Memory:  No  apparent deficits  Insight: Good  insight into current condition.  Mood: Pleasant affect, appropriate mood.  Sensation: Reduced to light touch throughout the right hemibody Reflexes: 2+ in BL UE and LEs. Negative Hoffman's and babinski signs bilaterally.  CN: Left sided vision deficits.  Mild facial droop.   Coordination: Upper and lower extremity ataxia. Spasticity: MAS 0 in all extremities.       Strength:                RUE: 3/5 SA, 3/5 EF, 3/5 EE, 4/5 WE, 4/5 FF, 4/5 FA                LUE:  5/5 SA, 5/5 EF, 5/5 EE, 5/5 WE, 5/5 FF, 5/5 FA                RLE: 3/5 HF, 3/5 KE, 0/5  DF, 0/5  EHL, 4/5  PF                 LLE:  5/5 HF, 5/5 KE, 5/5  DF, 5/5  EHL, 5/5  PF  Physical exam unchanged from the above on reexamination 04/26/24    Assessment/Plan: 1. Functional deficits which require 3+ hours per day of interdisciplinary therapy in a comprehensive inpatient rehab setting. Physiatrist  is providing close team supervision and 24 hour management of active medical problems listed below. Physiatrist and rehab team continue to assess barriers to discharge/monitor patient progress toward functional and medical goals  Care Tool:  Bathing    Body parts bathed by patient: Right arm, Chest, Abdomen, Front perineal area, Right upper leg, Left upper leg, Face   Body parts bathed by helper: Left arm, Right lower leg, Left lower leg, Buttocks     Bathing assist Assist Level: Moderate Assistance - Patient 50 - 74%     Upper Body Dressing/Undressing Upper body dressing   What is the patient wearing?: Pull over shirt    Upper body assist Assist Level: Minimal Assistance - Patient > 75%    Lower Body Dressing/Undressing Lower body dressing      What is the patient wearing?: Pants     Lower body assist Assist for lower body dressing: 2 Helpers     Toileting Toileting    Toileting assist Assist for toileting: 2 Helpers     Transfers Chair/bed transfer  Transfers assist      Chair/bed transfer assist level: Moderate Assistance - Patient 50 - 74%     Locomotion Ambulation   Ambulation assist      Assist level: 2 helpers Assistive device: Other (comment) (hand rail) Max distance: 30   Walk 10 feet activity   Assist     Assist level: 2 helpers Assistive device: Other (comment) (hand rail)   Walk 50 feet activity   Assist Walk 50 feet with 2 turns activity did not occur: Safety/medical concerns         Walk 150 feet activity   Assist Walk 150 feet activity did not occur: Safety/medical concerns         Walk 10 feet on uneven surface  activity   Assist Walk 10 feet on uneven surfaces activity did not occur: Safety/medical concerns         Wheelchair     Assist Is the patient using a wheelchair?: Yes Type of Wheelchair: Manual    Wheelchair assist level: Maximal Assistance - Patient 25 - 49% Max wheelchair distance: 64'    Wheelchair 50 feet with 2 turns activity    Assist        Assist Level: Maximal Assistance - Patient 25 - 49%   Wheelchair 150 feet activity     Assist      Assist Level: Total Assistance - Patient < 25%   Blood pressure 119/64, pulse 63, temperature 97.8 F (36.6 C), temperature source Oral, resp. rate 17, height 5' 7 (1.702 m), weight 95.7 kg, SpO2 99%.    Medical Problem List and Plan: 1. 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 Janjua             -patient may  shower with shower cap             -ELOS/Goals: 14-16d -CGA to Min A goals - pending DC date  - Stable to continue inpatient rehab   - 1/13: Going home with 2 daughters, one is CNA. Min to Mod A UB and Max A LB ADLs; poor body orientation. Other evals pending.    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  He is understanding  2.  Antithrombotics: -DVT/anticoagulation:  Pharmaceutical: Heparin  initiated 04/14/2024             -antiplatelet therapy: N/A 3. Pain Management: Tylenol  as needed   -1-13 discussed positioning off of surgical site at nighttime.  Pain well-controlled on current regimen.  - 1-14: Voltaren  gel 4 times daily to right ankle and calf.  Discussed bracing with therapies.  Add baclofen  5 mg 3 times daily for developing tightness/muscle aching on the right hemibody.  Consider right hip x-ray for arthritis if groin pain becomes worse.  1/15: Pain much improved with current regimen, monitor  1-17: Occipital nerve distribution of odd sensations across his head, not painful, does not want additional treatments at this time  4. Mood/Behavior/Sleep: Provide emotional support             -antipsychotic agents: N/A   - sleeping well, apporpriate  5. Neuropsych/cognition: This patient is capable of making decisions on his own behalf. 6. Skin/Wound Care: Routine skin checks  - 1-14:   will reach out to Dr. [name] regarding DC staples POD 10 tomorrow--OK, order placed 1/15  1-16: Some uneven edges on the surgical line in the lower portion, reinforced with Steri-Strips today.  No drainage or open wound bed, no concern for dehiscence or infection--looking much better 1-17  7. Fluids/Electrolytes/Nutrition: Routine and analysis with follow-up chemistries   - labs stable on admission 8.  Diabetes mellitus.  Hemoglobin A1c 7.2.  Glucophage  500 mg twice daily, Lantus  insulin  8 units nightly Recent Labs    04/26/24 1144 04/26/24 1626 04/26/24 2035  GLUCAP 119* 107* 125*    - 1-18: Blood sugars well-controlled, have not resumed home Lantus .  Continue as needed insulin , using rarely, may be able to reduce blood glucose checks this week   9.  Hyperlipidemia.   Crestor  10.  Hypertension.  Norvasc  5 mg daily, Tenormin  50 mg daily, monitor with increased mobility -Blood pressure, vital stable.    04/26/2024    7:52 PM 04/26/2024    3:03 PM 04/26/2024    4:42 AM  Vitals with BMI  Systolic 119 114 896  Diastolic 64 65 60  Pulse 63 63 58    11.  Class II obesity.  BMI 35.29.  Dietary follow-up 12.  Constipation/IBS.  MiraLAX  daily, Senokot 1 tablet nightly   - 1/12 LBM--may need laxatives increase tomorrow if no improvement  Large bowel movement 1-17  13.  COPD.  History of tobacco use.  Monitor oxygen saturations.  Discussed no nicotine. 14. Urinary urgency. Wean off purwick.    - no incontinence  15. Left vision deficits.  Continue glasses taping, therapies.  - 1-17: Patient endorses double vision has resolved today.  16.  Gastric reflux.  Resume Pepcid  40 mg daily, Tums 3 times daily as needed  LOS: 6 days A FACE TO FACE EVALUATION WAS PERFORMED  Larry Dawson Likes 04/26/2024, 10:19 PM     "

## 2024-04-27 ENCOUNTER — Ambulatory Visit (HOSPITAL_COMMUNITY): Attending: Physical Medicine & Rehabilitation

## 2024-04-27 DIAGNOSIS — M79604 Pain in right leg: Secondary | ICD-10-CM

## 2024-04-27 DIAGNOSIS — K5900 Constipation, unspecified: Secondary | ICD-10-CM

## 2024-04-27 DIAGNOSIS — M79661 Pain in right lower leg: Secondary | ICD-10-CM | POA: Insufficient documentation

## 2024-04-27 DIAGNOSIS — I10 Essential (primary) hypertension: Secondary | ICD-10-CM | POA: Diagnosis not present

## 2024-04-27 DIAGNOSIS — D1800 Hemangioma unspecified site: Secondary | ICD-10-CM | POA: Diagnosis not present

## 2024-04-27 DIAGNOSIS — E119 Type 2 diabetes mellitus without complications: Secondary | ICD-10-CM

## 2024-04-27 DIAGNOSIS — Z794 Long term (current) use of insulin: Secondary | ICD-10-CM | POA: Diagnosis not present

## 2024-04-27 LAB — GLUCOSE, CAPILLARY
Glucose-Capillary: 114 mg/dL — ABNORMAL HIGH (ref 70–99)
Glucose-Capillary: 113 mg/dL — ABNORMAL HIGH (ref 70–99)
Glucose-Capillary: 101 mg/dL — ABNORMAL HIGH (ref 70–99)
Glucose-Capillary: 152 mg/dL — ABNORMAL HIGH (ref 70–99)

## 2024-04-27 NOTE — Progress Notes (Signed)
 Right lower extremity venous duplex has been completed.  Results can be found in chart review under CV Proc.  04/27/2024 5:55 PM  Ayen Viviano Elden Appl, RVT.

## 2024-04-27 NOTE — Progress Notes (Addendum)
 "                                                        PROGRESS NOTE   Subjective/Complaints:  Reports some continued right-sided pain and tightness.  Reports its greatest in his right calf area.  He reports he also has symptoms in the thigh.  Reports Tylenol  is helping.  No additional concerns or complaints.  Vital stable  Last bowel movement 1-18  ROS: Patient denies fever, new vision changes, dizziness, nausea, vomiting, diarrhea,  shortness of breath or chest pain, headache, or mood change.  Objective:   No results found. No results for input(s): WBC, HGB, HCT, PLT in the last 72 hours.  No results for input(s): NA, K, CL, CO2, GLUCOSE, BUN, CREATININE, CALCIUM  in the last 72 hours.   Intake/Output Summary (Last 24 hours) at 04/27/2024 1301 Last data filed at 04/27/2024 0800 Gross per 24 hour  Intake 510 ml  Output 325 ml  Net 185 ml        Physical Exam: Vital Signs Blood pressure 122/70, pulse (!) 54, temperature 97.6 F (36.4 C), temperature source Oral, resp. rate 16, height 5' 7 (1.702 m), weight 95.7 kg, SpO2 98%.  Physical Exam  Constitutional: No apparent distress. Appropriate appearance for age.  Appears comfortable lying in bed.   HENT: Status post left basal craniotomy, site with minimal edema. Eyes: PERRLA.  Heavy nystagmus with left greater than rightward gaze, left glasses taped to improve diplopia. Cardiovascular: RRR, no murmurs/rub/gallops. No Edema. Peripheral pulses 2+  Respiratory: CTAB. No rales, rhonchi, or wheezing. On RA.  Abdomen: + bowel sounds, normoactive. No distention or tenderness.  Skin: Posterior surgical site appears to be healing well, no drainage.  Lower portions of incision are not equally joined at the outer edges, although inner part of the suture line has healed and there is no visible wound bed.  Area is reinforced with Steri-Strips as below 1-16--looks good 1-19   MSK:      No apparent deformity. +  Right ankle AFO      Minimal right leg tenderness  Neurologic exam:  Cognition: AAO to person, place, time and event.  Language: Fluent, No substitutions or neoglisms. No dysarthria. Names 3/3 objects correctly.  Memory:  No apparent deficits  Insight: Good  insight into current condition.  Mood: Pleasant affect, appropriate mood.  Sensation: Reduced to light touch throughout the right hemibody Reflexes: 2+ in BL UE and LEs. Negative Hoffman's and babinski signs bilaterally.  CN: Left sided vision deficits.  Mild facial droop.   Coordination: Upper and lower extremity ataxia. Spasticity: MAS 0 in all extremities.       Strength:                RUE: 3/5 SA, 3/5 EF, 3/5 EE, 4/5 WE, 4/5 FF, 4/5 FA                LUE:  5/5 SA, 5/5 EF, 5/5 EE, 5/5 WE, 5/5 FF, 5/5 FA                RLE: 3/5 HF, 3/5 KE, 0/5  DF, 0/5  EHL, 4/5  PF                 LLE:  5/5 HF, 5/5 KE, 5/5  DF, 5/5  EHL, 5/5  PF  Physical exam unchanged from the above on reexamination 04/27/24    Assessment/Plan: 1. Functional deficits which require 3+ hours per day of interdisciplinary therapy in a comprehensive inpatient rehab setting. Physiatrist is providing close team supervision and 24 hour management of active medical problems listed below. Physiatrist and rehab team continue to assess barriers to discharge/monitor patient progress toward functional and medical goals  Care Tool:  Bathing    Body parts bathed by patient: Right arm, Chest, Abdomen, Front perineal area, Right upper leg, Left upper leg, Face   Body parts bathed by helper: Left arm, Right lower leg, Left lower leg, Buttocks     Bathing assist Assist Level: Moderate Assistance - Patient 50 - 74%     Upper Body Dressing/Undressing Upper body dressing   What is the patient wearing?: Pull over shirt    Upper body assist Assist Level: Minimal Assistance - Patient > 75%    Lower Body Dressing/Undressing Lower body dressing      What is the patient  wearing?: Pants     Lower body assist Assist for lower body dressing: 2 Helpers     Toileting Toileting    Toileting assist Assist for toileting: 2 Helpers     Transfers Chair/bed transfer  Transfers assist     Chair/bed transfer assist level: Moderate Assistance - Patient 50 - 74%     Locomotion Ambulation   Ambulation assist      Assist level: 2 helpers Assistive device: Other (comment) (hand rail) Max distance: 30   Walk 10 feet activity   Assist     Assist level: 2 helpers Assistive device: Other (comment) (hand rail)   Walk 50 feet activity   Assist Walk 50 feet with 2 turns activity did not occur: Safety/medical concerns         Walk 150 feet activity   Assist Walk 150 feet activity did not occur: Safety/medical concerns         Walk 10 feet on uneven surface  activity   Assist Walk 10 feet on uneven surfaces activity did not occur: Safety/medical concerns         Wheelchair     Assist Is the patient using a wheelchair?: Yes Type of Wheelchair: Manual    Wheelchair assist level: Maximal Assistance - Patient 25 - 49% Max wheelchair distance: 14'    Wheelchair 50 feet with 2 turns activity    Assist        Assist Level: Maximal Assistance - Patient 25 - 49%   Wheelchair 150 feet activity     Assist      Assist Level: Total Assistance - Patient < 25%   Blood pressure 122/70, pulse (!) 54, temperature 97.6 F (36.4 C), temperature source Oral, resp. rate 16, height 5' 7 (1.702 m), weight 95.7 kg, SpO2 98%.    Medical Problem List and Plan: 1. 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 Janjua             -patient may  shower with shower cap             -ELOS/Goals: 14-16d -CGA to Min A goals - pending DC date  - Stable to continue inpatient rehab   - 1/13: Going home with 2 daughters, one is CNA. Min to Mod A UB and Max A LB  ADLs; poor body orientation. Other evals pending.    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  He is understanding  - Team conference tomorrow  2.  Antithrombotics: -DVT/anticoagulation:  Pharmaceutical: Heparin  initiated 04/14/2024             -antiplatelet therapy: N/A 3. Pain Management: Tylenol  as needed   -1-13 discussed positioning off of surgical site at nighttime.  Pain well-controlled on current regimen.  - 1-14: Voltaren  gel 4 times daily to right ankle and calf.  Discussed bracing with therapies.  Add baclofen  5 mg 3 times daily for developing tightness/muscle aching on the right hemibody.  Consider right hip x-ray for arthritis if groin pain becomes worse.  1/15: Pain much improved with current regimen, monitor  1-17: Occipital nerve distribution of odd sensations across his head, not painful, does not want additional treatments at this time  -1/19 Will order Vas US  r/o DVT but think it is less likely  4. Mood/Behavior/Sleep: Provide emotional support             -antipsychotic agents: N/A   - sleeping well, apporpriate  5. Neuropsych/cognition: This patient is capable of making decisions on his own behalf.  6. Skin/Wound Care: Routine skin checks  - 1-14:   will reach out to Dr. [name] regarding DC staples POD 10 tomorrow--OK, order placed 1/15  1-16: Some uneven edges on the surgical line in the lower portion, reinforced with Steri-Strips today.  No drainage or open wound bed, no concern for dehiscence or infection--looking much better 1-17  7. Fluids/Electrolytes/Nutrition: Routine and analysis with follow-up chemistries   - labs stable on admission 8.  Diabetes mellitus.  Hemoglobin A1c 7.2.  Glucophage  500 mg twice  daily, Lantus  insulin  8 units nightly Recent Labs    04/26/24 2035 04/27/24 0551 04/27/24 1128  GLUCAP 125* 114* 113*    - 1-18: Blood sugars well-controlled, have not resumed home Lantus .  Continue as needed insulin , using rarely, may be able to reduce blood glucose checks this week -1/19 well-controlled continue current regimen and monitor   9.  Hyperlipidemia.  Crestor  10.  Hypertension.  Norvasc  5 mg daily, Tenormin  50 mg daily, monitor with increased mobility -Blood pressure, vital stable.    04/27/2024    4:12 AM 04/26/2024    7:52 PM 04/26/2024    3:03 PM  Vitals with BMI  Systolic 122 119 885  Diastolic 70 64 65  Pulse 54 63 63    11.  Class II obesity.  BMI 35.29.  Dietary follow-up 12.  Constipation/IBS.  MiraLAX  daily, Senokot 1 tablet nightly   - 1/12 LBM--may need laxatives increase tomorrow if no improvement  Last bowel movement 1-18  13.  COPD.  History of tobacco use.  Monitor oxygen saturations.  Discussed no nicotine. 14. Urinary urgency. Wean off purwick.    - no incontinence  15. Left vision deficits.  Continue glasses taping, therapies.  - 1-17: Patient endorses double vision has resolved today.  16.  Gastric reflux.  Resume Pepcid  40 mg daily, Tums 3 times daily as needed  LOS: 7 days A FACE TO FACE EVALUATION WAS PERFORMED  Murray Collier 04/27/2024, 1:01 PM     "

## 2024-04-27 NOTE — Progress Notes (Signed)
 Occupational Therapy Weekly Progress Note  Patient Details  Name: Larry Dawson MRN: [id number] Date of Birth: 1956/04/05  Beginning of progress report period: April 21, 2024 End of progress report period: April 27, 2024  Today's Date: 04/27/2024 OT Individual Time: 0920-1030 OT Individual Time Calculation (min): 70 min   Today's Date: 04/27/2024 OT Individual Time: 1420-1445 OT Individual Time Calculation (min): 25 min   Patient has met 3 of 3 short term goals this reporting period. See below for updated on CLOF. Caregiver education to be completed closer to discharge. Discharge date to be set 1/20.  Patient continues to demonstrate the following deficits: muscle weakness, decreased cardiorespiratoy endurance, unbalanced muscle activation, decreased coordination, and decreased motor planning, decreased visual acuity and decreased visual perceptual skills, decreased midline orientation and decreased attention to right, decreased safety awareness, and decreased sitting balance, decreased standing balance, decreased postural control, hemiplegia, and decreased balance strategies and therefore will continue to benefit from skilled OT intervention to enhance overall performance with BADL and Reduce care partner burden.  Patient progressing toward long term goals..  Continue plan of care.  OT Short Term Goals Week 1:  OT Short Term Goal 1 (Week 1): Pt will maintain dynamic sitting balance with CGA in preparation for ADL participation. OT Short Term Goal 1 - Progress (Week 1): Met OT Short Term Goal 2 (Week 1): Pt will maintain static standing balance with Max (x1) in preparation for ADL participation. OT Short Term Goal 2 - Progress (Week 1): Met OT Short Term Goal 3 (Week 1): Pt with complete grooming tasks with supervision + BUE integration. OT Short Term Goal 3 - Progress (Week 1): Met Week 2:  OT Short Term Goal 1 (Week 2): Pt will thread LB garments with Min A. OT Short Term Goal 2  (Week 2): Pt will perform UB care with Min A + Min cues for modification techniques integration. OT Short Term Goal 3 (Week 2): Pt will perform toilet transfer with Mod A (x1).  Skilled Therapeutic Interventions/Progress Updates:   Session 1:  Pt greeted resting in bed, reports of improved R-ankle pain, rest/repositioning provided as needed. Supine>EOB with Mod A + cues for technique. Sitting balance with intermediate Min A during dressing as patient demos posterior bias. UB dressing with Min A for management of RUE, continued Max A provided for LB dressing due to decreased functional reach and RLE flaccidity. Sit<>stand from EOB for hiking at Max A (+1), + 2 assisting with garment management. Stand-pivot from WC>toilet with Max A (+1), Min A of +2 to assist with guiding hips. Pt does pushes towards rightward with LLE when attempting to sit on toilet, requiring assistance to correct. Pt requires Max A for 3/3 toileting tasks, continent BM. Standing balance improves with time in stance, as little as Min A provided with BUE supported on grab bars. Pt is able to hike LB garments on L-side of body. Sink-side grooming in sitting with supervision. Pt dependent for WC transport from room<>day room. In day room, pt instructed in functional reaching task targeting shoulder abduction/flexion below waist-level to manage compensatory movements. Pt able to maintain gross grip onto joyfeel balls to transport. Activity upgraded to reaching at shoulder level, mild truncal compensation noted. Pt remained sitting in WC with all immediate needs met.   Session 2:  Pt greeted sitting in WC, no reports of pain. Pt dependently transported from room<>day room. At table-top, patient instructed in functional reach activity targeting flexion/abduction at nipple-line height. Pt tasks with  grasping onto cups (requires downgrade to textured cones), OT providing distal support to manage weight of limb, requires stabilization of items  to achieve grasp. Pt then completes 2 x 1 min cycles of BUE ergometer for gentle ROM/strengthening. OT continues to provide distal support. Pt remained sitting in WC with all immediate needs met.   Therapy Documentation Precautions:  Precautions Precautions: Fall Recall of Precautions/Restrictions: Impaired Precaution/Restrictions Comments: R-hemiparesis/hemiplegia Restrictions Weight Bearing Restrictions Per Provider Order: No   Therapy/Group: Individual Therapy  Nereida Habermann, OTR/L, MSOT  04/27/2024, 6:20 AM

## 2024-04-27 NOTE — Progress Notes (Signed)
 Physical Therapy Session Note  Patient Details  Name: Larry Dawson MRN: [id number] Date of Birth: Nov 04, 1955  Today's Date: 04/27/2024 PT Individual Time: 1302-1400 PT Individual Time Calculation (min): 58 min   Short Term Goals: Week 1:  PT Short Term Goal 1 (Week 1): Pt will complete transfers with min assist and LRAD PT Short Term Goal 2 (Week 1): Pt will ambulate 45' with LRAD and mod assist PT Short Term Goal 3 (Week 1): Pt will complete up/down 1 step with BHRs with mod assist   Skilled Therapeutic Interventions/Progress Updates:  Patient seated upright in w/c on entrance to room. Patient alert and agreeable to PT session. Friend in room and pt completing conversation while prepped to leave room in w/c for therapy session.   Patient with no pain complaint at start of session.  Transported to day room dependently via w/c for time.   Therapeutic Activity: Transfers: Pt performed sit<>stand transfers to RW throughout session with overall MinA and stand pivot transfers throughout session with Min/ ModA. Provided vc/ tc for RLE advancement.  Gait Training:  Pt ambulated 20' x1/ 35' x1 using BUE HHA with MinA +2. Demonstrated flexed posture, difficulty with RLE advancement and catch/ drag of toe, difficulty with maintaining balance d/t posterior bias in stance. When ambulating with use of RW, reaches 130' x1 with overall MinA. Pt also demos more ER/ adduction of hip during swing phase. Provided with consistent vc throughout in order to remind pt to correct each of these noted impairments. He is aware and able to correct at least one per vc.   Neuromuscular Re-ed: NMR facilitated during session with focus on standing balance, motor control. Pt guided in stance to BUE HHA and placement of retrieved playing cards and placement to match on back of standing mirror. Pt does require MinA to lift RUE to upper reaches of matches on card. Also requires vc for increased pinch grasp to each card.  Sometimes requires LUE to adjust card in RUE for improved approach and attachment of cards. Difficulty in placing final 2 cards and pt assisted in backwards stepping to reach table and then lower to sit with control and CGA/ MinA. Otherwise pt requiring up to ModA to maintain balance and place cards on board.   NMR performed for improvements in motor control and coordination, balance, sequencing, judgement, and self confidence/ efficacy in performing all aspects of mobility at highest level of independence.   Patient seated upright in w/c at end of session with brakes locked, belt alarm set, and all needs within reach. Oriented to time and time of next therapy session in 15 min.    Therapy Documentation Precautions:  Precautions Precautions: Fall Recall of Precautions/Restrictions: Impaired Precaution/Restrictions Comments: R-hemiparesis/hemiplegia Restrictions Weight Bearing Restrictions Per Provider Order: No  Pain: No pain complaint this session.   Therapy/Group: Individual Therapy  Mliss DELENA Milliner PT, DPT, CSRS 04/27/2024, 6:39 PM

## 2024-04-27 NOTE — Progress Notes (Signed)
 Speech Language Pathology Daily Session Note  Patient Details  Name: Larry Dawson MRN: [id number] Date of Birth: [birth year]  Today's Date: 04/27/2024 SLP Individual Time: 1400-1430 SLP Individual Time Calculation (min): 30 min  Short Term Goals: Week 1: SLP Short Term Goal 1 (Week 1): Patient will selectively attend to therapy tasks for 20 minutes given supervision assist. SLP Short Term Goal 2 (Week 1): Patient will solve complex functional problems with supervision assist. SLP Short Term Goal 3 (Week 1): Patient will utilize SLOP speech intelligibility strategies at the conversation level with supervision assist. SLP Short Term Goal 4 (Week 1): Patient will tolerate regular/thin liquid diet with supervision assist for use of safe swallowing strategies.  Skilled Therapeutic Interventions:   Pt greeted at bedside for tx targeting cognition and motor speech production. He was very verbose, requiring multiple redirections throughout conversational tasks. He utilized compensatory speech strategies w/ only supervisionA to maintain 95% intelligibility or greater throughout. He demonstrated adequate intelectual awareness re physical deficits, but benefited from minA to begin conversation re anticipatory awareness. Limited conversation given time constraints and verbosity of patient; will continue in upcoming tx sessions. At the end of tx tasks, he was left in his [facility] w/ the alarm set and the call light within reach. Recommend cont ST per POC.   Pain  None reported  Therapy/Group: Individual Therapy  [name] 04/27/2024, 11:02 PM

## 2024-04-28 DIAGNOSIS — D1800 Hemangioma unspecified site: Secondary | ICD-10-CM | POA: Diagnosis not present

## 2024-04-28 LAB — GLUCOSE, CAPILLARY
Glucose-Capillary: 126 mg/dL — ABNORMAL HIGH (ref 70–99)
Glucose-Capillary: 112 mg/dL — ABNORMAL HIGH (ref 70–99)
Glucose-Capillary: 113 mg/dL — ABNORMAL HIGH (ref 70–99)
Glucose-Capillary: 139 mg/dL — ABNORMAL HIGH (ref 70–99)

## 2024-04-28 MED ORDER — BACLOFEN 10 MG PO TABS
10.0000 mg | ORAL_TABLET | Freq: Three times a day (TID) | ORAL | Status: DC
  Administered 2024-04-28 – 2024-04-30 (×5): 10 mg via ORAL
  Filled 2024-04-28 (×5): qty 1

## 2024-04-28 NOTE — Progress Notes (Signed)
 "                                                        PROGRESS NOTE   Subjective/Complaints:  No acute complaints.  No events overnight.  Is having some spasms in his right calf, last night and the night before.  Also, feels more tired today.  Does note that he uses a CPAP machine at home, has a portable machine that he can bring in but has not yet.  ROS: Patient denies fever, new vision changes, dizziness, nausea, vomiting, diarrhea,  shortness of breath or chest pain, headache, or mood change.  Objective:   VAS US  LOWER EXTREMITY VENOUS (DVT) Result Date: 04/28/2024  Lower Venous DVT Study Patient Name:  Larry Dawson  Date of Exam:   04/27/2024 Medical Rec #: [id number]        Accession #:    [id number] Date of Birth: 11-Mar-1956        Patient Gender: M Patient Age:   69 years Exam Location:  [facility] Procedure:      VAS US  LOWER EXTREMITY VENOUS (DVT) Referring Phys: MURRAY COLLIER --------------------------------------------------------------------------------  Indications: Pain, and weakness, tightness.  Comparison Study: No prior exam. Performing Technologist: Edilia Elden Appl  Examination Guidelines: A complete evaluation includes B-mode imaging, spectral Doppler, color Doppler, and power Doppler as needed of all accessible portions of each vessel. Bilateral testing is considered an integral part of a complete examination. Limited examinations for reoccurring indications may be performed as noted. The reflux portion of the exam is performed with the patient in reverse Trendelenburg.  +---------+---------------+---------+-----------+----------+--------------+ RIGHT    CompressibilityPhasicitySpontaneityPropertiesThrombus Aging +---------+---------------+---------+-----------+----------+--------------+ CFV      Full           Yes      Yes                                 +---------+---------------+---------+-----------+----------+--------------+ SFJ      Full            Yes      Yes                                 +---------+---------------+---------+-----------+----------+--------------+ FV Prox  Full                                                        +---------+---------------+---------+-----------+----------+--------------+ FV Mid   Full                                                        +---------+---------------+---------+-----------+----------+--------------+ FV DistalFull                                                        +---------+---------------+---------+-----------+----------+--------------+  PFV      Full                                                        +---------+---------------+---------+-----------+----------+--------------+ POP      Full           No       No                                  +---------+---------------+---------+-----------+----------+--------------+ PTV      Full                                                        +---------+---------------+---------+-----------+----------+--------------+ PERO     Full                                                        +---------+---------------+---------+-----------+----------+--------------+   +----+---------------+---------+-----------+----------+--------------+ LEFTCompressibilityPhasicitySpontaneityPropertiesThrombus Aging +----+---------------+---------+-----------+----------+--------------+ CFV Full           Yes      Yes                                 +----+---------------+---------+-----------+----------+--------------+ SFJ Full           Yes      Yes                                 +----+---------------+---------+-----------+----------+--------------+     Summary: RIGHT: - There is no evidence of deep vein thrombosis in the lower extremity.  - No cystic structure found in the popliteal fossa.  LEFT: - No evidence of common femoral vein obstruction.   *See table(s) above for measurements and observations.  Electronically signed by Penne Colorado MD on 04/28/2024 at 10:11:47 AM.    Final    No results for input(s): WBC, HGB, HCT, PLT in the last 72 hours.  No results for input(s): NA, K, CL, CO2, GLUCOSE, BUN, CREATININE, CALCIUM  in the last 72 hours.   Intake/Output Summary (Last 24 hours) at 04/28/2024 1040 Last data filed at 04/28/2024 0727 Gross per 24 hour  Intake 480 ml  Output 250 ml  Net 230 ml        Physical Exam: Vital Signs Blood pressure 116/70, pulse (!) 56, temperature 97.9 F (36.6 C), temperature source Oral, resp. rate 16, height 5' 7 (1.702 m), weight 95.7 kg, SpO2 100%.  Physical Exam  Constitutional: No apparent distress. Appropriate appearance for age.  Sitting up in therapy gym. HENT: Status post left basal craniotomy, site with minimal edema. Eyes: PERRLA.  Heavy nystagmus with left greater than rightward gaze, left glasses taped to improve diplopia. Cardiovascular: RRR, no murmurs/rub/gallops. No Edema. Peripheral pulses 2+  Respiratory: CTAB. No rales, rhonchi, or wheezing. On RA.  Abdomen: + bowel sounds, normoactive. No distention or tenderness.  Skin: Posterior surgical site appears to be healing well--Area is reinforced with Steri-Strips --stable   MSK:      No apparent deformity. + Right ankle AFO      Minimal right leg tenderness  Neurologic exam:  Cognition: AAO to person, place, time and event.  Language: Fluent, No substitutions or neoglisms. No dysarthria. Names 3/3 objects correctly.  Memory:  No apparent deficits  Insight: Good  insight into current condition.  Mood: Pleasant affect, appropriate mood.  Sensation: Reduced to light touch throughout the right hemibody Reflexes: 2+ in BL UE and LEs. Negative Hoffman's and babinski signs bilaterally.  CN: Left sided vision deficits.  Mild facial droop.   Coordination: Upper and lower extremity ataxia. Spasticity: MAS 0 in all extremities.       Strength:                 RUE: 3/5 SA, 3/5 EF, 3/5 EE, 4/5 WE, 4/5 FF, 4/5 FA                LUE:  5/5 SA, 5/5 EF, 5/5 EE, 5/5 WE, 5/5 FF, 5/5 FA                RLE: 3/5 HF, 3/5 KE, 0/5  DF, 0/5  EHL, 4/5  PF                 LLE:  5/5 HF, 5/5 KE, 5/5  DF, 5/5  EHL, 5/5  PF  Physical exam unchanged from the above on reexamination 04/28/24    Assessment/Plan: 1. Functional deficits which require 3+ hours per day of interdisciplinary therapy in a comprehensive inpatient rehab setting. Physiatrist is providing close team supervision and 24 hour management of active medical problems listed below. Physiatrist and rehab team continue to assess barriers to discharge/monitor patient progress toward functional and medical goals  Care Tool:  Bathing    Body parts bathed by patient: Right arm, Chest, Abdomen, Front perineal area, Right upper leg, Left upper leg, Face   Body parts bathed by helper: Left arm, Right lower leg, Left lower leg, Buttocks     Bathing assist Assist Level: Moderate Assistance - Patient 50 - 74%     Upper Body Dressing/Undressing Upper body dressing   What is the patient wearing?: Pull over shirt    Upper body assist Assist Level: Minimal Assistance - Patient > 75%    Lower Body Dressing/Undressing Lower body dressing      What is the patient wearing?: Pants     Lower body assist Assist for lower body dressing: 2 Helpers     Toileting Toileting    Toileting assist Assist for toileting: 2 Helpers     Transfers Chair/bed transfer  Transfers assist     Chair/bed transfer assist level: Moderate Assistance - Patient 50 - 74%     Locomotion Ambulation   Ambulation assist      Assist level: 2 helpers Assistive device: Other (comment) (hand rail) Max distance: 30   Walk 10 feet activity   Assist     Assist level: 2 helpers Assistive device: Other (comment) (hand rail)   Walk 50 feet activity   Assist Walk 50 feet with 2 turns activity did not occur:  Safety/medical concerns         Walk 150 feet activity   Assist Walk 150 feet activity did not occur: Safety/medical concerns         Walk 10 feet on uneven surface  activity  Assist Walk 10 feet on uneven surfaces activity did not occur: Safety/medical concerns         Wheelchair     Assist Is the patient using a wheelchair?: Yes Type of Wheelchair: Manual    Wheelchair assist level: Maximal Assistance - Patient 25 - 49% Max wheelchair distance: 30'    Wheelchair 50 feet with 2 turns activity    Assist        Assist Level: Maximal Assistance - Patient 25 - 49%   Wheelchair 150 feet activity     Assist      Assist Level: Total Assistance - Patient < 25%   Blood pressure 116/70, pulse (!) 56, temperature 97.9 F (36.6 C), temperature source Oral, resp. rate 16, height 5' 7 (1.702 m), weight 95.7 kg, SpO2 100%.    Medical Problem List and Plan: 1. 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 Janjua             -patient may  shower with shower cap             -ELOS/Goals: 14-16d -CGA to Min A goals -DC date 05/12/24  - Stable to continue inpatient rehab   - 1/13: Going home with 2 daughters, one is CNA. Min to Mod A UB and Max A LB ADLs; poor body orientation. Other evals pending.    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  He is understanding   - 1/20: Min A transfers and walker 130 ft yesterday; Mod- Max A today handheld assist due to fatigue. Min-Mod A UB care, Mod A LB care due to poor midline orientation and balance. Mild dysarthria and aprosity. Some attention/reasoning deficits c/b personality.  - Likely  custom AFO needed closer to discharge    2.  Antithrombotics: -DVT/anticoagulation:  Pharmaceutical: Heparin  initiated 04/14/2024             -antiplatelet therapy: N/A 3. Pain Management: Tylenol  as needed   -1-13 discussed positioning off of surgical site at nighttime.  Pain well-controlled on current regimen.  - 1-14: Voltaren  gel 4 times daily to right ankle and calf.  Discussed bracing with therapies.  Add baclofen  5 mg 3 times daily for developing tightness/muscle aching on the right hemibody.  Consider right hip x-ray for arthritis if groin pain becomes worse.  1/15: Pain much improved with current regimen, monitor  1-17: Occipital nerve distribution of odd sensations across his head, not painful, does not want additional treatments at this time  -1/19 Will order Vas US  r/o DVT but think it is less likely--ultrasound negative  1-20: Increase baclofen  to 10 mg 3 times daily  4. Mood/Behavior/Sleep: Provide emotional support             -antipsychotic agents: N/A   - sleeping well, apporpriate  5. Neuropsych/cognition: This patient is capable of making decisions on his own behalf.  6. Skin/Wound Care: Routine skin checks  - 1-14:   will reach out to Dr. [name] regarding DC staples POD 10 tomorrow--OK, order placed 1/15  1-16: Some uneven edges on the surgical line in the lower portion, reinforced with Steri-Strips today.  No drainage or open wound bed, no concern for dehiscence  or infection--looking much better   7. Fluids/Electrolytes/Nutrition: Routine and analysis with follow-up chemistries   - labs stable on admission 8.  Diabetes mellitus.  Hemoglobin A1c 7.2.  Glucophage  500 mg twice daily, Lantus  insulin  8 units nightly Recent Labs    04/27/24 1643 04/27/24 2104 04/28/24 0547  GLUCAP 101* 152* 126*    - 1-18: Blood sugars well-controlled, have not resumed home Lantus .  Continue as needed insulin , using rarely, may be able to reduce blood glucose checks this week -1/19  well-controlled continue current regimen and monitor   9.  Hyperlipidemia.  Crestor  10.  Hypertension.  Norvasc  5 mg daily, Tenormin  50 mg daily, monitor with increased mobility -Blood pressure, vital stable.    04/28/2024    4:21 AM 04/27/2024    7:36 PM 04/27/2024    2:18 PM  Vitals with BMI  Systolic 116 112 889  Diastolic 70 63 77  Pulse 56 65 68    11.  Class II obesity.  BMI 35.29.  Dietary follow-up 12.  Constipation/IBS.  MiraLAX  daily, Senokot 1 tablet nightly   - 1/12 LBM--may need laxatives increase tomorrow if no improvement  Last bowel movement 1-18  13.  COPD.  History of tobacco use.  Monitor oxygen saturations.  Discussed no nicotine.  1-20: Patient to bring in portable CPAP machine from home; nursing informed to assess and set up  14. Urinary urgency. Wean off purwick.    - no incontinence  15. Left vision deficits.  Continue glasses taping, therapies.  - 1-17: Patient endorses double vision has resolved today.  16.  Gastric reflux.  Resume Pepcid  40 mg daily, Tums 3 times daily as needed  LOS: 8 days A FACE TO FACE EVALUATION WAS PERFORMED  Larry Dawson Likes 04/28/2024, 10:40 AM     "

## 2024-04-28 NOTE — Progress Notes (Signed)
 Physical Therapy Weekly Progress Note  Patient Details  Name: Larry Dawson MRN: [id number] Date of Birth: 08/17/1955  Beginning of progress report period: April 21, 2024 End of progress report period: April 28, 2024  Today's Date: 04/28/2024 PT Individual Time: 9191-9081 + 8894-8796 PT Individual Time Calculation (min): 70 min + 58 min  Patient has met 0 of 3 short term goals.  Pt making progress towards functional goals. Pt currently requires supervision for bed mobility using hospital bed features, requires min/mod assist for transfers using RW, ambulates with mod/max assist x2 180' with hand held assist bilaterally. Pt has been limited due to cognitive deficits limiting attention to task as well as R inattention and poor midline orientation with pt demonstrating R lateropulsion and retropulsion with mobility tasks. Pt will likely require AFO consult as well as family education prior to DC.  Patient continues to demonstrate the following deficits muscle weakness, decreased cardiorespiratoy endurance, impaired timing and sequencing, unbalanced muscle activation, decreased coordination, and decreased motor planning, decreased visual acuity and decreased visual perceptual skills, decreased midline orientation and decreased attention to right, decreased attention, decreased safety awareness, and delayed processing, and decreased sitting balance, decreased standing balance, decreased postural control, hemiplegia, and decreased balance strategies and therefore will continue to benefit from skilled PT intervention to increase functional independence with mobility.  Patient progressing toward long term goals..  Continue plan of care.  PT Short Term Goals Week 1:  PT Short Term Goal 1 (Week 1): Pt will complete transfers with min assist and LRAD PT Short Term Goal 1 - Progress (Week 1): Progressing toward goal PT Short Term Goal 2 (Week 1): Pt will ambulate 24' with LRAD and mod assist PT Short  Term Goal 2 - Progress (Week 1): Progressing toward goal PT Short Term Goal 3 (Week 1): Pt will complete up/down 1 step with BHRs with mod assist PT Short Term Goal 3 - Progress (Week 1): Progressing toward goal Week 2:  PT Short Term Goal 1 (Week 2): Pt will complete transfers consistently with minA and LRAD PT Short Term Goal 2 (Week 2): Pt will complete gait 150' with min assist and LRAD PT Short Term Goal 3 (Week 2): Pt will complete up/down 4 steps with mod assist  Skilled Therapeutic Interventions/Progress Updates:    SESSION 1: Pt presents in room in bed, agreeable to PT. Pt does not report pain during session. Session focused on therapeutic activities to facilitate improved participation with self care tasks and transfer, gait training with RW and with hand held assist, and NMR for R hemi strengthening and dynamic standing balance. Pt completes bed mobility with supervision, increased time using hospital bed features. Pt able to manage putting on shirt with supervision, increased time and skilled cues for sequencing. Pt requires min assist for threading pants in sitting. Therapist dons shoes and R GRAFO with total assist for time management. Pt completes sit to stand transfer with RW with R hemi hand splint with min assist from EOB, CGA for standing balance. Pt completes stand step transfer with RW with min/mod assist to [facility]. Pt transported to day room dependently for time management. Pt completes sit to stands to RW with min/mod assist with fatigue to RW. Pt then completes gait training with RW with R hemi hand splint, heavy mod assist 2x20' with +2 WC follow, increased R lateropulsion noted and difficulty advancing RW. Pt then completes gait training ambulating 180' with mod/max x2 B HHA with cues for RLE foot placement and  increasing L step length as pt demonstrating step to gait pattern leading with RLE. Pt completes NMR with BUE support to promote BLE strengthening, midline orientation, dynamic  standing balance including: - step taps 4 step BLE x15 (therapist maintaining R knee stability with R knee in stance, assist with RLE coordination with RLE step taps) - kinetron standing 30 sec work/30 sec rest x6 rounds Pt returns to room and remains seated in [facility] with all needs within reach, cal light in place and chair alarm donned and activated at end of session.   SESSION 2: Pt presents in room in [facility], agreeable to PT. Pt denies pain states he feels. Session focused on NMR for dynamic standing balance, BUE coordination, midline orientation and therapeutic activities for transfer training. Pt transported to day room via [employer], completes stand step transfer with RW with mod assist. Pt completes sit to stand without device with min assist for immediate standing balance. Pt completes NMR for dynamic standing balance, BUE coordination, midline orientation including: - side step RLE x15 (mod assist x2 for balance) - standing reaching overhead with RUE hooking/unhooking horseshoes from top of mirror x9 horseshoes, 3 trials - seated ball toss two hand toss/catch x20 and standing 2x20 (RUE delayed) - pt requires min assist for postural stability without UE support - seated palloff press 4.4# med ball x10 - standing palloff press 4.4# med ball x10 - side stepping along EOM with RW R/L Pt completes stand step transfer with RW with mod assist for postural stability, mod/max cues for sequencing. Pt returns to room and remains seated in [facility] with all needs within reach, cal light in place and chair alarm donned and activated at end of session.     Therapy Documentation Precautions:  Precautions Precautions: Fall Recall of Precautions/Restrictions: Impaired Precaution/Restrictions Comments: R-hemiparesis/hemiplegia Restrictions Weight Bearing Restrictions Per Provider Order: No   Therapy/Group: Individual Therapy  Reche Ohara PT, DPT 04/28/2024, 9:20 AM

## 2024-04-28 NOTE — Progress Notes (Signed)
 Patient ID: Larry Dawson, male   DOB: 12-14-1955, 69 y.o.   MRN: [id number]  SW met with pt in room to provide updates from team conference,and d/c date 2/4.He expresses some concerns about discharge date. Asks about his options if he feels he is not ready to go home at time of discharge. SW discussed appealing discharge date with [facility] OR short term SNF rehab. He is aware SW will follow-up with his wife.   1435-SW spoke with pt wife to inform on above. Wife expresses concerns about his discharge date and feels he is leaving to soon. She states she will be primary caregiver as daughters will be working. She also reports that their bedrooms are upstairs and there are 15 steps.  She feels we are pushing him out and he is leaving too soon. She admits she was not told anything specifically, but she feels he is leaving too soon. Fam edu next wed 9am-12pm.  Graeme Jude, MSW, LCSW Office: [phone] Cell: [phone] Fax: [phone]

## 2024-04-28 NOTE — Plan of Care (Signed)
" °  Problem: RH BOWEL ELIMINATION Goal: RH STG MANAGE BOWEL WITH ASSISTANCE Description: STG Manage Bowel with supervision Assistance. Outcome: Progressing   Problem: RH BLADDER ELIMINATION Goal: RH STG MANAGE BLADDER WITH ASSISTANCE Description: STG Manage Bladder With supervision  Assistance Outcome: Progressing   Problem: RH SAFETY Goal: RH STG ADHERE TO SAFETY PRECAUTIONS W/ASSISTANCE/DEVICE Description: STG Adhere to Safety Precautions With supervision Assistance/Device. Outcome: Progressing   Problem: RH PAIN MANAGEMENT Goal: RH STG PAIN MANAGED AT OR BELOW PT'S PAIN GOAL Description: <4 w/ prn Outcome: Progressing   Problem: RH KNOWLEDGE DEFICIT Goal: RH STG INCREASE KNOWLEDGE OF HYPERTENSION Description: Manage increase knowledge of hypertension with supervision assistance from wife using educational materials provided  Outcome: Progressing   "

## 2024-04-28 NOTE — Progress Notes (Signed)
 Occupational Therapy Session Note  Patient Details  Name: Larry Dawson MRN: [id number] Date of Birth: [birth year]  Today's Date: 04/28/2024 OT Individual Time: 1405-1500 OT Individual Time Calculation (min): 55 min    Short Term Goals: Week 2:  OT Short Term Goal 1 (Week 2): Pt will thread LB garments with Min A. OT Short Term Goal 2 (Week 2): Pt will perform UB care with Min A + Min cues for modification techniques integration. OT Short Term Goal 3 (Week 2): Pt will perform toilet transfer with Mod A (x1).  Skilled Therapeutic Interventions/Progress Updates:  Pt greeted sitting in [facility] for skilled OT session with focus on functional transfers, standing balance, and RUE NMR.   Pain: Pt with no reports of pain. OT offering intermediate rest breaks and positioning suggestions throughout session to address pain/fatigue and maximize participation/safety in session.   Functional Transfers: Sit<>stands during session with Min/Mod A + RW. Stand-step transfer with up to Mod/Max A + RW, patient with increased difficulty motor planning turning towards EOM, requiring +2 for RW management.   Self Care Tasks: No needs this session.   Therapeutic Activities: Pt instructed in standing balance/tolerance activity using BITs modality. Pt requires up to Mod A for standing balance with unilateral support on RW and reach outside BOS. Multimodal cuing provided for terminal hip/R-knee extension during multiple rounds of activities, each last ~4-6 mins. All for carryover into BADLs and functional transfers.   Therapeutic Exercise: In semi-reclined position, pt performs 1 x 10 reps of chest press, shoulder flexion, and shoulder abduction with 1-2# dowel bar. Pt requires assistance for terminal shoulder abduction.   Pt remained sitting in recliner, posey belt activated. 4Ps assessed and immediate needs met. Pt continues to be appropriate for skilled OT intervention to promote further functional independence in  ADLs/IADLs.   Therapy Documentation Precautions:  Precautions Precautions: Fall Recall of Precautions/Restrictions: Impaired Precaution/Restrictions Comments: R-hemiparesis/hemiplegia Restrictions Weight Bearing Restrictions Per Provider Order: No   Therapy/Group: Individual Therapy  Nereida Habermann, OTR/L, MSOT  04/28/2024, 8:11 AM

## 2024-04-28 NOTE — Patient Care Conference (Signed)
 Inpatient RehabilitationTeam Conference and Plan of Care Update Date: 04/28/2024   Time: 1039 am    Patient Name: Larry Dawson      Medical Record Number: [id number]  Date of Birth: [birth year] Sex: Male         Room/Bed: 4W11C/4W11C-01 Payor Info: Payor: [employer] MEDICARE / Plan: [employer] MEDICARE HMO / Product Type: *No Product type* /    Admit Date/Time:  04/20/2024  2:26 PM  Primary Diagnosis:  Cavernoma  Hospital Problems: Principal Problem:   Cavernoma    Expected Discharge Date: Expected Discharge Date: 05/12/24  Team Members Present: Physician leading conference: Dr. Joesph Likes Social Worker Present: Graeme Jude, LCSW Nurse Present: Eulalio Falls, RN PT Present: Catilin Osborn, PT OT Present: Nereida Habermann, OT SLP Present: Recardo Mole, SLP PPS Coordinator present : Eleanor Colon, SLP     Current Status/Progress Goal Weekly Team Focus  Bowel/Bladder   Continent for the most except for occasional periods of incontinence in both B&B   For cessation of these incontent episodes of bowel and bladder.   Assisting patient in toileting while awake    Swallow/Nutrition/ Hydration   regular/thin           ADL's   Min-Mod A for UB care; Max A for LB care; Max A for stand-pivots without use of AD. Barriers: Poor midline orientation, decreased sitting balance/standing balance.   CGA-Min A overall   Functional transfers, sitting/standing balance, RUE/RLE NMR    Mobility   supervision bed mobility, transfers min/mod assist with RW, gait with RW 20' with modA and +2 WC follow, mod/max A gait 180' with B HHA   supervision ambulatory  barriers: R  inattention and poor midline orientation, R lateropulsion/retropulsion; focus on gait training, NMR for R hemi and midline orientation    Communication   mild dysarthria characterized by imprecise articulation and some irregular prosody   modI   pt/family education, generalization of speech strategies     Safety/Cognition/ Behavioral Observations  mild complex attention, problem solving, and reasoning deficits - do anticipate pt personality negatively impacts success   modI   cognitive retraining, pt/fmaily education    Pain   Patient continues to complaint of pain in his right lower extremity   For the pain to decrease   Continue applying topical and giving oral medication    Skin   Skin intact except for surgical site with steri strips  To prevent any skin breakdown  Assess skin each shift      Discharge Planning:  Pt will d/c to home with his wife as primary caregiver. PRN support from his daughters. SW will confirm there are no barriers to discharge.    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 Patient progress limited by vision deficits, poor coordination of upper and lower extremities, poor midline orientation, poor balance, mild dysarthria, attention/ reasoning deficits.   Patient on target to meet rehab goals: yes, currently patient needs min-mod assistance with upper body care and needs mod  assistance with lower body care. Patient was able to ambulate up to 20' with mod assistance and +2 WC follow. Patient with mild dysarthria, good carry over. Goal at discharge are set for min-supervision assistance.   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 [name], postsurgical wound care, diabetes, hypertension, COPD, spasticity, depression  Barriers to Discharge: Behavior/Mood;Medical stability;Self-care education;Uncontrolled Pain   Possible Resolutions to [employer]: treat pain and spasticity with minimally sedating regimen, monitor blood sugars and titrate diabetes regimen, monitor vitals for hypertension and COPD, provide emotional support   Continued Need for Acute Rehabilitation Level of Care: The patient requires daily medical management by a physician with specialized training in physical medicine and rehabilitation for the following reasons: Direction of a multidisciplinary physical rehabilitation program to maximize functional independence : Yes Medical management of patient stability for increased activity during participation in an intensive rehabilitation regime.: Yes Analysis of laboratory values and/or radiology reports with any subsequent need for medication adjustment and/or medical intervention. : Yes   I attest that I was present, lead the team conference, and concur with the assessment and plan of the team.   Ayaana Biondo Gayo 04/28/2024, 1039 am

## 2024-04-29 DIAGNOSIS — F54 Psychological and behavioral factors associated with disorders or diseases classified elsewhere: Secondary | ICD-10-CM | POA: Diagnosis not present

## 2024-04-29 DIAGNOSIS — D1800 Hemangioma unspecified site: Secondary | ICD-10-CM | POA: Diagnosis not present

## 2024-04-29 LAB — GLUCOSE, CAPILLARY
Glucose-Capillary: 118 mg/dL — ABNORMAL HIGH (ref 70–99)
Glucose-Capillary: 128 mg/dL — ABNORMAL HIGH (ref 70–99)
Glucose-Capillary: 115 mg/dL — ABNORMAL HIGH (ref 70–99)
Glucose-Capillary: 138 mg/dL — ABNORMAL HIGH (ref 70–99)

## 2024-04-29 MED ORDER — GABAPENTIN 300 MG PO CAPS: 300.0000 mg | ORAL_CAPSULE | Freq: Every day | ORAL | Status: DC

## 2024-04-29 MED ORDER — GABAPENTIN 300 MG PO CAPS
600.0000 mg | ORAL_CAPSULE | Freq: Every day | ORAL | Status: DC
  Administered 2024-04-29: 600 mg via ORAL
  Filled 2024-04-29: qty 2
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
## 2024-04-29 NOTE — Progress Notes (Signed)
 Physical Therapy Session Note  Patient Details  Name: Larry Dawson MRN: [id number] Date of Birth: Nov 23, 1955  Today's Date: 04/29/2024 PT Individual Time: 9063-8952 + [id number] PT Individual Time Calculation (min): 71 min + 25 min  Short Term Goals: Week 2:  PT Short Term Goal 1 (Week 2): Pt will complete transfers consistently with minA and LRAD PT Short Term Goal 2 (Week 2): Pt will complete gait 150' with min assist and LRAD PT Short Term Goal 3 (Week 2): Pt will complete up/down 4 steps with mod assist  Skilled Therapeutic Interventions/Progress Updates:    SESSION 1: Pt presents in room in [facility] agreeable to PT. Pt does not report pain at this time. Session focused on gait training with RW, NMR for dynamic standing balance and single limb stability, and therapeutic activities to facilitate improved safety/sequencing with transfers as well as self care tasks. Pt transported to main gym via [facility] for time management and energy conservation. Therapist dons posterior leaf spring AFO to trial for gait training, total assist. Pt completes sit to stand transfers with RW with min/mod assist and cues for L hand placement and L lateral lean as pt demonstrating R lateropulsion with standing. Pt then completes gait training 115' with RW with R hemi splint, min/mod assist with mod/max verbal cues for RLE foot placement, one standing rest break with pt able to stand with CGA for postural stability. Pt completes seated rest break then completes NMR for BLE strengthening, single limb stability, and midline orientation with BUE support on handrails on 6 step including: - step taps LLE x10 (therapist providing max verbal/tactile cues and min assist for R knee stability) - step taps RLE x10 (therapist assist with RLE coordination onto step with mod cues) - step ups x5 LLE Pt positioned in //bars and completes side stepping bilaterally 3x6' with BUE support on //bar, completed to promote BLE coordination. Pt  requests to use restroom, returned to room dependently in [facility] and positioned in bathroom, and completes stand step transfer with heavy mod/max assist to toilet, requires max assist for clothing management. Pt continent of BM, charted, and stands to complete perirarea hygiene with mod/max assist for postural stability with pt demonstrating excessive R lateral lean, pt using LUE for hygiene. Pt then completes lower body dressing with mod/max assist for postural stability and min assist for managing underwear and pants over hips. Pt completes gait with RW with mod assist back to WC ~10'. Pt set up at sink to complete hand hygiene in sitting, requires min cues for sequencing soap for hand hygiene. Pt remains seated in WC with all needs within reach, cal light in place and chair alarm donned and activated at end of session.   SESSION 2: Pt presents in room in [facility], agreeable to PT. Pt denies pain. Session focused on gait training with hand held assist for upright tolerance and gait mechanics as well as multidirectional stepping mechanics. Pt transported to day room dependently in WC. Pt completes gait 180' with mod assist x2 B HHA with mod verbal cues for BLE foot positioning and L lateral lean with pt demonstrating step to gait pattern with decreased stance time on RLE. Pt completes seated rest break and completes forward/backward walking 2x5' with max assist x2 for postural stability as pt demonstrating retropulsion, max verbal cues for sequencing and for midline positioning with pt demonstrating understanding. Pt returns to room and remains seated in [facility] with all needs within reach, cal light in place and chair alarm  donned and activated at end of session.    Therapy Documentation Precautions:  Precautions Precautions: Fall Recall of Precautions/Restrictions: Impaired Precaution/Restrictions Comments: R-hemiparesis/hemiplegia Restrictions Weight Bearing Restrictions Per Provider Order:  No   Therapy/Group: Individual Therapy  Reche Ohara PT, DPT 04/29/2024, 12:28 PM

## 2024-04-29 NOTE — Plan of Care (Signed)
" °  Problem: Consults Goal: RH STROKE PATIENT EDUCATION Description: See Patient Education module for education specifics  Outcome: Progressing   Problem: RH BOWEL ELIMINATION Goal: RH STG MANAGE BOWEL WITH ASSISTANCE Description: STG Manage Bowel with supervision Assistance. Outcome: Progressing   Problem: RH BLADDER ELIMINATION Goal: RH STG MANAGE BLADDER WITH ASSISTANCE Description: STG Manage Bladder With supervision  Assistance Outcome: Progressing   Problem: RH SKIN INTEGRITY Goal: RH STG SKIN FREE OF INFECTION/BREAKDOWN Description: Skin free of infection with supervision assistance Outcome: Progressing   Problem: RH SAFETY Goal: RH STG ADHERE TO SAFETY PRECAUTIONS W/ASSISTANCE/DEVICE Description: STG Adhere to Safety Precautions With supervision Assistance/Device. Outcome: Progressing   Problem: RH PAIN MANAGEMENT Goal: RH STG PAIN MANAGED AT OR BELOW PT'S PAIN GOAL Description: <4 w/ prn Outcome: Progressing   Problem: RH KNOWLEDGE DEFICIT Goal: RH STG INCREASE KNOWLEDGE OF DIABETES Description: Manage increase knowledge of diabetes with supervision assistance from wife using educational materials provided  Outcome: Progressing Goal: RH STG INCREASE KNOWLEDGE OF HYPERTENSION Description: Manage increase knowledge of hypertension with supervision assistance from wife using educational materials provided  Outcome: Progressing   "

## 2024-04-29 NOTE — Progress Notes (Signed)
 Occupational Therapy Session Note  Patient Details  Name: Larry Dawson MRN: [id number] Date of Birth: [birth year]  Today's Date: 04/29/2024 OT Individual Time: 0805-0900 OT Individual Time Calculation (min): 55 min   Today's Date: 04/29/2024 OT Individual Time: 1450-1530 OT Individual Time Calculation (min): 40 min    Short Term Goals: Week 2:  OT Short Term Goal 1 (Week 2): Pt will thread LB garments with Min A. OT Short Term Goal 2 (Week 2): Pt will perform UB care with Min A + Min cues for modification techniques integration. OT Short Term Goal 3 (Week 2): Pt will perform toilet transfer with Mod A (x1).  Skilled Therapeutic Interventions/Progress Updates:   Session 1: Pt greeted resting in bed for skilled OT session with focus on functional transfers and BADL retraining at shower level.   Pain: Pt with no reports of pain. OT offering intermediate rest breaks and positioning suggestions throughout session to address pain/fatigue and maximize participation/safety in session.   Functional Transfers: Bed mobility with Min A for trunk elevation. Sit<>stands with up to Mod A due to posterior/lateral bias. Stand-pivots with Mod A (x1) + no AD. Use of grab bar for walk-in shower transfer. Pt continues to require verbal cuing for safe pacing and manual positioning of RLE for improved mechanics.   Self Care Tasks: Pt completes the following self care tasks with levels of assistance noted below, UB: Bathing with distal support at RUE to improve functional reach. Dressing with light Min A (due to material entanglement), improved carryover of hemi-dressing techniques.  LB: Bathing with assistance to reach distal LE (due to decreased sitting balance) and posterior pericare in standing. Pt educated on use of figure-4 technique to thread RLE into LB garments, OT assists with positioning and material entanglement. Standing hike with Min A for task, up to Mod A for standing balance. Dependent for  footwear/AFO management due to time constraints.  Sink-side oral care with setup A, observed to be using R-hand at diminished level through ADLs.   Pt remained sitting in WC with 4Ps assessed and immediate needs met. Pt continues to be appropriate for skilled OT intervention to promote further functional independence in ADLs/IADLs.   Session 2: Pt greeted sitting in [facility] for skilled OT session with focus on functional transfers, RLE/RUE NMR, and dynamic sitting balance.   Pain: Pt with no reports of pain. OT offering intermediate rest breaks and positioning suggestions throughout session to address pain/fatigue and maximize participation/safety in session.   Functional Transfers: Stand-pivots with Mod A (x1) + no AD.   Self Care Tasks: No needs this session.   Therapeutic Activities: Pt instructed in blocked sit<>stands (x5 reps) with LLE elevated on 2-in step for improved WB/weight-shift through LLE. Pt initially required heavy Mod A, improving with repetitions. Stand balance with Min-Mod A + unilateral support on OT.   Patient then completed an activity addressing dynamic sitting balance and functional reach/grasp, for improved performance in LB dressing and bathing tasks. Patient was seated EOM on a therapy disk and required Min-A to maintain balance while reaching laterally and distally for cones. The patient experienced a lateral loss of balance when reaching distally for a cone placed behind his RLE. Decreased reaction time was noted as he required Mod-A to regain balance. Patient experienced difficulties maintaining grasp of cones with the RUE, and required hand-over-hand assistance to complete the task.  Pt remained sitting in WC with 4Ps assessed and immediate needs met. Pt continues to be appropriate for skilled OT  intervention to promote further functional independence in ADLs/IADLs.   Therapy Documentation Precautions:  Precautions Precautions: Fall Recall of  Precautions/Restrictions: Impaired Precaution/Restrictions Comments: R-hemiparesis/hemiplegia Restrictions Weight Bearing Restrictions Per Provider Order: No   Therapy/Group: Individual Therapy  Nereida Habermann, OTR/L, MSOT  04/29/2024, 6:24 AM

## 2024-04-29 NOTE — Progress Notes (Signed)
 "                                                        PROGRESS NOTE   Subjective/Complaints: No events overnight.  Has ongoing complaints of aching pain in his right calf, which do wake him up.  Also, complaining of increased paresthesias in the right 4th and 5th digits, but increasing range of motion as well.  Did not have CPAP last night Eating 50% of meals consistently.  Continent of bowel and bladder. Bowel movement 1-18, medium.  ROS: Patient denies fever, new vision changes, dizziness, nausea, vomiting, diarrhea,  shortness of breath or chest pain, headache, or mood change.  Objective:   VAS US  LOWER EXTREMITY VENOUS (DVT) Result Date: 04/28/2024  Lower Venous DVT Study Patient Name:  Larry Dawson  Date of Exam:   04/27/2024 Medical Rec #: [id number]        Accession #:    [id number] Date of Birth: [birth year]        Patient Gender: M Patient Age:   69 years Exam Location:  [facility] Procedure:      VAS US  LOWER EXTREMITY VENOUS (DVT) Referring Phys: MURRAY COLLIER --------------------------------------------------------------------------------  Indications: Pain, and weakness, tightness.  Comparison Study: No prior exam. Performing Technologist: Edilia Elden Appl  Examination Guidelines: A complete evaluation includes B-mode imaging, spectral Doppler, color Doppler, and power Doppler as needed of all accessible portions of each vessel. Bilateral testing is considered an integral part of a complete examination. Limited examinations for reoccurring indications may be performed as noted. The reflux portion of the exam is performed with the patient in reverse Trendelenburg.  +---------+---------------+---------+-----------+----------+--------------+ RIGHT    CompressibilityPhasicitySpontaneityPropertiesThrombus Aging +---------+---------------+---------+-----------+----------+--------------+ CFV      Full           Yes      Yes                                  +---------+---------------+---------+-----------+----------+--------------+ SFJ      Full           Yes      Yes                                 +---------+---------------+---------+-----------+----------+--------------+ FV Prox  Full                                                        +---------+---------------+---------+-----------+----------+--------------+ FV Mid   Full                                                        +---------+---------------+---------+-----------+----------+--------------+ FV DistalFull                                                        +---------+---------------+---------+-----------+----------+--------------+  PFV      Full                                                        +---------+---------------+---------+-----------+----------+--------------+ POP      Full           No       No                                  +---------+---------------+---------+-----------+----------+--------------+ PTV      Full                                                        +---------+---------------+---------+-----------+----------+--------------+ PERO     Full                                                        +---------+---------------+---------+-----------+----------+--------------+   +----+---------------+---------+-----------+----------+--------------+ LEFTCompressibilityPhasicitySpontaneityPropertiesThrombus Aging +----+---------------+---------+-----------+----------+--------------+ CFV Full           Yes      Yes                                 +----+---------------+---------+-----------+----------+--------------+ SFJ Full           Yes      Yes                                 +----+---------------+---------+-----------+----------+--------------+     Summary: RIGHT: - There is no evidence of deep vein thrombosis in the lower extremity.  - No cystic structure found in the popliteal fossa.  LEFT: - No  evidence of common femoral vein obstruction.   *See table(s) above for measurements and observations. Electronically signed by Penne Colorado MD on 04/28/2024 at 10:11:47 AM.    Final    No results for input(s): WBC, HGB, HCT, PLT in the last 72 hours.  No results for input(s): NA, K, CL, CO2, GLUCOSE, BUN, CREATININE, CALCIUM  in the last 72 hours.   Intake/Output Summary (Last 24 hours) at 04/29/2024 0854 Last data filed at 04/29/2024 9191 Gross per 24 hour  Intake 720 ml  Output 525 ml  Net 195 ml        Physical Exam: Vital Signs Blood pressure 107/65, pulse (!) 57, temperature 97.7 F (36.5 C), temperature source Oral, resp. rate 16, height 5' 7 (1.702 m), weight 95.7 kg, SpO2 98%.  Physical Exam  Constitutional: No apparent distress. Appropriate appearance for age.  Sitting upright in bedside chair.  HENT: Status post left basal craniotomy, site with minimal edema. Eyes: PERRLA.  Heavy nystagmus with left greater than rightward gaze, left glasses taped to improve diplopia. Cardiovascular: RRR, no murmurs/rub/gallops. No Edema. Peripheral pulses 2+  Respiratory: CTAB. No rales, rhonchi, or wheezing. On RA.  Abdomen: + bowel sounds, normoactive. No distention or tenderness.  Skin: Posterior surgical site appears to be healing well--Area is reinforced with Steri-Strips --stable   MSK:      No apparent deformity. + Right ankle AFO      Mild to moderate right leg tenderness on Palpation.  Neurologic exam:  Cognition: AAO to person, place, time and event.  Language: Fluent, No substitutions or neoglisms. No dysarthria. Names 3/3 objects correctly.  Memory:  No apparent deficits  Insight: Good  insight into current condition.  Mood: Pleasant affect, appropriate mood.  Sensation: Reduced to light touch throughout the right hemibody Reflexes: 2+ in BL UE and LEs. Negative Hoffman's and babinski signs bilaterally.  CN: Left sided vision deficits.  Mild  facial droop.   Coordination: Upper and lower extremity ataxia. Spasticity: No apparent spasticity, developing some tightness in the right hand.       Strength:                RUE: 3/5 SA, 3/5 EF, 3/5 EE, 4/5 WE, 4+/5 FF (5-/5 in 4-5th digit), 4/5 FA                LUE:  5/5 SA, 5/5 EF, 5/5 EE, 5/5 WE, 5/5 FF, 5/5 FA                RLE: 3/5 HF, 3/5 KE, 0/5  DF, 0/5  EHL, 4/5  PF                 LLE:  5/5 HF, 5/5 KE, 5/5  DF, 5/5  EHL, 5/5  PF   Assessment/Plan: 1. Functional deficits which require 3+ hours per day of interdisciplinary therapy in a comprehensive inpatient rehab setting. Physiatrist is providing close team supervision and 24 hour management of active medical problems listed below. Physiatrist and rehab team continue to assess barriers to discharge/monitor patient progress toward functional and medical goals  Care Tool:  Bathing    Body parts bathed by patient: Right arm, Chest, Abdomen, Front perineal area, Right upper leg, Left upper leg, Face   Body parts bathed by helper: Left arm, Right lower leg, Left lower leg, Buttocks     Bathing assist Assist Level: Moderate Assistance - Patient 50 - 74%     Upper Body Dressing/Undressing Upper body dressing   What is the patient wearing?: Pull over shirt    Upper body assist Assist Level: Minimal Assistance - Patient > 75%    Lower Body Dressing/Undressing Lower body dressing      What is the patient wearing?: Pants     Lower body assist Assist for lower body dressing: 2 Helpers     Toileting Toileting    Toileting assist Assist for toileting: 2 Helpers     Transfers Chair/bed transfer  Transfers assist     Chair/bed transfer assist level: Moderate Assistance - Patient 50 - 74%     Locomotion Ambulation   Ambulation assist      Assist level: 2 helpers Assistive device: Other (comment) (hand rail) Max distance: 30   Walk 10 feet activity   Assist     Assist level: 2 helpers Assistive  device: Other (comment) (hand rail)   Walk 50 feet activity   Assist Walk 50 feet with 2 turns activity did not occur: Safety/medical concerns         Walk 150 feet activity   Assist Walk 150 feet activity did not occur: Safety/medical concerns         Walk 10 feet on uneven surface  activity   Assist Walk 10 feet on uneven surfaces activity did not occur: Safety/medical concerns         Wheelchair     Assist Is the patient using a wheelchair?: Yes Type of Wheelchair: Manual    Wheelchair assist level: Maximal Assistance - Patient 25 - 49% Max wheelchair distance: 55'    Wheelchair 50 feet with 2 turns activity    Assist        Assist Level: Maximal Assistance - Patient 25 - 49%   Wheelchair 150 feet activity     Assist      Assist Level: Total Assistance - Patient < 25%   Blood pressure 107/65, pulse (!) 57, temperature 97.7 F (36.5 C), temperature source Oral, resp. rate 16, height 5' 7 (1.702 m), weight 95.7 kg, SpO2 98%.    Medical Problem List and Plan: 1. 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 Janjua             -patient may  shower with shower cap             -ELOS/Goals: 14-16d -CGA to Min A goals -DC date 05/12/24  - Stable to continue inpatient rehab   - 1/13: Going home with 2 daughters, one is CNA. Min to Mod A UB and Max A LB ADLs; poor body orientation. Other evals pending.    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  He is understanding   - 1/20: Min A transfers and walker 130 ft yesterday; Mod- Max A today handheld assist due to fatigue. Min-Mod A UB care, Mod A LB care due to poor  midline orientation and balance. Mild dysarthria and aprosity. Some attention/reasoning deficits c/b personality.  - Likely custom AFO needed closer to discharge    2.  Antithrombotics: -DVT/anticoagulation:  Pharmaceutical: Heparin  initiated 04/14/2024             -antiplatelet therapy: N/A  1-21: Duplex right lower extremity negative  3. Pain Management: Tylenol  as needed   -1-13 discussed positioning off of surgical site at nighttime.  Pain well-controlled on current regimen.  - 1-14: Voltaren  gel 4 times daily to right ankle and calf.  Discussed bracing with therapies.  Add baclofen  5 mg 3 times daily for developing tightness/muscle aching on the right hemibody.  Consider right hip x-ray for arthritis if groin pain becomes worse.  1/15: Pain much improved with current regimen, monitor  1-17: Occipital nerve distribution of odd sensations across his head, not painful, does not want additional treatments at this time  -1/19 Will order Vas US  r/o DVT but think it is less likely--ultrasound negative  1-20: Increase baclofen  to 10 mg 3 times daily--improved pain tolerance  1-21: Increase nightly gabapentin  to 600 mg for calf and hand pain  4. Mood/Behavior/Sleep: Provide emotional support             -antipsychotic agents: N/A   - sleeping well, apporpriate  5. Neuropsych/cognition: This patient is capable of making decisions on his own behalf.  6. Skin/Wound Care: Routine skin checks  - 1-14:   will reach out to Dr. Janjua regarding DC staples POD 10 tomorrow--OK, order placed 1/15  1-16:  Some uneven edges on the surgical line in the lower portion, reinforced with Steri-Strips today.  No drainage or open wound bed, no concern for dehiscence or infection--looking much better   7. Fluids/Electrolytes/Nutrition: Routine and analysis with follow-up chemistries   - labs stable on admission 8.  Diabetes mellitus.  Hemoglobin A1c 7.2.  Glucophage  500 mg twice daily, Lantus  insulin  8 units  nightly Recent Labs    04/28/24 1641 04/28/24 2045 04/29/24 0521  GLUCAP 113* 139* 118*    - 1-18: Blood sugars well-controlled, have not resumed home Lantus .  Continue as needed insulin , using rarely, may be able to reduce blood glucose checks this week -1/19 well-controlled continue current regimen and monitor   9.  Hyperlipidemia.  Crestor  10.  Hypertension.  Norvasc  5 mg daily, Tenormin  50 mg daily, monitor with increased mobility -Blood pressure, vital stable.    04/29/2024    5:18 AM 04/28/2024    7:23 PM 04/28/2024    1:15 PM  Vitals with BMI  Systolic 107 113 875  Diastolic 65 72 70  Pulse 57 63 98    11.  Class II obesity.  BMI 35.29.  Dietary follow-up 12.  Constipation/IBS.  MiraLAX  daily, Senokot 1 tablet nightly   - 1/12 LBM--may need laxatives increase tomorrow if no improvement  Last bowel movement 1-18  13.  COPD.  History of tobacco use.  Monitor oxygen saturations.  Discussed no nicotine.  1-20: Patient to bring in portable CPAP machine from home; nursing informed to assess and set up--pending  14. Urinary urgency. Wean off purwick.    - no incontinence  15. Left vision deficits.  Continue glasses taping, therapies.  - 1-17: Patient endorses double vision has resolved today.  16.  Gastric reflux.  Resume Pepcid  40 mg daily, Tums 3 times daily as needed   -Symptoms well-controlled on current regimen LOS: 9 days A FACE TO FACE EVALUATION WAS PERFORMED  Joesph JAYSON Likes 04/29/2024, 8:54 AM     "

## 2024-04-30 ENCOUNTER — Institutional Professional Consult (permissible substitution): Admitting: Neurology

## 2024-04-30 DIAGNOSIS — D1800 Hemangioma unspecified site: Secondary | ICD-10-CM | POA: Diagnosis not present

## 2024-04-30 LAB — CBC WITH DIFFERENTIAL/PLATELET
Abs Immature Granulocytes: 0.02 K/uL (ref 0.00–0.07)
Basophils Absolute: 0.1 K/uL (ref 0.0–0.1)
Basophils Relative: 1 %
Eosinophils Absolute: 0.1 K/uL (ref 0.0–0.5)
Eosinophils Relative: 2 %
HCT: 45.1 % (ref 39.0–52.0)
Hemoglobin: 14.9 g/dL (ref 13.0–17.0)
Immature Granulocytes: 0 %
Lymphocytes Relative: 17 %
Lymphs Abs: 1.3 K/uL (ref 0.7–4.0)
MCH: 28.1 pg (ref 26.0–34.0)
MCHC: 33 g/dL (ref 30.0–36.0)
MCV: 85.1 fL (ref 80.0–100.0)
Monocytes Absolute: 0.5 K/uL (ref 0.1–1.0)
Monocytes Relative: 7 %
Neutro Abs: 5.4 K/uL (ref 1.7–7.7)
Neutrophils Relative %: 73 %
Platelets: 296 K/uL (ref 150–400)
RBC: 5.3 MIL/uL (ref 4.22–5.81)
RDW: 13.2 % (ref 11.5–15.5)
WBC: 7.5 K/uL (ref 4.0–10.5)
nRBC: 0 % (ref 0.0–0.2)

## 2024-04-30 LAB — BASIC METABOLIC PANEL WITH GFR
Anion gap: 11 (ref 5–15)
BUN: 11 mg/dL (ref 8–23)
CO2: 28 mmol/L (ref 22–32)
Calcium: 9.6 mg/dL (ref 8.9–10.3)
Chloride: 102 mmol/L (ref 98–111)
Creatinine, Ser: 1.17 mg/dL (ref 0.61–1.24)
GFR, Estimated: >60 mL/min
Glucose, Bld: 156 mg/dL — ABNORMAL HIGH (ref 70–99)
Potassium: 4.3 mmol/L (ref 3.5–5.1)
Sodium: 140 mmol/L (ref 135–145)

## 2024-04-30 LAB — GLUCOSE, CAPILLARY
Glucose-Capillary: 176 mg/dL — ABNORMAL HIGH (ref 70–99)
Glucose-Capillary: 124 mg/dL — ABNORMAL HIGH (ref 70–99)
Glucose-Capillary: 132 mg/dL — ABNORMAL HIGH (ref 70–99)
Glucose-Capillary: 105 mg/dL — ABNORMAL HIGH (ref 70–99)

## 2024-04-30 MED ORDER — METHOCARBAMOL 500 MG PO TABS: 500.0000 mg | ORAL_TABLET | Freq: Four times a day (QID) | ORAL | Status: DC | PRN

## 2024-04-30 MED ORDER — GABAPENTIN 300 MG PO CAPS
300.0000 mg | ORAL_CAPSULE | Freq: Every day | ORAL | Status: DC
  Administered 2024-04-30 – 2024-05-03 (×4): 300 mg via ORAL
  Filled 2024-04-30 (×4): qty 1

## 2024-04-30 MED ORDER — BACLOFEN 5 MG HALF TABLET
5.0000 mg | ORAL_TABLET | Freq: Three times a day (TID) | ORAL | Status: DC
  Administered 2024-04-30 – 2024-05-07 (×21): 5 mg via ORAL
  Filled 2024-04-30 (×21): qty 1

## 2024-04-30 NOTE — Progress Notes (Signed)
 This RN notified providers of increased right side weakness and slurred speech observed with morning nursing assessment and reported by patient. Provider assessed patient, no new orders currently. Gabapentin  and Baclofen  increased recently and could be a contributing factor.

## 2024-04-30 NOTE — Plan of Care (Signed)
" °  Problem: Consults Goal: RH STROKE PATIENT EDUCATION Description: See Patient Education module for education specifics  Outcome: Progressing   Problem: RH BOWEL ELIMINATION Goal: RH STG MANAGE BOWEL WITH ASSISTANCE Description: STG Manage Bowel with supervision Assistance. Outcome: Progressing   Problem: RH BLADDER ELIMINATION Goal: RH STG MANAGE BLADDER WITH ASSISTANCE Description: STG Manage Bladder With supervision  Assistance Outcome: Progressing   Problem: RH SKIN INTEGRITY Goal: RH STG SKIN FREE OF INFECTION/BREAKDOWN Description: Skin free of infection with supervision assistance Outcome: Progressing   Problem: RH SAFETY Goal: RH STG ADHERE TO SAFETY PRECAUTIONS W/ASSISTANCE/DEVICE Description: STG Adhere to Safety Precautions With supervision Assistance/Device. Outcome: Progressing   "

## 2024-04-30 NOTE — Progress Notes (Signed)
 Physical Therapy Session Note  Patient Details  Name: Larry Dawson MRN: [id number] Date of Birth: May 26, 1955  Today's Date: 04/30/2024 PT Individual Time: 0805-0915 PT Individual Time Calculation (min): 70 min   Short Term Goals: Week 2:  PT Short Term Goal 1 (Week 2): Pt will complete transfers consistently with minA and LRAD PT Short Term Goal 2 (Week 2): Pt will complete gait 150' with min assist and LRAD PT Short Term Goal 3 (Week 2): Pt will complete up/down 4 steps with mod assist  Skilled Therapeutic Interventions/Progress Updates:    Pt presents in room in bed, asleep requires increased time to wake up with pt demonstrating drowsiness at start of session. Pt states he slept poorly due to back pain, states premedicated, agreeable to trial heat with RN notified to order kpad. Session focused on therapeutic activities to promote improved bed mobility, self care tasks, transfer training and upright tolerance as well as NMR for BUE/BLE coordination and gait training. Pt completes bed mobility slowly with supervision with hospital bed features to L side of bed. Pt requires cueing for scooting to EOB to maintain balance with BLE foot support on floor. Pt completes donning shirt with increased time, min cues for sequencing but supervision only. Pt requires mod assist for donning pants for threading BLEs and pulling  up over hips in standing. Therapist dons shoes and socks as well as PLS AFO with total assist for time management. Pt completes sit to stand with min assist to RW, completes stand step transfer bed to [facility] with min/mod assist to R improved sequencing. Pt requests to complete oral and face hygiene to wake up, requires cues for sequencing for oral hygiene and assist for opening toothpaste and mouthwash. Pt transported to day room dependently. Pt completes sit to stand to RW with heavy min assist. Pt then ambulates with mod assist 48' with RW and +2 WC follow. Pt demonstrating increased R  knee flexion in stance phase, continues to demonstrate R lateropulsion in standing requiring max verbal/tactile cues for sequencing. Pt then completes one trial forward/backward walking with mod assist x2 bilateral HHA with max cues for sequencing and midline orientation. Pt takes seated rest break then participates with mass practice stand step transfers with RW, pt requires mod/max assist for postural stability during transfer with max verbal cues for sequencing and slowling down as pt impulsively begins stepping unsafely prior to cues from therapist. Pt returns to room and remains seated in [facility] with all needs within reach, cal light in place and chair alarm donned and activated at end of session.   Therapy Documentation Precautions:  Precautions Precautions: Fall Recall of Precautions/Restrictions: Impaired Precaution/Restrictions Comments: R-hemiparesis/hemiplegia Restrictions Weight Bearing Restrictions Per Provider Order: No   Therapy/Group: Individual Therapy  Reche Ohara PT, DPT 04/30/2024, 9:18 AM

## 2024-04-30 NOTE — Progress Notes (Signed)
 Occupational Therapy Session Note  Patient Details  Name: Larry Dawson MRN: [id number] Date of Birth: Feb 07, 1956  Today's Date: 04/30/2024 OT Individual Time: 1105-1200 OT Individual Time Calculation (min): 55 min   Today's Date: 04/30/2024 OT Individual Time: 1350-1445 OT Individual Time Calculation (min): 55 min   Short Term Goals: Week 2:  OT Short Term Goal 1 (Week 2): Pt will thread LB garments with Min A. OT Short Term Goal 2 (Week 2): Pt will perform UB care with Min A + Min cues for modification techniques integration. OT Short Term Goal 3 (Week 2): Pt will perform toilet transfer with Mod A (x1).  Skilled Therapeutic Interventions/Progress Updates:   Session 1: Pt greeted in W/C, attempting to reach for his urinal, for skilled OT session with focus on toileting and RUE/RLE NMR.   Pain: Pt reported un-rated pain, stating he did not have a good night and feels groggy. OTS offering intermediate rest breaks and positioning suggestions throughout session to address pain/fatigue and maximize participation/safety in session.   Functional Transfers: Patient required Mod-A for sit-to-stand from W/C, and Max-A to control lean to right side and descent onto toilet. Increased cuing necessary for safe RUE/RLE placement.   Self Care Tasks: Pt completes the following self care tasks with levels of assistance noted below, Toileting: Patient completed 3/3 toileting tasks with Max-A, +2 presented for safety due to increased drowsiness and poor postural control with standing. Extended time was provided but the patient was not successful with B/B void.   Therapeutic Activities: Patient participated in seated dance group with visual model to address RUE/RLE motor planning. Patient was able to follow demonstration with moderate multimodal cuing for attention to R hemibody. Manual facilitation provided to achieve improved ROM of RUE. Bright affect noted throughout activity.  Pt remained in W/C  with 4Ps assessed and immediate needs met. Pt continues to be appropriate for skilled OT intervention to promote further functional independence in ADLs/IADLs.   Session 2: Pt greeted in W/C for skilled OT session with focus on NMR of the RUE/RLE.   Pain: Pt reported un-rated groin pain during RUE lateral WB therac, OT offering intermediate rest breaks and positioning suggestions throughout activity/session to address pain/fatigue and maximize participation/safety in session.   Functional Transfers: Patient required Mod-A for sit-to-stand from W/C and EOM. Patient required Max-A to control lean to the right side and descent onto the EOM. Series of sit-to-stands were completed throughout session requiring Min to Mod-A for hemibody positioning and improved mechanics of transfers.   Therapeutic Activities: Patient completed an activity to improve motor planning and coordination of RLE for transfers. Patient was in a standing position and instructed to place RLE on targets positioned 1 ft in front of him on the floor. Multimodal cues including verbal and tactile x1 were provided for RLE positioning and coordination. Mod-A was provided to maintain the standing position throughout. Pt experienced x1 R lateral LOB requiring Max A (Min A +2) to return to midline.   Patient completed lateral WB activity for NMR of the RUE. Patient required Mod-A to position RUE on mat and maintain alignment of the [facility] joint while reaching with LUE for objects on the floor. Once object was in-hand, patient required proprioceptive input to R distal extremity to elicit proper extension of tricep. Verbal cues to push through your arm were required to manage overcompensation with trunk.   Pt remained in [facility] with 4Ps assessed and immediate needs met. Pt continues to be appropriate for skilled  OT intervention to promote further functional independence in ADLs/IADLs.    Therapy Documentation Precautions:  Precautions Precautions:  Fall Recall of Precautions/Restrictions: Impaired Precaution/Restrictions Comments: R-hemiparesis/hemiplegia Restrictions Weight Bearing Restrictions Per Provider Order: No   Therapy/Group: Individual Therapy  Misty Pacini, OTS  04/30/2024, 7:53 AM

## 2024-04-30 NOTE — Progress Notes (Signed)
 "                                                        PROGRESS NOTE   Subjective/Complaints:  Vitals are stable.  A.m. labs with slight increase in creatinine, BUN stable/downtrending, other labs stable.  Some complaints of back pain overnight, along with ongoing discomfort in his right calf.  Unchanged from prior exams.  Complains of feeling drunk this morning, more slurring of speech and global weakness.  No new neurologic deficits on evaluation, discussed likely from gabapentin  increase overnight.  ROS: Patient denies fever, new vision changes, dizziness, nausea, vomiting, diarrhea,  shortness of breath or chest pain, headache, or mood change. + Fatigue, feeling drunk + Right calf pain  Objective:   No results found.  Recent Labs    04/30/24 1049  WBC 7.5  HGB 14.9  HCT 45.1  PLT 296    Recent Labs    04/30/24 1049  NA 140  K 4.3  CL 102  CO2 28  GLUCOSE 156*  BUN 11  CREATININE 1.17  CALCIUM  9.6     Intake/Output Summary (Last 24 hours) at 04/30/2024 1604 Last data filed at 04/30/2024 1242 Gross per 24 hour  Intake 840 ml  Output 1050 ml  Net -210 ml        Physical Exam: Vital Signs Blood pressure 123/67, pulse 63, temperature (!) 97.5 F (36.4 C), temperature source Oral, resp. rate 18, height 5' 7 (1.702 m), weight 95.7 kg, SpO2 100%.  Physical Exam  Constitutional: No apparent distress. Appropriate appearance for age.  Sitting on toilet.  HENT: Status post left basal craniotomy, site with minimal edema. Eyes: PERRLA.  EOMI.  No obvious nystagmus. Cardiovascular: RRR, no murmurs/rub/gallops. No Edema. Peripheral pulses 2+  Respiratory: CTAB. No rales, rhonchi, or wheezing. On RA.  Abdomen: + bowel sounds, normoactive. No distention or tenderness.  Skin: Posterior surgical site appears to be healing well    MSK:      No apparent deformity. + Right ankle AFO      Mild to moderate right leg tenderness on Palpation--unchanged  1-22  Neurologic exam:  Cognition: AAO to person, place, time and event.  + Slight cognitive delay today Language: Fluent, No substitutions or neoglisms.  Mild dysarthria  Insight: Good  insight into current condition.  Mood: Pleasant affect, appropriate mood.  Sensation: Reduced to light touch throughout the right hemibody Reflexes: 2+ in BL UE and LEs. Negative Hoffman's and babinski signs bilaterally.  CN:   Mild facial droop.   Coordination: Right upper and lower extremity ataxia, complicated by weakness Spasticity: MAS 1 right wrist flexors, finger flexors, elbow flexors, plantar flexors       Strength:                RUE: 3/5 SA, 3/5 EF, 3/5 EE, 4/5 WE, 4+/5 FF (5-/5 in 4-5th digit), 4/5 FA                LUE:  5/5 SA, 5/5 EF, 5/5 EE, 5/5 WE, 5/5 FF, 5/5 FA                RLE: 3/5 HF, 3/5 KE, 0/5  DF, 0/5  EHL, 4/5  PF  LLE:  5/5 HF, 5/5 KE, 5/5  DF, 5/5  EHL, 5/5  PF   Assessment/Plan: 1. Functional deficits which require 3+ hours per day of interdisciplinary therapy in a comprehensive inpatient rehab setting. Physiatrist is providing close team supervision and 24 hour management of active medical problems listed below. Physiatrist and rehab team continue to assess barriers to discharge/monitor patient progress toward functional and medical goals  Care Tool:  Bathing    Body parts bathed by patient: Right arm, Chest, Abdomen, Front perineal area, Right upper leg, Left upper leg, Face   Body parts bathed by helper: Left arm, Right lower leg, Left lower leg, Buttocks     Bathing assist Assist Level: Moderate Assistance - Patient 50 - 74%     Upper Body Dressing/Undressing Upper body dressing   What is the patient wearing?: Pull over shirt    Upper body assist Assist Level: Minimal Assistance - Patient > 75%    Lower Body Dressing/Undressing Lower body dressing      What is the patient wearing?: Pants     Lower body assist Assist for lower body  dressing: 2 Helpers     Toileting Toileting    Toileting assist Assist for toileting: 2 Helpers     Transfers Chair/bed transfer  Transfers assist     Chair/bed transfer assist level: Moderate Assistance - Patient 50 - 74%     Locomotion Ambulation   Ambulation assist      Assist level: 2 helpers Assistive device: Other (comment) (hand rail) Max distance: 30   Walk 10 feet activity   Assist     Assist level: 2 helpers Assistive device: Other (comment) (hand rail)   Walk 50 feet activity   Assist Walk 50 feet with 2 turns activity did not occur: Safety/medical concerns         Walk 150 feet activity   Assist Walk 150 feet activity did not occur: Safety/medical concerns         Walk 10 feet on uneven surface  activity   Assist Walk 10 feet on uneven surfaces activity did not occur: Safety/medical concerns         Wheelchair     Assist Is the patient using a wheelchair?: Yes Type of Wheelchair: Manual    Wheelchair assist level: Maximal Assistance - Patient 25 - 49% Max wheelchair distance: 30'    Wheelchair 50 feet with 2 turns activity    Assist        Assist Level: Maximal Assistance - Patient 25 - 49%   Wheelchair 150 feet activity     Assist      Assist Level: Total Assistance - Patient < 25%   Blood pressure 123/67, pulse 63, temperature (!) 97.5 F (36.4 C), temperature source Oral, resp. rate 18, height 5' 7 (1.702 m), weight 95.7 kg, SpO2 100%.    Medical Problem List and Plan: 1. 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 Janjua             -patient may  shower with shower cap             -ELOS/Goals: 14-16d -CGA to Min A goals -DC date 05/12/24  - Stable to continue inpatient rehab   - 1/13: Going home with 2 daughters, one is CNA. Min to Mod A UB and Max A LB ADLs; poor body orientation. Other evals pending.    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  He is understanding   - 1/20: Min A transfers and walker 130 ft yesterday; Mod- Max A today handheld assist due to fatigue. Min-Mod A UB care, Mod A LB care due to poor midline orientation and balance. Mild dysarthria and aprosity. Some attention/reasoning deficits c/b personality.  - Likely custom AFO needed closer to discharge    2.  Antithrombotics: -DVT/anticoagulation:  Pharmaceutical: Heparin  initiated 04/14/2024             -antiplatelet therapy: N/A  1-21: Duplex right lower extremity negative  3. Pain Management: Tylenol  as needed   -1-13 discussed positioning off of surgical site at nighttime.  Pain well-controlled on current regimen.  - 1-14: Voltaren  gel 4 times daily to right ankle and calf.  Discussed bracing with therapies.  Add baclofen  5 mg 3 times daily for developing tightness/muscle aching on the right hemibody.  Consider right hip x-ray for arthritis if groin pain becomes worse.  1/15: Pain much improved with current regimen, monitor  1-17: Occipital nerve distribution of odd sensations across his head, not painful, does not want additional treatments at this time  -1/19 Will order Vas US  r/o DVT but think it is less likely--ultrasound negative  1-20: Increase baclofen  to 10 mg 3 times daily--improved pain tolerance  1-21: Increase nightly gabapentin  to 600 mg for calf and hand pain  1-22: Oversedated with current regimen; dropped gabapentin  back to 300, reduce baclofen  to 10 mg 3 times daily, add Robaxin  500 mg every 8 hours as needed for spasms  4. Mood/Behavior/Sleep: Provide emotional support             -antipsychotic agents: N/A   - sleeping well, apporpriate  5.  Neuropsych/cognition: This patient is capable of making decisions on his own behalf.  6. Skin/Wound Care: Routine skin checks  - 1-14:   will reach out to Dr. [name] regarding DC staples POD 10 tomorrow--OK, order placed 1/15  1-16: Some uneven edges on the surgical line in the lower portion, reinforced with Steri-Strips today.  No drainage or open wound bed, no concern for dehiscence or infection--looking much better   7. Fluids/Electrolytes/Nutrition: Routine and analysis with follow-up chemistries   - labs stable on admission 8.  Diabetes mellitus.  Hemoglobin A1c 7.2.  Glucophage  500 mg twice daily, Lantus  insulin  8 units nightly Recent Labs    04/29/24 2007 04/30/24 0548 04/30/24 1200  GLUCAP 138* 176* 124*    - 1-18: Blood sugars well-controlled, have not resumed home Lantus .  Continue as needed insulin , using rarely, may be able to reduce blood glucose checks this week -1/19 well-controlled continue current regimen and monitor   9.  Hyperlipidemia.  Crestor  10.  Hypertension.  Norvasc  5 mg daily, Tenormin  50 mg daily, monitor with increased mobility -Blood pressure, vital stable.    04/30/2024   12:36 PM 04/30/2024    9:35 AM 04/30/2024    4:32 AM  Vitals with BMI  Systolic 123 159 877  Diastolic 67 56 73  Pulse 63 68 70    11.  Class II obesity.  BMI 35.29.  Dietary follow-up 12.  Constipation/IBS.  MiraLAX  daily, Senokot 1 tablet nightly   - 1/12 LBM--may need laxatives increase tomorrow if no improvement  Medium bowel movement 1-21  13.  COPD.  History of tobacco use.  Monitor oxygen saturations.  Discussed no nicotine.  1-20: Patient to bring in portable CPAP machine from home; nursing informed to assess and set up--pending  14. Urinary urgency. Wean off purwick.    - no incontinence  15. Left vision deficits.  Continue glasses taping, therapies.  - 1-17: Patient endorses double vision has resolved today.  16.  Gastric reflux.  Resume Pepcid  40 mg daily, Tums 3  times daily as needed   -Symptoms well-controlled on current regimen LOS: 10 days A FACE TO FACE EVALUATION WAS PERFORMED  Joesph JAYSON Likes 04/30/2024, 4:04 PM     "

## 2024-05-01 LAB — GLUCOSE, CAPILLARY
Glucose-Capillary: 109 mg/dL — ABNORMAL HIGH (ref 70–99)
Glucose-Capillary: 132 mg/dL — ABNORMAL HIGH (ref 70–99)
Glucose-Capillary: 99 mg/dL (ref 70–99)
Glucose-Capillary: 87 mg/dL (ref 70–99)

## 2024-05-01 NOTE — Progress Notes (Signed)
 Speech Language Pathology Weekly Progress and Session Note  Patient Details  Name: Larry Dawson MRN: [id number] Date of Birth: [birth year]  Beginning of progress report period: April 21, 2024 End of progress report period: May 01, 2024  Today's Date: 05/01/2024 SLP Individual Time: 8494-8464 SLP Individual Time Calculation (min): 30 min  Short Term Goals: Week 1: SLP Short Term Goal 1 (Week 1): Patient will selectively attend to therapy tasks for 20 minutes given supervision assist. SLP Short Term Goal 1 - Progress (Week 1): Not met SLP Short Term Goal 2 (Week 1): Patient will solve complex functional problems with supervision assist. SLP Short Term Goal 2 - Progress (Week 1): Met SLP Short Term Goal 3 (Week 1): Patient will utilize SLOP speech intelligibility strategies at the conversation level with supervision assist. SLP Short Term Goal 3 - Progress (Week 1): Met SLP Short Term Goal 4 (Week 1): Patient will tolerate regular/thin liquid diet with supervision assist for use of safe swallowing strategies. SLP Short Term Goal 4 - Progress (Week 1): Met    New Short Term Goals: Week 2: SLP Short Term Goal 1 (Week 2): Patient will selectively attend to therapy tasks for 10 minutes given supervision assist. SLP Short Term Goal 2 (Week 2): Patient will solve complex functional problems @ modI SLP Short Term Goal 3 (Week 2): Patient will utilize SLOP speech intelligibility strategies at the conversation level @ modI  Weekly Progress Updates: Excellent progress noted this week, as evidenced by mastery of 3/4 goals. Pt verbosity negatively impacts his attention, however, anticipate this is re to pt personality vs true deficits. He continues to tolerate a regular diet w/ thin liquids w/o difficulty and maintains speech intelligibility @ modI. Pt/family education ongoing. He would benefit from continued ST to target remaining cognitive-linguistic deficits and ensure diet tolerance,  maximize pt independence, and facilitate return to prev roles/responsibilities.    Intensity: Minumum of 1-2 x/day, 30 to 90 minutes Frequency: 1 to 3 out of 7 days Duration/Length of Stay: 2/3 Treatment/Interventions: Cognitive remediation/compensation;Speech/Language facilitation;Cueing hierarchy;Functional tasks;Therapeutic Activities;Internal/external aids;Patient/family education   Daily Session  Skilled Therapeutic Interventions:  Pt and his wife greeted at bedside. He was up in his WC, very agreeable to tx tasks. During initial conversation, he was able to recall recent info independently. He then completed a money management task via menu. He benefited from only supervisionA for attention to detail and problem solving. He also recalled compensatory speech strategies introduced in prev tx sessions and was able to utilize these in conversation @ modI to maintain 100% intelligibility. Rapid rate and self corrections resulted in irregular prosody at times, but remained intelligible throughout. He also reported that deficits w/ mastication persist d/t jaw alignment, however, dysphagia has resolved otherwise. Will continue to ensure diet tolerance per pt report and/or dynamic assessment in upcoming tx sessions. At the end of tx tasks, he was left in his [facility] w/ the call light within reach and wife present. Recommend cont ST per POC.    Pain  None reported  Therapy/Group: Individual Therapy  Larry Dawson 05/01/2024, 9:56 PM

## 2024-05-01 NOTE — Progress Notes (Signed)
 Occupational Therapy Session Note  Patient Details  Name: Larry Dawson MRN: [id number] Date of Birth: 1956/02/21  Today's Date: 05/01/2024 OT Individual Time: 1310-1345 OT Individual Time Calculation (min): 35 min    Short Term Goals: Week 2:  OT Short Term Goal 1 (Week 2): Pt will thread LB garments with Min A. OT Short Term Goal 2 (Week 2): Pt will perform UB care with Min A + Min cues for modification techniques integration. OT Short Term Goal 3 (Week 2): Pt will perform toilet transfer with Mod A (x1).  Skilled Therapeutic Interventions/Progress Updates:  Pt greeted sitting in WC, no reports of pain. Dependent for WC transport from room<>ortho gym for time/energy management. At table top, pt instructed in multidirectional reaching task with use of RUE. Gross grip targeted with mild resistance present as squiggz placed/stuck on mat. Activity upgraded by increasing functional reaching distance further challenging shoulder flexion/abduction. Pt then engaged in modified corn hole activity. At seated level, patient reaches below waist level to retrieve bean bags, tossing them towards a target for further RUE NMR/coordination. Min cues required for attention to grip on bean bag before tossing to assure secure hold. Pt remained sitting in WC with all immediate needs met, call bell within reach, and door open.   Therapy Documentation Precautions:  Precautions Precautions: Fall Recall of Precautions/Restrictions: Impaired Precaution/Restrictions Comments: R-hemiparesis/hemiplegia Restrictions Weight Bearing Restrictions Per Provider Order: No   Therapy/Group: Individual Therapy  Nereida Habermann, OTR/L, MSOT  05/01/2024, 1:18 PM

## 2024-05-01 NOTE — Progress Notes (Signed)
 "                                                        PROGRESS NOTE   Subjective/Complaints:  Vitals are stable.   Patient with some ongoing pain in his right calf, consistent, but overall participatory in therapies No longer feeling drunk or fatigued after medication change.  Spoke with patient's wife on phone, she is concerned about discharge date given patient is not yet ambulatory, discussed that he is anticipated to return home at a partial assist level and that recovery will take some time but DC date estimates are based on patient progress and medical complexity, and can change as needs arise.  Also asks about patient being more lethargic yesterday, discussed likely side effect to gabapentin  and now back on better tolerated dose.    ROS: Patient denies fever, new vision changes, dizziness, nausea, vomiting, diarrhea,  shortness of breath or chest pain, headache, or mood change. + Right calf pain  Objective:   No results found.  Recent Labs    04/30/24 1049  WBC 7.5  HGB 14.9  HCT 45.1  PLT 296    Recent Labs    04/30/24 1049  NA 140  K 4.3  CL 102  CO2 28  GLUCOSE 156*  BUN 11  CREATININE 1.17  CALCIUM  9.6     Intake/Output Summary (Last 24 hours) at 05/01/2024 1605 Last data filed at 05/01/2024 0700 Gross per 24 hour  Intake 430 ml  Output 400 ml  Net 30 ml        Physical Exam: Vital Signs Blood pressure 114/64, pulse (!) 53, temperature 97.9 F (36.6 C), resp. rate 18, height 5' 7 (1.702 m), weight 95.7 kg, SpO2 100%.  Physical Exam  Constitutional: No apparent distress. Appropriate appearance for age.  Sitting in bedside chair. HENT: Status post left basal craniotomy, stable Eyes: PERRLA.  EOMI.  No obvious nystagmus. Cardiovascular: RRR, no murmurs/rub/gallops. No Edema. Peripheral pulses 2+  Respiratory: CTAB. No rales, rhonchi, or wheezing. On RA.  Abdomen: + bowel sounds, normoactive. No distention or tenderness.  Skin: Posterior  surgical site appears to be healing well --removed most of the Steri-Strips 1-23, site well-approximated and healing   MSK:      No apparent deformity. + Right ankle AFO--well-fitting      Mild to moderate right leg tenderness on Palpation--unchanged 1-23  Neurologic exam:  Cognition: AAO to person, place, time and event.  + Slight cognitive delay Language: Moderate dysarthria, with some speech delay attributable to this Insight: Fair insight into current condition.  Mood: Pleasant affect, appropriate mood.  Sensation: Reduced to light touch throughout the right hemibody Reflexes: 2+ in BL UE and LEs. Negative Hoffman's and babinski signs bilaterally.  CN:  +facial droop.   Coordination: Right upper and lower extremity ataxia, complicated by weakness Spasticity: MAS 1 right wrist flexors, finger flexors, elbow flexors, plantar flexors       Strength:                RUE: 3/5 SA, 3/5 EF, 3/5 EE, 4/5 WE, 4+/5 FF (5-/5 in 4-5th digit), 4/5 FA                LUE:  5/5 SA, 5/5 EF, 5/5 EE, 5/5 WE, 5/5 FF, 5/5 FA  RLE: 3/5 HF, 3/5 KE, 0/5  DF, 0/5  EHL, 4/5  PF --in AFO                LLE:  5/5 HF, 5/5 KE, 5/5  DF, 5/5  EHL, 5/5  PF  Physical exam unchanged from the above on reexamination 05/01/24    Assessment/Plan: 1. Functional deficits which require 3+ hours per day of interdisciplinary therapy in a comprehensive inpatient rehab setting. Physiatrist is providing close team supervision and 24 hour management of active medical problems listed below. Physiatrist and rehab team continue to assess barriers to discharge/monitor patient progress toward functional and medical goals  Care Tool:  Bathing    Body parts bathed by patient: Right arm, Chest, Abdomen, Front perineal area, Right upper leg, Left upper leg, Face   Body parts bathed by helper: Left arm, Right lower leg, Left lower leg, Buttocks     Bathing assist Assist Level: Moderate Assistance - Patient 50 - 74%      Upper Body Dressing/Undressing Upper body dressing   What is the patient wearing?: Pull over shirt    Upper body assist Assist Level: Minimal Assistance - Patient > 75%    Lower Body Dressing/Undressing Lower body dressing      What is the patient wearing?: Pants     Lower body assist Assist for lower body dressing: 2 Helpers     Toileting Toileting    Toileting assist Assist for toileting: 2 Helpers     Transfers Chair/bed transfer  Transfers assist     Chair/bed transfer assist level: Moderate Assistance - Patient 50 - 74%     Locomotion Ambulation   Ambulation assist      Assist level: 2 helpers Assistive device: Other (comment) (hand rail) Max distance: 30   Walk 10 feet activity   Assist     Assist level: 2 helpers Assistive device: Other (comment) (hand rail)   Walk 50 feet activity   Assist Walk 50 feet with 2 turns activity did not occur: Safety/medical concerns         Walk 150 feet activity   Assist Walk 150 feet activity did not occur: Safety/medical concerns         Walk 10 feet on uneven surface  activity   Assist Walk 10 feet on uneven surfaces activity did not occur: Safety/medical concerns         Wheelchair     Assist Is the patient using a wheelchair?: Yes Type of Wheelchair: Manual    Wheelchair assist level: Maximal Assistance - Patient 25 - 49% Max wheelchair distance: 43'    Wheelchair 50 feet with 2 turns activity    Assist        Assist Level: Maximal Assistance - Patient 25 - 49%   Wheelchair 150 feet activity     Assist      Assist Level: Total Assistance - Patient < 25%   Blood pressure 114/64, pulse (!) 53, temperature 97.9 F (36.6 C), resp. rate 18, height 5' 7 (1.702 m), weight 95.7 kg, SpO2 100%.    Medical Problem List and Plan: 1. 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 Janjua              -patient may  shower with shower cap             -ELOS/Goals: 14-16d -CGA to Min A goals -DC date 05/12/24  - Stable to continue  inpatient rehab   - 1/13: Going home with 2 daughters, one is CNA. Min to Mod A UB and Max A LB ADLs; poor body orientation. Other evals pending.    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  He is understanding   - 1/20: Min A transfers and walker 130 ft yesterday; Mod- Max A today handheld assist due to fatigue. Min-Mod A UB care, Mod A LB care due to poor midline orientation and balance. Mild dysarthria and aprosity. Some attention/reasoning deficits c/b personality.  - 1-23: Discussed discharge date with patient, wife.  They have considerable concerns that he is not yet ambulatory, did discuss that he is expected to need some assistance at discharge and independent ambulation is not expected.  Answered all questions.  - Likely custom AFO needed closer to discharge    2.  Antithrombotics: -DVT/anticoagulation:  Pharmaceutical: Heparin  initiated 04/14/2024             -antiplatelet therapy: N/A  1-21: Duplex right lower extremity negative  3. Pain Management: Tylenol  as needed   -1-13 discussed positioning off of surgical site at nighttime.  Pain well-controlled on current regimen.  - 1-14: Voltaren  gel 4 times daily to right ankle and calf.  Discussed bracing with therapies.  Add baclofen  5 mg 3 times daily for developing tightness/muscle aching on the right hemibody.  Consider right hip x-ray for arthritis if groin pain becomes worse.  1/15: Pain much improved with current regimen, monitor  1-17: Occipital nerve distribution of odd sensations across his head, not painful, does not want additional  treatments at this time  -1/19 Will order Vas US  r/o DVT but think it is less likely--ultrasound negative  1-20: Increase baclofen  to 10 mg 3 times daily--improved pain tolerance  1-21: Increase nightly gabapentin  to 600 mg for calf and hand pain  1-22: Oversedated with current regimen; dropped gabapentin  back to 300, reduce baclofen  to 5 mg 3 times daily, add Robaxin  500 mg every 8 hours as needed for spasms--sedation improved 1-23  4. Mood/Behavior/Sleep: Provide emotional support             -antipsychotic agents: N/A   - sleeping well, apporpriate  5. Neuropsych/cognition: This patient is capable of making decisions on his own behalf.  6. Skin/Wound Care: Routine skin checks  - 1-14:   will reach out to Dr. [name] regarding DC staples POD 10 tomorrow--OK, order placed 1/15  1-16: Some uneven edges on the surgical line in the lower portion, reinforced with Steri-Strips today.  No drainage or open wound bed, no concern for dehiscence or infection--looking much better --removed, Steri-Strips 1-23  7. Fluids/Electrolytes/Nutrition: Routine and analysis with follow-up chemistries   - labs stable on admission 8.  Diabetes mellitus.  Hemoglobin A1c 7.2.  Glucophage  500 mg twice daily, Lantus  insulin  8 units nightly Recent Labs    04/30/24 2028 05/01/24 0632 05/01/24 1121  GLUCAP 105* 109* 132*    - 1-18: Blood sugars well-controlled, have not resumed home Lantus .  Continue as needed insulin , using rarely, may be able to reduce blood glucose checks this week -1/19 well-controlled continue current regimen and monitor 1-23: Tolerating metformin , rare use of insulin  staying below 2 units.  Will monitor through the weekend  9.  Hyperlipidemia.  Crestor  10.  Hypertension.  Norvasc  5 mg daily, Tenormin  50 mg daily, monitor with increased mobility -Blood pressure, vital stable.    05/01/2024    6:29 AM 04/30/2024    7:59 PM 04/30/2024   12:36 PM  Vitals with BMI  Systolic 114 128 876   Diastolic 64 73 67  Pulse 53 66 63    11.  Class II obesity.  BMI 35.29.  Dietary follow-up 12.  Constipation/IBS.  MiraLAX  daily, Senokot 1 tablet nightly   - 1/12 LBM--may need laxatives increase tomorrow if no improvement  Medium bowel movement 1-21  13.  COPD.  History of tobacco use.  Monitor oxygen saturations.  Discussed no nicotine.  1-20: Patient to bring in portable CPAP machine from home; nursing informed to assess and set up--pending  14. Urinary urgency. Wean off purwick.    - no incontinence  15. Left vision deficits.  Continue glasses taping, therapies.  - 1-17: Patient endorses double vision has resolved today.  16.  Gastric reflux.  Resume Pepcid  40 mg daily, Tums 3 times daily as needed   -Symptoms well-controlled on current regimen  17.  Stage IIIa CKD.  Baseline creatinine appears 1.1-1.4  - 1-22: Some fluctuations, but at baseline.  LOS: 11 days A FACE TO FACE EVALUATION WAS PERFORMED  Joesph JAYSON Likes 05/01/2024, 4:05 PM     "

## 2024-05-01 NOTE — Progress Notes (Signed)
 Physical Therapy Session Note  Patient Details  Name: Larry Dawson MRN: [id number] Date of Birth: 07-08-1955  Today's Date: 05/01/2024 PT Individual Time: 0805-0900 PT Individual Time Calculation (min): 55 min   Short Term Goals: Week 2:  PT Short Term Goal 1 (Week 2): Pt will complete transfers consistently with minA and LRAD PT Short Term Goal 2 (Week 2): Pt will complete gait 150' with min assist and LRAD PT Short Term Goal 3 (Week 2): Pt will complete up/down 4 steps with mod assist  Skilled Therapeutic Interventions/Progress Updates:    Pt presents in room in bed, agreeable to PT. Pt denies pain at rest but does report discomfort in R calf when standing. Session focused on therapeutic activities to facilitate improved participation and safety with self care tasks as well as gait training via BWS to facilitate high intensity gait training. Pt completes bed mobility with supervision with increased time. Pt requires set up and supervision for upper body dressing with cues for sequencing. Pt requires mod assist for donning underwear and pants. Pt stands to complete periarea hygiene in standing with LUE and RUE support on RW, thearpist providing mod assist for standing balance, able to manage pulling pants over hips. Pt completes stand step transfer with mod assist to [facility] with cues provided for sequencing stepping with RLE. Pt transported to day room and set up on litegait for gait training via BWS over treadmill. Pt completes gait with mod assist for weightshifting to left and 2nd person assist for advancing RLE, completes x2 trials indicated below: - trial 1: 3 minutes at 0.5 mph, ambulating 0.02 mi - trial 2: 6 minutes at 0.6 mph, ambulating total 0.05 mi Total distance ambulated: 308' Pt requires mod/max verbal cues for weightshifting, attempt to ambulate without LUE support to decrease pushing behavior however pt unable to maintain, education provide on amount needed to weightshift during  transfer with pt verbalizing understanidng. Pt returns to room and remains seated in [facility] with all needs within reach, cal light in place and chair alarm donned and activated at end of session.   Therapy Documentation Precautions:  Precautions Precautions: Fall Recall of Precautions/Restrictions: Impaired Precaution/Restrictions Comments: R-hemiparesis/hemiplegia Restrictions Weight Bearing Restrictions Per Provider Order: No   Therapy/Group: Individual Therapy  Reche Ohara PT, DPT 05/01/2024, 9:05 AM

## 2024-05-01 NOTE — Plan of Care (Signed)
" °  Problem: Consults Goal: RH STROKE PATIENT EDUCATION Description: See Patient Education module for education specifics  Outcome: Progressing   Problem: RH BOWEL ELIMINATION Goal: RH STG MANAGE BOWEL WITH ASSISTANCE Description: STG Manage Bowel with supervision Assistance. Outcome: Progressing   Problem: RH BLADDER ELIMINATION Goal: RH STG MANAGE BLADDER WITH ASSISTANCE Description: STG Manage Bladder With supervision  Assistance Outcome: Progressing   Problem: RH SKIN INTEGRITY Goal: RH STG SKIN FREE OF INFECTION/BREAKDOWN Description: Skin free of infection with supervision assistance Outcome: Progressing   Problem: RH SAFETY Goal: RH STG ADHERE TO SAFETY PRECAUTIONS W/ASSISTANCE/DEVICE Description: STG Adhere to Safety Precautions With supervision Assistance/Device. Outcome: Progressing   Problem: RH PAIN MANAGEMENT Goal: RH STG PAIN MANAGED AT OR BELOW PT'S PAIN GOAL Description: <4 w/ prn Outcome: Progressing   Problem: RH KNOWLEDGE DEFICIT Goal: RH STG INCREASE KNOWLEDGE OF DIABETES Description: Manage increase knowledge of diabetes with supervision assistance from wife using educational materials provided  Outcome: Progressing Goal: RH STG INCREASE KNOWLEDGE OF HYPERTENSION Description: Manage increase knowledge of hypertension with supervision assistance from wife using educational materials provided  Outcome: Progressing   "

## 2024-05-01 NOTE — Progress Notes (Signed)
 Occupational Therapy Session Note  Patient Details  Name: Larry Dawson MRN: [id number] Date of Birth: [birth year]  Today's Date: 05/01/2024 OT Individual Time: 9049-8954 OT Individual Time Calculation (min): 55 min   Short Term Goals: Week 2:  OT Short Term Goal 1 (Week 2): Pt will thread LB garments with Min A. OT Short Term Goal 2 (Week 2): Pt will perform UB care with Min A + Min cues for modification techniques integration. OT Short Term Goal 3 (Week 2): Pt will perform toilet transfer with Mod A (x1).  Skilled Therapeutic Interventions/Progress Updates:  Pt greeted in [facility] for skilled OT session with focus on dynamic standing balance and NMR of the RUE.   Pain: Pt did not report experiencing pain. OT offering intermediate rest breaks and positioning suggestions throughout session to address pain/fatigue and maximize participation/safety in session.   Functional Transfers: Pt required Min-A for series of sit<>stands throughout session. Pt still continues to require cues for placement of R hemibody to improve mechanics of transfer.  Therapeutic Exercise:  In standing pt completes table glides targeting shoulder flexion, ab/adduction, and clockwise/counterclockwise circumduction. 1x 12-15 reps performed with visual targets to improve motor planning. Pt stands with Min-A to block R knee to prevent lateral lean with fatigue. Pt able to stand for an ~3-4 min without experiencing a LOB.  Pt completed putty/foam block exercises to improve R hand strength and coordination. Pt unable to tolerate yellow theraputty due to difficulties with positioning and manipulation. Therefore, activity downgraded to use of foam block. Pt instructed in thumb flexion, digit adduction, and 3-point pinch exercises. OTS provides visual and verbal cuing for effective execution of exercises.   Pt remained in [facility] with 4Ps assessed and immediate needs met. Pt continues to be appropriate for skilled OT intervention to  promote further functional independence in ADLs/IADLs.   Therapy Documentation Precautions:  Precautions Precautions: Fall Recall of Precautions/Restrictions: Impaired Precaution/Restrictions Comments: R-hemiparesis/hemiplegia Restrictions Weight Bearing Restrictions Per Provider Order: No  Therapy/Group: Individual Therapy  Misty Pacini, OTS 05/01/2024, 7:42 AM

## 2024-05-02 DIAGNOSIS — K5901 Slow transit constipation: Secondary | ICD-10-CM

## 2024-05-02 LAB — GLUCOSE, CAPILLARY
Glucose-Capillary: 107 mg/dL — ABNORMAL HIGH (ref 70–99)
Glucose-Capillary: 143 mg/dL — ABNORMAL HIGH (ref 70–99)
Glucose-Capillary: 99 mg/dL (ref 70–99)
Glucose-Capillary: 132 mg/dL — ABNORMAL HIGH (ref 70–99)

## 2024-05-02 NOTE — Progress Notes (Signed)
 "                                                        PROGRESS NOTE   Subjective/Complaints:  Pt doing well, slept well, denies pain currently. LBM yesterday but then also after eval. Urinating fine. No other complaints or concerns.     ROS: as per HPI. Denies CP, SOB, abd pain, N/V/D/C, or any other complaints at this time.   + Right calf pain  Objective:   No results found.  Recent Labs    04/30/24 1049  WBC 7.5  HGB 14.9  HCT 45.1  PLT 296    Recent Labs    04/30/24 1049  NA 140  K 4.3  CL 102  CO2 28  GLUCOSE 156*  BUN 11  CREATININE 1.17  CALCIUM  9.6     Intake/Output Summary (Last 24 hours) at 05/02/2024 1220 Last data filed at 05/02/2024 0900 Gross per 24 hour  Intake 218 ml  Output 200 ml  Net 18 ml        Physical Exam: Vital Signs Blood pressure (!) 124/59, pulse (!) 57, temperature 97.6 F (36.4 C), temperature source Oral, resp. rate 18, height 5' 7 (1.702 m), weight 95.7 kg, SpO2 100%.  Physical Exam  Constitutional: No apparent distress. Appropriate appearance for age.  Resting comfortably in bed.  HENT: Status post left basal craniotomy, stable Eyes: PERRLA.  EOMI.  No obvious nystagmus. Cardiovascular: RRR, no murmurs/rub/gallops. No Edema. Peripheral pulses 2+  Respiratory: CTAB. No rales, rhonchi, or wheezing. On RA.  Abdomen: + bowel sounds, normoactive. No distention or tenderness.  Skin: Posterior surgical site appears to be healing well --removed most of the Steri-Strips 1-23, site well-approximated and healing   PRIOR EXAMS: MSK:      No apparent deformity. + Right ankle AFO--well-fitting      Mild to moderate right leg tenderness on Palpation--unchanged 1-23  Neurologic exam:  Cognition: AAO to person, place, time and event.  + Slight cognitive delay Language: Moderate dysarthria, with some speech delay attributable to this Insight: Fair insight into current condition.  Mood: Pleasant affect, appropriate mood.   Sensation: Reduced to light touch throughout the right hemibody Reflexes: 2+ in BL UE and LEs. Negative Hoffman's and babinski signs bilaterally.  CN:  +facial droop.   Coordination: Right upper and lower extremity ataxia, complicated by weakness Spasticity: MAS 1 right wrist flexors, finger flexors, elbow flexors, plantar flexors       Strength:                RUE: 3/5 SA, 3/5 EF, 3/5 EE, 4/5 WE, 4+/5 FF (5-/5 in 4-5th digit), 4/5 FA                LUE:  5/5 SA, 5/5 EF, 5/5 EE, 5/5 WE, 5/5 FF, 5/5 FA                RLE: 3/5 HF, 3/5 KE, 0/5  DF, 0/5  EHL, 4/5  PF --in AFO                LLE:  5/5 HF, 5/5 KE, 5/5  DF, 5/5  EHL, 5/5  PF    Assessment/Plan: 1. Functional deficits which require 3+ hours per day of interdisciplinary therapy in a comprehensive inpatient rehab  setting. Physiatrist is providing close team supervision and 24 hour management of active medical problems listed below. Physiatrist and rehab team continue to assess barriers to discharge/monitor patient progress toward functional and medical goals  Care Tool:  Bathing    Body parts bathed by patient: Right arm, Chest, Abdomen, Front perineal area, Right upper leg, Left upper leg, Face   Body parts bathed by helper: Left arm, Right lower leg, Left lower leg, Buttocks     Bathing assist Assist Level: Moderate Assistance - Patient 50 - 74%     Upper Body Dressing/Undressing Upper body dressing   What is the patient wearing?: Pull over shirt    Upper body assist Assist Level: Minimal Assistance - Patient > 75%    Lower Body Dressing/Undressing Lower body dressing      What is the patient wearing?: Pants     Lower body assist Assist for lower body dressing: 2 Helpers     Toileting Toileting    Toileting assist Assist for toileting: 2 Helpers     Transfers Chair/bed transfer  Transfers assist     Chair/bed transfer assist level: Moderate Assistance - Patient 50 - 74%      Locomotion Ambulation   Ambulation assist      Assist level: 2 helpers Assistive device: Other (comment) (hand rail) Max distance: 30   Walk 10 feet activity   Assist     Assist level: 2 helpers Assistive device: Other (comment) (hand rail)   Walk 50 feet activity   Assist Walk 50 feet with 2 turns activity did not occur: Safety/medical concerns         Walk 150 feet activity   Assist Walk 150 feet activity did not occur: Safety/medical concerns         Walk 10 feet on uneven surface  activity   Assist Walk 10 feet on uneven surfaces activity did not occur: Safety/medical concerns         Wheelchair     Assist Is the patient using a wheelchair?: Yes Type of Wheelchair: Manual    Wheelchair assist level: Maximal Assistance - Patient 25 - 49% Max wheelchair distance: 62'    Wheelchair 50 feet with 2 turns activity    Assist        Assist Level: Maximal Assistance - Patient 25 - 49%   Wheelchair 150 feet activity     Assist      Assist Level: Total Assistance - Patient < 25%   Blood pressure (!) 124/59, pulse (!) 57, temperature 97.6 F (36.4 C), temperature source Oral, resp. rate 18, height 5' 7 (1.702 m), weight 95.7 kg, SpO2 100%.    Medical Problem List and Plan: 1. 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 Janjua             -patient may  shower with shower cap             -ELOS/Goals: 14-16d -CGA to Min A goals -DC date 05/12/24  - Stable to continue inpatient rehab  - 1/13: Going home with 2 daughters, one is CNA. Min to Mod A UB and Max A LB ADLs; poor body orientation. Other evals pending.   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  He is understanding   - 1/20: Min A transfers and walker 130 ft yesterday; Mod- Max A today handheld assist due to fatigue. Min-Mod A UB care, Mod A LB care due to poor midline orientation and balance. Mild dysarthria and aprosity. Some attention/reasoning deficits c/b personality.  - 1-23: Discussed discharge date with patient, wife.  They have considerable concerns that he is not yet ambulatory, did discuss that he is expected to need some assistance at discharge and independent ambulation is not expected.  Answered all questions.  - Likely custom AFO needed closer to discharge    2.  Antithrombotics: -DVT/anticoagulation:  Pharmaceutical: Heparin  5kU BID initiated 04/14/2024             -antiplatelet therapy: N/A  1-21: Duplex right lower extremity negative  3. Pain Management: Tylenol  as needed  -1-13 discussed positioning off of surgical site at nighttime.  Pain well-controlled on current regimen. - 1-14: Voltaren  gel 4 times daily to right ankle and calf.  Discussed bracing with therapies.  Add baclofen  5 mg 3 times daily for developing tightness/muscle aching on the right hemibody.  Consider right hip x-ray for arthritis if groin pain becomes worse.  1/15: Pain much improved with current regimen, monitor 1-17: Occipital nerve distribution of odd sensations across his head, not painful, does not want additional treatments at this time  -1/19 Will order Vas US  r/o DVT but think it is less likely--ultrasound negative  1-20: Increase baclofen  to 10 mg 3 times daily--improved pain tolerance  1-21: Increase nightly gabapentin  to 600 mg for calf and hand pain 1-22: Oversedated with current regimen; dropped gabapentin  back to 300 nightly, reduce baclofen  to 5 mg 3 times daily, add Robaxin  500 mg every 8 hours as needed for spasms--sedation improved 1-23  4. Mood/Behavior/Sleep: Provide emotional support             -antipsychotic agents:  N/A   - sleeping well, apporpriate  5. Neuropsych/cognition: This patient is capable of making decisions on his own behalf.  6. Skin/Wound Care: Routine skin checks - 1-14:   will reach out to Dr. [name] regarding DC staples POD 10 tomorrow--OK, order placed 1/15 1-16: Some uneven edges on the surgical line in the lower portion, reinforced with Steri-Strips today.  No drainage or open wound bed, no concern for dehiscence or infection--looking much better --removed, Steri-Strips 1-23  7. Fluids/Electrolytes/Nutrition: Routine and analysis with follow-up chemistries, continue vitamins/supplements   - labs stable on admission  8.  Diabetes mellitus.  Hemoglobin A1c 7.2.  Glucophage  500 mg twice daily, Lantus  insulin  8 units nightly - 1-18: Blood sugars well-controlled, have not resumed home Lantus .  Continue as needed insulin , using rarely, may be able to reduce blood glucose checks this week -1/19 well-controlled continue current regimen and monitor -1-23: Tolerating metformin , rare use of insulin  staying below 2 units.  Will monitor through the weekend  -05/02/24 CBGs looking great, would consider cutting back to BID checks if trend continues  Recent Labs    05/01/24 2027 05/02/24 0608 05/02/24 1107  GLUCAP 87 107* 143*     9.  Hyperlipidemia.  Crestor  20mg  nightly  10.  Hypertension.  Norvasc  5 mg daily, Atenolol  50 mg daily, monitor with increased mobility -Blood pressure, vital stable.  Vitals:   04/28/24 1315 04/28/24 1923 04/29/24 0518 04/29/24 1251  BP: 124/70 113/72 107/65 119/69   04/29/24 2006 04/30/24 0432 04/30/24 0935 04/30/24 1236  BP:  119/68 122/73 (!) 159/56 123/67   04/30/24 1959 05/01/24 0629 05/01/24 1604 05/01/24 1944  BP: 128/73 114/64 118/68 (!) 124/59    11.  Class II obesity.  BMI 35.29.  Dietary follow-up  12.  Constipation/IBS.  MiraLAX  daily, Senokot 1 tablet nightly, colace 100mg  BID   - 1/12 LBM--may need laxatives increase tomorrow if no  improvement  -05/02/24 LBM this morning, cont regimen  13.  COPD.  History of tobacco use.  Monitor oxygen saturations.  Discussed no nicotine. 1-20: Patient to bring in portable CPAP machine from home; nursing informed to assess and set up--pending  14. Urinary urgency. Wean off purwick.    - no incontinence, doing well  15. Left vision deficits.  Continue glasses taping, therapies.  - 1-17: Patient endorses double vision has resolved today.  16.  Gastric reflux.  Resume Pepcid  40 mg daily, Protonix  40mg  nightly, Tums 3 times daily as needed   -Symptoms well-controlled on current regimen  17.  Stage IIIa CKD.  Baseline creatinine appears 1.1-1.4  - 1-22: Some fluctuations, but at baseline.   LOS: 12 days A FACE TO FACE EVALUATION WAS PERFORMED  [address] 05/02/2024, 12:20 PM     "

## 2024-05-03 LAB — GLUCOSE, CAPILLARY
Glucose-Capillary: 119 mg/dL — ABNORMAL HIGH (ref 70–99)
Glucose-Capillary: 118 mg/dL — ABNORMAL HIGH (ref 70–99)
Glucose-Capillary: 99 mg/dL (ref 70–99)
Glucose-Capillary: 87 mg/dL (ref 70–99)

## 2024-05-03 NOTE — Plan of Care (Signed)
" °  Problem: Consults Goal: RH STROKE PATIENT EDUCATION Description: See Patient Education module for education specifics  Outcome: Progressing   Problem: RH BOWEL ELIMINATION Goal: RH STG MANAGE BOWEL WITH ASSISTANCE Description: STG Manage Bowel with supervision Assistance. Outcome: Progressing   Problem: RH BLADDER ELIMINATION Goal: RH STG MANAGE BLADDER WITH ASSISTANCE Description: STG Manage Bladder With supervision  Assistance Outcome: Progressing   Problem: RH SKIN INTEGRITY Goal: RH STG SKIN FREE OF INFECTION/BREAKDOWN Description: Skin free of infection with supervision assistance Outcome: Progressing   Problem: RH SAFETY Goal: RH STG ADHERE TO SAFETY PRECAUTIONS W/ASSISTANCE/DEVICE Description: STG Adhere to Safety Precautions With supervision Assistance/Device. Outcome: Progressing   Problem: RH PAIN MANAGEMENT Goal: RH STG PAIN MANAGED AT OR BELOW PT'S PAIN GOAL Description: <4 w/ prn Outcome: Progressing   Problem: RH KNOWLEDGE DEFICIT Goal: RH STG INCREASE KNOWLEDGE OF DIABETES Description: Manage increase knowledge of diabetes with supervision assistance from wife using educational materials provided  Outcome: Progressing Goal: RH STG INCREASE KNOWLEDGE OF HYPERTENSION Description: Manage increase knowledge of hypertension with supervision assistance from wife using educational materials provided  Outcome: Progressing   "

## 2024-05-04 LAB — BASIC METABOLIC PANEL WITH GFR
Anion gap: 8 (ref 5–15)
BUN: 13 mg/dL (ref 8–23)
CO2: 30 mmol/L (ref 22–32)
Calcium: 9.4 mg/dL (ref 8.9–10.3)
Chloride: 102 mmol/L (ref 98–111)
Creatinine, Ser: 1.06 mg/dL (ref 0.61–1.24)
GFR, Estimated: >60 mL/min
Glucose, Bld: 115 mg/dL — ABNORMAL HIGH (ref 70–99)
Potassium: 4.1 mmol/L (ref 3.5–5.1)
Sodium: 140 mmol/L (ref 135–145)

## 2024-05-04 LAB — CBC
HCT: 39.5 % (ref 39.0–52.0)
Hemoglobin: 13.4 g/dL (ref 13.0–17.0)
MCH: 28.3 pg (ref 26.0–34.0)
MCHC: 33.9 g/dL (ref 30.0–36.0)
MCV: 83.3 fL (ref 80.0–100.0)
Platelets: 253 10*3/uL (ref 150–400)
RBC: 4.74 MIL/uL (ref 4.22–5.81)
RDW: 13.2 % (ref 11.5–15.5)
WBC: 5.5 10*3/uL (ref 4.0–10.5)
nRBC: 0 % (ref 0.0–0.2)

## 2024-05-04 LAB — GLUCOSE, CAPILLARY: Glucose-Capillary: 111 mg/dL — ABNORMAL HIGH (ref 70–99)

## 2024-05-04 NOTE — Progress Notes (Signed)
 Physical Therapy Session Note  Patient Details  Name: Larry Dawson MRN: [id number] Date of Birth: [birth year]  Today's Date: 05/04/2024 PT Individual Time: 1105-1202 PT Individual Time Calculation (min): 57 min   Short Term Goals: Week 2:  PT Short Term Goal 1 (Week 2): Pt will complete transfers consistently with minA and LRAD PT Short Term Goal 2 (Week 2): Pt will complete gait 150' with min assist and LRAD PT Short Term Goal 3 (Week 2): Pt will complete up/down 4 steps with mod assist  Skilled Therapeutic Interventions/Progress Updates:    SESSION 1: Pt presents in room in [facility], agreeable to PT. Pt reporting soreness in R calf. Session focused on gait training for upright tolerance, gait mechanics, turns, and multidirectional stepping stability as well as NMR for dynamic standing balance, midline orientation, BLE coordination. Therapist dons socks and shoes and R AFO total assist. Pt transported dependently to day room for time management. Pt completes sit to stand with min assist to RW, cues for hand placement. Pt then completes gait 190' with RW, min/light mod assist and +2 WC follow for safety, therapist standing on L side of pt, cues for RLE foot placement, improved midline orientation however does occasionally require assist to weightshift to L. Pt comes to sitting on EOM with RW with mod assist for postural stability. Pt then completes gait training ambulating around 2 cones with min/mod assist and mod verbal cues for R foot positioning, completed to promote improved turning mechanics with gait. Pt then completes forward/backward walking mod assist with 2nd person close by for safety 3x8'. Pt then completes NMR for BLE coordination, dynamic standing balance midline orientation inclduing step taps 6 step x10 LLE, 2x10 RLE. Pt provided with seated rest breaks between all gait trials and exercises to promote energy conservation and quality with tasks. Pt returns to room and remains seated in [facility]  with all needs within reach, cal light in place and chair alarm donned and activated at end of session.   Therapy Documentation Precautions:  Precautions Precautions: Fall Recall of Precautions/Restrictions: Impaired Precaution/Restrictions Comments: R-hemiparesis/hemiplegia Restrictions Weight Bearing Restrictions Per Provider Order: No     Therapy/Group: Individual Therapy  Reche Ohara PT, DPT 05/04/2024, 12:03 PM

## 2024-05-04 NOTE — Progress Notes (Signed)
 Physical Therapy Session Note  Patient Details  Name: Larry Dawson MRN: [id number] Date of Birth: November 02, 1955  Today's Date: 05/04/2024 PT Individual Time: 8481-8454 PT Individual Time Calculation (min): 27 min   Short Term Goals: Week 2:  PT Short Term Goal 1 (Week 2): Pt will complete transfers consistently with minA and LRAD PT Short Term Goal 2 (Week 2): Pt will complete gait 150' with min assist and LRAD PT Short Term Goal 3 (Week 2): Pt will complete up/down 4 steps with mod assist  Skilled Therapeutic Interventions/Progress Updates:    Pt presents in room in [facility] with pt wife present. Pt does not report pain during session, agreeable to PT. Session focused on therapeutic activities to answer questions about DME prior to DC as well as gait training for upright tolerance. Pt wife requesting info on DME acquisition with therapist providing education on insurance payments and ordering equipment as well as answering questions on how to get paperwork for handicap sticker for car. Pt then transported via WC to day room dependenlty for time management. Pt then completes gait training for 15 minutes for a total of 540' with mod to max assist of 2 initially with significant loss of balance requiring 2nd person to correct balance to upright but then progresses to mod/min assist for gait for the rest of trial. Pt then returned to room, requests to use restroom, pt able to ambulate into bathroom and completes transfer with min/mod assist, requires mod assist for standing balance while pt manages pants down with LUE. Pt comes to sitting and educated to pull cord which pt verbalizes understanding, NT notified of pt position at end of sessoin.  Therapy Documentation Precautions:  Precautions Precautions: Fall Recall of Precautions/Restrictions: Impaired Precaution/Restrictions Comments: R-hemiparesis/hemiplegia Restrictions Weight Bearing Restrictions Per Provider Order: No    Therapy/Group:  Individual Therapy  Reche Ohara PT, DPT 05/04/2024, 3:47 PM

## 2024-05-04 NOTE — Progress Notes (Signed)
 Occupational Therapy Weekly Progress Note  Patient Details  Name: Larry Dawson MRN: [id number] Date of Birth: [birth year]  Beginning of progress report period: April 27, 2024 End of progress report period: May 05, 2024  {CHL IP REHAB OT TIME CALCULATIONS:304400400}   Patient has met {number 1-5:22450} of {number 1-5:20334} short term goals.  ***  Patient continues to demonstrate the following deficits: {impairments:3041632} and therefore will continue to benefit from skilled OT intervention to enhance overall performance with {ADL/iADL:3041649}.  Patient {LTG progression:3041653}.  {plan of rjmz:6958345}  OT Short Term Goals {OT DUH:6958300}  Skilled Therapeutic Interventions/Progress Updates:      Therapy Documentation Precautions:  Precautions Precautions: Fall Recall of Precautions/Restrictions: Impaired Precaution/Restrictions Comments: R-hemiparesis/hemiplegia Restrictions Weight Bearing Restrictions Per Provider Order: No   Therapy/Group: Individual Therapy  Nereida Habermann, OTR/L, MSOT  05/04/2024, 8:53 PM

## 2024-05-04 NOTE — Progress Notes (Addendum)
 Speech Language Pathology Daily Session Note  Patient Details  Name: Larry Dawson MRN: [id number] Date of Birth: 1955/10/21  Today's Date: 05/04/2024 SLP Individual Time: 1000-1056 SLP Individual Time Calculation (min): 56 min  Short Term Goals: Week 2: SLP Short Term Goal 1 (Week 2): Patient will selectively attend to therapy tasks for 10 minutes given supervision assist. SLP Short Term Goal 2 (Week 2): Patient will solve complex functional problems @ modI SLP Short Term Goal 3 (Week 2): Patient will utilize SLOP speech intelligibility strategies at the conversation level @ modI  Skilled Therapeutic Interventions: SLP conducted skilled therapy session targeting cognitive and communication goals. Patient utilized SLOP speech intelligibility strategies at the conversation level with supervision-modI. Patient then completed mild-moderately complex money-based math questions with light supervision-modI. Patient discussed relevant recent information with MD and with SLP with 100% acc. With modI, supervision assist provided for selective attention throughout all session tasks and patient reports this is his most significant remaining deficit. Patient is nearing goal level. Patient was left in room with call bell in reach and alarm set. SLP will continue to target goals per plan of care.        Pain  None endorsed  Therapy/Group: Individual Therapy  Larry Dawson, M.A., Larry Dawson  Larry Dawson 05/04/2024, 10:57 AM

## 2024-05-04 NOTE — Progress Notes (Signed)
 "                                                        PROGRESS NOTE   Subjective/Complaints:  No events overnight. No acute complaints.  Patient complains of some intermittent aching, weakness, and coldness in his right upper and lower extremity.  Otherwise, no complaints. Vitals stable      05/04/2024    8:13 AM 05/04/2024    4:29 AM 05/03/2024    8:20 PM  Vitals with BMI  Systolic 114 111 875  Diastolic 69 65 69  Pulse 58 62 66    Recent Labs    05/03/24 1635 05/03/24 2044 05/04/24 0533  GLUCAP 99 87 111*    Labs stable; Cr downtrending from 1/22.  P.o. intakes appropriate   Continent of bladder   Last BM 1/25, medium    ROS: as per HPI. Denies CP, SOB, abd pain, N/V/D/C, or any other complaints at this time.   + Right upper and lower extremity aching  Objective:   No results found.  Recent Labs    05/04/24 0455  WBC 5.5  HGB 13.4  HCT 39.5  PLT 253    Recent Labs    05/04/24 0455  NA 140  K 4.1  CL 102  CO2 30  GLUCOSE 115*  BUN 13  CREATININE 1.06  CALCIUM  9.4     Intake/Output Summary (Last 24 hours) at 05/04/2024 0919 Last data filed at 05/04/2024 0500 Gross per 24 hour  Intake 594 ml  Output 400 ml  Net 194 ml        Physical Exam: Vital Signs Blood pressure 114/69, pulse (!) 58, temperature 97.6 F (36.4 C), temperature source Oral, resp. rate 18, height 5' 7 (1.702 m), weight 95.7 kg, SpO2 99%.  Physical Exam  Constitutional: No apparent distress. Appropriate appearance for age.  Resting comfortably in bed.  HENT: Status post left basal craniotomy, stable Eyes: PERRLA.  EOMI.  No obvious nystagmus. Cardiovascular: RRR, no murmurs/rub/gallops. No Edema. Peripheral pulses 2+  Respiratory: CTAB. No rales, rhonchi, or wheezing. On RA.  Abdomen: + bowel sounds, normoactive. No distention or tenderness.  Skin: Posterior surgical site well-approximated and healing   MSK:      No apparent deformity.       Mild to moderate right  leg tenderness on Palpation--improved 1-26  Neurologic exam:  Cognition: AAO to person, place, time and event.  + Slight cognitive delay Language: Moderate dysarthria, with some speech delay attributable to this Insight: Fair insight into current condition.  Mood: Pleasant affect, appropriate mood.  Sensation: Reduced to light touch throughout the right hemibody Reflexes: 2+ in BL UE and LEs. Negative Hoffman's and babinski signs bilaterally.  CN:  +facial droop.   Coordination: Right upper and lower extremity ataxia, complicated by weakness Spasticity: MAS 1 right wrist flexors, finger flexors, elbow flexors, plantar flexors       Strength:                RUE: 3/5 SA, 3/5 EF, 3/5 EE, 4/5 WE, 4+/5 FF (5-/5 in 4-5th digit), 4/5 FA                LUE:  5/5 SA, 5/5 EF, 5/5 EE, 5/5 WE, 5/5 FF, 5/5 FA  RLE: 3/5 HF, 3/5 KE, 0/5  DF, 0/5  EHL, 4/5  PF                LLE:  5/5 HF, 5/5 KE, 5/5  DF, 5/5  EHL, 5/5  PF   Unchanged 1-26  Assessment/Plan: 1. Functional deficits which require 3+ hours per day of interdisciplinary therapy in a comprehensive inpatient rehab setting. Physiatrist is providing close team supervision and 24 hour management of active medical problems listed below. Physiatrist and rehab team continue to assess barriers to discharge/monitor patient progress toward functional and medical goals  Care Tool:  Bathing    Body parts bathed by patient: Right arm, Chest, Abdomen, Front perineal area, Right upper leg, Left upper leg, Face   Body parts bathed by helper: Left arm, Right lower leg, Left lower leg, Buttocks     Bathing assist Assist Level: Moderate Assistance - Patient 50 - 74%     Upper Body Dressing/Undressing Upper body dressing   What is the patient wearing?: Pull over shirt    Upper body assist Assist Level: Minimal Assistance - Patient > 75%    Lower Body Dressing/Undressing Lower body dressing      What is the patient wearing?:  Pants     Lower body assist Assist for lower body dressing: 2 Helpers     Toileting Toileting    Toileting assist Assist for toileting: 2 Helpers     Transfers Chair/bed transfer  Transfers assist     Chair/bed transfer assist level: Moderate Assistance - Patient 50 - 74%     Locomotion Ambulation   Ambulation assist      Assist level: 2 helpers Assistive device: Other (comment) (hand rail) Max distance: 30   Walk 10 feet activity   Assist     Assist level: 2 helpers Assistive device: Other (comment) (hand rail)   Walk 50 feet activity   Assist Walk 50 feet with 2 turns activity did not occur: Safety/medical concerns         Walk 150 feet activity   Assist Walk 150 feet activity did not occur: Safety/medical concerns         Walk 10 feet on uneven surface  activity   Assist Walk 10 feet on uneven surfaces activity did not occur: Safety/medical concerns         Wheelchair     Assist Is the patient using a wheelchair?: Yes Type of Wheelchair: Manual    Wheelchair assist level: Maximal Assistance - Patient 25 - 49% Max wheelchair distance: 34'    Wheelchair 50 feet with 2 turns activity    Assist        Assist Level: Maximal Assistance - Patient 25 - 49%   Wheelchair 150 feet activity     Assist      Assist Level: Total Assistance - Patient < 25%   Blood pressure 114/69, pulse (!) 58, temperature 97.6 F (36.4 C), temperature source Oral, resp. rate 18, height 5' 7 (1.702 m), weight 95.7 kg, SpO2 99%.    Medical Problem List and Plan: 1. 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 Janjua             -patient may  shower with shower cap             -ELOS/Goals: 14-16d -CGA to Min A goals -DC date 05/12/24  - Stable to continue inpatient rehab  - 1/13: Going home with  2 daughters, one is CNA. Min to Mod A UB and Max A LB ADLs; poor body  orientation. Other evals pending.   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  He is understanding   - 1/20: Min A transfers and walker 130 ft yesterday; Mod- Max A today handheld assist due to fatigue. Min-Mod A UB care, Mod A LB care due to poor midline orientation and balance. Mild dysarthria and aprosity. Some attention/reasoning deficits c/b personality.  - 1-23: Discussed discharge date with patient, wife.  They have considerable concerns that he is not yet ambulatory, did discuss that he is expected to need some assistance at discharge and independent ambulation is not expected.  Answered all questions.  - Likely custom AFO needed closer to discharge    2.  Antithrombotics: -DVT/anticoagulation:  Pharmaceutical: Heparin  5kU BID initiated 04/14/2024             -antiplatelet therapy: N/A  1-21: Duplex right lower extremity negative  3. Pain Management: Tylenol  as needed  -1-13 discussed positioning off of surgical site at nighttime.  Pain well-controlled on current regimen. - 1-14: Voltaren  gel 4 times daily to right ankle and calf.  Discussed bracing with therapies.  Add baclofen  5 mg 3 times daily for developing tightness/muscle aching on the right hemibody.  Consider right hip x-ray for arthritis if groin pain becomes worse.  1/15: Pain much improved with current regimen, monitor 1-17: Occipital nerve distribution of odd sensations across his head, not painful, does not want additional treatments at this time  -1/19 Will order Vas US  r/o DVT but think it is less likely--ultrasound negative  1-20: Increase baclofen  to 10 mg 3 times daily--improved pain tolerance  1-21: Increase nightly gabapentin  to 600 mg for calf and hand  pain 1-22: Oversedated with current regimen; dropped gabapentin  back to 300 nightly, reduce baclofen  to 5 mg 3 times daily, add Robaxin  500 mg every 8 hours as needed for spasms--sedation improved 1-23 1-26: Still feeling cognitively slow, stop nightly gabapentin   4. Mood/Behavior/Sleep: Provide emotional support             -antipsychotic agents: N/A   - sleeping well, apporpriate  5. Neuropsych/cognition: This patient is capable of making decisions on his own behalf.  6. Skin/Wound Care: Routine skin checks - 1-14:   will reach out to Dr. [name] regarding DC staples POD 10 tomorrow--OK, order placed 1/15 1-16: Some uneven edges on the surgical line in the lower portion, reinforced with Steri-Strips today.  No drainage or open wound bed, no concern for dehiscence or infection--looking much better --removed, Steri-Strips 1-23  7. Fluids/Electrolytes/Nutrition: Routine and analysis with follow-up chemistries, continue vitamins/supplements   - labs stable on admission; follow up  8.  Diabetes mellitus.  Hemoglobin A1c 7.2.  Glucophage  500 mg twice daily, Lantus  insulin  8 units nightly - 1-18: Blood sugars well-controlled, have not resumed home Lantus .  Continue as needed insulin , using rarely, may be able to reduce blood glucose checks this week -1/19 well-controlled continue current regimen and monitor -1-23: Tolerating metformin , rare use of insulin  staying below 2 units.  Will monitor through the weekend  -05/02/24 CBGs looking great, would consider cutting back to BID checks if trend continues 1/26: No elevations >  120; DC BG checks and insulin   Recent Labs    05/03/24 1635 05/03/24 2044 05/04/24 0533  GLUCAP 99 87 111*     9.  Hyperlipidemia.  Crestor  20mg  nightly  10.  Hypertension.  Norvasc  5 mg daily, Atenolol  50 mg daily, monitor with increased mobility -Blood pressure, vital stable.  Vitals:   04/30/24 1959 05/01/24 0629 05/01/24 1604 05/01/24 1944  BP: 128/73 114/64  118/68 (!) 124/59   05/02/24 1328 05/02/24 1957 05/03/24 0600 05/03/24 0844  BP: 106/66 (!) 113/58 113/64 116/61   05/03/24 1435 05/03/24 2020 05/04/24 0429 05/04/24 0813  BP: (!) 115/55 124/69 111/65 114/69    11.  Class II obesity.  BMI 35.29.  Dietary follow-up  12.  Constipation/IBS.  MiraLAX  daily, Senokot 1 tablet nightly, colace 100mg  BID   - 1/12 LBM--may need laxatives increase tomorrow if no improvement  -1/25 LBM, medium  13.  COPD.  History of tobacco use.  Monitor oxygen saturations.  Discussed no nicotine. 1-20: Patient to bring in portable CPAP machine from home; nursing informed to assess and set up--pending  14. Urinary urgency. Wean off purwick.    - no incontinence, doing well  15. Left vision deficits.  Continue glasses taping, therapies.  - 1-17:  resolved today.  16.  Gastric reflux.  Resume Pepcid  40 mg daily, Protonix  40mg  nightly, Tums 3 times daily as needed   -Symptoms well-controlled on current regimen  17.  Stage IIIa CKD.  Baseline creatinine appears 1.1-1.4  -  Some fluctuations, but at baseline.   LOS: 14 days A FACE TO FACE EVALUATION WAS PERFORMED  Larry Dawson 05/04/2024, 9:19 AM     "

## 2024-05-04 NOTE — Plan of Care (Signed)
" °  Problem: Consults Goal: RH STROKE PATIENT EDUCATION Description: See Patient Education module for education specifics  Outcome: Progressing   Problem: RH BOWEL ELIMINATION Goal: RH STG MANAGE BOWEL WITH ASSISTANCE Description: STG Manage Bowel with supervision Assistance. Outcome: Progressing   Problem: RH BLADDER ELIMINATION Goal: RH STG MANAGE BLADDER WITH ASSISTANCE Description: STG Manage Bladder With supervision  Assistance Outcome: Progressing   Problem: RH SKIN INTEGRITY Goal: RH STG SKIN FREE OF INFECTION/BREAKDOWN Description: Skin free of infection with supervision assistance Outcome: Progressing   Problem: RH SAFETY Goal: RH STG ADHERE TO SAFETY PRECAUTIONS W/ASSISTANCE/DEVICE Description: STG Adhere to Safety Precautions With supervision Assistance/Device. Outcome: Progressing   Problem: RH PAIN MANAGEMENT Goal: RH STG PAIN MANAGED AT OR BELOW PT'S PAIN GOAL Description: <4 w/ prn Outcome: Progressing   Problem: RH KNOWLEDGE DEFICIT Goal: RH STG INCREASE KNOWLEDGE OF DIABETES Description: Manage increase knowledge of diabetes with supervision assistance from wife using educational materials provided  Outcome: Progressing Goal: RH STG INCREASE KNOWLEDGE OF HYPERTENSION Description: Manage increase knowledge of hypertension with supervision assistance from wife using educational materials provided  Outcome: Progressing   "

## 2024-05-04 NOTE — Progress Notes (Signed)
 Physical Therapy Note  Patient Details  Name: Larry Dawson MRN: [id number] Date of Birth: 1955-12-15 Today's Date: 05/04/2024    Due to the [place]  state of emergency, patients may not receive 3.0 hours of therapy services.    [name] 05/04/2024, 11:02 AM

## 2024-05-04 NOTE — Op Note (Signed)
 DATE OF SURGERY: 04/13/2024   ATTENDING SURGEON: Dino Sable, MD   ASSISTANT: Jayson Eis, NP   PREOPERATIVE DIAGNOSIS: Brainstem Cavernous Malformation   POSTOPERATIVE DIAGNOSIS: Same    PROCEDURE PERFORMED:  1. Left far lateral posterior fossa craniotomy for resection of brainstem AVM 2. Use of microscope for microscopic dissection 3. Use of neuronavigation for localization and assessment of extent of resection 4. Duraplasty greater than 5 cm 5. Cranioplasty greater than 5 cm  ANESTHESIA: General endotracheal anesthesia.     ESTIMATED BLOOD LOSS, URINE OUTPUT, AND CRYSTALLOIDS:   See anesthesia chart.     COMPLICATIONS: None.     SPECIMENS: Arteriovenous malformation   DRAINS: None   PREOPERATIVE COURSE:   69 year old gentleman with a history of repetitive hemorrhages of the brainstem with steady neurological decline.  I had seen him initially in the outpatient setting and we had decided to pursue observation however he returned to the hospital with multiple repeat hemorrhages.  Options were discussed with the patient and his wife and 3 daughters.  All this has been documented extensively in the notes I talked to them about doing nothing versus stereotactic radiosurgery versus surgical resection but given his deterioration, they opted for the resection.  The risk of the surgery discussed include but not limited to infection, hemorrhage, stroke, paralysis, blindness, speech impairment, paralysis of all 4 extremities, facial numbness, facial paralysis, hearing loss, swallowing difficulty to an extent that it requires a feeding tube permanently, need for tracheostomy and a permanent ventilator dependency, failure of treatment, CSF leak alongside cardiopulmonary complications DVT/PE and death among others.  All these were discussed in great detail with them in layman's terms and they requested for us  to proceed.   DESCRIPTION OF PROCEDURE: Patient was brought to the  operating room and after general trach anesthesia was ensued he was placed in a lateral position on a beanbag with all pressure points padded and an axillary roll underneath the lower axilla his body was secured to the table and the head was secured in the [place] head holder.  Registration was obtained.  Hair was removed using Cli[place] after which a curvilinear incision was outlined and he was prepped and draped in usual sterile fashion prior to this monitoring needles were placed by the monitoring team.  Using #10 blade an incision was made and retractors were placed.  The muscles were bisected and the occipital bone was thinned out and the dura exposed.  The posterior edge of the sigmoid sidedness as well as the inferior edge of the transverse sinus were exposed.  Once I had good dural exposure, the edges of the bone were waxed off to cover air cells.  A V shaped dural incision was made and the dura was tacked up.  The microscope was then brought in for microscopic dissection and CSF aspiration was performed and gentle retraction of the cerebellum as well.  I was able to continue down to the brainstem with with gentle retraction and with navigation and visual confirmation I identified the trigeminal nerve and chose to do a myelotomy superior to at where with navigation I identified the point where the cavernoma came the closest to the surface.  A 3 mm myelotomy was performed and I could readily identify the cavernoma.  With gentle dissection I was able to dissect the cavernoma free and the hematoma evacuated itself.  In this fashion I will circumferentially able to get around the cavernoma I was able to remove it in 2 pieces.  Careful inspection  did not show any more residual.  During the entire procedure and there was no change in SSEPs, motor evoked potentials and no stimulation to any of the cranial nerves.  After this was done with navigation I confirmed the resection and the microscope was taken out and the  cavity filled with lukewarm saline duraplasty was performed with artificial dura after which a cranioplasty was performed with Hydrocet.  Copious irrigation was performed with antibiotic saline after which the incision was closed in multiple layers.  At the end of the procedure all counts were complete.  Jayson Eis, NP was present for the entire procedure. He assisted in patient positioning, draping, exposure, wound closure, dressing application, and patient transfer. His assistance was necessary for patient safety and surgical efficiency.   It should be noted that Stereotactic computer-assisted navigation was indicated due to patient's anatomy. Films were reviewed for preop planning. Registration was personally performed by the me. Visual confirmation was done. Navigation was utilized during different portions of the surgery, including on entry to the brain and localization of the tumor.

## 2024-05-05 NOTE — Progress Notes (Signed)
 Occupational Therapy Session Note  Patient Details  Name: Larry Dawson MRN: [id number] Date of Birth: 01/20/1956  Today's Date: 05/05/2024 OT Individual Time: 8694-8654 OT Individual Time Calculation (min): 40 min   Short Term Goals: Week 2:  OT Short Term Goal 1 (Week 2): Pt will thread LB garments with Min A. OT Short Term Goal 2 (Week 2): Pt will perform UB care with Min A + Min cues for modification techniques integration. OT Short Term Goal 3 (Week 2): Pt will perform toilet transfer with Mod A (x1).  Skilled Therapeutic Interventions/Progress Updates:  Pt greeted in [facility] for skilled OT session with focus on RLE motor planning and coordination for carry over into ADLs, functional transfers, and reduce fall risk.  Pain: Pt with no reports of pain. OT offering intermediate rest breaks and positioning suggestions throughout session to address pain/fatigue and maximize participation/safety in session.   Functional Transfers: Pt required CGA for stand-step transfer from WC<>EOM. Pt required CGA for series of sit<>stands with the walker throughout the session. Intermittent cues for safe hand placement to control descent onto surface.  Therapeutic Activities: Pt completed standing activity to improve RLE motor planning and coordination. A weighted dowel was placed on the floor and the pt was instructed to step over the obstacle by lifting each foot off the ground, forwards and backwards. An ankle weight was placed on the pts RLE for proprioceptive input. Pt required CGA to Min-A to maintain standing (utilizing the RW). Pt also required verbal and tactile cues to improve clearance of RLE and weight shift onto LLE. Pt experienced increased difficulty with stepping forward with the LLE and stepping backwards with the RLE. Anticipate due to LLE weakness during WB and motor planning deficits. Activity upgraded by removing RW, pt completed 4 reps requiring up to Min/Mod A to maintain standing. Increased  effort noted with this version of activity. OTS promoted seated rest breaks for improved performance throughout session.   Pt remained in [facility] with 4Ps assessed and immediate needs met. Pt continues to be appropriate for skilled OT intervention to promote further functional independence in ADLs/IADLs.  Therapy Documentation Precautions:  Precautions Precautions: Fall Recall of Precautions/Restrictions: Impaired Precaution/Restrictions Comments: R-hemiparesis/hemiplegia Restrictions Weight Bearing Restrictions Per Provider Order: No   Therapy/Group: Individual Therapy  Misty Pacini, OTS 05/05/2024, 7:52 AM

## 2024-05-05 NOTE — Progress Notes (Signed)
 Physical Therapy Session Note  Patient Details  Name: Larry Dawson MRN: [id number] Date of Birth: 26-Dec-1955  Today's Date: 05/05/2024 PT Individual Time: 1454-1534 PT Individual Time Calculation (min): 40 min   Short Term Goals: Week 2:  PT Short Term Goal 1 (Week 2): Pt will complete transfers consistently with minA and LRAD PT Short Term Goal 2 (Week 2): Pt will complete gait 150' with min assist and LRAD PT Short Term Goal 3 (Week 2): Pt will complete up/down 4 steps with mod assist  Skilled Therapeutic Interventions/Progress Updates:    Pt presents in room in [facility], agreeable to PT, pt wife present and watches throughout session with education provided throughout. Pt does not report pain during session. Session focused on gait training for upright tolerance, multidirectional stepping stability, as well as stair negotiation. Pt transported to day room via [facility] dependenlty for time management. Pt completes sit to stand to RW with min assist. Pt ambulates with RW with R hemi splint, min assist for gait with pt demonstrating significant improvement from previous session with RLE foot placement and midline orientation. Pt transported to main gym via [facility] and set up in front of 6 steps. Pt completes up/down 4 steps with BHRs with mod assist for postural stability 2nd person SBA for safety however pt able to sequencing up/down stairs with mod to min cues, ascending with LLE leading descending with RLE leading, step to gait. Pt completes 3 trials with seated rest break between. Pt completes gait training completing figure 8 around 2 cones x4 trials to promote improved stability and RW management with turns, requires min assist only. Pt then completes gait training forward/backward walking with min assist and min cues for sequencing stepping and RW management. Pt completes gait back to room ~200' with min assist and ambulates into bathroom with min/mod assist for managing RW over thresholding, requires min  assist for postural stability while managing pants and completes transfer with min assist. Pt remains seated on toilet, instructed to pull cord when finished and NT notified of pt position at end of session.  Therapy Documentation Precautions:  Precautions Precautions: Fall Recall of Precautions/Restrictions: Impaired Precaution/Restrictions Comments: R-hemiparesis/hemiplegia Restrictions Weight Bearing Restrictions Per Provider Order: No   Therapy/Group: Individual Therapy  Reche Ohara PT, DPT 05/05/2024, 4:22 PM

## 2024-05-05 NOTE — Progress Notes (Signed)
 Orthopedic Tech Progress Note Patient Details:  Larry Dawson [birth year] [id number]  Called in order to [facility] for an AFO CONSULT   Patient ID: Larry Dawson, male   DOB: [birth year], 69 y.o.   MRN: [id number]  Larry Dawson Pac 05/05/2024, 10:47 AM

## 2024-05-05 NOTE — Progress Notes (Signed)
 Orthopedic Tech Progress Note Patient Details:  JERMIAH SODERMAN 1956-02-06 [id number]  Called in order to [facility] for an AFO CONSULT   Patient ID: ZERICK PREVETTE, male   DOB: 04/09/1956, 69 y.o.   MRN: [id number]  Delanna LITTIE Pac 05/05/2024, 10:45 AM

## 2024-05-05 NOTE — Progress Notes (Signed)
 Patient ID: BRYLEE MCGREAL, male   DOB: 1955/04/19, 69 y.o.   MRN: [id number]  31- SW spoke with pt wife to provide updates from team conference, and inform on change in d/c date from 2/3 to 2/6 due to gains made in rehab. She confirms family edu tomorrow.   SW met with pt in room to inform on above.   SW later gave pt and wife handicap placard.   Graeme Jude, MSW, LCSW Office: [phone] Cell: [phone] Fax: [phone]

## 2024-05-05 NOTE — Progress Notes (Signed)
 Speech Language Pathology Daily Session Note  Patient Details  Name: Larry Dawson MRN: [id number] Date of Birth: Mar 11, 1956  Today's Date: 05/05/2024 SLP Individual Time: 8967-8886 SLP Individual Time Calculation (min): 41 min  Short Term Goals: Week 2: SLP Short Term Goal 1 (Week 2): Patient will selectively attend to therapy tasks for 10 minutes given supervision assist. SLP Short Term Goal 2 (Week 2): Patient will solve complex functional problems @ modI SLP Short Term Goal 3 (Week 2): Patient will utilize SLOP speech intelligibility strategies at the conversation level @ modI  Skilled Therapeutic Interventions: SLP conducted skilled therapy session targeting cognitive goals. SLP facilitated complex deduction puzzle where patient benefited from supervision fading to modI for accurate deduction of all answers. Patient selectively attended to task with supervision-modI for duration of session! Patient was left in room with call bell in reach and alarm set. SLP will continue to target goals per plan of care.        Pain Pain Assessment Pain Scale: 0-10 Pain Score: 2  Pain Location: Head Pain Orientation: Posterior Pain Descriptors / Indicators: Headache Pain Frequency: Occasional Pain Intervention(s): Medication (See eMAR)  Therapy/Group: Individual Therapy  Larry Dawson, M.A., CCC-SLP  Terril Chestnut A Arnett Galindez 05/05/2024, 11:13 AM

## 2024-05-05 NOTE — Plan of Care (Signed)
" °  Problem: RH Functional Use of Upper Extremity Goal: LTG Patient will use RT/LT upper extremity as a (OT) Description: LTG: Patient will use right/left upper extremity as a stabilizer/gross assist/diminished/nondominant/dominant level with assist, with/without cues during functional activity (OT) Flowsheets (Taken 05/05/2024 0758) LTG: Use of upper extremity in functional activities: RUE as diminished level LTG: Pt will use upper extremity in functional activity with assistance level of: Supervision/Verbal cueing   "

## 2024-05-05 NOTE — Progress Notes (Signed)
 Occupational Therapy Session Note  Patient Details  Name: Larry Dawson MRN: [id number] Date of Birth: 09-24-1955  {CHL IP REHAB OT TIME CALCULATIONS:[id number]}   Short Term Goals: Week 3:  OT Short Term Goal 1 (Week 3): STGs=LTGs due to ELOS.  Skilled Therapeutic Interventions/Progress Updates:  Pt greeted *** for skilled OT session with focus on ***.   Pain: Pt reported ***/10 pain, stating *** in reference to ***. OT offering intermediate rest breaks and positioning suggestions throughout session to address pain/fatigue and maximize participation/safety in session.   Functional Transfers:  Self Care Tasks: Pt completes the following self care tasks with levels of assistance noted below, UB: LB:   Therapeutic Activities:  Therapeutic Exercise:   Education:  Pt remained *** with 4Ps assessed and immediate needs met. Pt continues to be appropriate for skilled OT intervention to promote further functional independence in ADLs/IADLs.    Therapy Documentation Precautions:  Precautions Precautions: Fall Recall of Precautions/Restrictions: Impaired Precaution/Restrictions Comments: R-hemiparesis/hemiplegia Restrictions Weight Bearing Restrictions Per Provider Order: No   Therapy/Group: Individual Therapy  Nereida Habermann, OTR/L, MSOT  05/05/2024, 5:51 PM

## 2024-05-05 NOTE — Patient Care Conference (Signed)
 Inpatient RehabilitationTeam Conference and Plan of Care Update Date: 05/05/2024   Time: 1033 am    Patient Name: Larry Dawson      Medical Record Number: [id number]  Date of Birth: 08/28/1955 Sex: Male         Room/Bed: 4W11C/4W11C-01 Payor Info: Payor: [employer] MEDICARE / Plan: [employer] MEDICARE HMO / Product Type: *No Product type* /    Admit Date/Time:  04/20/2024  2:26 PM  Primary Diagnosis:  Cavernoma  Hospital Problems: Principal Problem:   Cavernoma Active Problems:   Coping style affecting medical condition    Expected Discharge Date: Expected Discharge Date: 05/15/24  Team Members Present: Physician leading conference: Dr. Joesph Likes Social Worker Present: Graeme Jude, LCSW Nurse Present: Eulalio Falls, RN PT Present: Catilin Osborn, PT OT Present: Nereida Habermann, OT SLP Present: Recardo Mole, SLP     Current Status/Progress Goal Weekly Team Focus  Bowel/Bladder   Continent of b/b  *** Remain continent   Assist with toileting qshift and prn    Swallow/Nutrition/ Hydration      ***         ADL's   Setup to Min-A for UB care, Min-A/Mod-A for LB care/toileting. RUE is at a gross assist level for ADLs with cues for integration. Barriers: dynamic standing balance, decreased functional use of RUE, decreased functional reach towards distal LEs.  *** Supervision to Min A   Caregiver education, functional transfers, standing balance with unilateral support, RUE/RLE NMR    Mobility   supervision bed mob, transfers min assist with RW, gait 540' with RW min/modA with +2 WC follow  *** supervision ambulatory  barriers: R inattention and poor midline orientation, R lateropulsion; focus on gait training, NMR for R hemi and midline orientation    Communication   very mild dysarthria remains - imprecise articulation and some irregular prosody - @ goal level (modI)  *** modI   pt/family education, generalization of speech    Safety/Cognition/ Behavioral  Observations  very mild cognitive deficits remain w/ problem solving, reasoning, and complex attention  - 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 PRN support from his daughters. Fam edu on Wed 9am-12pm with his wife- SW will confirm this will still occur due to weather. SW will confirm there are no barriers to discharge.    Team Discussion: *** Patient on target to meet rehab goals: {IP REHAB YES/NO WITH TPOIRJMID:75863}  *See Care Plan and progress notes for long and short-term goals.   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, diabetes, COPD/OSA, spasticity, hemiparesis and hemisensory deficits, depression  Barriers to Discharge: Behavior/Mood;Medical stability;Self-care education;Uncontrolled Pain   Possible Resolutions to [name]: treat pain and spasticity with minimally sedating regimen, titrate diabetes regimen as needed with DC CBGs, monitor vitals for hypertension and COPD, provide emotional support   Continued Need for Acute Rehabilitation Level of Care: The patient requires daily  medical management by a physician with specialized training in physical medicine and rehabilitation for the following reasons: Direction of a multidisciplinary physical rehabilitation program to maximize functional independence : Yes Medical management of patient stability for increased activity during participation in an  intensive rehabilitation regime.: Yes Analysis of laboratory values and/or radiology reports with any subsequent need for medication adjustment and/or medical intervention. : Yes   I attest that I was present, lead the team conference, and concur with the assessment and plan of the team.   Samit Sylve Gayo 05/05/2024, 1033 am

## 2024-05-05 NOTE — Progress Notes (Signed)
 "                                                        PROGRESS NOTE   Subjective/Complaints:  No events overnight. Vitals stable. Mild HA this AM, he attributes this to difficulty getting good neck support with the hospital pillows and will be asking his wife to bring in pillows from home.  Headache responds to Tylenol  and resolves through the morning.  Ongoing concern about strength deficits in right upper and lower extremity, and difficulty with ambulation.  Does acknowledge that he is doing better, but is somewhat frustrated with slowness of recovery.  Additionally, noticing some delay in processing which he describes as walking through a quagmire, but does not think his memory or reasoning ability has been significantly affected.  He does not want medications to address this.  Large BM 1/26, continent b/b.   ROS: as per HPI. Denies CP, SOB, abd pain, N/V/D/C, or any other complaints at this time.   + Right upper and lower extremity aching--ongoing  Objective:   No results found.  Recent Labs    05/04/24 0455  WBC 5.5  HGB 13.4  HCT 39.5  PLT 253    Recent Labs    05/04/24 0455  NA 140  K 4.1  CL 102  CO2 30  GLUCOSE 115*  BUN 13  CREATININE 1.06  CALCIUM  9.4     Intake/Output Summary (Last 24 hours) at 05/05/2024 0934 Last data filed at 05/05/2024 0900 Gross per 24 hour  Intake 480 ml  Output 200 ml  Net 280 ml        Physical Exam: Vital Signs Blood pressure 120/65, pulse 67, temperature 97.8 F (36.6 C), temperature source Oral, resp. rate 17, height 5' 7 (1.702 m), weight 95.7 kg, SpO2 97%.  Physical Exam  Constitutional: No apparent distress. Appropriate appearance for age.  Sitting up in bedside chair. HENT: Status post left basal craniotomy, stable Eyes: PERRLA.  EOMI.  No obvious nystagmus. Cardiovascular: RRR, no murmurs/rub/gallops. No Edema. Peripheral pulses 2+  Respiratory: CTAB. No rales, rhonchi, or wheezing. On RA.  Abdomen: +  bowel sounds, normoactive. No distention or tenderness.  Skin: Posterior surgical site well-approximated and healing   MSK:      No apparent deformity.        No current tenderness to palpation of right lower extremity  Neurologic exam:  Cognition: AAO to person, place, time and event.  + Slight cognitive delay--ongoing, but overall improved  Language: Moderate dysarthria, with some speech delay --unchanged Insight: Fair insight into current condition.  Mood: Pleasant affect, appropriate mood.  Sensation: Reduced to light touch throughout the right hemibody Reflexes: 2+ in BL UE and LEs. Negative Hoffman's and babinski signs bilaterally.  CN:  +facial droop.   Coordination: Right upper and lower extremity ataxia, complicated by weakness Spasticity: MAS 1 right wrist flexors, finger flexors, elbow flexors, plantar flexors; MAS 2 right knee flexor       Strength:                RUE: 3+/5 SA, 3+/5 EF, 3+/5 EE, 4/5 WE, 4+/5 FF, 4/5 FA--improving                LUE:  5/5 SA, 5/5 EF, 5/5 EE, 5/5 WE, 5/5 FF, 5/5  FA                RLE: 3/5 HF, 4-/5 KE, 0/5  DF, 0/5  EHL, 4/5  PF                LLE:  5/5 HF, 5/5 KE, 5/5  DF, 5/5  EHL, 5/5  PF     Assessment/Plan: 1. Functional deficits which require 3+ hours per day of interdisciplinary therapy in a comprehensive inpatient rehab setting. Physiatrist is providing close team supervision and 24 hour management of active medical problems listed below. Physiatrist and rehab team continue to assess barriers to discharge/monitor patient progress toward functional and medical goals  Care Tool:  Bathing    Body parts bathed by patient: Right arm, Chest, Abdomen, Front perineal area, Right upper leg, Left upper leg, Face   Body parts bathed by helper: Left arm, Right lower leg, Left lower leg, Buttocks     Bathing assist Assist Level: Moderate Assistance - Patient 50 - 74%     Upper Body Dressing/Undressing Upper body dressing   What is  the patient wearing?: Pull over shirt    Upper body assist Assist Level: Minimal Assistance - Patient > 75%    Lower Body Dressing/Undressing Lower body dressing      What is the patient wearing?: Pants     Lower body assist Assist for lower body dressing: 2 Helpers     Toileting Toileting    Toileting assist Assist for toileting: 2 Helpers     Transfers Chair/bed transfer  Transfers assist     Chair/bed transfer assist level: Moderate Assistance - Patient 50 - 74%     Locomotion Ambulation   Ambulation assist      Assist level: 2 helpers Assistive device: Other (comment) (hand rail) Max distance: 30   Walk 10 feet activity   Assist     Assist level: 2 helpers Assistive device: Other (comment) (hand rail)   Walk 50 feet activity   Assist Walk 50 feet with 2 turns activity did not occur: Safety/medical concerns         Walk 150 feet activity   Assist Walk 150 feet activity did not occur: Safety/medical concerns         Walk 10 feet on uneven surface  activity   Assist Walk 10 feet on uneven surfaces activity did not occur: Safety/medical concerns         Wheelchair     Assist Is the patient using a wheelchair?: Yes Type of Wheelchair: Manual    Wheelchair assist level: Maximal Assistance - Patient 25 - 49% Max wheelchair distance: 44'    Wheelchair 50 feet with 2 turns activity    Assist        Assist Level: Maximal Assistance - Patient 25 - 49%   Wheelchair 150 feet activity     Assist      Assist Level: Total Assistance - Patient < 25%   Blood pressure 120/65, pulse 67, temperature 97.8 F (36.6 C), temperature source Oral, resp. rate 17, height 5' 7 (1.702 m), weight 95.7 kg, SpO2 97%.    Medical Problem List and Plan: 1. 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 Janjua             -patient may  shower with shower cap              -ELOS/Goals: 14-16d -CGA to Min A goals -DC  date 05/12/24--> 05/15/24  - Stable to continue inpatient rehab  - 1/13: Going home with 2 daughters, one is CNA. Min to Mod A UB and Max A LB ADLs; poor body orientation. Other evals pending.   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  He is understanding   - 1/20: Min A transfers and walker 130 ft yesterday; Mod- Max A today handheld assist due to fatigue. Min-Mod A UB care, Mod A LB care due to poor midline orientation and balance. Mild dysarthria and aprosity. Some attention/reasoning deficits c/b personality.   - 1-23: Discussed discharge date with patient, wife.  They have considerable concerns that he is not yet ambulatory, did discuss that he is expected to need some assistance at discharge and independent ambulation is not expected.  Answered all questions.  - 1/27: Family education tomorrow. Family very hesitant to bring him home without being functionally independent. SPV bed mobility, Min A RW, walked 540 ft Min-Mod A with RW yesterday - with significant loss of balance. Improving with midline orientation and lateropulsion. Setup-Min A UB care; doing better with standing, shower with CGA for standing balance today. RUE improving. SPV-Min A goals. Mild dysarthria but at goal level Mod I for communication. Mild cog deficits with complex attention c/b mental inflexibility - SPV, goal Mod I.   - Likely custom AFO needed closer to discharge--ordered 1/27 for eval later this week    2.  Antithrombotics: -DVT/anticoagulation:  Pharmaceutical: Heparin  5kU BID initiated 04/14/2024             -antiplatelet therapy: N/A  1-21: Duplex right lower extremity negative  3. Pain Management:  Tylenol  as needed  -1-13 discussed positioning off of surgical site at nighttime.  Pain well-controlled on current regimen. - 1-14: Voltaren  gel 4 times daily to right ankle and calf.  Discussed bracing with therapies.  Add baclofen  5 mg 3 times daily for developing tightness/muscle aching on the right hemibody.  Consider right hip x-ray for arthritis if groin pain becomes worse.  1/15: Pain much improved with current regimen, monitor 1-17: Occipital nerve distribution of odd sensations across his head, not painful, does not want additional treatments at this time  -1/19 Will order Vas US  r/o DVT but think it is less likely--ultrasound negative  1-20: Increase baclofen  to 10 mg 3 times daily--improved pain tolerance  1-21: Increase nightly gabapentin  to 600 mg for calf and hand pain 1-22: Oversedated with current regimen; dropped gabapentin  back to 300 nightly, reduce baclofen  to 5 mg 3 times daily, add Robaxin  500 mg every 8 hours as needed for spasms--sedation improved 1-23 1-26: Still feeling cognitively slow, stop nightly gabapentin --no changes noted per patient 1-27 1-27: Daily AM headache, responsive to Tylenol , patient will be asking his wife to bring in pillows from home for better neck support.  4. Mood/Behavior/Sleep: Provide emotional support             -antipsychotic agents: N/A   - sleeping well, apporpriate  5. Neuropsych/cognition: This patient is capable of making decisions on his own behalf.  - 1-27: Discussed cognitive deficits with patient, difficulty with attention.  Discussed starting neurostimulant to help with this; patient declines at this time.  6. Skin/Wound Care: Routine skin checks -  1-14:   will reach out to Dr. Janjua regarding DC staples POD 10 tomorrow--OK, order placed 1/15 1-16: Some uneven edges on the surgical line in the lower portion, reinforced with Steri-Strips today.  No drainage or open wound bed, no concern for dehiscence or infection--looking much  better  1-27: Skin site looking good, can wait for Steri-Strips to follow-up for removal if needed later this week  7. Fluids/Electrolytes/Nutrition: Routine and analysis with follow-up chemistries, continue vitamins/supplements   - labs stable on admission; follow up  8.  Diabetes mellitus.  Hemoglobin A1c 7.2.  Glucophage  500 mg twice daily, Lantus  insulin  8 units nightly - 1-18: Blood sugars well-controlled, have not resumed home Lantus .  Continue as needed insulin , using rarely, may be able to reduce blood glucose checks this week -1/19 well-controlled continue current regimen and monitor -1-23: Tolerating metformin , rare use of insulin  staying below 2 units.  Will monitor through the weekend  -05/02/24 CBGs looking great, would consider cutting back to BID checks if trend continues 1/26: No elevations >120; DC BG checks and insulin     9.  Hyperlipidemia.  Crestor  20mg  nightly  10.  Hypertension.  Norvasc  5 mg daily, Atenolol  50 mg daily, monitor with increased mobility -Blood pressure, vital stable.  Vitals:   05/01/24 1944 05/02/24 1328 05/02/24 1957 05/03/24 0600  BP: (!) 124/59 106/66 (!) 113/58 113/64   05/03/24 0844 05/03/24 1435 05/03/24 2020 05/04/24 0429  BP: 116/61 (!) 115/55 124/69 111/65   05/04/24 0813 05/04/24 1331 05/04/24 1924 05/05/24 0453  BP: 114/69 (!) 113/59 123/77 120/65    11.  Class II obesity.  BMI 35.29.  Dietary follow-up  12.  Constipation/IBS.  MiraLAX  daily, Senokot 1 tablet nightly, colace 100mg  BID   - 1/12 LBM--may need laxatives increase tomorrow if no improvement  -1/25 LBM, medium  13.  COPD.  History of tobacco use.  Monitor oxygen saturations.  Discussed no nicotine. 1-20: Patient to bring in portable CPAP machine from home; nursing informed to assess and set up--pending  14. Urinary urgency. Wean off purwick.    - no incontinence, doing well  15. Left vision deficits.  Continue glasses taping, therapies.  - 1-17:  resolved  today.  16.  Gastric reflux.  Resume Pepcid  40 mg daily, Protonix  40mg  nightly, Tums 3 times daily as needed   -Symptoms well-controlled on current regimen  17.  Stage IIIa CKD.  Baseline creatinine appears 1.1-1.4  -  Some fluctuations, but at baseline.   LOS: 15 days A FACE TO FACE EVALUATION WAS PERFORMED  Joesph JAYSON Likes 05/05/2024, 9:34 AM     "

## 2024-05-06 ENCOUNTER — Inpatient Hospital Stay (HOSPITAL_COMMUNITY)

## 2024-05-06 DIAGNOSIS — D1800 Hemangioma unspecified site: Secondary | ICD-10-CM | POA: Diagnosis not present

## 2024-05-06 LAB — BASIC METABOLIC PANEL WITH GFR
Anion gap: 11 (ref 5–15)
BUN: 11 mg/dL (ref 8–23)
CO2: 26 mmol/L (ref 22–32)
Calcium: 9.5 mg/dL (ref 8.9–10.3)
Chloride: 102 mmol/L (ref 98–111)
Creatinine, Ser: 1.09 mg/dL (ref 0.61–1.24)
GFR, Estimated: >60 mL/min
Glucose, Bld: 131 mg/dL — ABNORMAL HIGH (ref 70–99)
Potassium: 3.9 mmol/L (ref 3.5–5.1)
Sodium: 138 mmol/L (ref 135–145)

## 2024-05-06 LAB — GLUCOSE, CAPILLARY: Glucose-Capillary: 106 mg/dL — ABNORMAL HIGH (ref 70–99)

## 2024-05-06 NOTE — Progress Notes (Signed)
 Occupational Therapy Session Note  Patient Details  Name: Larry Dawson MRN: [id number] Date of Birth: October 21, 1955  Today's Date: 05/06/2024 OT Individual Time: 8684-8654 OT Individual Time Calculation (min): 30 min  and Today's Date: 05/06/2024 OT Missed Time: 10 Minutes Missed Time Reason: Other (comment) (MD rounding)  Short Term Goals: Week 2:  OT Short Term Goal 1 (Week 2): Pt will thread LB garments with Min A. OT Short Term Goal 1 - Progress (Week 2): Met OT Short Term Goal 2 (Week 2): Pt will perform UB care with Min A + Min cues for modification techniques integration. OT Short Term Goal 2 - Progress (Week 2): Met OT Short Term Goal 3 (Week 2): Pt will perform toilet transfer with Mod A (x1). OT Short Term Goal 3 - Progress (Week 2): Met  Skilled Therapeutic Interventions/Progress Updates:  Pt greeted in [facility] for skilled OT session with focus on RUE NMR. Session delayed due to MD rounding.   Pain: Pt with no reports of pain, but stated feeling off throughout the session. OT offering intermediate rest breaks and positioning suggestions throughout session to address pain/fatigue and maximize participation/safety in session.   Functional Transfers: Pt remained in WC during therapeutic exercises. Pt required Min A to ambulate with the walker back to the bed due to difficulties maintaining midline orientation and advancing RLE (significant change in comparison to morning session). Treatment team made aware of functional changes. Mod A was required to elevate BLEs into bed.   Therapeutic Exercise: Pt completed digit flexion/extension, wrist extension, digit ab/adduction, supination/pronation and thumb flexion/extension exercises utilizing mirror therapy. 2 sets of ~10-12 repetitions were completed of each exercise. Pt was educated on the benefits of mirror therapy in restoring function in hemibody. Pt required min verbal and tactile assistance to maintain attention to reflection and  perform digit isolation exercises.   Pt completed 3-point pinch and thumb flexion/extension exercises with the RUE, utilizing yellow theraputty. Pt required visual demonstrations and verbal cues to perform exercises. Pt utilized RUE to retrieve small beads and tokens from within theraputty. Pt able to dig objects out of putty but demonstrated difficulty with pincer grasp. With increased time, able to successfully retrieve x1 token. Pt instructed in continuing to attempt activity outside of scheduled therapy time.  Pt remained in bed with 4Ps assessed and immediate needs met. Pt continues to be appropriate for skilled OT intervention to promote further functional independence in ADLs/IADLs.   Therapy Documentation Precautions:  Precautions Precautions: Fall Recall of Precautions/Restrictions: Impaired Precaution/Restrictions Comments: R-hemiparesis/hemiplegia Restrictions Weight Bearing Restrictions Per Provider Order: No  Therapy/Group: Individual Therapy  Misty Pacini, OTS 05/06/2024, 7:50 AM

## 2024-05-06 NOTE — Progress Notes (Signed)
 "                                                        PROGRESS NOTE   Subjective/Complaints:  No events overnight. Vitals stable with some very mild intermittent bradycardia down to 56. Patient seen at bedside with his wife who is here for family training.  They expressed concern about an episode during therapies today where they were talking to the patient and he was intelligible, but when therapist entered the room he suddenly became very slurred and nonsensical for about 10 to 15 seconds.  Patient cannot describe any sensations during this other than he felt off.  No loss of consciousness or increased lethargy afterwards.  Symptoms resolved and he was taken to therapy gym, where he appreciated some increased coordination difficulties and weakness in his right upper and lower extremity, and he was brought back to the room to rest.  Symptoms have not recurred.  BMP done earlier today was unchanged from priors, but patient does endorse poor fluid intake send agrees to work on this.  ROS: as per HPI. Denies CP, SOB, abd pain, N/V/D/C, or any other complaints at this time.   + Right upper and lower extremity aching--ongoing  Objective:   No results found.  Recent Labs    05/04/24 0455  WBC 5.5  HGB 13.4  HCT 39.5  PLT 253    Recent Labs    05/04/24 0455 05/06/24 1159  NA 140 138  K 4.1 3.9  CL 102 102  CO2 30 26  GLUCOSE 115* 131*  BUN 13 11  CREATININE 1.06 1.09  CALCIUM  9.4 9.5     Intake/Output Summary (Last 24 hours) at 05/06/2024 1745 Last data filed at 05/06/2024 1602 Gross per 24 hour  Intake 240 ml  Output 1300 ml  Net -1060 ml        Physical Exam: Vital Signs Blood pressure 123/64, pulse (!) 54, temperature 98.6 F (37 C), temperature source Oral, resp. rate 17, height 5' 7 (1.702 m), weight 95.7 kg, SpO2 100%.  Physical Exam  Constitutional: No apparent distress. Appropriate appearance for age.  Sitting up in wheelchair. HENT: Status post left  basal craniotomy, stable Eyes: PERRLA.  EOMI.  No obvious nystagmus. Cardiovascular: RRR, no murmurs/rub/gallops. No Edema. Peripheral pulses 2+  Respiratory: CTAB. No rales, rhonchi, or wheezing. On RA.  Abdomen: + bowel sounds, normoactive. No distention or tenderness.  Skin: Posterior surgical site well-approximated and healing--no swelling, erythema, or fluctuance.   MSK:      No apparent deformity.        No current tenderness to palpation of right lower extremity  Neurologic exam:  Cognition: AAO to person, place, time and event.  + Slight cognitive delay--ongoing, consistent with prior exams  Language: Moderate dysarthria, with some speech delay --actually improved today Insight: Fair insight into current condition.  Mood: Pleasant affect, appropriate mood.  Sensation: Reduced to light touch throughout the right hemibody Reflexes: 2+ in BL UE and LEs. Negative Hoffman's and babinski signs bilaterally.  CN:  +facial droop.   Coordination: Right upper and lower extremity ataxia, complicated by weakness Spasticity: MAS 1 right wrist flexors, finger flexors, elbow flexors, plantar flexors; MAS 2 right knee flexor       Strength:  RUE: 3+/5 SA, 3+/5 EF, 3+/5 EE, 4/5 WE, 4+/5 FF, 4/5 FA--improving                LUE:  5/5 SA, 5/5 EF, 5/5 EE, 5/5 WE, 5/5 FF, 5/5 FA                RLE: 3/5 HF, 4-/5 KE, 0/5  DF, 0/5  EHL, 4/5  PF                LLE:  5/5 HF, 5/5 KE, 5/5  DF, 5/5  EHL, 5/5  PF   Physical exam unchanged from the above on reexamination 05/06/24    Assessment/Plan: 1. Functional deficits which require 3+ hours per day of interdisciplinary therapy in a comprehensive inpatient rehab setting. Physiatrist is providing close team supervision and 24 hour management of active medical problems listed below. Physiatrist and rehab team continue to assess barriers to discharge/monitor patient progress toward functional and medical goals  Care Tool:  Bathing     Body parts bathed by patient: Right arm, Chest, Abdomen, Front perineal area, Right upper leg, Left upper leg, Face   Body parts bathed by helper: Left arm, Right lower leg, Left lower leg, Buttocks     Bathing assist Assist Level: Moderate Assistance - Patient 50 - 74%     Upper Body Dressing/Undressing Upper body dressing   What is the patient wearing?: Pull over shirt    Upper body assist Assist Level: Minimal Assistance - Patient > 75%    Lower Body Dressing/Undressing Lower body dressing      What is the patient wearing?: Pants     Lower body assist Assist for lower body dressing: 2 Helpers     Toileting Toileting    Toileting assist Assist for toileting: 2 Helpers     Transfers Chair/bed transfer  Transfers assist     Chair/bed transfer assist level: Moderate Assistance - Patient 50 - 74%     Locomotion Ambulation   Ambulation assist      Assist level: 2 helpers Assistive device: Other (comment) (hand rail) Max distance: 30   Walk 10 feet activity   Assist     Assist level: 2 helpers Assistive device: Other (comment) (hand rail)   Walk 50 feet activity   Assist Walk 50 feet with 2 turns activity did not occur: Safety/medical concerns         Walk 150 feet activity   Assist Walk 150 feet activity did not occur: Safety/medical concerns         Walk 10 feet on uneven surface  activity   Assist Walk 10 feet on uneven surfaces activity did not occur: Safety/medical concerns         Wheelchair     Assist Is the patient using a wheelchair?: Yes Type of Wheelchair: Manual    Wheelchair assist level: Maximal Assistance - Patient 25 - 49% Max wheelchair distance: 50'    Wheelchair 50 feet with 2 turns activity    Assist        Assist Level: Maximal Assistance - Patient 25 - 49%   Wheelchair 150 feet activity     Assist      Assist Level: Total Assistance - Patient < 25%   Blood pressure 123/64,  pulse (!) 54, temperature 98.6 F (37 C), temperature source Oral, resp. rate 17, height 5' 7 (1.702 m), weight 95.7 kg, SpO2 100%.    Medical Problem List and Plan: 1. 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 Janjua             -patient may  shower with shower cap             -ELOS/Goals: 14-16d -CGA to Min A goals -DC date 05/12/24--> 05/15/24  - Stable to continue inpatient rehab  - 1/13: Going home with 2 daughters, one is CNA. Min to Mod A UB and Max A LB ADLs; poor body orientation. Other evals pending.   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  He is understanding   - 1/20: Min A transfers and walker 130 ft yesterday; Mod- Max A today handheld assist due to fatigue. Min-Mod A UB care, Mod A LB care due to poor midline orientation and balance. Mild dysarthria and aprosity. Some attention/reasoning deficits c/b personality.   - 1-23: Discussed discharge date with patient, wife.  They have considerable concerns that he is not yet ambulatory, did discuss that he is expected to need some assistance at discharge and independent ambulation is not expected.  Answered all questions.  - 1/27: Family education tomorrow. Family very hesitant to bring him home without being functionally independent. SPV bed mobility, Min A RW, walked 540 ft Min-Mod A with RW yesterday - with significant loss of balance. Improving with midline orientation and lateropulsion. Setup-Min A UB care; doing better with standing, shower with CGA for standing balance today. RUE improving. SPV-Min A goals. Mild dysarthria but at goal level Mod I for communication. Mild cog deficits with complex  attention c/b mental inflexibility - SPV, goal Mod I.   - Likely custom AFO needed closer to discharge--ordered 1/27 for eval later this week  - 1-28: A few episodes today of dysarthria and increased right sided weakness/coordination difficulty, with stable BMP and vitals, without other concerning features for seizure.  Discussed with Dr. [name], will repeat CT head to evaluate for hydrocephalus; read pending at this time but no obvious ventricular dilation or new bleeding on personal exam.  Management as below.    2.  Antithrombotics: -DVT/anticoagulation:  Pharmaceutical: Heparin  5kU BID initiated 04/14/2024             -antiplatelet therapy: N/A  1-21: Duplex right lower extremity negative  3. Pain Management: Tylenol  as needed  -1-13 discussed positioning off of surgical site at nighttime.  Pain well-controlled on current regimen. - 1-14: Voltaren  gel 4 times daily to right ankle and calf.  Discussed bracing with therapies.  Add baclofen  5 mg 3 times daily for developing tightness/muscle aching on the right hemibody.  Consider right hip x-ray for arthritis if groin pain becomes worse.  1/15: Pain much improved with current regimen, monitor 1-17: Occipital nerve distribution of odd sensations across his head, not painful, does not want additional treatments at this time  -1/19 Will order Vas US  r/o DVT but think it is less likely--ultrasound negative  1-20: Increase baclofen  to 10 mg 3 times daily--improved pain tolerance  1-21: Increase nightly gabapentin  to 600 mg for calf and hand pain 1-22: Oversedated with current regimen; dropped gabapentin  back to 300 nightly, reduce baclofen  to 5 mg 3 times daily, add Robaxin  500 mg every 8 hours as needed for  spasms--sedation improved 1-23 1-26: Still feeling cognitively slow, stop nightly gabapentin --no changes noted per patient 1-27 1-27: Daily AM headache, responsive to Tylenol , patient will be asking his wife to bring in pillows from home for  better neck support.  1-28: If workup negative for event today, may need to DC baclofen  due to possibility of orthostatic hypotension.  He has been tolerating this well prior to this, so low index of suspicion.  He remains with significant soreness and pain in his right upper and lower extremity that benefits from this medication.  4. Mood/Behavior/Sleep: Provide emotional support             -antipsychotic agents: N/A   - sleeping well, apporpriate  5. Neuropsych/cognition: This patient is capable of making decisions on his own behalf.  - 1-27: Discussed cognitive deficits with patient, difficulty with attention.  Discussed starting neurostimulant to help with this; patient declines at this time.  1-28: Episode of confusion today, self-limited.  CT head read pending as above.  BMP stable.  Will check LFTs, ammonia, and CBC in AM.  Encourage p.o. fluid intake, repeating orthostatic vitals.  6. Skin/Wound Care: Routine skin checks - 1-14:   will reach out to Dr. [name] regarding DC staples POD 10 tomorrow--OK, order placed 1/15 1-16: Some uneven edges on the surgical line in the lower portion, reinforced with Steri-Strips today.  No drainage or open wound bed, no concern for dehiscence or infection--looking much better  1-27: Skin site looking good, can wait for Steri-Strips to follow-up for removal if needed later this week  7. Fluids/Electrolytes/Nutrition: Routine and analysis with follow-up chemistries, continue vitamins/supplements   - labs stable on admission; follow up  8.  Diabetes mellitus.  Hemoglobin A1c 7.2.  Glucophage  500 mg twice daily, Lantus  insulin  8 units nightly - 1-18: Blood sugars well-controlled, have not resumed home Lantus .  Continue as needed insulin , using rarely, may be able to reduce blood glucose checks this week -1/19 well-controlled continue current regimen and monitor -1-23: Tolerating metformin , rare use of insulin  staying below 2 units.  Will monitor through  the weekend  -05/02/24 CBGs looking great, would consider cutting back to BID checks if trend continues 1/26: No elevations >120; DC BG checks and insulin     9.  Hyperlipidemia.  Crestor  20mg  nightly  10.  Hypertension.  Norvasc  5 mg daily, Atenolol  50 mg daily, monitor with increased mobility -Blood pressure, vital stable.  Vitals:   05/03/24 0844 05/03/24 1435 05/03/24 2020 05/04/24 0429  BP: 116/61 (!) 115/55 124/69 111/65   05/04/24 0813 05/04/24 1331 05/04/24 1924 05/05/24 0453  BP: 114/69 (!) 113/59 123/77 120/65   05/05/24 1302 05/05/24 1939 05/06/24 0530 05/06/24 1550  BP: 131/77 130/74 122/71 123/64    11.  Class II obesity.  BMI 35.29.  Dietary follow-up  12.  Constipation/IBS.  MiraLAX  daily, Senokot 1 tablet nightly, colace 100mg  BID   - 1/12 LBM--may need laxatives increase tomorrow if no improvement  -1/25 LBM, medium  13.  COPD.  History of tobacco use.  Monitor oxygen saturations.  Discussed no nicotine. 1-20: Patient to bring in portable CPAP machine from home; nursing informed to assess and set up--pending  14. Urinary urgency. Wean off purwick.    - no incontinence, doing well  15. Left vision deficits.  Continue glasses taping, therapies.  - 1-17:  resolved today.  16.  Gastric reflux.  Resume Pepcid  40 mg daily, Protonix  40mg  nightly, Tums 3 times daily as needed   -Symptoms  well-controlled on current regimen  17.  Stage IIIa CKD.  Baseline creatinine appears 1.1-1.4  -  Some fluctuations, but at baseline.  1-28: BMP stable, encouraging p.o. fluids   LOS: 16 days A FACE TO FACE EVALUATION WAS PERFORMED  Larry Dawson Likes 05/06/2024, 5:45 PM     "

## 2024-05-06 NOTE — Progress Notes (Signed)
 Per family at bedside with some concerns of patient's p.o. intake concerning fluids.  Latest BM at 05/04/2024 unremarkable.  Family as well as patient had requested follow-up chemistries in the a.m.

## 2024-05-06 NOTE — Progress Notes (Signed)
 Physical Therapy Session Note  Patient Details  Name: Larry Dawson MRN: [id number] Date of Birth: 1955-04-14  Today's Date: 05/06/2024 PT Individual Time: 1105-1200 PT Individual Time Calculation (min): 55 min   Today's Date: 05/06/2024 PT Individual Time: 8551-8470 PT Individual Time Calculation (min): 41 min   Short Term Goals: Week 2:  PT Short Term Goal 1 (Week 2): Pt will complete transfers consistently with minA and LRAD PT Short Term Goal 2 (Week 2): Pt will complete gait 150' with min assist and LRAD PT Short Term Goal 3 (Week 2): Pt will complete up/down 4 steps with mod assist  Skilled Therapeutic Interventions/Progress Updates:     1st Session: PT received seated in [facility] and agrees to therapy. Family present for family education. PT provides update on pt's mobility status, as well as recommendations for safe mobility following discharge. Pt begins speaking to therapist and demonstrates increased difficulty with word finding, and garbled language. Dan, PA, alerted and arrives to assess pt. Pt does not demonstrate any focal deficits and language seems to improve. WC transport to gym. Pt performs sit to stand with minA and ambulates x100' with RW and minA primarily, with one instance of modA due to LOB when turning, with RLE adducting and narrowing base of support, with LOB to the Rt. Following rest break, pt ambulates x40' with daughter providing assistance and PT providing cues safe guarding technique. Following rest break, pt completes x4 6 steps with bilateral handrails and modA due to consistent posterior lean. WC transport back to room. Pt left seated in WC with alarm intact and all needs within reach.   2nd Session: Pt received supine in bed and agrees to therapy. No complaint of pain. Supine to sit with increased time required and minA provided to scoot Rt hip toward EOB. PT assists to don both dhoes and Rt AFO. Pt performs stand step transfer from bed to [facility] with modA/maxA and  decreased control, losing balance posteriorly and to the Rt and sitting abruptly into WC. WC transport to gym. PT educates on body mechanics and safety, and pt performs stand step to mat table with modA and improved safety and control. Pt performs standing activity for NMR and Rt hemibody weight shifting. Pt performs x10 step ups with modA/maxA and PT positioned under RUE, with mirror provided for visual feedback. Pt takes extended seated rest break and then completes additional x20 with PT under RUE and tech providing Lt HHA. Pt has improved performance and body mechanics with +2. Pt performs final bout of x20 alternating foot taps to progress difficulty, with cues for posture, upright gaze, foot placement, weight shifting, and engagement of core and hip extensors. WC transport back to room. Left seated with all needs within reach.   Therapy Documentation Precautions:  Precautions Precautions: Fall Recall of Precautions/Restrictions: Impaired Precaution/Restrictions Comments: R-hemiparesis/hemiplegia Restrictions Weight Bearing Restrictions Per Provider Order: No    Therapy/Group: Individual Therapy  [name], PT, DPT 05/06/2024, 3:30 PM

## 2024-05-07 DIAGNOSIS — D1800 Hemangioma unspecified site: Secondary | ICD-10-CM | POA: Diagnosis not present

## 2024-05-07 LAB — CBC
HCT: 37.9 % — ABNORMAL LOW (ref 39.0–52.0)
Hemoglobin: 12.7 g/dL — ABNORMAL LOW (ref 13.0–17.0)
MCH: 28 pg (ref 26.0–34.0)
MCHC: 33.5 g/dL (ref 30.0–36.0)
MCV: 83.5 fL (ref 80.0–100.0)
Platelets: 219 10*3/uL (ref 150–400)
RBC: 4.54 MIL/uL (ref 4.22–5.81)
RDW: 13.2 % (ref 11.5–15.5)
WBC: 5.4 10*3/uL (ref 4.0–10.5)
nRBC: 0 % (ref 0.0–0.2)

## 2024-05-07 LAB — BASIC METABOLIC PANEL WITH GFR
Anion gap: 10 (ref 5–15)
BUN: 13 mg/dL (ref 8–23)
CO2: 25 mmol/L (ref 22–32)
Calcium: 9.3 mg/dL (ref 8.9–10.3)
Chloride: 104 mmol/L (ref 98–111)
Creatinine, Ser: 1.08 mg/dL (ref 0.61–1.24)
GFR, Estimated: >60 mL/min
Glucose, Bld: 118 mg/dL — ABNORMAL HIGH (ref 70–99)
Potassium: 4 mmol/L (ref 3.5–5.1)
Sodium: 138 mmol/L (ref 135–145)

## 2024-05-07 LAB — AMMONIA: Ammonia: 14 umol/L (ref 9–35)

## 2024-05-07 LAB — HEPATIC FUNCTION PANEL
ALT: 40 U/L (ref 0–44)
AST: 28 U/L (ref 15–41)
Albumin: 3.6 g/dL (ref 3.5–5.0)
Alkaline Phosphatase: 56 U/L (ref 38–126)
Bilirubin, Direct: 0.3 mg/dL — ABNORMAL HIGH (ref 0.0–0.2)
Indirect Bilirubin: 0.5 mg/dL (ref 0.3–0.9)
Total Bilirubin: 0.8 mg/dL (ref 0.0–1.2)
Total Protein: 6.5 g/dL (ref 6.5–8.1)

## 2024-05-07 MED ORDER — MAGNESIUM GLUCONATE 500 (27 MG) MG PO TABS
250.0000 mg | ORAL_TABLET | Freq: Every day | ORAL | Status: DC
  Administered 2024-05-07: 250 mg via ORAL
  Filled 2024-05-07: qty 1

## 2024-05-07 MED ORDER — SENNOSIDES-DOCUSATE SODIUM 8.6-50 MG PO TABS
2.0000 | ORAL_TABLET | Freq: Two times a day (BID) | ORAL | Status: DC
  Administered 2024-05-07 – 2024-05-15 (×15): 2 via ORAL
  Filled 2024-05-07 (×16): qty 2

## 2024-05-07 NOTE — Progress Notes (Signed)
 Physical Therapy Weekly Progress Note  Patient Details  Name: Larry Dawson MRN: [id number] Date of Birth: May 16, 1955  Beginning of progress report period: April 28, 2024 End of progress report period: May 07, 2024  Today's Date: 05/07/2024 PT Individual Time: 1108-1207 + 1306-1400 PT Individual Time Calculation (min): 59 min + 54 min  Patient has met 2 of 3 short term goals.  Pt is making good progress towards functional goals. Pt completes bed mobility with supervision, completes transfers with min assist with RW. Pt is ambulating >150' with min assist with RW and R hemi splint. Pt is able to complete 4 steps with min/mod assist, BHRs, and 2nd person stand by assist. Pt has received AFO consult and family education completed initially with wife. Pt and pt wife would benefit from second family ed closer to DC to more adequately depict pt physical function for home.  Patient continues to demonstrate the following deficits muscle weakness, decreased cardiorespiratoy endurance, impaired timing and sequencing, abnormal tone, unbalanced muscle activation, and decreased coordination, decreased midline orientation and decreased attention to right, and decreased standing balance, decreased postural control, hemiplegia, and decreased balance strategies and therefore will continue to benefit from skilled PT intervention to increase functional independence with mobility.  Patient progressing toward long term goals..  Continue plan of care.  PT Short Term Goals Week 2:  PT Short Term Goal 1 (Week 2): Pt will complete transfers consistently with minA and LRAD PT Short Term Goal 1 - Progress (Week 2): Met PT Short Term Goal 2 (Week 2): Pt will complete gait 150' with min assist and LRAD PT Short Term Goal 2 - Progress (Week 2): Met PT Short Term Goal 3 (Week 2): Pt will complete up/down 4 steps with mod assist PT Short Term Goal 3 - Progress (Week 2): Progressing toward goal Week 3:  PT Short Term  Goal 1 (Week 3): STG = LTG due to ELOS  Skilled Therapeutic Interventions/Progress Updates:  SESSION 1: Pt presents in room in [facility], agreeable to PT. Pt does not report pain at start of session, states that he had pain in ankle and calf this morning but improved with Tylenol . Session focused on NMR for dynamic standing balance, BLE coordination and gait training for upright tolerance and gait mechanics with RW. Pt transported to main gym via [facility] for time management. Pt completes stand step transfer with RW with min assist to EOM. Pt ambulates 95' with RW with min assist and gait training with cues for stepping wide with RLE, pt able to correct gait and midline orientation with only min assist and min cues. Pt then completes NMR in //bars including: - side stepping 3x8' - resisted side stepping yellow tband 3x8' - resisted forward/backward stepping 3x8' - walking while stepping on colored dots placed shoulder width apart forward/backward 3x8' Pt takes rest breaks between exercises and gait training for energy conservation and to improve quality with tasks. Pt then ambulates back to room with cues for gait mechanics ~200' with min assist, slow gait speed noted with decreased L step length compared to previous sessions. Pt returns to room and remains seated in [facility] with all needs within reach, cal light in place and chair alarm donned and activated at end of session.    SESSION 2: Pt presents in room in [facility], agreeable to PT. Pt does not report pain during session. Session focused on NMR for dynamic standing balance and BLE coordination as well as therapeutic activities for AFO consult for RLE  with orthotist from [facility] present. Pt transported to day room dependently via [facility] for time management. Pt completes sit to stand and stand pivot transfer without device with mod assist for increased balance challenge. Pt then completes NMR without UE support to promote dynamic standing balance, single limb stability, and  BLE coordination including: - side stepping 3x6' mod assist for postural stability cues for technique - step taps 4 step 2x10 BLE  - interval training standing stepping 30 sec work/30 sec rest for 10 min total on resistance 35 cm/sec (pt uses BUE support on kinetron for stability during exercise) Pt ambulates without device for ambulatory transfer to/from kinetron with min/mod assist. Orthotist Olivia from [facility] presents during session to consult for AFO for pt RLE. Pt completes gait 2x30' with RW, first trial with PLS AFO 2nd trial without AFO. Pt noted to demonstrate increased R foot ER with PLS, orthotist recommends trialing thuasne AFO which she states she will bring for trial. Pt remains seated in WC in day room for dance group at end of session.      Therapy Documentation Precautions:  Precautions Precautions: Fall Recall of Precautions/Restrictions: Impaired Precaution/Restrictions Comments: R-hemiparesis/hemiplegia Restrictions Weight Bearing Restrictions Per Provider Order: No    Therapy/Group: Individual Therapy  Reche Ohara PT, DPT 05/07/2024, 4:41 PM

## 2024-05-07 NOTE — Plan of Care (Signed)
 Patient had no acute events overnight.      Problem: Consults Goal: RH STROKE PATIENT EDUCATION Description: See Patient Education module for education specifics  Outcome: Progressing   Problem: RH BOWEL ELIMINATION Goal: RH STG MANAGE BOWEL WITH ASSISTANCE Description: STG Manage Bowel with supervision Assistance. Outcome: Progressing   Problem: RH BLADDER ELIMINATION Goal: RH STG MANAGE BLADDER WITH ASSISTANCE Description: STG Manage Bladder With supervision  Assistance Outcome: Progressing   Problem: RH SKIN INTEGRITY Goal: RH STG SKIN FREE OF INFECTION/BREAKDOWN Description: Skin free of infection with supervision assistance Outcome: Progressing   Problem: RH SAFETY Goal: RH STG ADHERE TO SAFETY PRECAUTIONS W/ASSISTANCE/DEVICE Description: STG Adhere to Safety Precautions With supervision Assistance/Device. Outcome: Progressing   Problem: RH PAIN MANAGEMENT Goal: RH STG PAIN MANAGED AT OR BELOW PT'S PAIN GOAL Description: <4 w/ prn Outcome: Progressing   Problem: RH KNOWLEDGE DEFICIT Goal: RH STG INCREASE KNOWLEDGE OF DIABETES Description: Manage increase knowledge of diabetes with supervision assistance from wife using educational materials provided  Outcome: Progressing Goal: RH STG INCREASE KNOWLEDGE OF HYPERTENSION Description: Manage increase knowledge of hypertension with supervision assistance from wife using educational materials provided  Outcome: Progressing

## 2024-05-07 NOTE — Progress Notes (Addendum)
 "                                                        PROGRESS NOTE   Subjective/Complaints:  No events overnight. Vitals stable along with negative orthostats yesterday. PO intakes 0-25% over the last day, PO fluid only 476 yesterday  Patient states he still feels a step slow from his normal status today.  He did very well with a.m. therapies with no issues.  He is having some increased pain in his right ankle, especially at nighttime, which he is unable to describe.  ROS: as per HPI. Denies CP, SOB, abd pain, N/V/D/C, or any other complaints at this time.   + Right upper and lower extremity aching--ongoing + Right ankle pain, new  Objective:   CT HEAD WO CONTRAST ( ) Result Date: 05/06/2024 CLINICAL DATA:  Initial evaluation for acute altered mental status. EXAM: CT HEAD WITHOUT CONTRAST TECHNIQUE: Contiguous axial images were obtained from the base of the skull through the vertex without intravenous contrast. RADIATION DOSE REDUCTION: This exam was performed according to the departmental dose-optimization program which includes automated exposure control, adjustment of the mA and/or kV according to patient size and/or use of iterative reconstruction technique. COMPARISON:  Prior MRI from 04/14/2024. 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 Trace residual subacute hemorrhage present at this location (series 2, image 8). No evidence for new hemorrhage. Basilar cisterns remain patent as does the adjacent fourth ventricle. Small low-density subdural collection overlying the left cerebral convexity most likely reflects a small subdural hygroma, new from prior. No significant mass effect or midline shift. No other acute intracranial hemorrhage. No acute large vessel territory infarct. No mass lesion  or midline shift. No hydrocephalus. Underlying atrophy with moderately advanced chronic microvascular ischemic disease again noted, stable. Vascular: No abnormal hyperdense vessel. Calcified atherosclerosis present at skull base. Skull: Prior left suboccipital craniotomy without adverse features. No acute scalp soft tissue abnormality. Calvarium otherwise intact. Sinuses/Orbits: Globes orbital soft tissues within normal limits. Changes of chronic right sphenoid sinusitis. Small left mastoid effusion noted. Other: None. IMPRESSION: 1. 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 Trace residual subacute hemorrhage at this location. No evidence for new hemorrhage. 2. Small low-density subdural collection overlying the left cerebral convexity, new from prior, and most likely reflecting a small subdural hygroma. No significant mass effect or midline shift. 3. No other acute intracranial abnormality. 4. Underlying atrophy with moderately advanced chronic microvascular ischemic disease. Electronically Signed   By: Morene Hoard M.D.   On: 05/06/2024 19:16    Recent Labs    05/07/24 0510  WBC 5.4  HGB 12.7*  HCT 37.9*  PLT 219    Recent Labs    05/06/24 1159 05/07/24 0510  NA 138 138  K 3.9 4.0  CL 102 104  CO2 26 25  GLUCOSE 131* 118*  BUN 11 13  CREATININE 1.09 1.08  CALCIUM  9.5 9.3     Intake/Output Summary (Last 24 hours) at 05/07/2024 0932 Last data filed at 05/07/2024 0920 Gross per 24 hour  Intake 476 ml  Output 500 ml  Net -24 ml  Physical Exam: Vital Signs Blood pressure 122/67, pulse 62, temperature 98.6 F (37 C), temperature source Oral, resp. rate 19, height 5' 7 (1.702 m), weight 95.7 kg, SpO2 99%.  Physical Exam  Constitutional: No apparent distress. Appropriate appearance for age.  Sitting in therapy gym. HENT: Status post left basal craniotomy, stable Eyes: PERRLA.   EOMI.  No obvious nystagmus. Cardiovascular: RRR, no murmurs/rub/gallops. No Edema. Peripheral pulses 2+  Respiratory: CTAB. No rales, rhonchi, or wheezing. On RA.  Abdomen: + bowel sounds, normoactive. No distention or tenderness.  Skin: Posterior surgical site well-approximated and healing--no swelling, erythema, or fluctuance.   MSK:      No apparent deformity.        + TTP over right anterior lateral ankle, without obvious swelling.  Slight foot inversion positioning.  Neurologic exam:  Cognition: AAO to person, place, time and event.  + Slight cognitive delay--ongoing, consistent with prior exams  Language: Moderate dysarthria, with some speech delay --improving gradually Insight: Fair insight into current condition.  Mood: Pleasant affect, appropriate mood.  Sensation: Reduced to light touch throughout the right hemibody CN:  + Right facial droop.   Coordination: Right upper and lower extremity ataxia, complicated by weakness Spasticity: MAS 1 right wrist flexors, finger flexors, elbow flexors, plantar flexors; MAS 0 right knee flexor       Strength:                RUE: 3+/5 SA, 3+/5 EF, 3+/5 EE, 4/5 WE, 4+/5 FF, 4/5 FA--improving                LUE:  5/5 SA, 5/5 EF, 5/5 EE, 5/5 WE, 5/5 FF, 5/5 FA                RLE: 3/5 HF, 4-/5 KE, 3/5  DF, 4/5  EHL, 4/5  PF--getting more DF back!                LLE:  5/5 HF, 5/5 KE, 5/5  DF, 5/5  EHL, 5/5  PF    Assessment/Plan: 1. Functional deficits which require 3+ hours per day of interdisciplinary therapy in a comprehensive inpatient rehab setting. Physiatrist is providing close team supervision and 24 hour management of active medical problems listed below. Physiatrist and rehab team continue to assess barriers to discharge/monitor patient progress toward functional and medical goals  Care Tool:  Bathing    Body parts bathed by patient: Right arm, Chest, Abdomen, Front perineal area, Right upper leg, Left upper leg, Face   Body  parts bathed by helper: Left arm, Right lower leg, Left lower leg, Buttocks     Bathing assist Assist Level: Moderate Assistance - Patient 50 - 74%     Upper Body Dressing/Undressing Upper body dressing   What is the patient wearing?: Pull over shirt    Upper body assist Assist Level: Minimal Assistance - Patient > 75%    Lower Body Dressing/Undressing Lower body dressing      What is the patient wearing?: Pants     Lower body assist Assist for lower body dressing: 2 Helpers     Toileting Toileting    Toileting assist Assist for toileting: 2 Helpers     Transfers Chair/bed transfer  Transfers assist     Chair/bed transfer assist level: Moderate Assistance - Patient 50 - 74%     Locomotion Ambulation   Ambulation assist      Assist level: 2 helpers Assistive device: Other (comment) (hand rail) Max  distance: 30   Walk 10 feet activity   Assist     Assist level: 2 helpers Assistive device: Other (comment) (hand rail)   Walk 50 feet activity   Assist Walk 50 feet with 2 turns activity did not occur: Safety/medical concerns         Walk 150 feet activity   Assist Walk 150 feet activity did not occur: Safety/medical concerns         Walk 10 feet on uneven surface  activity   Assist Walk 10 feet on uneven surfaces activity did not occur: Safety/medical concerns         Wheelchair     Assist Is the patient using a wheelchair?: Yes Type of Wheelchair: Manual    Wheelchair assist level: Maximal Assistance - Patient 25 - 49% Max wheelchair distance: 66'    Wheelchair 50 feet with 2 turns activity    Assist        Assist Level: Maximal Assistance - Patient 25 - 49%   Wheelchair 150 feet activity     Assist      Assist Level: Total Assistance - Patient < 25%   Blood pressure 122/67, pulse 62, temperature 98.6 F (37 C), temperature source Oral, resp. rate 19, height 5' 7 (1.702 m), weight 95.7 kg, SpO2  99%.    Medical Problem List and Plan: 1. 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 Janjua             -patient may  shower with shower cap             -ELOS/Goals: 14-16d -CGA to Min A goals -DC date 05/12/24--> 05/15/24  - Stable to continue inpatient rehab  - 1/13: Going home with 2 daughters, one is CNA. Min to Mod A UB and Max A LB ADLs; poor body orientation. Other evals pending.   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  He is understanding   - 1/20: Min A transfers and walker 130 ft yesterday; Mod- Max A today handheld assist due to fatigue. Min-Mod A UB care, Mod A LB care due to poor midline orientation and balance. Mild dysarthria and aprosity. Some attention/reasoning deficits c/b personality.   - 1-23: Discussed discharge date with patient, wife.  They have considerable concerns that he is not yet ambulatory, did discuss that he is expected to need some assistance at discharge and independent ambulation is not expected.  Answered all questions.  - 1/27: Family education tomorrow. Family very hesitant to bring him home without being functionally independent. SPV bed mobility, Min A RW, walked 540 ft Min-Mod A with RW yesterday - with significant loss of balance. Improving with midline orientation and lateropulsion. Setup-Min A UB care; doing better with standing, shower with CGA for standing balance today. RUE improving. SPV-Min A goals. Mild dysarthria but at goal level Mod I for communication. Mild cog deficits with complex attention c/b mental inflexibility - SPV, goal Mod I.   - Likely custom AFO needed closer to discharge--ordered 1/27 for eval  later this week  - 1-28: A few episodes today of dysarthria and increased right sided weakness/coordination difficulty, with stable BMP and vitals, without other concerning  features for seizure.  Discussed with Dr. [name], repeat CT head with small hygroma but no hydrocephalus or obstruction.  Orthostatic vital stable, labs stable.  Monitor closely.    2.  Antithrombotics: -DVT/anticoagulation:  Pharmaceutical: Heparin  5kU BID initiated 04/14/2024             -antiplatelet therapy: N/A  1-21: Duplex right lower extremity negative  3. Pain Management/?complex migraines: Tylenol  as needed  -1-13 discussed positioning off of surgical site at nighttime.  Pain well-controlled on current regimen. - 1-14: Voltaren  gel 4 times daily to right ankle and calf.  Discussed bracing with therapies.  Add baclofen  5 mg 3 times daily for developing tightness/muscle aching on the right hemibody.  Consider right hip x-ray for arthritis if groin pain becomes worse.  1/15: Pain much improved with current regimen, monitor 1-17: Occipital nerve distribution of odd sensations across his head, not painful, does not want additional treatments at this time  -1/19 Will order Vas US  r/o DVT but think it is less likely--ultrasound negative  1-20: Increase baclofen  to 10 mg 3 times daily--improved pain tolerance  1-21: Increase nightly gabapentin  to 600 mg for calf and hand pain 1-22: Oversedated with current regimen; dropped gabapentin  back to 300 nightly, reduce baclofen  to 5 mg 3 times daily, add Robaxin  500 mg every 8 hours as needed for spasms--sedation improved 1-23 1-26: Still feeling cognitively slow, stop nightly gabapentin --no changes noted per patient 1-27 1-27: Daily AM headache, responsive to Tylenol , patient will be asking his wife to bring in pillows from home for better neck support.  1-28: Workup negative for etiologies of patient's lethargy/confusion; DC baclofen  and recheck this afternoon.  Note patient has  been getting Voltaren  to right ankle and calf since 1-14, with minimal benefit; DC.  Add magnesium  supplement to 50 mg nightly for cramping and repeat magnesium  levels in AM.  4. Mood/Behavior/Sleep: Provide emotional support             -antipsychotic agents: N/A   - sleeping well, apporpriate  5. Neuropsych/cognition: This patient is capable of making decisions on his own behalf.  - 1-27: Discussed cognitive deficits with patient, difficulty with attention.  Discussed starting neurostimulant to help with this; patient declines at this time.  1-28: Episode of confusion today, self-limited.  CT head read pending as above.  BMP stable.  Will check LFTs, ammonia, and CBC in AM.  Encourage p.o. fluid intake, repeating orthostatic vitals.  1-29: CT head as above, workup grossly negative.  Patient feeling slightly better today but still a little off.  DC baclofen , follow-up with patient this afternoon.  6. Skin/Wound Care: Routine skin checks - 1-14:   will reach out to Dr. [name] regarding DC staples POD 10 tomorrow--OK, order placed 1/15 1-16: Some uneven edges on the surgical line in the lower portion, reinforced with Steri-Strips today.  No drainage or open wound bed, no concern for dehiscence or infection--looking much better  1-27: Skin site looking good, can wait for Steri-Strips to follow-up for removal if needed later this week  7. Fluids/Electrolytes/Nutrition: Routine and analysis with follow-up chemistries, continue vitamins/supplements   - labs stable on admission; follow up  1-29: P.o. intakes down over the last day or so as patient has not been feeling well.  Encouraged today.  Repeat labs in AM.  8.  Diabetes mellitus.  Hemoglobin A1c 7.2.  Glucophage  500 mg twice daily, Lantus  insulin  8 units nightly - 1-18: Blood sugars well-controlled, have not resumed home Lantus .  Continue as needed insulin , using rarely, may be able to reduce blood glucose checks this week -1/19 well-controlled  continue current regimen and monitor -1-23: Tolerating metformin , rare use of insulin  staying below 2 units.  Will monitor through the weekend  -05/02/24 CBGs looking great, would consider cutting back to BID checks if trend continues 1/26: No elevations >120; DC BG checks and insulin   - BG is well-controlled on labs since   9.  Hyperlipidemia.  Crestor  20mg  nightly  10.  Hypertension.  Norvasc  5 mg daily, Atenolol  50 mg daily, monitor with increased mobility -Blood pressure, vital stable.  Vitals:   05/03/24 2020 05/04/24 0429 05/04/24 0813 05/04/24 1331  BP: 124/69 111/65 114/69 (!) 113/59   05/04/24 1924 05/05/24 0453 05/05/24 1302 05/05/24 1939  BP: 123/77 120/65 131/77 130/74   05/06/24 0530 05/06/24 1550 05/06/24 1940 05/07/24 0551  BP: 122/71 123/64 125/65 122/67    11.  Class II obesity.  BMI 35.29.  Dietary follow-up  12.  Constipation/IBS.  MiraLAX  daily, Senokot 1 tablet nightly, colace 100mg  BID   - 1/12 LBM--may need laxatives increase tomorrow if no improvement  1/26 LBM, large  1-29: DC Colace, increase Senokot-S to 2 tabs twice daily  13.  COPD.  History of tobacco use.  Monitor oxygen saturations.  Discussed no nicotine. 1-20: Patient to bring in portable CPAP machine from home; nursing informed to assess and set up--pending  14. Urinary urgency. Wean off purwick.    - no incontinence, doing well  15. Left vision deficits.  Continue glasses taping, therapies.  - 1-17:  resolved today.  16.  Gastric reflux.  Resume Pepcid  40 mg daily, Protonix  40mg  nightly, Tums 3 times daily as needed   -Symptoms well-controlled on current regimen  17.  Stage IIIa CKD.  Baseline creatinine appears 1.1-1.4  -  Some fluctuations, but at baseline.  1-28/29: BMP stable, encouraging p.o. fluids   LOS: 17 days A FACE TO FACE EVALUATION WAS PERFORMED  Joesph JAYSON Likes 05/07/2024, 9:32 AM     "

## 2024-05-07 NOTE — Progress Notes (Signed)
 Occupational Therapy Session Note  Patient Details  Name: Larry Dawson MRN: [id number] Date of Birth: 03-Nov-1955  Today's Date: 05/07/2024 OT Individual Time: 0920-1000 OT Individual Time Calculation (min): 40 min   Today's Date: 05/07/2024 OT Individual Time: 1505-1530 OT Individual Time Calculation (min): 25 min   Short Term Goals: Week 3:  OT Short Term Goal 1 (Week 3): STGs=LTGs due to ELOS.  Skilled Therapeutic Interventions/Progress Updates:  Session 1: Pt greeted in [facility] for skilled OT session with focus on bed mobility and tub/shower transfers in preparation for d/c.   Pain: Pt with no reports of pain, but stated he still feels sluggish and off in reference to yesterday. OT offering intermediate rest breaks and positioning suggestions throughout session to address pain/fatigue and maximize participation/safety in session.   Functional Transfers: Pt completed stand-step transfers with CGA. Pt required Mod A while ambulating to return to midline after ~2 LOB.   Therapeutic Activities: Pt completed bed mobility in simulated home environment without assistance from bed features (bed rails, elevation). During 1st trial, pt required Min A to elevate RLE onto bed and CGA to return to sitting EOB. Pt was educated on moving legs off the bed and pushing up with LUE when returning to sitting EOB. Technique and means of transfer improved with repetitions and the above cuing.   Pt completed tub/shower transfer utilizing the TTB. Pt was educated on need to sit towards the back of the bench to prevent sliding forward, completed transfer with CGA during first trial. Encouragement was provided to reset transfer if experiencing difficulty for fall prevention. Second trial performed with supervision and use of grab bar.  Pt remained in [facility] with 4Ps assessed and immediate needs met. Pt continues to be appropriate for skilled OT intervention to promote further functional independence in ADLs/IADLs.    Session 2: Pt greeted in [facility] for skilled OT session with focus on RUE/RLE NMR.   Pain: Pt reported un-rated pain in groin and knee when in quadruped position. Pt reported muscle soreness in R shoulder when completing RUE exercises in supine. K-pad placed on shoulder at end of session. OT offering intermediate rest breaks and positioning suggestions throughout session to address pain/fatigue and maximize participation/safety in session.   Functional Transfers: Pt required Min A for stand-pivot transfer from WC<>EOM + no AD.   Therapeutic Exercise: Pt educated on the benefits of quadruped position in promoting WB through RUE/RLE for NMR. Pt required Mod A to transition from EOM<>quadruped, difficulty experienced with alignment, anticipate due to pain. Attempted to provide multimodal cues for trunk extension, forward weight shift onto BUE, but pt only able to tolerate position for ~2 min. Therefore, activity discontinued and pt transitioned to supine for therapeutic exercise. Pt performed 1x 10 chest press, shoulder flexion, and shoulder ab/adduction exercises. Mod A provided for tone management (elbow extension) and maintain grip on 2 lb dowel.  Pt remained in [facility] with 4Ps assessed and immediate needs met. Pt continues to be appropriate for skilled OT intervention to promote further functional independence in ADLs/IADLs.   Therapy Documentation Precautions:  Precautions Precautions: Fall Recall of Precautions/Restrictions: Impaired Precaution/Restrictions Comments: R-hemiparesis/hemiplegia Restrictions Weight Bearing Restrictions Per Provider Order: No   Therapy/Group: Individual Therapy  Misty Pacini, OTS 05/07/2024, 7:33 AM

## 2024-05-07 NOTE — Progress Notes (Addendum)
 Patient ID: Larry Dawson, male   DOB: Jun 15, 1955, 69 y.o.   MRN: [id number]   [id number] left message for pt wife to inform on d/c recs- Outpatient Pt/OT/SLP, and DME- RW ad TTB. SW requested return phone call to discus preference and if DME needs to be ordered.  *SW spoke with pt wife to discuss above. Preferred outpatient location is [facility] [address]  SW will order RW and wife states they have a TTB already.   Graeme Jude, MSW, LCSW Office: [phone] Cell: [phone] Fax: [phone]

## 2024-05-07 NOTE — Group Note (Signed)
 Patient Details Name: Larry Dawson MRN: [id number] DOB: 1955/10/14 Today's Date: 05/07/2024  Time Calculation: OT Group Time Calculation OT Group Start Time: 1400 OT Group Stop Time: 1500 OT Group Time Calculation (min): 60 min      Group Description: Dance Group: Pt participated in dance group with an emphasis on social interaction, motor planning, increasing overall activity tolerance and bimanual tasks. All songs were selected by group members. Dance moves included AROM of BUE/BLE gross motor movements with an emphasis on building functional endurance.   Individual level documentation: Patient completed group from sitting level. Patientt needed supervision to complete various dance moves with cues for safety.  Patient needed min modifications during group.  Pain:  0/10  Precautions:  Falls  [name] 05/07/2024, 3:26 PM

## 2024-05-07 NOTE — Plan of Care (Signed)
" °  Problem: Consults Goal: RH STROKE PATIENT EDUCATION Description: See Patient Education module for education specifics  Outcome: Progressing   Problem: RH BOWEL ELIMINATION Goal: RH STG MANAGE BOWEL WITH ASSISTANCE Description: STG Manage Bowel with supervision Assistance. Outcome: Progressing   Problem: RH BLADDER ELIMINATION Goal: RH STG MANAGE BLADDER WITH ASSISTANCE Description: STG Manage Bladder With supervision  Assistance Outcome: Progressing   Problem: RH SKIN INTEGRITY Goal: RH STG SKIN FREE OF INFECTION/BREAKDOWN Description: Skin free of infection with supervision assistance Outcome: Progressing   Problem: RH SAFETY Goal: RH STG ADHERE TO SAFETY PRECAUTIONS W/ASSISTANCE/DEVICE Description: STG Adhere to Safety Precautions With supervision Assistance/Device. Outcome: Progressing   Problem: RH PAIN MANAGEMENT Goal: RH STG PAIN MANAGED AT OR BELOW PT'S PAIN GOAL Description: <4 w/ prn Outcome: Progressing   Problem: RH KNOWLEDGE DEFICIT Goal: RH STG INCREASE KNOWLEDGE OF DIABETES Description: Manage increase knowledge of diabetes with supervision assistance from wife using educational materials provided  Outcome: Progressing Goal: RH STG INCREASE KNOWLEDGE OF HYPERTENSION Description: Manage increase knowledge of hypertension with supervision assistance from wife using educational materials provided  Outcome: Progressing   "

## 2024-05-08 DIAGNOSIS — D1800 Hemangioma unspecified site: Secondary | ICD-10-CM | POA: Diagnosis not present

## 2024-05-08 LAB — CBC WITH DIFFERENTIAL/PLATELET
Abs Immature Granulocytes: 0.01 10*3/uL (ref 0.00–0.07)
Basophils Absolute: 0.1 10*3/uL (ref 0.0–0.1)
Basophils Relative: 1 %
Eosinophils Absolute: 0.1 10*3/uL (ref 0.0–0.5)
Eosinophils Relative: 2 %
HCT: 38.8 % — ABNORMAL LOW (ref 39.0–52.0)
Hemoglobin: 13 g/dL (ref 13.0–17.0)
Immature Granulocytes: 0 %
Lymphocytes Relative: 37 %
Lymphs Abs: 2 10*3/uL (ref 0.7–4.0)
MCH: 28 pg (ref 26.0–34.0)
MCHC: 33.5 g/dL (ref 30.0–36.0)
MCV: 83.6 fL (ref 80.0–100.0)
Monocytes Absolute: 0.6 10*3/uL (ref 0.1–1.0)
Monocytes Relative: 11 %
Neutro Abs: 2.6 10*3/uL (ref 1.7–7.7)
Neutrophils Relative %: 49 %
Platelets: 234 10*3/uL (ref 150–400)
RBC: 4.64 MIL/uL (ref 4.22–5.81)
RDW: 13.2 % (ref 11.5–15.5)
WBC: 5.4 10*3/uL (ref 4.0–10.5)
nRBC: 0 % (ref 0.0–0.2)

## 2024-05-08 LAB — BASIC METABOLIC PANEL WITH GFR
Anion gap: 9 (ref 5–15)
BUN: 14 mg/dL (ref 8–23)
CO2: 27 mmol/L (ref 22–32)
Calcium: 9.3 mg/dL (ref 8.9–10.3)
Chloride: 103 mmol/L (ref 98–111)
Creatinine, Ser: 1 mg/dL (ref 0.61–1.24)
GFR, Estimated: >60 mL/min
Glucose, Bld: 111 mg/dL — ABNORMAL HIGH (ref 70–99)
Potassium: 3.9 mmol/L (ref 3.5–5.1)
Sodium: 140 mmol/L (ref 135–145)

## 2024-05-08 LAB — MAGNESIUM: Magnesium: 1.6 mg/dL — ABNORMAL LOW (ref 1.7–2.4)

## 2024-05-08 MED ORDER — MAGNESIUM GLUCONATE 500 (27 MG) MG PO TABS
500.0000 mg | ORAL_TABLET | Freq: Every day | ORAL | Status: DC
  Administered 2024-05-08 – 2024-05-14 (×7): 500 mg via ORAL
  Filled 2024-05-08 (×7): qty 1

## 2024-05-08 MED ORDER — ACETAMINOPHEN 500 MG PO TABS
1000.0000 mg | ORAL_TABLET | Freq: Four times a day (QID) | ORAL | Status: DC | PRN
  Administered 2024-05-09 – 2024-05-13 (×4): 1000 mg via ORAL
  Filled 2024-05-08 (×4): qty 2

## 2024-05-08 MED ORDER — DICLOFENAC SODIUM 1 % EX GEL
2.0000 g | Freq: Four times a day (QID) | CUTANEOUS | Status: DC
  Administered 2024-05-08 – 2024-05-15 (×28): 2 g via TOPICAL
  Filled 2024-05-08 (×2): qty 100

## 2024-05-08 NOTE — Progress Notes (Signed)
 Physical Therapy Session Note  Patient Details  Name: Larry Dawson MRN: [id number] Date of Birth: [birth year]  Today's Date: 05/08/2024 PT Individual Time: 1115-1200 PT Individual Time Calculation (min): 45 min   Short Term Goals: Week 3:  PT Short Term Goal 1 (Week 3): STG = LTG due to ELOS  Skilled Therapeutic Interventions/Progress Updates:     Pt received seated in dayroom and agrees to therapy. No complaint of pain. Pt performs stand step transfer to [facility] with minA and cues for weight shifting and positioning. Stand step to mat in main gym with same assistance and cues. Pt performs standing activities for NMR and balance training. Pt stands with CGA/minA and cues for posture and gaze utilization to improve balance. Pt performs lateral weight shifting with CGA/minA, with cues to engage RLE when WB. Pt progresses to performing forward steps with LLE to provide increased balance challenge, with staggered stance and RLE posterior to increase WB requirement. Initially pt performs with upper extremity support, and then PT provides pt with RUE support on RW to provide increased loading through RUE. Pt completes forward steps over 1cm bar with LLE, with cues to perform as slowly as possible to emphasize WB through RLE and NM control. Pt also perform forward and backward steps with RLE. PT provides minA overall, with cues at RLE to promote engagement and NM feedbback. WC transport back to room. Pt left seated with all needs witin reach.    Therapy Documentation Precautions:  Precautions Precautions: Fall Recall of Precautions/Restrictions: Impaired Precaution/Restrictions Comments: R-hemiparesis/hemiplegia Restrictions Weight Bearing Restrictions Per Provider Order: No   Therapy/Group: Individual Therapy  [name], PT, DPT 05/08/2024, 4:08 PM

## 2024-05-08 NOTE — Progress Notes (Signed)
 Occupational Therapy Session Note  Patient Details  Name: Larry Dawson MRN: [id number] Date of Birth: July 20, 1955  Today's Date: 05/08/2024 OT Individual Time: 0920-1015 OT Individual Time Calculation (min): 55 min   Short Term Goals: Week 3:  OT Short Term Goal 1 (Week 3): STGs=LTGs due to ELOS.  Skilled Therapeutic Interventions/Progress Updates:  Pt greeted in bed for skilled OT session with focus on BADL retraining.   Pain: Pt reported un-rated pain in R calf/ankle. MD secure chatted for request of medicated ointment. OT offering intermediate rest breaks and positioning suggestions throughout session to address pain/fatigue and maximize participation/safety in session.   Functional Transfers: Pt performed ambulatory transfer with RW from WC<>TTB with CGA, Min A provided for management of RW over threshold. Pt completed sit<>stands during self care task with Min A to maintain balance, UE supported on RW/grab bar.   Self Care Tasks: Pt completes the following self care tasks with levels of assistance noted below, UB: Pt completed UB bathing with set up assist. Pt completed upper body dressing with Min A due to material entanglement on upper back.  LB: Pt completed LB bathing utilizing a long-handled sponge with Min A for cleansing posterior periarea. Pt completed LB dressing with Mod A for threading BLE and hiking over bottom.  Pt completed sink-side grooming tasks with set up assist.  Therapeutic Activity: Pt was provided with foam block activity to improve RUE dexterity (pincer grasp and transportation of small manipulatives) and digit isolation. Pt instructed to complete activity outside of scheduled therapy time.   Pt remained in [facility] with 4Ps assessed and immediate needs met. Pt continues to be appropriate for skilled OT intervention to promote further functional independence in ADLs/IADLs.   Therapy Documentation Precautions:  Precautions Precautions: Fall Recall of  Precautions/Restrictions: Impaired Precaution/Restrictions Comments: R-hemiparesis/hemiplegia Restrictions Weight Bearing Restrictions Per Provider Order: No  Therapy/Group: Individual Therapy  Misty Pacini, OTS 05/08/2024, 7:24 AM

## 2024-05-08 NOTE — Plan of Care (Signed)
" °  Problem: Consults Goal: RH STROKE PATIENT EDUCATION Description: See Patient Education module for education specifics  Outcome: Progressing   Problem: RH BOWEL ELIMINATION Goal: RH STG MANAGE BOWEL WITH ASSISTANCE Description: STG Manage Bowel with supervision Assistance. Outcome: Progressing   Problem: RH BLADDER ELIMINATION Goal: RH STG MANAGE BLADDER WITH ASSISTANCE Description: STG Manage Bladder With supervision  Assistance Outcome: Progressing   Problem: RH SKIN INTEGRITY Goal: RH STG SKIN FREE OF INFECTION/BREAKDOWN Description: Skin free of infection with supervision assistance Outcome: Progressing   Problem: RH SAFETY Goal: RH STG ADHERE TO SAFETY PRECAUTIONS W/ASSISTANCE/DEVICE Description: STG Adhere to Safety Precautions With supervision Assistance/Device. Outcome: Progressing   Problem: RH PAIN MANAGEMENT Goal: RH STG PAIN MANAGED AT OR BELOW PT'S PAIN GOAL Description: <4 w/ prn Outcome: Progressing   Problem: RH KNOWLEDGE DEFICIT Goal: RH STG INCREASE KNOWLEDGE OF DIABETES Description: Manage increase knowledge of diabetes with supervision assistance from wife using educational materials provided  Outcome: Progressing Goal: RH STG INCREASE KNOWLEDGE OF HYPERTENSION Description: Manage increase knowledge of hypertension with supervision assistance from wife using educational materials provided  Outcome: Progressing   "

## 2024-05-08 NOTE — Progress Notes (Signed)
 Physical Therapy Session Note  Patient Details  Name: Larry Dawson MRN: [id number] Date of Birth: 03-Feb-1956  Today's Date: 05/08/2024 PT Individual Time: 1015-1115 + 1349-1500 PT Individual Time Calculation (min): 60 min + 71 min  Short Term Goals: Week 3:  PT Short Term Goal 1 (Week 3): STG = LTG due to ELOS  Skilled Therapeutic Interventions/Progress Updates:    SESSION 1: Pt presents in room in [facility], agreeable to PT handoff from OT. Pt reporting soreness in R adductors and R calf this session but does not seem to be limited by this. Session focused on gait training to promote increased gait speed and improved gait mechanics. Pt completes sit to stands with CGA/min assist to RW. Pt ambulates with RW from room to day room ~150', slowly, step to gait pattern leading with RLE, pt requires min cues for RLE positioning with pt demonstrating improved forward gaze. Pt comes to sitting on EOM for rest break. Pt completes rest break. Pt then ambulates multiple trials of 180' with bilateral HHA with min assist x2 trials as follows: 1st trial - 2:42 min 2nd trial - 1:41 min 3rd trial - 1:16 min Pt with improving gait mechanics with each trial, improving stability. Pt then ambulates without UE support with mod assist weaving through 3 cones with cues for weightshifting. Pt takes extended seated rest break then ambulates 180' no device min/mod assist, 2nd person providing stand by assist for safety. Pt then completes side stepping bilaterally with mod assist no UE support 3x6'. Pt remains seated on EOM at end of session, handoff to PT.   SESSION 2: Pt presents in room in [facility], agreeable to PT. Pt does not report pain during session. Session focused on gait training for stair negotiation, NMR for single limb stability and muscle fiber recruitment. Pt transported to main gym dependently for time management. Pt completes sit to stand with RW with CGA. Pt ambualtes with CGA/min assist 95' with RW as warm up  prior to stair negotiatoin. Pt then completes up/down 12 steps with BHRs, min/mod assist, 2nd person SBA for safety however not needed as pt demonstrating significant improvement in coordination and stability throughout activity. Pt then completes NMR with BUE support for dynamic standing balance, single limb stability, and BUE/BLE coordination including: - step taps 6 step x10 BLE - step ups 6 step x10 BLE (cues for RLE foot placement with stepping back off of step) - lateral step ups 6 step x10 RLE only - continuous training on nustep BUE/BLE x6 minutes Pt then completes NMR for RUE strengthening and coordination including - finger ladder x2 trials - wall slides x10 Pt requests to use bathroom, returned to room via WC, ambulates with RW to bathroom with min assist and pt able to manage lower body dressing with CGA/min assist for postural stability with pt using RUE support on RW. Pt requires min assist for postural stability while pt completes periarea hygiene completed in standing. Pt ambulates to sink with RW and completes hand hygiene with mod cues for RW management and sequencing as well as use of RUE. Pt then ambulates with RW from room to day room, min assist for postural stability with cues for step through gait pattern and increasing gait speed. Pt comes to sitting on nustep for above NMR. Pt returns to room ambulating with min assist with RW and remains seated in [facility] with all needs within reach, cal light in place and chair alarm donned and activated at end of session.  Therapy Documentation Precautions:  Precautions Precautions: Fall Recall of Precautions/Restrictions: Impaired Precaution/Restrictions Comments: R-hemiparesis/hemiplegia Restrictions Weight Bearing Restrictions Per Provider Order: No    Therapy/Group: Individual Therapy  Reche Ohara PT, DPT 05/08/2024, 11:19 AM

## 2024-05-08 NOTE — Progress Notes (Signed)
 "                                                        PROGRESS NOTE   Subjective/Complaints:  No events overnight. Vitals stable.  States he feels better today, no longer feels step off, but is having increasing pain in his right calf and ankle since DC baclofen .  Voltaren  gel minimally beneficial.  Otherwise, no complaints.  ROS: as per HPI. Denies CP, SOB, abd pain, N/V/D/C, or any other complaints at this time.   + Right upper and lower extremity aching--ongoing  Objective:   No results found.   Recent Labs    05/07/24 0510 05/08/24 0621  WBC 5.4 5.4  HGB 12.7* 13.0  HCT 37.9* 38.8*  PLT 219 234    Recent Labs    05/07/24 0510 05/08/24 0621  NA 138 140  K 4.0 3.9  CL 104 103  CO2 25 27  GLUCOSE 118* 111*  BUN 13 14  CREATININE 1.08 1.00  CALCIUM  9.3 9.3     Intake/Output Summary (Last 24 hours) at 05/08/2024 2337 Last data filed at 05/08/2024 1800 Gross per 24 hour  Intake 600 ml  Output 1000 ml  Net -400 ml        Physical Exam: Vital Signs Blood pressure 115/66, pulse 69, temperature 97.8 F (36.6 C), temperature source Oral, resp. rate 19, height 5' 7 (1.702 m), weight 95.7 kg, SpO2 100%.  Physical Exam  Constitutional: No apparent distress. Appropriate appearance for age.  Working in therapy gym. HENT: Status post left basal craniotomy, stable Eyes: PERRLA.  EOMI.  No obvious nystagmus.  Slight injection from dryness. Cardiovascular: RRR, no murmurs/rub/gallops. No Edema. Peripheral pulses 2+  Respiratory: CTAB. No rales, rhonchi, or wheezing. On RA.  Abdomen: + bowel sounds, normoactive. No distention or tenderness.  Skin: Posterior surgical site well-approximated and healing--no swelling, erythema, or fluctuance.   MSK:      No apparent deformity.        + TTP over right anterior lateral ankle, without obvious swelling.  Slight foot inversion positioning.  Ongoing.  Neurologic exam:  Cognition: AAO to person, place, time and event.   + Slight cognitive delay--improved today  Language: Moderate dysarthria, with some speech delay --improving gradually Insight: Fair insight into current condition.  Mood: Pleasant affect, appropriate mood.  Sensation: Reduced to light touch throughout the right hemibody CN:  + Right facial droop.   Coordination: Right upper and lower extremity ataxia, complicated by weakness Spasticity: MAS 1 right wrist flexors, finger flexors, elbow flexors, plantar flexors; MAS 0 right knee flexor       Strength:                RUE: 3+/5 SA, 3+/5 EF, 3+/5 EE, 4/5 WE, 4+/5 FF, 4/5 FA--improving                LUE:  5/5 SA, 5/5 EF, 5/5 EE, 5/5 WE, 5/5 FF, 5/5 FA                RLE: 3/5 HF, 4-/5 KE, 3/5  DF, 4/5  EHL, 4/5  PF--improving                LLE:  5/5 HF, 5/5 KE, 5/5  DF, 5/5  EHL, 5/5  PF  Assessment/Plan: 1. Functional deficits which require 3+ hours per day of interdisciplinary therapy in a comprehensive inpatient rehab setting. Physiatrist is providing close team supervision and 24 hour management of active medical problems listed below. Physiatrist and rehab team continue to assess barriers to discharge/monitor patient progress toward functional and medical goals  Care Tool:  Bathing    Body parts bathed by patient: Right arm, Chest, Abdomen, Front perineal area, Right upper leg, Left upper leg, Face   Body parts bathed by helper: Left arm, Right lower leg, Left lower leg, Buttocks     Bathing assist Assist Level: Moderate Assistance - Patient 50 - 74%     Upper Body Dressing/Undressing Upper body dressing   What is the patient wearing?: Pull over shirt    Upper body assist Assist Level: Minimal Assistance - Patient > 75%    Lower Body Dressing/Undressing Lower body dressing      What is the patient wearing?: Pants     Lower body assist Assist for lower body dressing: 2 Helpers     Toileting Toileting    Toileting assist Assist for toileting: 2 Helpers      Transfers Chair/bed transfer  Transfers assist     Chair/bed transfer assist level: Moderate Assistance - Patient 50 - 74%     Locomotion Ambulation   Ambulation assist      Assist level: 2 helpers Assistive device: Other (comment) (hand rail) Max distance: 30   Walk 10 feet activity   Assist     Assist level: 2 helpers Assistive device: Other (comment) (hand rail)   Walk 50 feet activity   Assist Walk 50 feet with 2 turns activity did not occur: Safety/medical concerns         Walk 150 feet activity   Assist Walk 150 feet activity did not occur: Safety/medical concerns         Walk 10 feet on uneven surface  activity   Assist Walk 10 feet on uneven surfaces activity did not occur: Safety/medical concerns         Wheelchair     Assist Is the patient using a wheelchair?: Yes Type of Wheelchair: Manual    Wheelchair assist level: Maximal Assistance - Patient 25 - 49% Max wheelchair distance: 87'    Wheelchair 50 feet with 2 turns activity    Assist        Assist Level: Maximal Assistance - Patient 25 - 49%   Wheelchair 150 feet activity     Assist      Assist Level: Total Assistance - Patient < 25%   Blood pressure 115/66, pulse 69, temperature 97.8 F (36.6 C), temperature source Oral, resp. rate 19, height 5' 7 (1.702 m), weight 95.7 kg, SpO2 100%.    Medical Problem List and Plan: 1. 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 Janjua             -patient may  shower with shower cap             -ELOS/Goals: 14-16d -CGA to Min A goals -DC date 05/12/24--> 05/15/24  - Stable to continue inpatient rehab  - 1/13: Going home with 2 daughters, one is CNA. Min to Mod A UB and Max A LB ADLs; poor body orientation. Other evals pending.   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  He is understanding   - 1/20: Min A transfers and walker 130 ft yesterday; Mod- Max A today handheld assist due to fatigue. Min-Mod A UB care, Mod A LB care due to poor midline orientation and balance. Mild dysarthria and aprosity. Some attention/reasoning deficits c/b personality.   - 1-23: Discussed discharge date with patient, wife.  They have considerable concerns that he is not yet ambulatory, did discuss that he is expected to need some assistance at discharge and independent ambulation is not expected.  Answered all questions.  - 1/27: Family education tomorrow. Family very hesitant to bring him home without being functionally independent. SPV bed mobility, Min A RW, walked 540 ft Min-Mod A with RW yesterday - with significant loss of balance. Improving with midline orientation and lateropulsion. Setup-Min A UB care; doing better with standing, shower with CGA for standing balance today. RUE improving. SPV-Min A goals. Mild dysarthria but at goal level Mod I for communication. Mild cog deficits with complex attention c/b mental inflexibility - SPV, goal Mod I.   - Likely custom AFO needed closer to discharge--ordered 1/27 for eval later this week  - 1-28: A few episodes today of dysarthria and increased right sided weakness/coordination difficulty, with stable BMP and vitals, without other concerning features for seizure.  Discussed with Dr. [name], repeat CT head with small hygroma but no hydrocephalus or obstruction.  Orthostatic vital stable, labs stable.  Monitor closely.    2.  Antithrombotics: -DVT/anticoagulation:  Pharmaceutical: Heparin  5kU BID initiated 04/14/2024             -antiplatelet therapy: N/A  1-21: Duplex right lower extremity negative  3. Pain Management/?complex migraines: Tylenol   as needed  -1-13 discussed positioning off of surgical site at nighttime.  Pain well-controlled on current regimen. - 1-14: Voltaren  gel 4 times daily to right ankle and calf.  Discussed bracing with therapies.  Add baclofen  5 mg 3 times daily for developing tightness/muscle aching on the right hemibody.  Consider right hip x-ray for arthritis if groin pain becomes worse.  1/15: Pain much improved with current regimen, monitor 1-17: Occipital nerve distribution of odd sensations across his head, not painful, does not want additional treatments at this time  -1/19 Will order Vas US  r/o DVT but think it is less likely--ultrasound negative  1-20: Increase baclofen  to 10 mg 3 times daily--improved pain tolerance  1-21: Increase nightly gabapentin  to 600 mg for calf and hand pain 1-22: Oversedated with current regimen; dropped gabapentin  back to 300 nightly, reduce baclofen  to 5 mg 3 times daily, add Robaxin  500 mg every 8 hours as needed for spasms--sedation improved 1-23 1-26: Still feeling cognitively slow, stop nightly gabapentin --no changes noted per patient 1-27 1-27: Daily AM headache, responsive to Tylenol , patient will be asking his wife to bring in pillows from home for better neck support.  05-07-27: Workup negative for etiologies of patient's lethargy/confusion; DC baclofen  and recheck this afternoon.  Note patient has been getting Voltaren  to right ankle and calf since 1-14, with minimal benefit; DC.  Add magnesium  supplement 250 mg nightly for cramping and repeat magnesium  levels in AM.  1-30: Lethargy improved with DC baclofen .  Increase nightly magnesium  supplement to 500 mg.  Increase Tylenol  to 1000 mg every 6 hours as needed.  4. Mood/Behavior/Sleep: Provide emotional support             -  antipsychotic agents: N/A   - sleeping well, apporpriate  5. Neuropsych/cognition: This patient is capable of making decisions on his own behalf.  - 1-27: Discussed cognitive deficits with patient,  difficulty with attention.  Discussed starting neurostimulant to help with this; patient declines at this time.  1-28: Episode of confusion today, self-limited.  CT head read pending as above.  BMP stable.  Will check LFTs, ammonia, and CBC in AM.  Encourage p.o. fluid intake, repeating orthostatic vitals.  1-29: CT head as above, workup grossly negative.  Patient feeling slightly better today but still a little off.  DC baclofen , follow-up with patient this afternoon.  1-30: Patient feeling much better after DC baclofen .   6. Skin/Wound Care: Routine skin checks - 1-14:   will reach out to Dr. [name] regarding DC staples POD 10 tomorrow--OK, order placed 1/15 1-16: Some uneven edges on the surgical line in the lower portion, reinforced with Steri-Strips today.  No drainage or open wound bed, no concern for dehiscence or infection--looking much better  1-27: Skin site looking good, can wait for Steri-Strips to follow-up for removal if needed later this week  7. Fluids/Electrolytes/Nutrition: Routine and analysis with follow-up chemistries, continue vitamins/supplements   - labs stable on admission; follow up  1-29: P.o. intakes down over the last day or so as patient has not been feeling well.  Encouraged today.  Repeat labs in AM--stable 1-30  8.  Diabetes mellitus.  Hemoglobin A1c 7.2.  Glucophage  500 mg twice daily, Lantus  insulin  8 units nightly - 1-18: Blood sugars well-controlled, have not resumed home Lantus .  Continue as needed insulin , using rarely, may be able to reduce blood glucose checks this week -1/19 well-controlled continue current regimen and monitor -1-23: Tolerating metformin , rare use of insulin  staying below 2 units.  Will monitor through the weekend  -05/02/24 CBGs looking great, would consider cutting back to BID checks if trend continues 1/26: No elevations >120; DC BG checks and insulin   - BG is well-controlled on labs since   9.  Hyperlipidemia.  Crestor  20mg   nightly  10.  Hypertension.  Norvasc  5 mg daily, Atenolol  50 mg daily, monitor with increased mobility -Blood pressure, vital stable.  Vitals:   05/05/24 0453 05/05/24 1302 05/05/24 1939 05/06/24 0530  BP: 120/65 131/77 130/74 122/71   05/06/24 1550 05/06/24 1940 05/07/24 0551 05/07/24 1301  BP: 123/64 125/65 122/67 (!) 132/59   05/07/24 2123 05/08/24 0523 05/08/24 1238 05/08/24 1945  BP: 122/69 122/84 113/65 115/66    11.  Class II obesity.  BMI 35.29.  Dietary follow-up  12.  Constipation/IBS.  MiraLAX  daily, Senokot 1 tablet nightly, colace 100mg  BID   - 1/12 LBM--may need laxatives increase tomorrow if no improvement  1-29: DC Colace, increase Senokot-S to 2 tabs twice daily  Last bowel movement 1-29, large  13.  COPD.  History of tobacco use.  Monitor oxygen saturations.  Discussed no nicotine. 1-20: Patient to bring in portable CPAP machine from home; nursing informed to assess and set up--pending  14. Urinary urgency. Wean off purwick.    - no incontinence, doing well  15. Left vision deficits.  Continue glasses taping, therapies.  - 1-17:  resolved today.  16.  Gastric reflux.  Resume Pepcid  40 mg daily, Protonix  40mg  nightly, Tums 3 times daily as needed   -Symptoms well-controlled on current regimen  17.  Stage IIIa CKD.  Baseline creatinine appears 1.1-1.4  -  Some fluctuations, but at baseline.  1-28/30: BMP stable,  encouraging p.o. fluids   LOS: 18 days A FACE TO FACE EVALUATION WAS PERFORMED  Larry Dawson 05/08/2024, 11:37 PM     "

## 2024-05-09 DIAGNOSIS — K5901 Slow transit constipation: Secondary | ICD-10-CM | POA: Diagnosis not present

## 2024-05-09 DIAGNOSIS — H579 Unspecified disorder of eye and adnexa: Secondary | ICD-10-CM | POA: Diagnosis not present

## 2024-05-09 DIAGNOSIS — I10 Essential (primary) hypertension: Secondary | ICD-10-CM | POA: Diagnosis not present

## 2024-05-09 DIAGNOSIS — D1800 Hemangioma unspecified site: Secondary | ICD-10-CM | POA: Diagnosis not present

## 2024-05-09 NOTE — Progress Notes (Signed)
 Speech Language Pathology Daily Session Note  Patient Details  Name: Larry Dawson MRN: [id number] Date of Birth: Oct 24, 1955  Today's Date: 05/09/2024 SLP Individual Time: 1010-1107 SLP Individual Time Calculation (min): 57 min  Short Term Goals: Week 2: SLP Short Term Goal 1 (Week 2): Patient will selectively attend to therapy tasks for 10 minutes given supervision assist. SLP Short Term Goal 2 (Week 2): Patient will solve complex functional problems @ modI SLP Short Term Goal 3 (Week 2): Patient will utilize SLOP speech intelligibility strategies at the conversation level @ modI  Skilled Therapeutic Interventions:  Pt seen for ST focused on cognition and speech goals. SLP facilitated medication management task targeting selective and alternating attention, problem solving, and working memory. Pt identified med errors in increasingly difficult tasks with 100% IND. During unstructured conversation, pt used speech intelligibility strategies IND and his intelligibility was 100%. Pt left in chair with call bell in reach. Continue ST POC.   Pain Pain Assessment Pain Scale: 0-10 Pain Score: 0-No pain  Therapy/Group: Individual Therapy  Waddell JONETTA Novak, MA CCC-SLP 05/09/2024, 4:45 PM

## 2024-05-09 NOTE — Progress Notes (Signed)
 "                                                        PROGRESS NOTE   Subjective/Complaints:  Pt doing well, slept ok, pain well managed, LBM yesterday per pt but not documented. Urinating fine. No other complaints or concerns.   ROS: as per HPI. Denies CP, SOB, abd pain, N/V/D/C, or any other complaints at this time.   + Right upper and lower extremity aching--ongoing  Objective:   No results found.   Recent Labs    05/07/24 0510 05/08/24 0621  WBC 5.4 5.4  HGB 12.7* 13.0  HCT 37.9* 38.8*  PLT 219 234    Recent Labs    05/07/24 0510 05/08/24 0621  NA 138 140  K 4.0 3.9  CL 104 103  CO2 25 27  GLUCOSE 118* 111*  BUN 13 14  CREATININE 1.08 1.00  CALCIUM  9.3 9.3     Intake/Output Summary (Last 24 hours) at 05/09/2024 1152 Last data filed at 05/09/2024 0913 Gross per 24 hour  Intake 360 ml  Output 400 ml  Net -40 ml        Physical Exam: Vital Signs Blood pressure 126/73, pulse 65, temperature 98.5 F (36.9 C), temperature source Oral, resp. rate 18, height 5' 7 (1.702 m), weight 95.7 kg, SpO2 99%.  Physical Exam  Constitutional: No apparent distress. Appropriate appearance for age.  Sitting in chair HENT: Status post left basal craniotomy, stable Eyes: PERRLA.  EOMI.  No obvious nystagmus.  Slight injection from dryness-improved Cardiovascular: RRR, no murmurs/rub/gallops. No Edema. Peripheral pulses 2+  Respiratory: CTAB. No rales, rhonchi, or wheezing. On RA.  Abdomen: + bowel sounds, normoactive. No distention or tenderness.  Skin: Posterior surgical site well-approximated and healing--no swelling, erythema, or fluctuance.  PRIOR EXAMS: MSK:      No apparent deformity.        + TTP over right anterior lateral ankle, without obvious swelling.  Slight foot inversion positioning.  Ongoing.  Neurologic exam:  Cognition: AAO to person, place, time and event.  + Slight cognitive delay--improved today  Language: Moderate dysarthria, with some  speech delay --improving gradually Insight: Fair insight into current condition.  Mood: Pleasant affect, appropriate mood.  Sensation: Reduced to light touch throughout the right hemibody CN:  + Right facial droop.   Coordination: Right upper and lower extremity ataxia, complicated by weakness Spasticity: MAS 1 right wrist flexors, finger flexors, elbow flexors, plantar flexors; MAS 0 right knee flexor       Strength:                RUE: 3+/5 SA, 3+/5 EF, 3+/5 EE, 4/5 WE, 4+/5 FF, 4/5 FA--improving                LUE:  5/5 SA, 5/5 EF, 5/5 EE, 5/5 WE, 5/5 FF, 5/5 FA                RLE: 3/5 HF, 4-/5 KE, 3/5  DF, 4/5  EHL, 4/5  PF--improving                LLE:  5/5 HF, 5/5 KE, 5/5  DF, 5/5  EHL, 5/5  PF    Assessment/Plan: 1. Functional deficits which require 3+ hours per day of interdisciplinary therapy in  a comprehensive inpatient rehab setting. Physiatrist is providing close team supervision and 24 hour management of active medical problems listed below. Physiatrist and rehab team continue to assess barriers to discharge/monitor patient progress toward functional and medical goals  Care Tool:  Bathing    Body parts bathed by patient: Right arm, Chest, Abdomen, Front perineal area, Right upper leg, Left upper leg, Face   Body parts bathed by helper: Left arm, Right lower leg, Left lower leg, Buttocks     Bathing assist Assist Level: Moderate Assistance - Patient 50 - 74%     Upper Body Dressing/Undressing Upper body dressing   What is the patient wearing?: Pull over shirt    Upper body assist Assist Level: Minimal Assistance - Patient > 75%    Lower Body Dressing/Undressing Lower body dressing      What is the patient wearing?: Pants     Lower body assist Assist for lower body dressing: 2 Helpers     Toileting Toileting    Toileting assist Assist for toileting: 2 Helpers     Transfers Chair/bed transfer  Transfers assist     Chair/bed transfer assist level:  Moderate Assistance - Patient 50 - 74%     Locomotion Ambulation   Ambulation assist      Assist level: 2 helpers Assistive device: Other (comment) (hand rail) Max distance: 30   Walk 10 feet activity   Assist     Assist level: 2 helpers Assistive device: Other (comment) (hand rail)   Walk 50 feet activity   Assist Walk 50 feet with 2 turns activity did not occur: Safety/medical concerns         Walk 150 feet activity   Assist Walk 150 feet activity did not occur: Safety/medical concerns         Walk 10 feet on uneven surface  activity   Assist Walk 10 feet on uneven surfaces activity did not occur: Safety/medical concerns         Wheelchair     Assist Is the patient using a wheelchair?: Yes Type of Wheelchair: Manual    Wheelchair assist level: Maximal Assistance - Patient 25 - 49% Max wheelchair distance: 61'    Wheelchair 50 feet with 2 turns activity    Assist        Assist Level: Maximal Assistance - Patient 25 - 49%   Wheelchair 150 feet activity     Assist      Assist Level: Total Assistance - Patient < 25%   Blood pressure 126/73, pulse 65, temperature 98.5 F (36.9 C), temperature source Oral, resp. rate 18, height 5' 7 (1.702 m), weight 95.7 kg, SpO2 99%.    Medical Problem List and Plan: 1. 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 Janjua             -patient may  shower with shower cap             -ELOS/Goals: 14-16d -CGA to Min A goals -DC date 05/12/24--> 05/15/24  - Stable to continue inpatient rehab  - 1/13: Going home with 2 daughters, one is CNA. Min to Mod A UB and Max A LB ADLs; poor body orientation. Other evals pending.   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  He is understanding   - 1/20: Min A transfers and walker 130 ft yesterday; Mod- Max A today handheld assist due to fatigue. Min-Mod A UB care, Mod A LB care due to poor midline orientation and balance. Mild dysarthria and aprosity. Some attention/reasoning deficits c/b personality.   - 1-23: Discussed discharge date with patient, wife.  They have considerable concerns that he is not yet ambulatory, did discuss that he is expected to need some assistance at discharge and independent ambulation is not expected.  Answered all questions.  - 1/27: Family education tomorrow. Family very hesitant to bring him home without being functionally independent. SPV bed mobility, Min A RW, walked 540 ft Min-Mod A with RW yesterday - with significant loss of balance. Improving with midline orientation and lateropulsion. Setup-Min A UB care; doing better with standing, shower with CGA for standing balance today. RUE improving. SPV-Min A goals. Mild dysarthria but at goal level Mod I for communication. Mild cog deficits with complex attention c/b mental inflexibility - SPV, goal Mod I.   - Likely custom AFO needed closer to discharge--ordered 1/27 for eval later this week  - 1-28: A few episodes today of dysarthria and increased right sided weakness/coordination difficulty, with stable BMP and vitals, without other concerning features for seizure.  Discussed with Dr. [name], repeat CT head with small hygroma but no hydrocephalus or obstruction.  Orthostatic vital stable, labs stable.  Monitor closely.    2.  Antithrombotics: -DVT/anticoagulation:  Pharmaceutical: Heparin  5kU BID initiated 04/14/2024             -antiplatelet therapy: N/A  1-21: Duplex right lower extremity negative  3. Pain Management/?complex migraines: Tylenol  as needed  -1-13 discussed positioning off of surgical site at nighttime.  Pain  well-controlled on current regimen. - 1-14: Voltaren  gel 4 times daily to right ankle and calf.  Discussed bracing with therapies.  Add baclofen  5 mg 3 times daily for developing tightness/muscle aching on the right hemibody.  Consider right hip x-ray for arthritis if groin pain becomes worse.  1/15: Pain much improved with current regimen, monitor 1-17: Occipital nerve distribution of odd sensations across his head, not painful, does not want additional treatments at this time  -1/19 Will order Vas US  r/o DVT but think it is less likely--ultrasound negative  1-20: Increase baclofen  to 10 mg 3 times daily--improved pain tolerance  1-21: Increase nightly gabapentin  to 600 mg for calf and hand pain 1-22: Oversedated with current regimen; dropped gabapentin  back to 300 nightly, reduce baclofen  to 5 mg 3 times daily, add Robaxin  500 mg every 8 hours as needed for spasms--sedation improved 1-23 1-26: Still feeling cognitively slow, stop nightly gabapentin --no changes noted per patient 1-27 1-27: Daily AM headache, responsive to Tylenol , patient will be asking his wife to bring in pillows from home for better neck support.  05-07-27: Workup negative for etiologies of patient's lethargy/confusion; DC baclofen  and recheck this afternoon.  Note patient has been getting Voltaren  to right ankle and calf since 1-14, with minimal benefit; DC.  Add magnesium  supplement 250 mg nightly for cramping and repeat magnesium  levels in AM.  1-30: Lethargy improved with DC baclofen .  Increase nightly magnesium  supplement to 500 mg.  Increase Tylenol  to 1000 mg every 6 hours as needed.  4. Mood/Behavior/Sleep: Provide emotional support             -antipsychotic agents: N/A   -  sleeping well, apporpriate  5. Neuropsych/cognition: This patient is capable of making decisions on his own behalf.  - 1-27: Discussed cognitive deficits with patient, difficulty with attention.  Discussed starting neurostimulant to help with this;  patient declines at this time.  1-28: Episode of confusion today, self-limited.  CT head read pending as above.  BMP stable.  Will check LFTs, ammonia, and CBC in AM.  Encourage p.o. fluid intake, repeating orthostatic vitals.  1-29: CT head as above, workup grossly negative.  Patient feeling slightly better today but still a little off.  DC baclofen , follow-up with patient this afternoon.  1-30: Patient feeling much better after DC baclofen .   6. Skin/Wound Care: Routine skin checks - 1-14:   will reach out to Dr. [name] regarding DC staples POD 10 tomorrow--OK, order placed 1/15 1-16: Some uneven edges on the surgical line in the lower portion, reinforced with Steri-Strips today.  No drainage or open wound bed, no concern for dehiscence or infection--looking much better  1-27: Skin site looking good, can wait for Steri-Strips to follow-up for removal if needed later this week  7. Fluids/Electrolytes/Nutrition: Routine and analysis with follow-up chemistries, continue vitamins/supplements   - labs stable on admission; follow up  1-29: P.o. intakes down over the last day or so as patient has not been feeling well.  Encouraged today.  Repeat labs in AM--stable 1-30  8.  Diabetes mellitus.  Hemoglobin A1c 7.2.  Glucophage  500 mg twice daily, Lantus  insulin  8 units nightly - 1-18: Blood sugars well-controlled, have not resumed home Lantus .  Continue as needed insulin , using rarely, may be able to reduce blood glucose checks this week -1/19 well-controlled continue current regimen and monitor -1-23: Tolerating metformin , rare use of insulin  staying below 2 units.  Will monitor through the weekend  -05/02/24 CBGs looking great, would consider cutting back to BID checks if trend continues 1/26: No elevations >120; DC BG checks and insulin   - BG is well-controlled on labs since   9.  Hyperlipidemia.  Crestor  20mg  nightly  10.  Hypertension.  Norvasc  5 mg daily, Atenolol  50 mg daily, monitor with  increased mobility -Blood pressure, vital stable.  Vitals:   05/05/24 1302 05/05/24 1939 05/06/24 0530 05/06/24 1550  BP: 131/77 130/74 122/71 123/64   05/06/24 1940 05/07/24 0551 05/07/24 1301 05/07/24 2123  BP: 125/65 122/67 (!) 132/59 122/69   05/08/24 0523 05/08/24 1238 05/08/24 1945 05/09/24 0500  BP: 122/84 113/65 115/66 126/73    11.  Class II obesity.  BMI 35.29.  Dietary follow-up  12.  Constipation/IBS.  MiraLAX  daily, Senokot 1 tablet nightly, colace 100mg  BID   - 1/12 LBM--may need laxatives increase tomorrow if no improvement  1-29: DC Colace, increase Senokot-S to 2 tabs twice daily  -05/09/24 LBM yesterday per pt but not documented, monitor  13.  COPD.  History of tobacco use.  Monitor oxygen saturations.  Discussed no nicotine. 1-20: Patient to bring in portable CPAP machine from home; nursing informed to assess and set up--pending  14. Urinary urgency. Wean off purwick.    - no incontinence, doing well  15. Left vision deficits.  Continue glasses taping, therapies.  - 1-17:  resolved today.  16.  Gastric reflux.  Resume Pepcid  40 mg daily, Protonix  40mg  nightly, Tums 3 times daily as needed   -Symptoms well-controlled on current regimen  17.  Stage IIIa CKD.  Baseline creatinine appears 1.1-1.4  -  Some fluctuations, but at baseline.  1-28/30: BMP stable, encouraging p.o.  fluids   LOS: 19 days A FACE TO FACE EVALUATION WAS PERFORMED  [address] 05/09/2024, 11:52 AM     "

## 2024-05-09 NOTE — Plan of Care (Signed)
" °  Problem: Consults Goal: RH STROKE PATIENT EDUCATION Description: See Patient Education module for education specifics  Outcome: Progressing   Problem: RH BOWEL ELIMINATION Goal: RH STG MANAGE BOWEL WITH ASSISTANCE Description: STG Manage Bowel with supervision Assistance. Outcome: Progressing   Problem: RH BLADDER ELIMINATION Goal: RH STG MANAGE BLADDER WITH ASSISTANCE Description: STG Manage Bladder With supervision  Assistance Outcome: Progressing   Problem: RH SKIN INTEGRITY Goal: RH STG SKIN FREE OF INFECTION/BREAKDOWN Description: Skin free of infection with supervision assistance Outcome: Progressing   Problem: RH SAFETY Goal: RH STG ADHERE TO SAFETY PRECAUTIONS W/ASSISTANCE/DEVICE Description: STG Adhere to Safety Precautions With supervision Assistance/Device. Outcome: Progressing   Problem: RH KNOWLEDGE DEFICIT Goal: RH STG INCREASE KNOWLEDGE OF DIABETES Description: Manage increase knowledge of diabetes with supervision assistance from wife using educational materials provided  Outcome: Progressing Goal: RH STG INCREASE KNOWLEDGE OF HYPERTENSION Description: Manage increase knowledge of hypertension with supervision assistance from wife using educational materials provided  Outcome: Progressing   "

## 2024-05-09 NOTE — Plan of Care (Signed)
" °  Problem: Consults Goal: RH STROKE PATIENT EDUCATION Description: See Patient Education module for education specifics  Outcome: Progressing   Problem: RH BOWEL ELIMINATION Goal: RH STG MANAGE BOWEL WITH ASSISTANCE Description: STG Manage Bowel with supervision Assistance. Outcome: Progressing   Problem: RH BLADDER ELIMINATION Goal: RH STG MANAGE BLADDER WITH ASSISTANCE Description: STG Manage Bladder With supervision  Assistance Outcome: Progressing   Problem: RH SKIN INTEGRITY Goal: RH STG SKIN FREE OF INFECTION/BREAKDOWN Description: Skin free of infection with supervision assistance Outcome: Progressing   Problem: RH SAFETY Goal: RH STG ADHERE TO SAFETY PRECAUTIONS W/ASSISTANCE/DEVICE Description: STG Adhere to Safety Precautions With supervision Assistance/Device. Outcome: Progressing   Problem: RH PAIN MANAGEMENT Goal: RH STG PAIN MANAGED AT OR BELOW PT'S PAIN GOAL Description: <4 w/ prn Outcome: Progressing   Problem: RH KNOWLEDGE DEFICIT Goal: RH STG INCREASE KNOWLEDGE OF DIABETES Description: Manage increase knowledge of diabetes with supervision assistance from wife using educational materials provided  Outcome: Progressing Goal: RH STG INCREASE KNOWLEDGE OF HYPERTENSION Description: Manage increase knowledge of hypertension with supervision assistance from wife using educational materials provided  Outcome: Progressing   "

## 2024-05-10 DIAGNOSIS — K5901 Slow transit constipation: Secondary | ICD-10-CM | POA: Diagnosis not present

## 2024-05-10 DIAGNOSIS — D1800 Hemangioma unspecified site: Secondary | ICD-10-CM | POA: Diagnosis not present

## 2024-05-10 DIAGNOSIS — H579 Unspecified disorder of eye and adnexa: Secondary | ICD-10-CM | POA: Diagnosis not present

## 2024-05-10 DIAGNOSIS — I10 Essential (primary) hypertension: Secondary | ICD-10-CM | POA: Diagnosis not present

## 2024-05-10 MED ORDER — NAPHAZOLINE-PHENIRAMINE 0.025-0.3 % OP SOLN
1.0000 [drp] | Freq: Four times a day (QID) | OPHTHALMIC | Status: DC | PRN
  Filled 2024-05-10: qty 15

## 2024-05-10 NOTE — Plan of Care (Signed)
" °  Problem: Consults Goal: RH STROKE PATIENT EDUCATION Description: See Patient Education module for education specifics  Outcome: Progressing   Problem: RH BOWEL ELIMINATION Goal: RH STG MANAGE BOWEL WITH ASSISTANCE Description: STG Manage Bowel with supervision Assistance. Outcome: Progressing   Problem: RH BLADDER ELIMINATION Goal: RH STG MANAGE BLADDER WITH ASSISTANCE Description: STG Manage Bladder With supervision  Assistance Outcome: Progressing   Problem: RH SKIN INTEGRITY Goal: RH STG SKIN FREE OF INFECTION/BREAKDOWN Description: Skin free of infection with supervision assistance Outcome: Progressing   Problem: RH SAFETY Goal: RH STG ADHERE TO SAFETY PRECAUTIONS W/ASSISTANCE/DEVICE Description: STG Adhere to Safety Precautions With supervision Assistance/Device. Outcome: Progressing   "

## 2024-05-11 DIAGNOSIS — D1800 Hemangioma unspecified site: Secondary | ICD-10-CM | POA: Diagnosis not present

## 2024-05-11 LAB — CBC
HCT: 38.6 % — ABNORMAL LOW (ref 39.0–52.0)
Hemoglobin: 12.7 g/dL — ABNORMAL LOW (ref 13.0–17.0)
MCH: 27.9 pg (ref 26.0–34.0)
MCHC: 32.9 g/dL (ref 30.0–36.0)
MCV: 84.8 fL (ref 80.0–100.0)
Platelets: 215 10*3/uL (ref 150–400)
RBC: 4.55 MIL/uL (ref 4.22–5.81)
RDW: 13.3 % (ref 11.5–15.5)
WBC: 6.4 10*3/uL (ref 4.0–10.5)
nRBC: 0 % (ref 0.0–0.2)

## 2024-05-11 LAB — BASIC METABOLIC PANEL WITH GFR
Anion gap: 8 (ref 5–15)
BUN: 15 mg/dL (ref 8–23)
CO2: 27 mmol/L (ref 22–32)
Calcium: 9.1 mg/dL (ref 8.9–10.3)
Chloride: 105 mmol/L (ref 98–111)
Creatinine, Ser: 1.03 mg/dL (ref 0.61–1.24)
GFR, Estimated: >60 mL/min
Glucose, Bld: 122 mg/dL — ABNORMAL HIGH (ref 70–99)
Potassium: 4 mmol/L (ref 3.5–5.1)
Sodium: 140 mmol/L (ref 135–145)

## 2024-05-11 NOTE — Progress Notes (Signed)
 Speech Language Pathology Daily Session Note  Patient Details  Name: Larry Dawson MRN: [id number] Date of Birth: 09-26-1955  Today's Date: 05/11/2024 SLP Individual Time: 1445-1530 SLP Individual Time Calculation (min): 45 min  Short Term Goals: Week 2: SLP Short Term Goal 1 (Week 2): Patient will selectively attend to therapy tasks for 10 minutes given supervision assist. SLP Short Term Goal 2 (Week 2): Patient will solve complex functional problems @ modI SLP Short Term Goal 3 (Week 2): Patient will utilize SLOP speech intelligibility strategies at the conversation level @ modI  Skilled Therapeutic Interventions:   Pt greeted at bedside for tx targeting cognition and communication. Upon SLP arrival, pt was in his w/c with his wife in the room. SLP initiated functional idiom verbal task targeting problem solving, abstract thinking, and speech intelligibility. Pt independently provided intelligible and relevant explanations. Next, pt participated in additional verbal task targeting selective attention and problem solving with minA. Again, pt independently maintained 100% speech intelligibility throughout. At the end of tx tasks, pt was left in w/c w/ call call bell w/in reach and his wife in the room. Recommend cont ST per POC.  Pain  No pain reported.  Therapy/Group: Individual Therapy  Jeffory Bounds 05/11/2024, 3:52 PM

## 2024-05-11 NOTE — Plan of Care (Signed)
" °  Problem: Consults Goal: RH STROKE PATIENT EDUCATION Description: See Patient Education module for education specifics  Outcome: Progressing   Problem: RH BOWEL ELIMINATION Goal: RH STG MANAGE BOWEL WITH ASSISTANCE Description: STG Manage Bowel with supervision Assistance. Outcome: Progressing   Problem: RH BLADDER ELIMINATION Goal: RH STG MANAGE BLADDER WITH ASSISTANCE Description: STG Manage Bladder With supervision  Assistance Outcome: Progressing   Problem: RH SKIN INTEGRITY Goal: RH STG SKIN FREE OF INFECTION/BREAKDOWN Description: Skin free of infection with supervision assistance Outcome: Progressing   Problem: RH SAFETY Goal: RH STG ADHERE TO SAFETY PRECAUTIONS W/ASSISTANCE/DEVICE Description: STG Adhere to Safety Precautions With supervision Assistance/Device. Outcome: Progressing   "

## 2024-05-11 NOTE — Progress Notes (Signed)
 Physical Therapy Session Note  Patient Details  Name: Larry Dawson MRN: [id number] Date of Birth: Dec 31, 1955  Today's Date: 05/11/2024 PT Individual Time: 9052-8941 PT Individual Time Calculation (min): 71 min   Short Term Goals: Week 3:  PT Short Term Goal 1 (Week 3): STG = LTG due to ELOS  Skilled Therapeutic Interventions/Progress Updates:     Pt received seated in [facility] and agrees to therapy. No complaint of pain, but does report needing to have bowel movement. Pt performs stand step to toilet with grab bars and cues for positioning and safety. Pt continent of bowel and able to complete pericare in standing with RUE support on grab bar, and setup assistance. Pt pulls up pants with CGA. Stand step back to [facility] with CGA and cues for hand placement.  Pt washes hands at sink to work on coordination and attention. WC transport to gym. Pt performs standing activities in parallel bars to work on balance, coordination, Rt sided attention and NMR. Pt performs multiple reps of sit to stand with CGA nad cues for body mechanics and hand placement. Pt utilizes only RUE support to promote increased loading through Rt hemibody and increased balance challenge. Pt performs side stepping to the Lt and Rt with mirror for visual feedback and CGA at trunk, with tactile cues at Rt hip to engage hip abductors. Pt performs x25' in each direction.   Pt then completes alternating foot taps on 4 step with RUE support, and cues to engage LLE muscle groups to prevent collapse to the Rt side. Pt completes x40 steps with CGA/minA progressing to less physical assistance to promote increased independence and less reliance on PT to prevent Rt sided LOBs. Pt completes additional bout of x30 steps with similar assistance and cues.   Therapy Documentation Precautions:  Precautions Precautions: Fall Recall of Precautions/Restrictions: Impaired Precaution/Restrictions Comments: R-hemiparesis/hemiplegia Restrictions Weight  Bearing Restrictions Per Provider Order: No   Therapy/Group: Individual Therapy  [name], PT, DPT 05/11/2024, 3:40 PM

## 2024-05-11 NOTE — Progress Notes (Signed)
 Occupational Therapy Session Note  Patient Details  Name: Larry Dawson MRN: [id number] Date of Birth: 03/20/1956  Today's Date: 05/11/2024 OT Individual Time: 8694-8584 OT Individual Time Calculation (min): 70 min   Short Term Goals: Week 3:  OT Short Term Goal 1 (Week 3): STGs=LTGs due to ELOS.  Skilled Therapeutic Interventions/Progress Updates:  Pt greeted in [facility] for skilled OT session with focus on functional mobility and RUE functional reach/fine motor skills.   Pain: Pt with no reports of pain. OT offering intermediate rest breaks and positioning suggestions throughout session to address pain/fatigue and maximize participation/safety in session.   Functional Transfers: Pt required CGA for stand-step transfer with RW from WC<>EOM and series of sit<>stands completed throughout session.  Therapeutic Activities: Pt completed standing cup stacking activity to promote functional use of RUE with unilateral support on the RW. Activity upgraded to include a ~90 degree turn to reach for cups placed posteriorly, without the use of the RW. Pt required CGA to maintain balance and verbal cues to step and clearance of the RLE.   Pt utilized arm skate seated at table top, to promote functional reach and ROM of the RUE. Pt required Min A to reach end range of shoulder flexion and coordinate abduction. Pt reported feeling a stretch in anterior deltoid and bicep, with no pain.  Pt engaged in Connect 4 game seated at table top, reaching across table for game pieces and utilizing 3-point pinch to place them with RUE. Activity upgraded to standing and retrieving game pieces from a container of other objects. Increased difficulty noted when placing game piece into grid. Pt required CGA to maintain balance while standing and verbal cues for positioning of RLE.   Pt utilized RUE to [employer]. Pt required increased time to complete task due to difficulties manipulating and maintaining grasp of Legos.    Pt remained in [facility] with 4Ps assessed and immediate needs met. Pt continues to be appropriate for skilled OT intervention to promote further functional independence in ADLs/IADLs.   Therapy Documentation Precautions:  Precautions Precautions: Fall Recall of Precautions/Restrictions: Impaired Precaution/Restrictions Comments: R-hemiparesis/hemiplegia Restrictions Weight Bearing Restrictions Per Provider Order: No  Therapy/Group: Individual Therapy  Misty Pacini, OTS 05/11/2024, 2:28 PM

## 2024-05-12 ENCOUNTER — Ambulatory Visit: Admitting: Neurosurgery

## 2024-05-12 DIAGNOSIS — D1800 Hemangioma unspecified site: Secondary | ICD-10-CM | POA: Diagnosis not present

## 2024-05-12 MED ORDER — TIZANIDINE HCL 4 MG PO TABS: 2.0000 mg | ORAL_TABLET | Freq: Three times a day (TID) | ORAL | Status: DC | PRN

## 2024-05-12 NOTE — Progress Notes (Signed)
 Occupational Therapy Session Note  Patient Details  Name: Larry Dawson MRN: [id number] Date of Birth: 1955/09/08  Today's Date: 05/12/2024 OT Individual Time: 9174-9144 OT Individual Time Calculation (min): 30 min   Today's Date: 05/12/2024 OT Individual Time: 1420-1530 OT Individual Time Calculation (min): 70 min   Short Term Goals: Week 3:  OT Short Term Goal 1 (Week 3): STGs=LTGs due to ELOS.  Skilled Therapeutic Interventions/Progress Updates:  Session 1: Pt greeted in bed for skilled OT session with focus on BADL retraining (dressing).   Pain: Pt with no reports of pain. OT offering intermediate rest breaks and positioning suggestions throughout session to address pain/fatigue and maximize participation/safety in session.   Functional Transfers: Pt completed sit<>stand from EOB with CGA, and ambulating transfer using RW from EOB<>WC with CGA.   Self Care Tasks: Pt completes the following self care tasks with levels of assistance noted below, UB: Pt completed UB dressing with set up assist at EOB. LB: Pt completed LB dressing at EOB with set up assist for threading and CGA for standing to hike over bottom. Sock aide introduced, required Min A to stabilize aid and don sock due to newness of equipment. Performance improved with repetition.   Pt remained in WC to eat breakfast prior to PT session, with 4Ps assessed and immediate needs met. Pt continues to be appropriate for skilled OT intervention to promote further functional independence in ADLs/IADLs.   Session 2: Pt greeted in [facility] for skilled OT session with focus on BADL retraining at shower level and RUE/RLE NMR.   Pain: Pt with no reports of pain. OT offering intermediate rest breaks and positioning suggestions throughout session to address pain/fatigue and maximize participation/safety in session.   Functional Transfers: Pt ambulated with RW greater than household distance with CGA, Min A required for 1 LOB. Pt requires  re-iteration of use of RW during self care tasks to reduce fall risk and burden of care.  Self Care Tasks: Pt completes the following self care tasks with levels of assistance noted below, UB: Pt completed UB bathing and dressing with supervision. Pt preferred to put gown on at end of session. LB: Pt completed LB bathing utilizing a long-handled sponge with CGA for standing to cleanse posterior periarea. Pt donned undergarments with Min A for threading over toes of RLE and CGA for standing to hike over bottom.  Toileting: Pt completed toileting with CGA for standing to cleanse posterior periarea.  Therapeutic Exercise: Pt completed 10-minutes of exercise at level 5 on the NuStep for RUE/RLE NMR. Pt required supervision and verbal cues to maintain alignment of RLE, and grip handle/reduce wrist flexion of RUE. Coban was applied to wrist for proprioceptive input.  Pt remained in recliner with 4Ps assessed and immediate needs met. Pt continues to be appropriate for skilled OT intervention to promote further functional independence in ADLs/IADLs.   Therapy Documentation Precautions:  Precautions Precautions: Fall Recall of Precautions/Restrictions: Impaired Precaution/Restrictions Comments: R-hemiparesis/hemiplegia Restrictions Weight Bearing Restrictions Per Provider Order: No  Therapy/Group: Individual Therapy  Misty Pacini, OTS 05/12/2024, 8:24 AM

## 2024-05-12 NOTE — Progress Notes (Signed)
 Physical Therapy Session Note  Patient Details  Name: Larry Dawson MRN: [id number] Date of Birth: [birth year]  Today's Date: 05/12/2024 PT Individual Time: 0905-1000 PT Individual Time Calculation (min): 55 min   Short Term Goals: Week 3:  PT Short Term Goal 1 (Week 3): STG = LTG due to ELOS  Skilled Therapeutic Interventions/Progress Updates:    Pt presents in room in bed, agreeable to PT. Pt reporting some aching pain in R shoulder, voltaren  gel applied. Session focused on therapeutic activities to promote improved transfers and safety with self care tasks, gait training with RW for upright tolerance and improving stability and independence with mobility as well as NMR for BLE muscle fiber recruitment and dynamic standing balance. Pt completes sit to stand transfer with RW with close supervision throughout session. Pt ambulates 310' with RW with CGA, demosntrating improved step through gait pattern as well as improved gait speed, does require very minimal cues for RLE foot placement to decrease R lateral lean. Pt comes to standing in //bars and sits to armchair for rest break. Pt then completes NMR in standing for dynamic standing balance, single limb stability, BLE coordinatoin including: - terminal knee extension R against yellow band x10 - TKE RLE against yellow band with LLE march x10 - same as above however no UE support (requires minA) x10 - hip 3 way (flexion/abduction/extension) yellow band BLE x10 each - side stepping yellow band 3x6'  Pt requesting to use restroom at this point, pt ambulates back to room ~200' with close supervision! But does require CGA for ambulating into bathroom for navigating threshold. Pt continent of bowel, charted, and pt able to manage lower body dressing in standing with CGA, completes periarea hygiene in standing with CGA for stability. Pt ambulates to sink to complete hand hygiene requiring cues for attention to RUE for hand hygiene otherwise able to  sequence and complete with supervision only. Pt then ambulates 210' with supervision with RW, slow gait speed noted but completed to promote improved independence with postural stability while ambulating with pt demonstrating significant improvement in self correction of LOB. Pt returns to room and remains seated in [facility] with all needs within reach, cal light in place and chair alarm donned and activated at end of session.   Therapy Documentation Precautions:  Precautions Precautions: Fall Recall of Precautions/Restrictions: Impaired Precaution/Restrictions Comments: R-hemiparesis/hemiplegia Restrictions Weight Bearing Restrictions Per Provider Order: No   Therapy/Group: Individual Therapy  Reche Ohara PT, DPT 05/12/2024, 10:44 AM

## 2024-05-12 NOTE — Progress Notes (Addendum)
 Patient ID: Larry Dawson, male   DOB: 08-26-1955, 69 y.o.   MRN: [id number]  SW ordered RW with [employer] via parachute.  SW met with pt in room to provide updates from team conference while he was on the phone with his wife. SW provided updates, d/c date remains 2/6, RW ordered, and outpatient referral has been placed. SW reviewed discharge process. Pt wife did not feel another family edu was necessary as she was not available tomorrow either. No questions/concerns reported.    Larry Dawson, MSW, LCSW Office: [phone] Cell: [phone] Fax: [phone]

## 2024-05-13 DIAGNOSIS — D1800 Hemangioma unspecified site: Secondary | ICD-10-CM | POA: Diagnosis not present

## 2024-05-13 MED ORDER — SENNOSIDES-DOCUSATE SODIUM 8.6-50 MG PO TABS: 2.0000 | ORAL_TABLET | Freq: Two times a day (BID) | ORAL | Status: AC

## 2024-05-13 MED ORDER — ACETAMINOPHEN 500 MG PO TABS: 1000.0000 mg | ORAL_TABLET | Freq: Four times a day (QID) | ORAL | Status: AC | PRN

## 2024-05-13 NOTE — Progress Notes (Signed)
 Physical Therapy Session Note  Patient Details  Name: Larry Dawson MRN: [id number] Date of Birth: 10/13/1955  Today's Date: 05/13/2024 PT Individual Time: 0830-0916 PT Individual Time Calculation (min): 46 min   Short Term Goals: Week 3:  PT Short Term Goal 1 (Week 3): STG = LTG due to ELOS  Skilled Therapeutic Interventions/Progress Updates:    Pt presents in room seated EOB with NT assisting with dressing, handoff from NT with pt agreeable to PT. Pt reports improved shoulder pain from yesterday, continues to endorse soreness but states improved pain with use of Voltaren . Session focused on therapeutic activities to promote improved safety with self care tasks, NMR for dynamic standing balance without UE support with multidirectional stepping stability. Pt completes sit to stands with RW with close supervision, completes stand step transfers with RW with close supervision, and completes toilet transfer with close supervision at this time. Pt able to manage lower body dressing in standing with supervision for pulling pants over hips (pt already had pants threaded upon therapist entering room). Pt ambulates to bathroom with close supervision (manages threshold into bathroom safely). Pt continent of bowel movement, charted. Pt able to stand to complete periarea hygiene with close supervision. Pt ambulates to sink to complete hand hygiene and oral hygiene with supervision, completed in standing to promote standing tolerance, dynamic standing balance, and sequencing for self care tasks. Pt then ambulates to day room with RW superviison and comes to sitting on EOM. Pt then completes sit to stand without device with CGA, completes NMR for multidirectional stepping stability without device including: - forward/backward 3x10' - side stepping 3x10' - figure 8 walking around 2 cones x2 trials Pt returns to room and remains seated in [facility] with all needs within reach, cal light in place at end of  session.   Therapy Documentation Precautions:  Precautions Precautions: Fall Recall of Precautions/Restrictions: Impaired Precaution/Restrictions Comments: R-hemiparesis/hemiplegia Restrictions Weight Bearing Restrictions Per Provider Order: No   Therapy/Group: Individual Therapy  Reche Ohara PT, DPT 05/13/2024, 9:19 AM

## 2024-05-13 NOTE — Progress Notes (Signed)
 "                                                        PROGRESS NOTE   Subjective/Complaints:  No events overnight. Shoulder is feeling better today, less stiff. Vital stable.  ROS: as per HPI. Denies CP, SOB, abd pain, N/V/D/C, or any other complaints at this time.   + Right upper and lower extremity aching--improved  Objective:   No results found.   Recent Labs    05/11/24 0449  WBC 6.4  HGB 12.7*  HCT 38.6*  PLT 215    Recent Labs    05/11/24 0449  NA 140  K 4.0  CL 105  CO2 27  GLUCOSE 122*  BUN 15  CREATININE 1.03  CALCIUM  9.1     Intake/Output Summary (Last 24 hours) at 05/13/2024 0836 Last data filed at 05/13/2024 0723 Gross per 24 hour  Intake 476 ml  Output 625 ml  Net -149 ml        Physical Exam: Vital Signs Blood pressure 109/70, pulse (!) 58, temperature 97.7 F (36.5 C), temperature source Oral, resp. rate 16, height 5' 7 (1.702 m), weight 95.7 kg, SpO2 100%.  Physical Exam  Constitutional: No apparent distress. Appropriate appearance for age.  Sitting up in therapy gym. HENT: Status post left basal craniotomy, stable Eyes: PERRLA.  EOMI.  No obvious nystagmus.  Slight injection from dryness-improved, but pt rubbing them Cardiovascular: RRR, no murmurs/rub/gallops. No Edema. Peripheral pulses 2+  Respiratory: CTAB. No rales, rhonchi, or wheezing. On RA.  Abdomen: + bowel sounds, normoactive. No distention or tenderness.  Skin: Posterior surgical site well-approximated and healing--no swelling, erythema, or fluctuance.  MSK:      No apparent deformity.  Full active and passive range of motion right upper extremity, no clicking or popping.  No subluxation.  Neurologic exam:  Cognition: AAO to person, place, time and event.  + Slight cognitive delay Language: Moderate dysarthria, with some speech delay --improving  Insight: Fair insight into current condition.  Mood: Pleasant affect, appropriate mood.  Sensation: Reduced to light  touch throughout the right hemibody CN:  + Right facial droop.   Coordination: Right upper and lower extremity ataxia, complicated by weakness Spasticity: MAS 1 right wrist flexors, finger flexors, elbow flexors, plantar flexors; MAS 1 right knee flexor       Strength:                RUE: 3+/5 SA, 3+/5 EF, 3+/5 EE, 4/5 WE, 4+/5 FF, 4/5 FA                 LUE:  5/5 SA, 5/5 EF, 5/5 EE, 5/5 WE, 5/5 FF, 5/5 FA                RLE: 3/5 HF, 4-/5 KE, 3/5  DF, 4/5  EHL, 4/5  PF                 LLE:  5/5 HF, 5/5 KE, 5/5  DF, 5/5  EHL, 5/5  PF   Physical exam unchanged from the above on reexamination 05/13/24    Assessment/Plan: 1. Functional deficits which require 3+ hours per day of interdisciplinary therapy in a comprehensive inpatient rehab setting. Physiatrist is providing close team supervision and 24 hour management of active  medical problems listed below. Physiatrist and rehab team continue to assess barriers to discharge/monitor patient progress toward functional and medical goals  Care Tool:  Bathing    Body parts bathed by patient: Right arm, Chest, Abdomen, Front perineal area, Right upper leg, Left upper leg, Face   Body parts bathed by helper: Left arm, Right lower leg, Left lower leg, Buttocks     Bathing assist Assist Level: Moderate Assistance - Patient 50 - 74%     Upper Body Dressing/Undressing Upper body dressing   What is the patient wearing?: Pull over shirt    Upper body assist Assist Level: Minimal Assistance - Patient > 75%    Lower Body Dressing/Undressing Lower body dressing      What is the patient wearing?: Pants     Lower body assist Assist for lower body dressing: 2 Helpers     Toileting Toileting    Toileting assist Assist for toileting: 2 Helpers     Transfers Chair/bed transfer  Transfers assist     Chair/bed transfer assist level: Moderate Assistance - Patient 50 - 74%     Locomotion Ambulation   Ambulation assist       Assist level: 2 helpers Assistive device: Other (comment) (hand rail) Max distance: 30   Walk 10 feet activity   Assist     Assist level: 2 helpers Assistive device: Other (comment) (hand rail)   Walk 50 feet activity   Assist Walk 50 feet with 2 turns activity did not occur: Safety/medical concerns         Walk 150 feet activity   Assist Walk 150 feet activity did not occur: Safety/medical concerns         Walk 10 feet on uneven surface  activity   Assist Walk 10 feet on uneven surfaces activity did not occur: Safety/medical concerns         Wheelchair     Assist Is the patient using a wheelchair?: Yes Type of Wheelchair: Manual    Wheelchair assist level: Maximal Assistance - Patient 25 - 49% Max wheelchair distance: 52'    Wheelchair 50 feet with 2 turns activity    Assist        Assist Level: Maximal Assistance - Patient 25 - 49%   Wheelchair 150 feet activity     Assist      Assist Level: Total Assistance - Patient < 25%   Blood pressure 109/70, pulse (!) 58, temperature 97.7 F (36.5 C), temperature source Oral, resp. rate 16, height 5' 7 (1.702 m), weight 95.7 kg, SpO2 100%.    Medical Problem List and Plan: 1. 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 Janjua             -patient may  shower with shower cap             -ELOS/Goals: 14-16d -CGA to Min A goals -DC date 05/12/24--> 05/15/24  - Stable to continue inpatient rehab  - 1/13: Going home with 2 daughters, one is CNA. Min to Mod A UB and Max A LB ADLs; poor body orientation. Other evals pending.   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  He is  understanding   - 1/20: Min A transfers and walker 130 ft yesterday; Mod- Max A today handheld assist due to fatigue. Min-Mod A UB care, Mod A LB care due to poor midline orientation and balance. Mild dysarthria and aprosity. Some attention/reasoning deficits c/b personality.   - 1-23: Discussed discharge date with patient, wife.  They have considerable concerns that he is not yet ambulatory, did discuss that he is expected to need some assistance at discharge and independent ambulation is not expected.  Answered all questions.  - 1/27: Family education tomorrow. Family very hesitant to bring him home without being functionally independent. SPV bed mobility, Min A RW, walked 540 ft Min-Mod A with RW yesterday - with significant loss of balance. Improving with midline orientation and lateropulsion. Setup-Min A UB care; doing better with standing, shower with CGA for standing balance today. RUE improving. SPV-Min A goals. Mild dysarthria but at goal level Mod I for communication. Mild cog deficits with complex attention c/b mental inflexibility - SPV, goal Mod I.   - Likely custom AFO needed closer to discharge--ordered 1/27 for eval later this week  - 1-28: A few episodes today of dysarthria and increased right sided weakness/coordination difficulty, with stable BMP and vitals, without other concerning features for seizure.  Discussed with Dr. [name], repeat CT head with small hygroma but no hydrocephalus or obstruction.  Orthostatic vital stable, labs stable.  Monitor closely.  2-3 Teams: SPV bed mobility, CGA transfers, walked 300 ft with RW CGA, stairs CGA, setup UBD, Min A LBD due to pain with reaching for R leg. Mod I complex reasoning.     2.  Antithrombotics: -DVT/anticoagulation:  Pharmaceutical: Heparin  5kU BID initiated 04/14/2024             -antiplatelet therapy: N/A  1-21: Duplex right lower extremity negative  2/3: DC DVT ppx, walking over 300 ft CGA  3. Pain  Management/?complex migraines: Tylenol  as needed  -1-13 discussed positioning off of surgical site at nighttime.  Pain well-controlled on current regimen. - 1-14: Voltaren  gel 4 times daily to right ankle and calf.  Discussed bracing with therapies.  Add baclofen  5 mg 3 times daily for developing tightness/muscle aching on the right hemibody.  Consider right hip x-ray for arthritis if groin pain becomes worse.  1/15: Pain much improved with current regimen, monitor 1-17: Occipital nerve distribution of odd sensations across his head, not painful, does not want additional treatments at this time  -1/19 Will order Vas US  r/o DVT but think it is less likely--ultrasound negative  1-20: Increase baclofen  to 10 mg 3 times daily--improved pain tolerance  1-21: Increase nightly gabapentin  to 600 mg for calf and hand pain 1-22: Oversedated with current regimen; dropped gabapentin  back to 300 nightly, reduce baclofen  to 5 mg 3 times daily, add Robaxin  500 mg every 8 hours as needed for spasms--sedation improved 1-23 1-26: Still feeling cognitively slow, stop nightly gabapentin --no changes noted per patient 1-27 1-27: Daily AM headache, responsive to Tylenol , patient will be asking his wife to bring in pillows from home for better neck support.  05-07-27: Workup negative for etiologies of patient's lethargy/confusion; DC baclofen  and recheck this afternoon.  Note patient has been getting Voltaren  to right ankle and calf since 1-14, with minimal benefit; DC.  Add magnesium  supplement 250 mg nightly for cramping and repeat magnesium  levels in AM.  1-30: Lethargy improved with DC baclofen .  Increase nightly magnesium  supplement to 500 mg.  Increase Tylenol  to 1000 mg every  6 hours as needed.  2-2: Pain ongoing, but tolerable on current regimen.  2-3: Right shoulder pain today, may be due to overuse/strain.  Switch as needed Robaxin  to tizanidine .  Responding well to Voltaren  gel, continue Tylenol  as needed--symptoms  improved 2-4  4. Mood/Behavior/Sleep: Provide emotional support             -antipsychotic agents: N/A   - sleeping well, apporpriate  5. Neuropsych/cognition: This patient is capable of making decisions on his own behalf.  - 1-27: Discussed cognitive deficits with patient, difficulty with attention.  Discussed starting neurostimulant to help with this; patient declines at this time.  1-28: Episode of confusion today, self-limited.  CT head read pending as above.  BMP stable.  Will check LFTs, ammonia, and CBC in AM.  Encourage p.o. fluid intake, repeating orthostatic vitals.  1-29: CT head as above, workup grossly negative.  Patient feeling slightly better today but still a little off.  DC baclofen , follow-up with patient this afternoon.  1-30: Patient feeling much better after DC baclofen .   6. Skin/Wound Care: Routine skin checks - 1-14:   will reach out to Dr. [name] regarding DC staples POD 10 tomorrow--OK, order placed 1/15 1-16: Some uneven edges on the surgical line in the lower portion, reinforced with Steri-Strips today.  No drainage or open wound bed, no concern for dehiscence or infection--looking much better  1-27: Skin site looking good, can wait for Steri-Strips to follow-up for removal if needed later this week  7. Fluids/Electrolytes/Nutrition: Routine and analysis with follow-up chemistries, continue vitamins/supplements   - labs stable on admission; follow up  1-29: P.o. intakes down over the last day or so as patient has not been feeling well.  Encouraged today.  Repeat labs in AM--stable 1-30, 2-2  8.  Diabetes mellitus.  Hemoglobin A1c 7.2.  Glucophage  500 mg twice daily, Lantus  insulin  8 units nightly - 1-18: Blood sugars well-controlled, have not resumed home Lantus .  Continue as needed insulin , using rarely, may be able to reduce blood glucose checks this week -1/19 well-controlled continue current regimen and monitor -1-23: Tolerating metformin , rare use of insulin   staying below 2 units.  Will monitor through the weekend  -05/02/24 CBGs looking great, would consider cutting back to BID checks if trend continues 1/26: No elevations >120; DC BG checks and insulin   - BG is well-controlled on labs since   9.  Hyperlipidemia.  Crestor  20mg  nightly  10.  Hypertension.  Norvasc  5 mg daily, Atenolol  50 mg daily, monitor with increased mobility -Blood pressure, vital stable.  Vitals:   05/09/24 0500 05/09/24 1304 05/09/24 1943 05/10/24 0354  BP: 126/73 (!) 101/58 117/68 120/64   05/10/24 1822 05/10/24 1955 05/11/24 0456 05/11/24 1954  BP: 122/72 126/80 110/71 119/73   05/12/24 0425 05/12/24 1313 05/12/24 1944 05/13/24 0407  BP: 121/66 129/66 (!) 124/58 109/70    11.  Class II obesity.  BMI 35.29.  Dietary follow-up  12.  Constipation/IBS.  MiraLAX  daily, Senokot 1 tablet nightly, colace 100mg  BID   - 1/12 LBM--may need laxatives increase tomorrow if no improvement  1-29: DC Colace, increase Senokot-S to 2 tabs twice daily  -05/10/24 LBM yesterday, monitor  13.  COPD.  History of tobacco use.  Monitor oxygen saturations.  Discussed no nicotine. 1-20: Patient to bring in portable CPAP machine from home; nursing informed to assess and set up--pending  14. Urinary urgency. Wean off purwick.    - no incontinence, doing well  15. Left vision  deficits.  Continue glasses taping, therapies.  - 1-17:  resolved today.  16.  Gastric reflux.  Resume Pepcid  40 mg daily, Protonix  40mg  nightly, Tums 3 times daily as needed   -Symptoms well-controlled on current regimen  17.  Stage IIIa CKD.  Baseline creatinine appears 1.1-1.4  -  Some fluctuations, but at baseline.  1-28/30: BMP stable, encouraging p.o. fluids  18. Eye itching/dryness: allergy eye drops added 05/10/24 for comfort, no injection noted, no drainage.   - 2-2: Improved with change to allergy drops, occurs intermittently at home, offered as needed Claritin but patient does not feel he needs it at this  point  19. R 4-5th digit numbness. OT to get trough splint, encourage elbow extension to limit ulnar impingement.    LOS: 23 days A FACE TO FACE EVALUATION WAS PERFORMED  Larry Dawson Larry Dawson 05/13/2024, 8:36 AM     "

## 2024-05-13 NOTE — Progress Notes (Signed)
 Inpatient Rehabilitation Discharge Medication Review by a Pharmacist  A complete drug regimen review was completed for this patient to identify any potential clinically significant medication issues.  High Risk Drug Classes Is patient taking? Indication by Medication  Antipsychotic No   Anticoagulant No   Antibiotic No   Opioid No   Antiplatelet No   Hypoglycemics/insulin  Yes Metformin , tirzepatide  - DM  Vasoactive Medication Yes Amlodipine , atenolol  - HTN  Chemotherapy No   Other Yes Ondansetron  prn N/V Omeprazole - reflux Rosuvastatin  - HLD APAP- pain  Senokot-S, PEG- constipation  Mg, arginine, Vit D, Fish oil , Vit C, Zn- supplements  Tizanidine - muscle spasms  Meclizine - dizziness  Cetirizine - allergies      Type of Medication Issue Identified Description of Issue Recommendation(s)  Drug Interaction(s) (clinically significant)     Duplicate Therapy     Allergy     No Medication Administration End Date     Incorrect Dose     Additional Drug Therapy Needed     Significant med changes from prior encounter (inform family/care partners about these prior to discharge). Medications discontinued during inpatient encounter - Glipizide , chlorthalidone  (as component of Tenoretic    Other       Clinically significant medication issues were identified that warrant physician communication and completion of prescribed/recommended actions by midnight of the next day:  Yes   Name of provider notified for urgent issues identified: Rolan Pitch, PA   Provider Method of Notification:   Pharmacist comments: Discussed whether pepcid  was to continue at discharge and provider stated only omeprazole  to continue.    Time spent performing this drug regimen review (minutes):  20 minutes  Massie Fila, PharmD Clinical Pharmacist  05/13/2024 1:49 PM

## 2024-05-13 NOTE — Progress Notes (Signed)
 Patient ID: Larry Dawson, male   DOB: 12-Jan-1956, 69 y.o.   MRN: [id number]  SW informed a handicap placard is still needed as previous required corrections. Will get family new handicap placard.    Graeme Jude, MSW, LCSW Office: [phone] Cell: [phone] Fax: [phone]

## 2024-05-13 NOTE — Progress Notes (Signed)
 Occupational Therapy Session Note  Patient Details  Name: Larry Dawson MRN: [id number] Date of Birth: 01-Oct-1955  Today's Date: 05/13/2024 OT Individual Time: 1005-1045 OT Individual Time Calculation (min): 40 min   Today's Date: 05/13/2024 OT Individual Time: 8694-8654 OT Individual Time Calculation (min): 40 min   Short Term Goals: Week 3:  OT Short Term Goal 1 (Week 3): STGs=LTGs due to ELOS.  Skilled Therapeutic Interventions/Progress Updates:  Session 1: Pt greeted in [facility] for skilled OT session with focus on self care and RUE/RLE NMR.   Pain: Pt reported pain in R shoulder and lower back in reference to therapeutic activity. Heat was applied to R shoulder during activity, and lower back at conclusion of session for pain relief. OT offering intermediate rest breaks and positioning suggestions throughout session to address pain/fatigue and maximize participation/safety in session.   Functional Transfers: Pt completed ambulating transfer greater than household distance using RW with CGA progressing towards supervision.   Self Care Tasks: Pt completes the following self care tasks with levels of assistance noted below, Toileting: Pt completed toileting with set up/supervision while standing to cleanse posterior periarea. Pt continent of bowel.  Therapeutic Activities: Pt completed activity in supine to improve ROM/tone management of RUE. Pt tasked with retrieving cones placed at side, transporting them into flexion to hand over to OTS. 1x10 reps completed with intermediate assistance to achieve/maintain grasp. Activity then transitioned to target shoulder abduction with transport of cones into adduction at 90 degrees. Pt reported pain during terminal flexion of RUE to reach for cones, rest/adjustments provided as needed.  Therapeutic Exercise: Pt completed the following LB exercises while in supine on mat for RLE NMR. Pt required tactile and verbal cues for maintaining  alignment/positioning of the RLE. 2 x 10 glute bridges   Pt remained in WC with 4Ps assessed and immediate needs met. Pt continues to be appropriate for skilled OT intervention to promote further functional independence in ADLs/IADLs.   Session 2: Pt greeted in [facility] for skilled OT session with focus on RUE NMR.   Pain: Pt reported unrated pain in R shoulder throughout session. Heat pack applied to relieve pain. OT offering intermediate rest breaks and positioning suggestions throughout session to address pain/fatigue and maximize participation/safety in session.   Functional Transfers: Pt completed ambulating transfer greater than household distance using RW with CGA progressing towards supervision.  Therapeutic Activities: Pt seated at tabletop, utilized RUE to copy a visual model and stack Legos. Pt required increased time due to difficulties manipulating and maintaining grasp on Legos. Pt also required verbal cuing to follow visual model and integrate RUE for task completion.   Therapeutic Exercise: Pt utilized UE Ranger seated EOM to improve RUE ROM and tone management. Pt required Min A to coordinate shoulder flexion and reach end range. Activity upgraded to standing with UE Ranger anchored on wall. Pt experienced pain when coordinating shoulder flexion in this position. Activity discontinued due to pain.   Pt utilized 1 lb weighted dowel to complete shoulder flexion AAROM, facilitated by OTS. Pt provided feedback to determine location of pain. During exercise, OT provided massage to reduce pain and stiffness in R pectoralis muscle.   Pt remained in [facility] with 4Ps assessed and immediate needs met. Pt continues to be appropriate for skilled OT intervention to promote further functional independence in ADLs/IADLs.   Therapy Documentation Precautions:  Precautions Precautions: Fall Recall of Precautions/Restrictions: Impaired Precaution/Restrictions Comments:  R-hemiparesis/hemiplegia Restrictions Weight Bearing Restrictions Per Provider Order: No  Therapy/Group:  Individual Therapy  Prepared by: Misty Pacini, OTS 05/13/2024, 7:29 AM  Signed by: Nereida Habermann, OTR/L, MSOT 05/13/24

## 2024-05-13 NOTE — Progress Notes (Signed)
 Speech Language Pathology Daily Session Note  Patient Details  Name: Larry Dawson MRN: [id number] Date of Birth: 12-15-1955  Today's Date: 05/13/2024 SLP Individual Time: 1440-1525 SLP Individual Time Calculation (min): 45 min  Short Term Goals: Week 2: SLP Short Term Goal 1 (Week 2): Patient will selectively attend to therapy tasks for 10 minutes given supervision assist. SLP Short Term Goal 2 (Week 2): Patient will solve complex functional problems @ modI SLP Short Term Goal 3 (Week 2): Patient will utilize SLOP speech intelligibility strategies at the conversation level @ modI  Skilled Therapeutic Interventions: Skilled therapy session focused on cognitive goals. SLP facilitated session by prompting completion of RBANS standardized assessment to re-evaluate cognitive linguistic skills before d/c. Patient scored WFL overall indicative of improvements in visual spatial skills and slight improvement in attention compared to evaluation. Patient left in bed with call bell in reach. Continue POC.  Pain None reported   Therapy/Group: Individual Therapy  Katheline Brendlinger M.A., CCC-SLP 05/13/2024, 7:54 AM

## 2024-05-14 ENCOUNTER — Other Ambulatory Visit (HOSPITAL_COMMUNITY): Payer: Self-pay

## 2024-05-14 LAB — BASIC METABOLIC PANEL WITH GFR
Anion gap: 10 (ref 5–15)
BUN: 10 mg/dL (ref 8–23)
CO2: 25 mmol/L (ref 22–32)
Calcium: 8.9 mg/dL (ref 8.9–10.3)
Chloride: 104 mmol/L (ref 98–111)
Creatinine, Ser: 0.94 mg/dL (ref 0.61–1.24)
GFR, Estimated: >60 mL/min
Glucose, Bld: 126 mg/dL — ABNORMAL HIGH (ref 70–99)
Potassium: 4 mmol/L (ref 3.5–5.1)
Sodium: 139 mmol/L (ref 135–145)

## 2024-05-14 LAB — CBC
HCT: 37.9 % — ABNORMAL LOW (ref 39.0–52.0)
Hemoglobin: 12.6 g/dL — ABNORMAL LOW (ref 13.0–17.0)
MCH: 28.2 pg (ref 26.0–34.0)
MCHC: 33.2 g/dL (ref 30.0–36.0)
MCV: 84.8 fL (ref 80.0–100.0)
Platelets: 206 10*3/uL (ref 150–400)
RBC: 4.47 MIL/uL (ref 4.22–5.81)
RDW: 13.6 % (ref 11.5–15.5)
WBC: 6 10*3/uL (ref 4.0–10.5)
nRBC: 0 % (ref 0.0–0.2)

## 2024-05-14 MED ORDER — OMEPRAZOLE 20 MG PO CPDR
20.0000 mg | DELAYED_RELEASE_CAPSULE | Freq: Every day | ORAL | 0 refills | Status: AC
  Filled 2024-05-14: qty 30, 30d supply, fill #0

## 2024-05-14 MED ORDER — NAPHAZOLINE-PHENIRAMINE 0.025-0.3 % OP SOLN
1.0000 [drp] | Freq: Four times a day (QID) | OPHTHALMIC | 0 refills | Status: AC | PRN
  Filled 2024-05-14: qty 15, 75d supply, fill #0

## 2024-05-14 MED ORDER — VITAMIN D 1000 UNITS PO TABS: 1000.0000 [IU] | ORAL_TABLET | Freq: Every day | ORAL | 0 refills | Status: AC

## 2024-05-14 MED ORDER — FISH OIL 1000 MG PO CAPS
1.0000 | ORAL_CAPSULE | Freq: Every day | ORAL | 0 refills | Status: AC
  Filled 2024-05-14: qty 30, 30d supply, fill #0

## 2024-05-14 MED ORDER — ATENOLOL 50 MG PO TABS
50.0000 mg | ORAL_TABLET | Freq: Every day | ORAL | 0 refills | Status: AC
  Filled 2024-05-14: qty 30, 30d supply, fill #0

## 2024-05-14 MED ORDER — ROSUVASTATIN CALCIUM 20 MG PO TABS
20.0000 mg | ORAL_TABLET | Freq: Every day | ORAL | 0 refills | Status: AC
  Filled 2024-05-14: qty 30, 30d supply, fill #0

## 2024-05-14 MED ORDER — AMLODIPINE BESYLATE 5 MG PO TABS
5.0000 mg | ORAL_TABLET | Freq: Every day | ORAL | 0 refills | Status: AC
  Filled 2024-05-14: qty 30, 30d supply, fill #0

## 2024-05-14 MED ORDER — MAGNESIUM GLUCONATE 500 (27 MG) MG PO TABS
500.0000 mg | ORAL_TABLET | Freq: Every day | ORAL | 0 refills | Status: AC
  Filled 2024-05-14: qty 30, 30d supply, fill #0

## 2024-05-14 MED ORDER — METFORMIN HCL 500 MG PO TABS
500.0000 mg | ORAL_TABLET | Freq: Two times a day (BID) | ORAL | 0 refills | Status: AC
  Filled 2024-05-14: qty 60, 30d supply, fill #0

## 2024-05-14 MED ORDER — MECLIZINE HCL 25 MG PO TABS
25.0000 mg | ORAL_TABLET | Freq: Three times a day (TID) | ORAL | 0 refills | Status: AC | PRN
  Filled 2024-05-14: qty 60, 20d supply, fill #0

## 2024-05-14 MED ORDER — DICLOFENAC SODIUM 1 % EX GEL
2.0000 g | Freq: Four times a day (QID) | CUTANEOUS | 0 refills | Status: AC
  Filled 2024-05-14: qty 100, 13d supply, fill #0

## 2024-05-14 MED ORDER — TIZANIDINE HCL 2 MG PO TABS
2.0000 mg | ORAL_TABLET | Freq: Three times a day (TID) | ORAL | 0 refills | Status: AC | PRN
  Filled 2024-05-14: qty 60, 20d supply, fill #0

## 2024-05-14 NOTE — Progress Notes (Signed)
 "                                                        PROGRESS NOTE   Subjective/Complaints:  No events overnight.  Right shoulder still somewhat bothersome but better with movement, no acute complaints. Labs are stable Vital stable.  ROS: as per HPI. Denies CP, SOB, abd pain, N/V/D/C, or any other complaints at this time.   + Right upper and lower extremity aching--improved  Objective:   No results found.   Recent Labs    05/14/24 0509  WBC 6.0  HGB 12.6*  HCT 37.9*  PLT 206    Recent Labs    05/14/24 0509  NA 139  K 4.0  CL 104  CO2 25  GLUCOSE 126*  BUN 10  CREATININE 0.94  CALCIUM  8.9     Intake/Output Summary (Last 24 hours) at 05/14/2024 1710 Last data filed at 05/14/2024 0500 Gross per 24 hour  Intake 240 ml  Output 200 ml  Net 40 ml        Physical Exam: Vital Signs Blood pressure 118/78, pulse (!) 58, temperature 97.7 F (36.5 C), temperature source Oral, resp. rate 16, height 5' 7 (1.702 m), weight 95.7 kg, SpO2 100%.  Physical Exam  Constitutional: No apparent distress. Appropriate appearance for age.  Bedside HENT: Status post left basal craniotomy, stable Eyes: PERRLA.  EOMI.  No obvious nystagmus.  Slight injection from dryness-improved, but pt rubbing them Cardiovascular: RRR, no murmurs/rub/gallops. No Edema. Peripheral pulses 2+  Respiratory: CTAB. No rales, rhonchi, or wheezing. On RA.  Abdomen: + bowel sounds, normoactive. No distention or tenderness.  Skin: Posterior surgical site well-approximated and healing--no swelling, erythema, or fluctuance.  MSK:      No apparent deformity.  Full active and passive range of motion right upper extremity, no clicking or popping.  No subluxation.  Neurologic exam:  Cognition: AAO to person, place, time and event.  + Slight cognitive delay--significantly improved 2-5 Language: Moderate dysarthria, with some speech delay --improving  Insight: Fair insight into current condition.  Mood:  Pleasant affect, appropriate mood.  Sensation: Reduced to light touch throughout the right hemibody CN:  + Right facial droop.   Coordination: Right upper and lower extremity ataxia, complicated by weakness Spasticity: MAS 1 right wrist flexors, finger flexors, elbow flexors, plantar flexors; MAS 1 right knee flexor       Strength:                RUE: 3+/5 SA, 3+/5 EF, 3+/5 EE, 4/5 WE, 4+/5 FF, 4/5 FA                 LUE:  5/5 SA, 5/5 EF, 5/5 EE, 5/5 WE, 5/5 FF, 5/5 FA                RLE: 3/5 HF, 4-/5 KE, 3/5  DF, 4/5  EHL, 4/5  PF                 LLE:  5/5 HF, 5/5 KE, 5/5  DF, 5/5  EHL, 5/5  PF   Physical exam unchanged from the above on reexamination 05/14/24    Assessment/Plan: 1. Functional deficits which require 3+ hours per day of interdisciplinary therapy in a comprehensive inpatient rehab setting. Physiatrist is providing close team  supervision and 24 hour management of active medical problems listed below. Physiatrist and rehab team continue to assess barriers to discharge/monitor patient progress toward functional and medical goals  Care Tool:  Bathing    Body parts bathed by patient: Right arm, Left arm, Chest, Abdomen, Front perineal area, Buttocks, Right upper leg, Left upper leg, Right lower leg, Left lower leg, Face   Body parts bathed by helper: Left arm, Right lower leg, Left lower leg, Buttocks     Bathing assist Assist Level: Supervision/Verbal cueing (Standing balance with use of grab bar)     Upper Body Dressing/Undressing Upper body dressing   What is the patient wearing?: Pull over shirt    Upper body assist Assist Level: Set up assist    Lower Body Dressing/Undressing Lower body dressing      What is the patient wearing?: Pants, Underwear/pull up     Lower body assist Assist for lower body dressing: Supervision/Verbal cueing     Toileting Toileting    Toileting assist Assist for toileting: Supervision/Verbal cueing     Transfers Chair/bed  transfer  Transfers assist     Chair/bed transfer assist level: Supervision/Verbal cueing     Locomotion Ambulation   Ambulation assist      Assist level: Supervision/Verbal cueing Assistive device: Walker-rolling Max distance: 175'   Walk 10 feet activity   Assist     Assist level: Supervision/Verbal cueing Assistive device: Walker-rolling   Walk 50 feet activity   Assist Walk 50 feet with 2 turns activity did not occur: Safety/medical concerns  Assist level: Supervision/Verbal cueing Assistive device: Walker-rolling    Walk 150 feet activity   Assist Walk 150 feet activity did not occur: Safety/medical concerns  Assist level: Supervision/Verbal cueing Assistive device: Walker-rolling    Walk 10 feet on uneven surface  activity   Assist Walk 10 feet on uneven surfaces activity did not occur: Safety/medical concerns   Assist level: Supervision/Verbal cueing Assistive device: Walker-rolling   Wheelchair     Assist Is the patient using a wheelchair?: No Type of Wheelchair: Manual    Wheelchair assist level: Maximal Assistance - Patient 25 - 49% Max wheelchair distance: 68'    Wheelchair 50 feet with 2 turns activity    Assist        Assist Level: Maximal Assistance - Patient 25 - 49%   Wheelchair 150 feet activity     Assist      Assist Level: Total Assistance - Patient < 25%   Blood pressure 118/78, pulse (!) 58, temperature 97.7 F (36.5 C), temperature source Oral, resp. rate 16, height 5' 7 (1.702 m), weight 95.7 kg, SpO2 100%.    Medical Problem List and Plan: 1. 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 Janjua             -patient may  shower with shower cap             -ELOS/Goals: 14-16d -CGA to Min A goals -DC date 05/12/24--> 05/15/24  - Stable to continue inpatient rehab  - 1/13: Going home with 2 daughters, one is CNA. Min to Mod A UB  and Max A LB ADLs; poor body orientation. Other evals pending.   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  He is understanding   - 1/20: Min A transfers and walker 130 ft yesterday; Mod- Max A today handheld assist due to fatigue. Min-Mod A UB care, Mod A LB care due to poor midline orientation and balance. Mild dysarthria and aprosity. Some attention/reasoning deficits c/b personality.   - 1-23: Discussed discharge date with patient, wife.  They have considerable concerns that he is not yet ambulatory, did discuss that he is expected to need some assistance at discharge and independent ambulation is not expected.  Answered all questions.  - 1/27: Family education tomorrow. Family very hesitant to bring him home without being functionally independent. SPV bed mobility, Min A RW, walked 540 ft Min-Mod A with RW yesterday - with significant loss of balance. Improving with midline orientation and lateropulsion. Setup-Min A UB care; doing better with standing, shower with CGA for standing balance today. RUE improving. SPV-Min A goals. Mild dysarthria but at goal level Mod I for communication. Mild cog deficits with complex attention c/b mental inflexibility - SPV, goal Mod I.   - Likely custom AFO needed closer to discharge--ordered 1/27 for eval later this week  - 1-28: A few episodes today of dysarthria and increased right sided weakness/coordination difficulty, with stable BMP and vitals, without other concerning features for seizure.  Discussed with Dr. [name], repeat CT head with small hygroma but no hydrocephalus or obstruction.  Orthostatic vital stable, labs stable.  Monitor closely.  2-3 Teams: SPV bed mobility, CGA transfers, walked 300  ft with RW CGA, stairs CGA, setup UBD, Min A LBD due to pain with reaching for R leg. Mod I complex reasoning.     2.  Antithrombotics: -DVT/anticoagulation:  Pharmaceutical: Heparin  5kU BID initiated 04/14/2024             -antiplatelet therapy: N/A  1-21: Duplex right lower extremity negative  2/3: DC DVT ppx, walking over 300 ft CGA  3. Pain Management/?complex migraines: Tylenol  as needed  -1-13 discussed positioning off of surgical site at nighttime.  Pain well-controlled on current regimen. - 1-14: Voltaren  gel 4 times daily to right ankle and calf.  Discussed bracing with therapies.  Add baclofen  5 mg 3 times daily for developing tightness/muscle aching on the right hemibody.  Consider right hip x-ray for arthritis if groin pain becomes worse.  1/15: Pain much improved with current regimen, monitor 1-17: Occipital nerve distribution of odd sensations across his head, not painful, does not want additional treatments at this time  -1/19 Will order Vas US  r/o DVT but think it is less likely--ultrasound negative  1-20: Increase baclofen  to 10 mg 3 times daily--improved pain tolerance  1-21: Increase nightly gabapentin  to 600 mg for calf and hand pain 1-22: Oversedated with current regimen; dropped gabapentin  back to 300 nightly, reduce baclofen  to 5 mg 3 times daily, add Robaxin  500 mg every 8 hours as needed for spasms--sedation improved 1-23 1-26: Still feeling cognitively slow, stop nightly gabapentin --no changes noted per patient 1-27 1-27: Daily AM headache, responsive to Tylenol , patient will be asking his wife to bring in pillows from home for better neck support.  05-07-27: Workup negative for etiologies of patient's lethargy/confusion; DC baclofen  and recheck this afternoon.  Note patient has been getting Voltaren  to right ankle and calf since 1-14, with minimal benefit; DC.  Add magnesium  supplement 250 mg nightly for cramping and repeat magnesium  levels in AM.  1-30: Lethargy improved  with DC  baclofen .  Increase nightly magnesium  supplement to 500 mg.  Increase Tylenol  to 1000 mg every 6 hours as needed.  2-2: Pain ongoing, but tolerable on current regimen.  2-3: Right shoulder pain today, may be due to overuse/strain.  Switch as needed Robaxin  to tizanidine .  Responding well to Voltaren  gel, continue Tylenol  as needed--symptoms improved 2-4  4. Mood/Behavior/Sleep: Provide emotional support             -antipsychotic agents: N/A   - sleeping well, apporpriate  5. Neuropsych/cognition: This patient is capable of making decisions on his own behalf.  - 1-27: Discussed cognitive deficits with patient, difficulty with attention.  Discussed starting neurostimulant to help with this; patient declines at this time.  1-28: Episode of confusion today, self-limited.  CT head read pending as above.  BMP stable.  Will check LFTs, ammonia, and CBC in AM.  Encourage p.o. fluid intake, repeating orthostatic vitals.  1-29: CT head as above, workup grossly negative.  Patient feeling slightly better today but still a little off.  DC baclofen , follow-up with patient this afternoon.  1-30: Patient feeling much better after DC baclofen .   6. Skin/Wound Care: Routine skin checks - 1-14:   will reach out to Dr. [name] regarding DC staples POD 10 tomorrow--OK, order placed 1/15 1-16: Some uneven edges on the surgical line in the lower portion, reinforced with Steri-Strips today.  No drainage or open wound bed, no concern for dehiscence or infection--looking much better  1-27: Skin site looking good, can wait for Steri-Strips to follow-up for removal if needed later this week  7. Fluids/Electrolytes/Nutrition: Routine and analysis with follow-up chemistries, continue vitamins/supplements   - labs stable on admission; follow up  1-29: P.o. intakes down over the last day or so as patient has not been feeling well.  Encouraged today.  Repeat labs in AM--stable 1-30, 2-2  8.  Diabetes mellitus.   Hemoglobin A1c 7.2.  Glucophage  500 mg twice daily, Lantus  insulin  8 units nightly - 1-18: Blood sugars well-controlled, have not resumed home Lantus .  Continue as needed insulin , using rarely, may be able to reduce blood glucose checks this week -1/19 well-controlled continue current regimen and monitor -1-23: Tolerating metformin , rare use of insulin  staying below 2 units.  Will monitor through the weekend  -05/02/24 CBGs looking great, would consider cutting back to BID checks if trend continues 1/26: No elevations >120; DC BG checks and insulin   - BG is well-controlled on labs since   9.  Hyperlipidemia.  Crestor  20mg  nightly  10.  Hypertension.  Norvasc  5 mg daily, Atenolol  50 mg daily, monitor with increased mobility -Blood pressure, vital stable.  Vitals:   05/10/24 1822 05/10/24 1955 05/11/24 0456 05/11/24 1954  BP: 122/72 126/80 110/71 119/73   05/12/24 0425 05/12/24 1313 05/12/24 1944 05/13/24 0407  BP: 121/66 129/66 (!) 124/58 109/70   05/13/24 1412 05/13/24 1930 05/14/24 0459 05/14/24 1411  BP: 104/69 135/73 123/67 118/78    11.  Class II obesity.  BMI 35.29.  Dietary follow-up  12.  Constipation/IBS.  MiraLAX  daily, Senokot 1 tablet nightly, colace 100mg  BID   - 1/12 LBM--may need laxatives increase tomorrow if no improvement  1-29: DC Colace, increase Senokot-S to 2 tabs twice daily  -05/10/24 LBM yesterday, monitor  13.  COPD.  History of tobacco use.  Monitor oxygen saturations.  Discussed no nicotine. 1-20: Patient to bring in portable CPAP machine from home; nursing informed to assess and set up--pending  14. Urinary urgency. Wean  off purwick.    - no incontinence, doing well  15. Left vision deficits.  Continue glasses taping, therapies.  - 1-17:  resolved today.  16.  Gastric reflux.  Resume Pepcid  40 mg daily, Protonix  40mg  nightly, Tums 3 times daily as needed   -Symptoms well-controlled on current regimen  17.  Stage IIIa CKD.  Baseline creatinine appears  1.1-1.4  -  Some fluctuations, but at baseline.  1-28/30: BMP stable, encouraging p.o. fluids  18. Eye itching/dryness: allergy eye drops added 05/10/24 for comfort, no injection noted, no drainage.   - 2-2: Improved with change to allergy drops, occurs intermittently at home, offered as needed Claritin but patient does not feel he needs it at this point  19. R 4-5th digit numbness. OT to get trough splint, encourage elbow extension to limit ulnar impingement.    LOS: 24 days A FACE TO FACE EVALUATION WAS PERFORMED  Joesph JAYSON Likes 05/14/2024, 5:10 PM     "

## 2024-05-14 NOTE — Progress Notes (Signed)
 Occupational Therapy Session Note  Patient Details  Name: Larry Dawson MRN: [id number] Date of Birth: 26-Aug-1955  Today's Date: 05/14/2024 OT Individual Time: 1005-1100 OT Individual Time Calculation (min): 55 min   Today's Date: 05/14/2024 OT Individual Time: 1450-1530 OT Individual Time Calculation (min): 40 min   Short Term Goals: Week 3:  OT Short Term Goal 1 (Week 3): STGs=LTGs due to ELOS.  Skilled Therapeutic Interventions/Progress Updates:  Session 1: [facility] for skilled OT session with focus on BADL retraining and discharge planning/preparation.   Pain: Pt with no reports of pain. OT offering intermediate rest breaks and positioning suggestions throughout session to address pain/fatigue and maximize participation/safety in session.   Functional Transfers: Pt completed ambulating transfer from WC<>bathroom utilizing RW with CGA progressing towards supervision.  Self Care Tasks: Pt completes the following self care tasks with levels of assistance noted below, UB: Pt completed UB bathing/dressing with supervision.  LB: Pt completed LB bathing utilizing a long-handled sponge for distal LE. Supervision + grab bar when standing to cleanse posterior periarea. Pt completed LB dressing with Min A for threading over toes/heel of RLE and CGA for standing to hike over bottom. Toileting: Pt completed toileting with set up/supervision while standing to cleanse posterior periarea. Pt required increased time due to difficulties having bowel movement. Pt continent of bowel.   Discharge Preparation: OT assessed pt's vision, coordination, and BUE ROM/strength in preparation for discharge. See discharge note  Pt remained in [facility] with 4Ps assessed and immediate needs met. Pt continues to be appropriate for skilled OT intervention to promote further functional independence in ADLs/IADLs.   Session 2: Pt greeted in [facility] for skilled OT session with focus on RUE/RLE NMR and d/c planning.    Pain: Pt reported discomfort in R triceps, in reference to elbow extension exercises. OT offering intermediate rest breaks and positioning suggestions throughout session to address pain/fatigue and maximize participation/safety in session. Pt educated on the importance of not pushing through pain with exercises upon d/c.  Functional Transfers: Pt completed ambulating transfer using RW with CGA, experiencing 1 LOB requiring Max A to regain balance and prevent fall.   Self Care:  Toileting: Pt required CGA to maintain standing, facing the toilet. Pt educated on the importance of keeping RW in front of him while standing to toilet, using straddling technique. Pt continent of bladder.  Therapeutic Exercise: Pt educated on the following UE stretches/exercises with visual demonstrations for completion upon d/c. Pt recommended to complete 8-10 repetitions with a 3-second hold, twice daily. Shoulder flexion [employer] stretch Internal/External rotation  Elbow flexion/extension  Supination/pronation Wrist flexion/extension  Radial/ulnar deviation  Pt also educated on the following LE exercises for completion upon d/c. Pt instructed to complete 5 repetitions of each exercise, twice daily.  Hip flexion/extension (stabilizing with RW) Hip ab/adduction (holding onto table) Side stepping (holding onto table) Squat<>Stands (chair placed posteriorly)  Pt remained in WC with 4Ps assessed and immediate needs met. Pt continues to be appropriate for skilled OT intervention to promote further functional independence in ADLs/IADLs.   Therapy Documentation Precautions:  Precautions Precautions: Fall Recall of Precautions/Restrictions: Intact Precaution/Restrictions Comments: R-hemiparesis/hemiplegia Required Braces or Orthoses: Other Brace Other Brace: AFO Restrictions Weight Bearing Restrictions Per Provider Order: No  Therapy/Group: Individual Therapy  Misty Pacini, OTS 05/14/2024, 12:51  PM

## 2024-05-14 NOTE — Progress Notes (Signed)
 Physical Therapy Discharge Summary  Patient Details  Name: Larry Dawson MRN: [id number] Date of Birth: Mar 21, 1956  Date of Discharge from PT service:May 14, 2024  Today's Date: 05/14/2024 PT Individual Time: 0804-0859 PT Individual Time Calculation (min): 55 min    Patient has met 9 of 9 long term goals due to improved activity tolerance, improved balance, improved postural control, and increased strength.  Patient to discharge at an ambulatory level Supervision.   Patient's care partner is independent to provide the necessary physical assistance at discharge.  Reasons goals not met: NA  Recommendation:  Patient will benefit from ongoing skilled PT services in outpatient setting to continue to advance safe functional mobility, address ongoing impairments in balance, ambulation, Rt sided NMR, and minimize fall risk.  Equipment: Rt AFO, Recommended RW with Rt hand splint  Reasons for discharge: treatment goals met and discharge from hospital  Patient/family agrees with progress made and goals achieved: Yes  Skilled Therapeutic Interventions: Pt received seated in [facility] and agrees to therapy. No complaint of pain. WC transport to gym for time management. Pt performs sit to stand with cues for initiation. Pt performs ramp navigatoin and car transfer with RW and Rt hand splint, with cues for safe AD management and positioning. Following rest break, pt completes x12 6 steps with bilateral handrails and cues for step sequencing and safety. Seated rest break. Pt stands and ambulates x175' with RW and Rt hand splint, with cues for upright posture and to increase Lt stride length to improve reciprocal gait pattern. Pt picks cone off ground with RW and using RUE. PT provides extensive education on safe mobility following discharge and performance of HEP. Pt left seated in WC with all needs within reach.   PT Discharge Precautions/Restrictions Precautions Precautions: Fall Recall of  Precautions/Restrictions: Intact Precaution/Restrictions Comments: R-hemiparesis/hemiplegia Required Braces or Orthoses: Other Brace Other Brace: AFO Restrictions Weight Bearing Restrictions Per Provider Order: No Pain Interference Pain Interference Pain Effect on Sleep: 1. Rarely or not at all Pain Interference with Therapy Activities: 1. Rarely or not at all Pain Interference with Day-to-Day Activities: 1. Rarely or not at all Vision/Perception  Vision - History Ability to See in Adequate Light: 1 Impaired Perception Perception: Impaired Preception Impairment Details: Spatial orientation;Inattention/Neglect Perception-Other Comments: Mild R sided inattention Praxis Praxis: Impaired Praxis Impairment Details: Motor planning Praxis-Other Comments: Improved since eval  Cognition Overall Cognitive Status: Within Functional Limits for tasks assessed Arousal/Alertness: Awake/alert Memory: Appears intact Awareness: Appears intact Problem Solving: Appears intact Safety/Judgment: Other (comment) Comments: Intermittently overestimates functional abilities Sensation Sensation Light Touch: Impaired Detail Peripheral sensation comments: Reports numbness in distal RUE/RLE Light Touch Impaired Details: Impaired RUE;Impaired LUE Hot/Cold: Not tested Proprioception: Impaired Detail Proprioception Impaired Details: Impaired RUE;Impaired RLE Stereognosis: Impaired Detail Stereognosis Impaired Details: Impaired RUE Coordination Gross Motor Movements are Fluid and Coordinated: No Fine Motor Movements are Fluid and Coordinated: No Coordination and Movement Description: 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  Mobility Bed Mobility Bed Mobility: Supine to Sit;Sit to  Supine Supine to Sit: Supervision/Verbal cueing Sit to Supine: Supervision/Verbal cueing Transfers Transfers: Sit to Stand;Stand to Sit;Squat Pivot Transfers Sit to Stand: Supervision/Verbal cueing Stand to Sit: Supervision/Verbal cueing Squat Pivot Transfers: Supervision/Verbal cueing Transfer (Assistive device): Rolling walker Locomotion  Gait Ambulation: Yes Gait Assistance: Supervision/Verbal cueing Gait Distance (Feet): 175 Feet Assistive device: Rolling walker Gait Assistance  Details: Verbal cues for gait pattern;Verbal cues for safe use of DME/AE;Verbal cues for technique Gait Gait: Yes Gait Pattern: Impaired Gait Pattern: Decreased stance time - right;Decreased step length - left;Trunk flexed Gait velocity: dec Stairs / Additional Locomotion Stairs: Yes Stairs Assistance: Supervision/Verbal cueing Stair Management Technique: Two rails Number of Stairs: 12 Height of Stairs: 6 Ramp: Supervision/Verbal cueing Curb: Supervision/Verbal cueing Wheelchair Mobility Wheelchair Mobility: No  Trunk/Postural Assessment  Cervical Assessment Cervical Assessment: Exceptions to [facility] Thoracic Assessment Thoracic Assessment: Exceptions to [facility] (rounded shoulders) Lumbar Assessment Lumbar Assessment: Exceptions to [facility] (posterior pelvic tilt)  Balance Balance Balance Assessed: Yes Static Sitting Balance Static Sitting - Balance Support: Feet supported Static Sitting - Level of Assistance: 6: Modified independent (Device/Increase time) Dynamic Sitting Balance Dynamic Sitting - Balance Support: Feet supported;During functional activity Dynamic Sitting - Level of Assistance: 5: Stand by assistance Static Standing Balance Static Standing - Balance Support: During functional activity;Bilateral upper extremity supported Static Standing - Level of Assistance: 5: Stand by assistance Dynamic Standing Balance Dynamic Standing - Balance Support: Bilateral upper extremity supported;During  functional activity Dynamic Standing - Level of Assistance: 5: Stand by assistance (Supervision-CGA) Extremity Assessment  RLE Assessment General Strength Comments: Grossly 3/5 LLE Assessment LLE Assessment: Within Functional Limits   [name], PT, DPT 05/14/2024, 4:59 PM

## 2024-05-14 NOTE — Plan of Care (Signed)
" °  Problem: RH BOWEL ELIMINATION Goal: RH STG MANAGE BOWEL WITH ASSISTANCE Description: STG Manage Bowel with supervision Assistance. Outcome: Progressing   Problem: RH BLADDER ELIMINATION Goal: RH STG MANAGE BLADDER WITH ASSISTANCE Description: STG Manage Bladder With supervision  Assistance Outcome: Progressing   Problem: RH SKIN INTEGRITY Goal: RH STG SKIN FREE OF INFECTION/BREAKDOWN Description: Skin free of infection with supervision assistance Outcome: Progressing   Problem: RH SAFETY Goal: RH STG ADHERE TO SAFETY PRECAUTIONS W/ASSISTANCE/DEVICE Description: STG Adhere to Safety Precautions With supervision Assistance/Device. Outcome: Progressing   Problem: RH PAIN MANAGEMENT Goal: RH STG PAIN MANAGED AT OR BELOW PT'S PAIN GOAL Description: <4 w/ prn Outcome: Progressing   "

## 2024-05-14 NOTE — Progress Notes (Addendum)
 Occupational Therapy Discharge Summary  Patient Details  Name: Larry Dawson MRN: [id number] Date of Birth: 11-26-1955  Date of Discharge from OT service:May 15, 2024  Patient has met 10 of 10 long term goals due to improved activity tolerance, improved balance, postural control, ability to compensate for deficits, functional use of  RIGHT upper and RIGHT lower extremity, improved attention, and improved awareness.  Patient to discharge at overall supervision.  Patient's care partner is independent to provide the necessary physical and cognitive assistance at discharge.    Reasons goals not met: n/a  Recommendation:  Patient will benefit from ongoing skilled OT services in outpatient setting to continue to advance functional skills in the area of BADL, iADL, and Reduce care partner burden.  Equipment: RW, TTB (recommended out-of-pocket purchase)  Reasons for discharge: treatment goals met and discharge from hospital  Patient/family agrees with progress made and goals achieved: Yes  OT Discharge Precautions/Restrictions  Precautions Precautions: Fall Precaution/Restrictions Comments: R-hemiparesis/hemiplegia Required Braces or Orthoses: Other Brace Other Brace: AFO Restrictions Weight Bearing Restrictions Per Provider Order: No ADL ADL Equipment Provided: Reacher, Long-handled sponge Eating: Set up Where Assessed-Eating: Chair, Wheelchair Grooming: Setup Where Assessed-Grooming: Sitting at sink Upper Body Bathing: Supervision/safety Where Assessed-Upper Body Bathing: Shower Lower Body Bathing: Setup, Supervision/safety Where Assessed-Lower Body Bathing: Shower Upper Body Dressing: Setup Where Assessed-Upper Body Dressing: Wheelchair, Edge of bed Lower Body Dressing: Supervision/safety Where Assessed-Lower Body Dressing: Edge of bed, Wheelchair Toileting: Supervision/safety Where Assessed-Toileting: Teacher, Adult Education: Close supervision Toilet Transfer Method:  Proofreader: Grab bars Tub/Shower Transfer: Scientific Laboratory Technician Method: Ship Broker: Emergency planning/management officer, Acupuncturist: Administrator, Arts Method: Designer, Industrial/product: Emergency planning/management officer, Grab bars Vision Baseline Vision/History: 1 Wears glasses (Bifocals) Patient Visual Report: Diplopia (Fluctuates, but improved since evaluation.) Vision Assessment?: Yes Eye Alignment: Within Functional Limits Ocular Range of Motion: Within Functional Limits Alignment/Gaze Preference: Within Defined Limits Tracking/Visual Pursuits: Decreased smoothness of eye movement to RIGHT superior field Saccades: Additional eye shifts occurred during testing Convergence: Impaired (comment) (Additional R eye shifts occur during testing) Visual Fields: No apparent deficits Diplopia Assessment: Disappears with one eye closed;Objects split side to side Depth Perception: Overshoots Perception  Perception: Impaired Perception-Other Comments: Mild R sided inattention Praxis Praxis: Impaired Praxis Impairment Details: Motor planning Praxis-Other Comments: Improved since eval Cognition Cognition Overall Cognitive Status: Within Functional Limits for tasks assessed Arousal/Alertness: Awake/alert Orientation Level: Person;Place;Situation Memory: Appears intact Selective Attention: Appears intact Awareness: Appears intact Awareness Impairment: Anticipatory impairment Problem Solving: Appears intact Safety/Judgment: Other (comment) Comments: Intermittently overestimates functional abilities Brief Interview for Mental Status (BIMS) Repetition of Three Words (First Attempt): 3 Temporal Orientation: Year: Correct Temporal Orientation: Month: Accurate within 5 days Temporal Orientation: Day: Correct Recall: Sock: Yes, no cue required Recall: Blue: Yes, no cue required Recall: Bed: Yes, no cue  required BIMS Summary Score: 15 Sensation Sensation Light Touch: Impaired Detail Peripheral sensation comments: Reports numbness in distal RUE/RLE Light Touch Impaired Details: Impaired RUE;Impaired LUE Hot/Cold: Not tested Proprioception: Impaired Detail Proprioception Impaired Details: Impaired RUE;Impaired RLE Stereognosis: Impaired Detail Stereognosis Impaired Details: Impaired RUE Coordination Gross Motor Movements are Fluid and Coordinated: No Fine Motor Movements are Fluid and Coordinated: No Coordination and Movement Description: 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 Mobility  Bed Mobility Bed Mobility: Supine to Sit;Sit to Supine Supine to Sit: Supervision/Verbal cueing  Sit to Supine: Supervision/Verbal cueing Transfers Sit to Stand: Supervision/Verbal cueing Stand to Sit: Supervision/Verbal cueing  Trunk/Postural Assessment  Cervical Assessment Cervical Assessment: Exceptions to [facility] Thoracic Assessment Thoracic Assessment: Exceptions to [facility] (rounded shoulders) Lumbar Assessment Lumbar Assessment: Exceptions to [facility] (posterior pelvic tilt) Postural Control Postural Control: Deficits on evaluation Trunk Control: Lateral lean Righting Reactions: Decreased/delayed Protective Responses: Decreased/delayed  Balance Balance Balance Assessed: Yes Static Sitting Balance Static Sitting - Balance Support: Feet supported Static Sitting - Level of Assistance: 6: Modified independent (Device/Increase time) Dynamic Sitting Balance Dynamic Sitting - Balance Support: Feet supported;During functional activity Dynamic Sitting - Level of Assistance: 5: Stand by assistance Static Standing Balance Static Standing - Balance Support: During functional activity;Bilateral upper extremity supported Static Standing - Level of Assistance:  5: Stand by assistance Dynamic Standing Balance Dynamic Standing - Balance Support: Bilateral upper extremity supported;During functional activity Dynamic Standing - Level of Assistance: 5: Stand by assistance (Supervision-CGA) Extremity/Trunk Assessment RUE Assessment RUE Assessment: Exceptions to [facility] Active Range of Motion (AROM) Comments: ~110 degrees of shoulder flexion/abduction (painful end range), WFL elbow, weak grasp General Strength Comments: 2-/5 RUE Body System: Neuro Brunstrum levels for arm and hand: Arm;Hand Brunstrum level for arm: Stage V Relative Independence from Synergy Brunstrum level for hand: Stage VI Isolated joint movements RUE Tone RUE Tone: Mild;Modified Ashworth Body Part - Modified Ashworth Scale: Elbow Elbow - Modified Ashworth Scale for Grading Hypertonia RUE: Slight increase in muscle tone, manifested by a catch and release or by minimal resistance at the end of the range of motion when the affected part(s) is moved in flexion or extension LUE Assessment LUE Assessment: Within Functional Limits  Misty Pacini, OTS 05/14/2024, 12:37 PM

## 2024-05-14 NOTE — Progress Notes (Signed)
 Speech Language Pathology Daily Session Note  Patient Details  Name: Larry Dawson MRN: [id number] Date of Birth: 12/17/1955  Today's Date: 05/14/2024 SLP Individual Time: 1330-1415 SLP Individual Time Calculation (min): 45 min  Short Term Goals: Week 2: SLP Short Term Goal 1 (Week 2): Patient will selectively attend to therapy tasks for 10 minutes given supervision assist. SLP Short Term Goal 2 (Week 2): Patient will solve complex functional problems @ modI SLP Short Term Goal 3 (Week 2): Patient will utilize SLOP speech intelligibility strategies at the conversation level @ modI  Skilled Therapeutic Interventions:   Pt greeted at bedside for tx targeting cognition and speech production. He was up in his WC upon SLP arrival, eager for tx tasks. SLP facilitated complex deductive reasoning task targeting attention to details, selective attention, problem solving, and reasoning. He completed the task @ modI and demonstrated adequate information processing time throughout. He also completed a structured verbal task (multiple meanings) requiring sentence(s) level responses. Throughout this and complex conversation, he independently maintained 100% intelligibility. He verbalized understanding of final education re progress thus far, generalization of cog and speech strategies, and d/c planning. At the end of tx tasks, he was left in his [facility] w/ the call light within reach. Pt is scheduled to d/c 2/6 - see d/c summary for more info.   Pain Pain Assessment Pain Scale: 0-10 Pain Score: 0-No pain  Therapy/Group: Individual Therapy  Larry Dawson 05/14/2024, 2:01 PM

## 2024-05-14 NOTE — Plan of Care (Signed)
" °  Problem: RH Balance Goal: LTG: Patient will maintain dynamic sitting balance (OT) Description: LTG:  Patient will maintain dynamic sitting balance with assistance during activities of daily living (OT) Outcome: Completed/Met   Problem: Sit to Stand Goal: LTG:  Patient will perform sit to stand in prep for activites of daily living with assistance level (OT) Description: LTG:  Patient will perform sit to stand in prep for activites of daily living with assistance level (OT) Outcome: Completed/Met   Problem: RH Bathing Goal: LTG Patient will bathe all body parts with assist levels (OT) Description: LTG: Patient will bathe all body parts with assist levels (OT) Outcome: Completed/Met   Problem: RH Dressing Goal: LTG Patient will perform upper body dressing (OT) Description: LTG Patient will perform upper body dressing with assist, with/without cues (OT). Outcome: Completed/Met Goal: LTG Patient will perform lower body dressing w/assist (OT) Description: LTG: Patient will perform lower body dressing with assist, with/without cues in positioning using equipment (OT) Outcome: Completed/Met   Problem: RH Toileting Goal: LTG Patient will perform toileting task (3/3 steps) with assistance level (OT) Description: LTG: Patient will perform toileting task (3/3 steps) with assistance level (OT)  Outcome: Completed/Met   Problem: RH Functional Use of Upper Extremity Goal: LTG Patient will use RT/LT upper extremity as a (OT) Description: LTG: Patient will use right/left upper extremity as a stabilizer/gross assist/diminished/nondominant/dominant level with assist, with/without cues during functional activity (OT) Outcome: Completed/Met   Problem: RH Toilet Transfers Goal: LTG Patient will perform toilet transfers w/assist (OT) Description: LTG: Patient will perform toilet transfers with assist, with/without cues using equipment (OT) Outcome: Completed/Met   Problem: RH Tub/Shower  Transfers Goal: LTG Patient will perform tub/shower transfers w/assist (OT) Description: LTG: Patient will perform tub/shower transfers with assist, with/without cues using equipment (OT) Outcome: Completed/Met   Problem: RH Memory Goal: LTG Patient will demonstrate ability for day to day recall/carry over during activities of daily living with assistance level (OT) Description: LTG:  Patient will demonstrate ability for day to day recall/carry over during activities of daily living with assistance level (OT). Outcome: Completed/Met   "

## 2024-05-15 ENCOUNTER — Other Ambulatory Visit (HOSPITAL_COMMUNITY): Payer: Self-pay

## 2024-05-15 NOTE — Progress Notes (Signed)
 Speech Language Pathology Discharge Summary  Patient Details  Name: Larry Dawson MRN: [id number] Date of Birth: 13-Nov-1955  Date of Discharge from SLP service:May 14, 2024   Patient has met 4 of 4 long term goals.  Patient to discharge at overall Modified Independent level.  Reasons goals not met: n/a   Clinical Impression/Discharge Summary:  Excellent progress noted this stay as evidenced by mastery of 4/4 LTGs. He demonstrates improved speech intelligibility, attention, awareness, and complex problem solving. He independently utilizes over articulation and slow rate to maintain 100% intelligibility in conversation. He completes complex cognitive tasks @ modI and demonstrates adequate cognitive-linguistic skills to return to PLOF w/o difficulty. Skilled ST not warranted upon d/c d/t adequate cognitive skills. Additionally, pt/family education complete.     Care Partner:  Caregiver Able to Provide Assistance: Yes  Type of Caregiver Assistance: Physical  Recommendation:  None      Equipment: n/a   Reasons for discharge: Treatment goals met;Discharged from hospital   Patient/Family Agrees with Progress Made and Goals Achieved: Yes    Larry Dawson 05/15/2024, 10:52 AM

## 2024-05-15 NOTE — Plan of Care (Signed)
" °  Problem: Consults Goal: RH STROKE PATIENT EDUCATION Description: See Patient Education module for education specifics  Outcome: Progressing   Problem: RH BOWEL ELIMINATION Goal: RH STG MANAGE BOWEL WITH ASSISTANCE Description: STG Manage Bowel with supervision Assistance. Outcome: Progressing   Problem: RH BLADDER ELIMINATION Goal: RH STG MANAGE BLADDER WITH ASSISTANCE Description: STG Manage Bladder With supervision  Assistance Outcome: Progressing   Problem: RH SKIN INTEGRITY Goal: RH STG SKIN FREE OF INFECTION/BREAKDOWN Description: Skin free of infection with supervision assistance Outcome: Progressing   Problem: RH SAFETY Goal: RH STG ADHERE TO SAFETY PRECAUTIONS W/ASSISTANCE/DEVICE Description: STG Adhere to Safety Precautions With supervision Assistance/Device. Outcome: Progressing   Problem: RH PAIN MANAGEMENT Goal: RH STG PAIN MANAGED AT OR BELOW PT'S PAIN GOAL Description: <4 w/ prn Outcome: Progressing   Problem: RH KNOWLEDGE DEFICIT Goal: RH STG INCREASE KNOWLEDGE OF DIABETES Description: Manage increase knowledge of diabetes with supervision assistance from wife using educational materials provided  Outcome: Progressing Goal: RH STG INCREASE KNOWLEDGE OF HYPERTENSION Description: Manage increase knowledge of hypertension with supervision assistance from wife using educational materials provided  Outcome: Progressing   "

## 2024-05-15 NOTE — Progress Notes (Signed)
 "                                                        PROGRESS NOTE   Subjective/Complaints:  No events overnight.   Vital stable.  Given DMV parking placard paperwork today.  Patient stable for discharge.  ROS: as per HPI. Denies CP, SOB, abd pain, N/V/D/C, or any other complaints at this time.   + Right upper and lower extremity aching--improved  Objective:   No results found.   Recent Labs    05/14/24 0509  WBC 6.0  HGB 12.6*  HCT 37.9*  PLT 206    Recent Labs    05/14/24 0509  NA 139  K 4.0  CL 104  CO2 25  GLUCOSE 126*  BUN 10  CREATININE 0.94  CALCIUM  8.9     Intake/Output Summary (Last 24 hours) at 05/15/2024 1000 Last data filed at 05/15/2024 0800 Gross per 24 hour  Intake 236 ml  Output 1025 ml  Net -789 ml        Physical Exam: Vital Signs Blood pressure 134/72, pulse 65, temperature 98.5 F (36.9 C), temperature source Oral, resp. rate 18, height 5' 7 (1.702 m), weight 95.7 kg, SpO2 99%.  Physical Exam  Constitutional: No apparent distress. Appropriate appearance for age.  Sitting up in bedside chair. HENT: Status post left basal craniotomy, stable Eyes: PERRLA.  EOMI.  Cardio: Regular rate and rhythm.  No murmurs, rubs, or gallops. Respiratory: CTAB. No rales, rhonchi, or wheezing. On RA.  Abdomen: + bowel sounds, normoactive. No distention or tenderness.  Skin: Posterior surgical site well-approximated and healing--no swelling, erythema, or fluctuance.  MSK:      No apparent deformity.  Full active and passive range of motion right upper extremity, no clicking or popping.  No subluxation.  Neurologic exam:  Cognition: AAO to person, place, time and event.  + Slight cognitive delay--significantly improved since inpatient rehab Language: Moderate dysarthria, with some speech delay --improving  Insight: Fair insight into current condition.  Mood: Pleasant affect, appropriate mood.  Sensation: Reduced to light touch throughout the  right hemibody CN:  + Right facial droop.--Improved Coordination: Right upper and lower extremity ataxia, complicated by weakness Spasticity: MAS 1 right wrist flexors, finger flexors, elbow flexors, plantar flexors; MAS 1 right knee flexor--stable 2-6       Strength:                RUE: 3+/5 SA, 3+/5 EF, 3+/5 EE, 4/5 WE, 4+/5 FF, 4/5 FA                 LUE:  5/5 SA, 5/5 EF, 5/5 EE, 5/5 WE, 5/5 FF, 5/5 FA                RLE: 3/5 HF, 4-/5 KE, 3/5  DF, 4/5  EHL, 4/5  PF                 LLE:  5/5 HF, 5/5 KE, 5/5  DF, 5/5  EHL, 5/5  PF     Assessment/Plan: 1. Functional deficits which require 3+ hours per day of interdisciplinary therapy in a comprehensive inpatient rehab setting. Physiatrist is providing close team supervision and 24 hour management of active medical problems listed below. Physiatrist and rehab team continue to assess barriers  to discharge/monitor patient progress toward functional and medical goals  Care Tool:  Bathing    Body parts bathed by patient: Right arm, Left arm, Chest, Abdomen, Front perineal area, Buttocks, Right upper leg, Left upper leg, Right lower leg, Left lower leg, Face   Body parts bathed by helper: Left arm, Right lower leg, Left lower leg, Buttocks     Bathing assist Assist Level: Supervision/Verbal cueing (Standing balance with use of grab bar)     Upper Body Dressing/Undressing Upper body dressing   What is the patient wearing?: Pull over shirt    Upper body assist Assist Level: Set up assist    Lower Body Dressing/Undressing Lower body dressing      What is the patient wearing?: Pants, Underwear/pull up     Lower body assist Assist for lower body dressing: Supervision/Verbal cueing     Toileting Toileting    Toileting assist Assist for toileting: Supervision/Verbal cueing     Transfers Chair/bed transfer  Transfers assist     Chair/bed transfer assist level: Supervision/Verbal cueing      Locomotion Ambulation   Ambulation assist      Assist level: Supervision/Verbal cueing Assistive device: Walker-rolling Max distance: 175'   Walk 10 feet activity   Assist     Assist level: Supervision/Verbal cueing Assistive device: Walker-rolling   Walk 50 feet activity   Assist Walk 50 feet with 2 turns activity did not occur: Safety/medical concerns  Assist level: Supervision/Verbal cueing Assistive device: Walker-rolling    Walk 150 feet activity   Assist Walk 150 feet activity did not occur: Safety/medical concerns  Assist level: Supervision/Verbal cueing Assistive device: Walker-rolling    Walk 10 feet on uneven surface  activity   Assist Walk 10 feet on uneven surfaces activity did not occur: Safety/medical concerns   Assist level: Supervision/Verbal cueing Assistive device: Walker-rolling   Wheelchair     Assist Is the patient using a wheelchair?: No Type of Wheelchair: Manual    Wheelchair assist level: Maximal Assistance - Patient 25 - 49% Max wheelchair distance: 9'    Wheelchair 50 feet with 2 turns activity    Assist        Assist Level: Maximal Assistance - Patient 25 - 49%   Wheelchair 150 feet activity     Assist      Assist Level: Total Assistance - Patient < 25%   Blood pressure 134/72, pulse 65, temperature 98.5 F (36.9 C), temperature source Oral, resp. rate 18, height 5' 7 (1.702 m), weight 95.7 kg, SpO2 99%.    Medical Problem List and Plan: 1. 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 Janjua             -patient may  shower with shower cap             -ELOS/Goals: 14-16d -CGA to Min A goals -DC date 05/12/24--> 05/15/24  - Stable to continue inpatient rehab  - 1/13: Going home with 2 daughters, one is CNA. Min to Mod A UB and Max A LB ADLs; poor body orientation. Other evals pending.   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  He is understanding   - 1/20:  Min A transfers and walker 130 ft yesterday; Mod- Max A today handheld assist due to fatigue. Min-Mod A UB care, Mod A LB care due to poor midline orientation and balance. Mild dysarthria and aprosity. Some attention/reasoning deficits c/b personality.   - 1-23: Discussed discharge date with patient, wife.  They have considerable concerns that he is not yet ambulatory, did discuss that he is expected to need some assistance at discharge and independent ambulation is not expected.  Answered all questions.  - 1/27: Family education tomorrow. Family very hesitant to bring him home without being functionally independent. SPV bed mobility, Min A RW, walked 540 ft Min-Mod A with RW yesterday - with significant loss of balance. Improving with midline orientation and lateropulsion. Setup-Min A UB care; doing better with standing, shower with CGA for standing balance today. RUE improving. SPV-Min A goals. Mild dysarthria but at goal level Mod I for communication. Mild cog deficits with complex attention c/b mental inflexibility - SPV, goal Mod I.   - Likely custom AFO needed closer to discharge--ordered 1/27 for eval later this week  - 1-28: A few episodes today of dysarthria and increased right sided weakness/coordination difficulty, with stable BMP and vitals, without other concerning features for seizure.  Discussed with Dr. [name], repeat CT head with small hygroma but no hydrocephalus or obstruction.  Orthostatic vital stable, labs stable.  Monitor closely.  2-3 Teams: SPV bed mobility, CGA transfers, walked 300 ft with RW CGA, stairs CGA, setup UBD, Min A LBD due to pain with reaching for R leg. Mod I complex  reasoning.    The patient is medically ready for discharge to home and will need follow-[facility] with [facility]. In addition, they will need to follow up with their PCP, Neurosurgery.    2.  Antithrombotics: -DVT/anticoagulation:  Pharmaceutical: Heparin  5kU BID initiated 04/14/2024             -antiplatelet therapy: N/A  1-21: Duplex right lower extremity negative  2/3: DC DVT ppx, walking over 300 ft CGA  3. Pain Management/?complex migraines: Tylenol  as needed  -1-13 discussed positioning off of surgical site at nighttime.  Pain well-controlled on current regimen. - 1-14: Voltaren  gel 4 times daily to right ankle and calf.  Discussed bracing with therapies.  Add baclofen  5 mg 3 times daily for developing tightness/muscle aching on the right hemibody.  Consider right hip x-ray for arthritis if groin pain becomes worse.  1/15: Pain much improved with current regimen, monitor 1-17: Occipital nerve distribution of odd sensations across his head, not painful, does not want additional treatments at this time  -1/19 Will order Vas US  r/o DVT but think it is less likely--ultrasound negative  1-20: Increase baclofen  to 10 mg 3 times daily--improved pain tolerance  1-21: Increase nightly gabapentin  to 600 mg for calf and hand pain 1-22: Oversedated with current regimen; dropped gabapentin  back to 300 nightly, reduce baclofen  to 5 mg 3 times daily, add Robaxin  500 mg every 8 hours as needed for spasms--sedation improved 1-23 1-26: Still feeling cognitively slow, stop nightly gabapentin --no changes noted per patient 1-27 1-27: Daily AM headache, responsive to Tylenol , patient will be asking his wife to bring in pillows from home for better neck support.  05-07-27: Workup negative for etiologies of patient's lethargy/confusion; DC baclofen  and recheck this afternoon.  Note patient has been getting Voltaren  to right ankle and calf since 1-14, with minimal benefit; DC.  Add magnesium  supplement 250 mg nightly for  cramping and repeat  magnesium  levels in AM.  1-30: Lethargy improved with DC baclofen .  Increase nightly magnesium  supplement to 500 mg.  Increase Tylenol  to 1000 mg every 6 hours as needed.  2-2: Pain ongoing, but tolerable on current regimen.  2-3: Right shoulder pain today, may be due to overuse/strain.  Switch as needed Robaxin  to tizanidine .  Responding well to Voltaren  gel, continue Tylenol  as needed--symptoms improved 2-4   - Tone in left upper and lower extremity seems to be improving, will evaluate as outpatient for Botox given poor tolerance of p.o. regimen  4. Mood/Behavior/Sleep: Provide emotional support             -antipsychotic agents: N/A   - sleeping well, apporpriate  5. Neuropsych/cognition: This patient is capable of making decisions on his own behalf.  - 1-27: Discussed cognitive deficits with patient, difficulty with attention.  Discussed starting neurostimulant to help with this; patient declines at this time.  1-28: Episode of confusion today, self-limited.  CT head read pending as above.  BMP stable.  Will check LFTs, ammonia, and CBC in AM.  Encourage p.o. fluid intake, repeating orthostatic vitals.  1-29: CT head as above, workup grossly negative.  Patient feeling slightly better today but still a little off.  DC baclofen , follow-up with patient this afternoon.  1-30: Patient feeling much better after DC baclofen .   6. Skin/Wound Care: Routine skin checks - 1-14:   will reach out to Dr. [name] regarding DC staples POD 10 tomorrow--OK, order placed 1/15 1-16: Some uneven edges on the surgical line in the lower portion, reinforced with Steri-Strips today.  No drainage or open wound bed, no concern for dehiscence or infection--looking much better  1-27: Skin site looking good, can wait for Steri-Strips to follow-up for removal if needed later this week  7. Fluids/Electrolytes/Nutrition: Routine and analysis with follow-up chemistries, continue vitamins/supplements   -  labs stable on admission; follow up  1-29: P.o. intakes down over the last day or so as patient has not been feeling well.  Encouraged today.  Repeat labs in AM--stable 1-30, 2-2  8.  Diabetes mellitus.  Hemoglobin A1c 7.2.  Glucophage  500 mg twice daily, Lantus  insulin  8 units nightly - 1-18: Blood sugars well-controlled, have not resumed home Lantus .  Continue as needed insulin , using rarely, may be able to reduce blood glucose checks this week -1/19 well-controlled continue current regimen and monitor -1-23: Tolerating metformin , rare use of insulin  staying below 2 units.  Will monitor through the weekend  -05/02/24 CBGs looking great, would consider cutting back to BID checks if trend continues 1/26: No elevations >120; DC BG checks and insulin   - BG is well-controlled on labs since   9.  Hyperlipidemia.  Crestor  20mg  nightly  10.  Hypertension.  Norvasc  5 mg daily, Atenolol  50 mg daily, monitor with increased mobility -Blood pressure, vital stable.  Vitals:   05/11/24 0456 05/11/24 1954 05/12/24 0425 05/12/24 1313  BP: 110/71 119/73 121/66 129/66   05/12/24 1944 05/13/24 0407 05/13/24 1412 05/13/24 1930  BP: (!) 124/58 109/70 104/69 135/73   05/14/24 0459 05/14/24 1411 05/14/24 1947 05/15/24 0617  BP: 123/67 118/78 133/65 134/72    11.  Class II obesity.  BMI 35.29.  Dietary follow-up  12.  Constipation/IBS.  MiraLAX  daily, Senokot 1 tablet nightly, colace 100mg  BID   - 1/12 LBM--may need laxatives increase tomorrow if no improvement  1-29: DC Colace, increase Senokot-S to 2 tabs twice daily  - LBM 2-4  13.  COPD.  History of tobacco use.  Monitor oxygen saturations.  Discussed no nicotine. 1-20: Patient to bring in portable CPAP machine from home; nursing informed to assess and set up--pending  14. Urinary urgency. Wean off purwick.    - no incontinence, doing well  15. Left vision deficits.  Continue glasses taping, therapies.  - 1-17:  resolved today.  16.  Gastric  reflux.  Resume Pepcid  40 mg daily, Protonix  40mg  nightly, Tums 3 times daily as needed   -Symptoms well-controlled on current regimen  17.  Stage IIIa CKD.  Baseline creatinine appears 1.1-1.4  -  Some fluctuations, but at baseline.  1-28/30: BMP stable, encouraging p.o. fluids  18. Eye itching/dryness: allergy eye drops added 05/10/24 for comfort, no injection noted, no drainage.   - 2-2: Improved with change to allergy drops, occurs intermittently at home, offered as needed Claritin but patient does not feel he needs it at this point  19. R 4-5th digit numbness. OT to get trough splint, encourage elbow extension to limit ulnar impingement.    LOS: 25 days A FACE TO FACE EVALUATION WAS PERFORMED  Joesph JAYSON Likes 05/15/2024, 10:00 AM     "

## 2024-05-15 NOTE — Plan of Care (Signed)
  Problem: RH Swallowing Goal: LTG Patient will consume least restrictive diet using compensatory strategies with assistance (SLP) Description: LTG:  Patient will consume least restrictive diet using compensatory strategies with assistance (SLP) Outcome: Completed/Met   Problem: RH Expression Communication Goal: LTG Patient will increase speech intelligibility (SLP) Description: LTG: Patient will increase speech intelligibility at word/phrase/conversation level with cues, % of the time (SLP) Outcome: Completed/Met   Problem: RH Problem Solving Goal: LTG Patient will demonstrate problem solving for (SLP) Description: LTG:  Patient will demonstrate problem solving for basic/complex daily situations with cues  (SLP) Outcome: Completed/Met   Problem: RH Attention Goal: LTG Patient will demonstrate this level of attention during functional activites (SLP) Description: LTG:  Patient will will demonstrate this level of attention during functional activites (SLP) Outcome: Completed/Met

## 2024-05-15 NOTE — Progress Notes (Signed)
 Inpatient Rehabilitation Care Coordinator Discharge Note   Patient Details  Name: Larry Dawson MRN: [id number] Date of Birth: 11-Aug-1955   Discharge location: d/c to home with his wife  Length of Stay: 24 days  Discharge activity level: Supervision  Home/community participation: Limited  Patient response un:Yzjouy Literacy - How often do you need to have someone help you when you read instructions, pamphlets, or other written material from your doctor or pharmacy?: Rarely  Patient response un:Dnrpjo Isolation - How often do you feel lonely or isolated from those around you?: Never  Services provided included: MD, RD, OT, PT, SLP, RN, TR, Pharmacy, Neuropsych, SW, CM  Financial Services:  [employer] Utilized: Private Insurance [employer]  Choices offered to/list presented to: patient and wife  Follow-up services arranged:  Outpatient, DME    Outpatient Servicies: [facility] for PT/OT/SLP DME : [facility] forRW    Patient response to transportation need: Is the patient able to respond to transportation needs?: Yes In the past 12 months, has lack of transportation kept you from medical appointments or from getting medications?: No In the past 12 months, has lack of transportation kept you from meetings, work, or from getting things needed for daily living?: No   Patient/Family verbalized understanding of follow-up arrangements:  Yes  Individual responsible for coordination of the follow-up plan: contact pt or pt dtr  Confirmed correct DME delivered: Graeme DELENA Jude 05/15/2024    Comments (or additional information):fam edu completed  Summary of Stay    Date/Time Discharge Planning CSW  05/12/24 1035 Pt will d/c to home with his wife as primary caregiver. PRN support from his daughters. Fam edu on Wed 9am-12pm with his wife- SW will confirm this will still occur due to weather. SW will confirm there are no barriers to discharge. AAC  05/05/24  1029 Pt will d/c to home with his wife as primary caregiver. PRN support from his daughters. Fam edu on Wed 9am-12pm with his wife- SW will confirm this will still occur due to weather. SW will confirm there are no barriers to discharge. AAC  04/28/24 1611 Pt will d/c to home with his wife as primary caregiver. PRN support from his daughters. SW will confirm there are no barriers to discharge. AAC  04/21/24 0956 TBA AAC       Markie Heffernan A Jude

## 2024-05-18 ENCOUNTER — Ambulatory Visit: Payer: Medicare Other

## 2024-07-07 ENCOUNTER — Ambulatory Visit: Admitting: Emergency Medicine
